# Patient Record
Sex: Female | Born: 1945 | Race: White | Hispanic: No | Marital: Married | Smoking: Former smoker
Health system: Southern US, Community
[De-identification: ages and names within clinical notes are randomized; demographics above are authoritative.]

## PROBLEM LIST (undated history)

## (undated) DIAGNOSIS — K219 Gastro-esophageal reflux disease without esophagitis: Secondary | ICD-10-CM

## (undated) DIAGNOSIS — G709 Myoneural disorder, unspecified: Secondary | ICD-10-CM

## (undated) DIAGNOSIS — I2510 Atherosclerotic heart disease of native coronary artery without angina pectoris: Secondary | ICD-10-CM

## (undated) DIAGNOSIS — J189 Pneumonia, unspecified organism: Secondary | ICD-10-CM

## (undated) DIAGNOSIS — M1990 Unspecified osteoarthritis, unspecified site: Secondary | ICD-10-CM

## (undated) DIAGNOSIS — J449 Chronic obstructive pulmonary disease, unspecified: Secondary | ICD-10-CM

## (undated) HISTORY — DX: Chronic obstructive pulmonary disease, unspecified: J44.9

## (undated) HISTORY — PX: CATARACT EXTRACTION: SUR2

## (undated) HISTORY — PX: APPENDECTOMY: SHX54

## (undated) HISTORY — PX: ABDOMINAL HYSTERECTOMY: SHX81

## (undated) HISTORY — DX: Unspecified osteoarthritis, unspecified site: M19.90

## (undated) HISTORY — PX: CHOLECYSTECTOMY: SHX55

---

## 1970-06-15 DIAGNOSIS — C801 Malignant (primary) neoplasm, unspecified: Secondary | ICD-10-CM

## 1970-06-15 HISTORY — DX: Malignant (primary) neoplasm, unspecified: C80.1

## 1997-06-15 HISTORY — PX: KNEE ARTHROSCOPY: SUR90

## 1998-02-12 ENCOUNTER — Emergency Department (HOSPITAL_COMMUNITY): Admission: EM | Admit: 1998-02-12 | Discharge: 1998-02-13 | Payer: Self-pay | Admitting: Emergency Medicine

## 1998-02-18 ENCOUNTER — Encounter: Admission: RE | Admit: 1998-02-18 | Discharge: 1998-02-18 | Payer: Self-pay | Admitting: *Deleted

## 1998-02-19 ENCOUNTER — Encounter: Admission: RE | Admit: 1998-02-19 | Discharge: 1998-05-20 | Payer: Self-pay | Admitting: Internal Medicine

## 1998-02-27 ENCOUNTER — Encounter: Payer: Self-pay | Admitting: Internal Medicine

## 1998-04-23 ENCOUNTER — Other Ambulatory Visit: Admission: RE | Admit: 1998-04-23 | Discharge: 1998-04-23 | Payer: Self-pay | Admitting: Obstetrics and Gynecology

## 1999-06-21 ENCOUNTER — Emergency Department (HOSPITAL_COMMUNITY): Admission: EM | Admit: 1999-06-21 | Discharge: 1999-06-21 | Payer: Self-pay | Admitting: Emergency Medicine

## 1999-06-21 ENCOUNTER — Encounter: Payer: Self-pay | Admitting: Emergency Medicine

## 1999-06-23 ENCOUNTER — Inpatient Hospital Stay (HOSPITAL_COMMUNITY): Admission: EM | Admit: 1999-06-23 | Discharge: 1999-06-26 | Payer: Self-pay | Admitting: Emergency Medicine

## 1999-06-23 ENCOUNTER — Encounter: Payer: Self-pay | Admitting: Emergency Medicine

## 1999-07-04 ENCOUNTER — Encounter: Admission: RE | Admit: 1999-07-04 | Discharge: 1999-07-04 | Payer: Self-pay | Admitting: Family Medicine

## 1999-10-13 ENCOUNTER — Encounter: Admission: RE | Admit: 1999-10-13 | Discharge: 1999-10-13 | Payer: Self-pay | Admitting: Family Medicine

## 1999-10-13 ENCOUNTER — Encounter: Payer: Self-pay | Admitting: Family Medicine

## 1999-10-20 ENCOUNTER — Encounter: Admission: RE | Admit: 1999-10-20 | Discharge: 1999-10-20 | Payer: Self-pay | Admitting: Family Medicine

## 1999-10-20 ENCOUNTER — Encounter: Payer: Self-pay | Admitting: Family Medicine

## 2000-07-19 ENCOUNTER — Inpatient Hospital Stay (HOSPITAL_COMMUNITY): Admission: EM | Admit: 2000-07-19 | Discharge: 2000-07-21 | Payer: Self-pay | Admitting: Emergency Medicine

## 2000-07-19 ENCOUNTER — Encounter: Payer: Self-pay | Admitting: Emergency Medicine

## 2000-07-21 ENCOUNTER — Encounter: Payer: Self-pay | Admitting: Emergency Medicine

## 2000-10-14 ENCOUNTER — Encounter: Payer: Self-pay | Admitting: Family Medicine

## 2000-10-14 ENCOUNTER — Encounter: Admission: RE | Admit: 2000-10-14 | Discharge: 2000-10-14 | Payer: Self-pay | Admitting: Family Medicine

## 2003-02-05 ENCOUNTER — Other Ambulatory Visit: Admission: RE | Admit: 2003-02-05 | Discharge: 2003-02-05 | Payer: Self-pay | Admitting: Obstetrics and Gynecology

## 2003-08-10 ENCOUNTER — Encounter (INDEPENDENT_AMBULATORY_CARE_PROVIDER_SITE_OTHER): Payer: Self-pay | Admitting: Specialist

## 2003-08-10 ENCOUNTER — Ambulatory Visit (HOSPITAL_COMMUNITY): Admission: RE | Admit: 2003-08-10 | Discharge: 2003-08-10 | Payer: Self-pay | Admitting: Internal Medicine

## 2004-05-09 ENCOUNTER — Ambulatory Visit: Payer: Self-pay | Admitting: Endocrinology

## 2005-03-03 ENCOUNTER — Ambulatory Visit: Payer: Self-pay | Admitting: Internal Medicine

## 2005-03-05 ENCOUNTER — Ambulatory Visit: Payer: Self-pay | Admitting: Cardiology

## 2005-03-09 ENCOUNTER — Ambulatory Visit: Payer: Self-pay

## 2005-03-10 ENCOUNTER — Ambulatory Visit: Payer: Self-pay | Admitting: Internal Medicine

## 2005-03-18 ENCOUNTER — Ambulatory Visit: Payer: Self-pay | Admitting: Internal Medicine

## 2005-04-22 ENCOUNTER — Ambulatory Visit: Payer: Self-pay | Admitting: Internal Medicine

## 2005-04-30 ENCOUNTER — Ambulatory Visit: Payer: Self-pay | Admitting: Internal Medicine

## 2005-04-30 ENCOUNTER — Inpatient Hospital Stay (HOSPITAL_COMMUNITY): Admission: EM | Admit: 2005-04-30 | Discharge: 2005-05-03 | Payer: Self-pay | Admitting: Emergency Medicine

## 2005-05-02 ENCOUNTER — Ambulatory Visit: Payer: Self-pay | Admitting: Internal Medicine

## 2005-05-18 ENCOUNTER — Ambulatory Visit: Payer: Self-pay | Admitting: Internal Medicine

## 2005-08-07 ENCOUNTER — Ambulatory Visit: Payer: Self-pay | Admitting: Internal Medicine

## 2006-02-08 ENCOUNTER — Ambulatory Visit (HOSPITAL_COMMUNITY): Admission: RE | Admit: 2006-02-08 | Discharge: 2006-02-08 | Payer: Self-pay | Admitting: Internal Medicine

## 2006-02-08 ENCOUNTER — Ambulatory Visit: Payer: Self-pay | Admitting: Internal Medicine

## 2006-02-10 ENCOUNTER — Ambulatory Visit: Payer: Self-pay | Admitting: Cardiology

## 2006-03-17 ENCOUNTER — Ambulatory Visit: Payer: Self-pay | Admitting: Internal Medicine

## 2006-03-23 ENCOUNTER — Ambulatory Visit: Payer: Self-pay | Admitting: Internal Medicine

## 2006-10-15 ENCOUNTER — Ambulatory Visit: Payer: Self-pay | Admitting: Internal Medicine

## 2006-12-23 ENCOUNTER — Emergency Department (HOSPITAL_COMMUNITY): Admission: EM | Admit: 2006-12-23 | Discharge: 2006-12-24 | Payer: Self-pay | Admitting: Emergency Medicine

## 2006-12-27 ENCOUNTER — Ambulatory Visit: Payer: Self-pay | Admitting: Internal Medicine

## 2007-01-11 DIAGNOSIS — M1990 Unspecified osteoarthritis, unspecified site: Secondary | ICD-10-CM | POA: Insufficient documentation

## 2007-04-06 ENCOUNTER — Ambulatory Visit: Payer: Self-pay | Admitting: Internal Medicine

## 2007-04-06 ENCOUNTER — Ambulatory Visit: Payer: Self-pay

## 2007-04-20 ENCOUNTER — Telehealth: Payer: Self-pay | Admitting: Internal Medicine

## 2007-05-09 ENCOUNTER — Encounter: Payer: Self-pay | Admitting: Internal Medicine

## 2007-08-11 ENCOUNTER — Ambulatory Visit: Payer: Self-pay | Admitting: Internal Medicine

## 2007-08-11 ENCOUNTER — Other Ambulatory Visit: Payer: Self-pay | Admitting: Internal Medicine

## 2007-08-11 ENCOUNTER — Inpatient Hospital Stay (HOSPITAL_COMMUNITY): Admission: AD | Admit: 2007-08-11 | Discharge: 2007-08-15 | Payer: Self-pay | Admitting: Internal Medicine

## 2007-08-11 ENCOUNTER — Ambulatory Visit: Payer: Self-pay | Admitting: Cardiology

## 2007-08-11 DIAGNOSIS — R51 Headache: Secondary | ICD-10-CM | POA: Insufficient documentation

## 2007-08-11 DIAGNOSIS — G039 Meningitis, unspecified: Secondary | ICD-10-CM | POA: Insufficient documentation

## 2007-08-11 DIAGNOSIS — K219 Gastro-esophageal reflux disease without esophagitis: Secondary | ICD-10-CM

## 2007-08-11 DIAGNOSIS — R519 Headache, unspecified: Secondary | ICD-10-CM | POA: Insufficient documentation

## 2007-08-25 ENCOUNTER — Ambulatory Visit: Payer: Self-pay | Admitting: Internal Medicine

## 2007-10-14 ENCOUNTER — Encounter: Payer: Self-pay | Admitting: Internal Medicine

## 2008-01-23 ENCOUNTER — Telehealth: Payer: Self-pay | Admitting: Internal Medicine

## 2008-01-23 ENCOUNTER — Ambulatory Visit: Payer: Self-pay | Admitting: Internal Medicine

## 2008-02-27 ENCOUNTER — Ambulatory Visit: Payer: Self-pay | Admitting: Internal Medicine

## 2008-02-27 ENCOUNTER — Encounter (INDEPENDENT_AMBULATORY_CARE_PROVIDER_SITE_OTHER): Payer: Self-pay | Admitting: *Deleted

## 2008-02-27 DIAGNOSIS — R062 Wheezing: Secondary | ICD-10-CM

## 2008-02-27 DIAGNOSIS — R5381 Other malaise: Secondary | ICD-10-CM

## 2008-02-27 DIAGNOSIS — R5383 Other fatigue: Secondary | ICD-10-CM

## 2008-02-27 DIAGNOSIS — E785 Hyperlipidemia, unspecified: Secondary | ICD-10-CM | POA: Insufficient documentation

## 2008-02-27 DIAGNOSIS — R252 Cramp and spasm: Secondary | ICD-10-CM

## 2008-02-27 LAB — CONVERTED CEMR LAB
Alkaline Phosphatase: 91 units/L (ref 39–117)
BUN: 12 mg/dL (ref 6–23)
Basophils Relative: 0.7 % (ref 0.0–3.0)
CO2: 29 meq/L (ref 19–32)
Chloride: 109 meq/L (ref 96–112)
Creatinine, Ser: 0.7 mg/dL (ref 0.4–1.2)
Eosinophils Absolute: 0.3 10*3/uL (ref 0.0–0.7)
Eosinophils Relative: 3.8 % (ref 0.0–5.0)
HDL: 38.2 mg/dL — ABNORMAL LOW (ref >39.0)
Hemoglobin: 15.2 g/dL — ABNORMAL HIGH (ref 12.0–15.0)
Lymphocytes Relative: 27.9 % (ref 12.0–46.0)
MCV: 85.6 fL (ref 78.0–100.0)
Neutro Abs: 5.8 10*3/uL (ref 1.4–7.7)
Neutrophils Relative %: 63.3 % (ref 43.0–77.0)
RBC: 5.05 M/uL (ref 3.87–5.11)
TSH: 3.06 microintl units/mL (ref 0.35–5.50)
Total Protein: 7.5 g/dL (ref 6.0–8.3)
WBC: 9.2 10*3/uL (ref 4.5–10.5)

## 2008-02-28 LAB — CONVERTED CEMR LAB: Vit D, 1,25-Dihydroxy: 30 (ref 30–89)

## 2008-03-08 ENCOUNTER — Ambulatory Visit: Payer: Self-pay | Admitting: Internal Medicine

## 2008-03-08 DIAGNOSIS — F17200 Nicotine dependence, unspecified, uncomplicated: Secondary | ICD-10-CM

## 2008-03-08 DIAGNOSIS — J209 Acute bronchitis, unspecified: Secondary | ICD-10-CM | POA: Insufficient documentation

## 2008-04-27 ENCOUNTER — Telehealth: Payer: Self-pay | Admitting: Internal Medicine

## 2008-05-02 ENCOUNTER — Encounter: Payer: Self-pay | Admitting: Internal Medicine

## 2008-08-01 ENCOUNTER — Ambulatory Visit: Payer: Self-pay | Admitting: Internal Medicine

## 2008-08-01 DIAGNOSIS — J019 Acute sinusitis, unspecified: Secondary | ICD-10-CM

## 2008-08-01 DIAGNOSIS — R21 Rash and other nonspecific skin eruption: Secondary | ICD-10-CM

## 2008-09-10 ENCOUNTER — Telehealth: Payer: Self-pay | Admitting: Internal Medicine

## 2009-07-26 ENCOUNTER — Ambulatory Visit: Payer: Self-pay | Admitting: Internal Medicine

## 2009-07-26 DIAGNOSIS — J449 Chronic obstructive pulmonary disease, unspecified: Secondary | ICD-10-CM

## 2009-07-31 ENCOUNTER — Telehealth: Payer: Self-pay | Admitting: Internal Medicine

## 2009-08-30 ENCOUNTER — Encounter (INDEPENDENT_AMBULATORY_CARE_PROVIDER_SITE_OTHER): Payer: Self-pay | Admitting: *Deleted

## 2010-01-31 ENCOUNTER — Telehealth: Payer: Self-pay | Admitting: Internal Medicine

## 2010-04-25 ENCOUNTER — Ambulatory Visit: Payer: Self-pay | Admitting: Internal Medicine

## 2010-04-25 DIAGNOSIS — R209 Unspecified disturbances of skin sensation: Secondary | ICD-10-CM | POA: Insufficient documentation

## 2010-05-11 ENCOUNTER — Emergency Department (HOSPITAL_COMMUNITY)
Admission: EM | Admit: 2010-05-11 | Discharge: 2010-05-11 | Payer: Self-pay | Source: Home / Self Care | Admitting: Family Medicine

## 2010-05-16 ENCOUNTER — Encounter: Admission: RE | Admit: 2010-05-16 | Discharge: 2010-05-16 | Payer: Self-pay | Admitting: Sports Medicine

## 2010-05-17 ENCOUNTER — Telehealth: Payer: Self-pay | Admitting: Family Medicine

## 2010-05-17 ENCOUNTER — Emergency Department (HOSPITAL_COMMUNITY)
Admission: EM | Admit: 2010-05-17 | Discharge: 2010-05-17 | Payer: Self-pay | Source: Home / Self Care | Admitting: Emergency Medicine

## 2010-05-19 ENCOUNTER — Telehealth: Payer: Self-pay | Admitting: Internal Medicine

## 2010-05-20 ENCOUNTER — Ambulatory Visit: Payer: Self-pay | Admitting: Internal Medicine

## 2010-05-20 LAB — CONVERTED CEMR LAB
ANA Titer 1: NEGATIVE
Anti Nuclear Antibody(ANA): POSITIVE — AB
Rhuematoid fact SerPl-aCnc: 21 intl units/mL — ABNORMAL HIGH (ref 0–20)

## 2010-05-21 LAB — CONVERTED CEMR LAB
AST: 23 units/L (ref 0–37)
Albumin: 4 g/dL (ref 3.5–5.2)
Alkaline Phosphatase: 119 units/L — ABNORMAL HIGH (ref 39–117)
Basophils Absolute: 0 10*3/uL (ref 0.0–0.1)
Basophils Relative: 0.5 % (ref 0.0–3.0)
Bilirubin, Direct: 0 mg/dL (ref 0.0–0.3)
Calcium: 9.6 mg/dL (ref 8.4–10.5)
GFR calc non Af Amer: 84.23 mL/min (ref >60.00)
Hemoglobin: 15 g/dL (ref 12.0–15.0)
Ketones, ur: NEGATIVE mg/dL
Leukocytes, UA: NEGATIVE
Lymphocytes Relative: 37.2 % (ref 12.0–46.0)
Monocytes Relative: 6.5 % (ref 3.0–12.0)
Neutro Abs: 4.4 10*3/uL (ref 1.4–7.7)
Neutrophils Relative %: 51.8 % (ref 43.0–77.0)
Nitrite: NEGATIVE
RBC: 4.98 M/uL (ref 3.87–5.11)
RDW: 13.4 % (ref 11.5–14.6)
Sodium: 137 meq/L (ref 135–145)
Specific Gravity, Urine: 1.025 (ref 1.000–1.030)
Uric Acid, Serum: 5 mg/dL (ref 2.4–7.0)
Urobilinogen, UA: 0.2 (ref 0.0–1.0)
pH: 5.5 (ref 5.0–8.0)

## 2010-05-23 ENCOUNTER — Ambulatory Visit: Payer: Self-pay | Admitting: Internal Medicine

## 2010-05-23 DIAGNOSIS — R61 Generalized hyperhidrosis: Secondary | ICD-10-CM

## 2010-05-28 ENCOUNTER — Ambulatory Visit: Payer: Self-pay | Admitting: Internal Medicine

## 2010-05-29 ENCOUNTER — Telehealth: Payer: Self-pay | Admitting: Internal Medicine

## 2010-05-29 LAB — CONVERTED CEMR LAB
BUN: 18 mg/dL (ref 6–23)
Basophils Absolute: 0 10*3/uL (ref 0.0–0.1)
CO2: 29 meq/L (ref 19–32)
Calcium: 9.4 mg/dL (ref 8.4–10.5)
Creatinine, Ser: 0.7 mg/dL (ref 0.4–1.2)
Eosinophils Absolute: 0.2 10*3/uL (ref 0.0–0.7)
GFR calc non Af Amer: 97.82 mL/min (ref >60.00)
Glucose, Bld: 90 mg/dL (ref 70–99)
Lymphocytes Relative: 32.9 % (ref 12.0–46.0)
MCHC: 34.6 g/dL (ref 30.0–36.0)
Monocytes Relative: 5.6 % (ref 3.0–12.0)
Neutro Abs: 5.9 10*3/uL (ref 1.4–7.7)
Platelets: 255 10*3/uL (ref 150.0–400.0)
RDW: 13.8 % (ref 11.5–14.6)
Sed Rate: 27 mm/hr — ABNORMAL HIGH (ref 0–22)

## 2010-06-02 ENCOUNTER — Ambulatory Visit: Payer: Self-pay | Admitting: Internal Medicine

## 2010-07-06 ENCOUNTER — Encounter: Payer: Self-pay | Admitting: Internal Medicine

## 2010-07-13 LAB — CONVERTED CEMR LAB
ALT: 20 units/L (ref 0–35)
Alkaline Phosphatase: 97 units/L (ref 39–117)
Basophils Relative: 0.6 % (ref 0.0–3.0)
Bilirubin, Direct: 0.1 mg/dL (ref 0.0–0.3)
Chloride: 108 meq/L (ref 96–112)
Creatinine, Ser: 0.7 mg/dL (ref 0.4–1.2)
Direct LDL: 127.8 mg/dL
Eosinophils Relative: 3.6 % (ref 0.0–5.0)
Leukocytes, UA: NEGATIVE
Lymphocytes Relative: 30.9 % (ref 12.0–46.0)
MCV: 89.4 fL (ref 78.0–100.0)
Monocytes Absolute: 0.6 10*3/uL (ref 0.1–1.0)
Neutrophils Relative %: 58.1 % (ref 43.0–77.0)
Nitrite: NEGATIVE
RBC: 4.5 M/uL (ref 3.87–5.11)
Sodium: 143 meq/L (ref 135–145)
Specific Gravity, Urine: <=1.005 (ref 1.000–1.030)
TSH: 3.77 microintl units/mL (ref 0.35–5.50)
Total Protein: 7.1 g/dL (ref 6.0–8.3)
WBC: 9.1 10*3/uL (ref 4.5–10.5)
pH: 5.5 (ref 5.0–8.0)

## 2010-07-14 ENCOUNTER — Telehealth: Payer: Self-pay | Admitting: Internal Medicine

## 2010-07-15 NOTE — Progress Notes (Signed)
Summary: RF  Phone Note Refill Request Call back at [phone]   Refills Requested: Medication #1:  PREMARIN 0.625 MG  TABS Take 1 tablet by mouth once a day  Medication #2:  AMBIEN 10 MG  TABS 1 po qhs as needed [facility]  Initial call taken by: Lamar Sprinkles, CMA,  January 31, 2010 1:36 PM  Follow-up for Phone Call        ok 3 ref on both Keep return office visit  Follow-up by: Tresa Garter MD,  January 31, 2010 2:17 PM    Prescriptions: PREMARIN 0.625 MG  TABS (ESTROGENS CONJUGATED) Take 1 tablet by mouth once a day  #30 Tablet x 3   Entered by:   Rock Nephew CMA   Authorized by:   Tresa Garter MD   Signed by:   Rock Nephew CMA on 01/31/2010   Method used:   Telephoned to ...       [facility] [address] [phone]* (retail)       [address]       [place], [place]  [address]       Ph: [phone] or [phone]       Fax: [phone]   RxID:   [id number] AMBIEN 10 MG  TABS (ZOLPIDEM TARTRATE) 1 po qhs as needed  #30 x 3   Entered by:   Rock Nephew CMA   Authorized by:   Tresa Garter MD   Signed by:   Rock Nephew CMA on 01/31/2010   Method used:   Telephoned to ...       [facility] [address] [phone]* (retail)       [address]       [place], [place]  [address]       Ph: [phone] or [phone]       Fax: [phone]   RxID:   [id number]

## 2010-07-15 NOTE — Progress Notes (Signed)
Summary: Test results  Phone Note Call from Patient Call back at [phone]   Caller: Patient Call For: Tresa Garter MD Summary of Call: Pt states she has chest xray &lab work 2/11 and has not rec'd results yet. Please advise. Initial call taken by: Verdell Face,  July 31, 2009 3:05 PM  Follow-up for Phone Call        Patient notified. Follow-up by: Lucious Groves,  July 31, 2009 3:15 PM
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
## 2010-07-15 NOTE — Progress Notes (Signed)
Summary: abnl MRI result  Phone Note Outgoing Call   Summary of Call: Call from [facility] at 1:45pm regarding abnl C spine MRI obtained for paresthesias/neck and arm pain x 2 wks as well as R leg pain - concern on imaging for prevertebral infection/edema, but no vertebral body/disc space association.  Spoke with patient, denies fevers/chills.  Does endorse feeling more tired recently.  Given possibility of deep space infection of back, recommended patient go to ER for further evaluation, likely blood cultures, admission for IV abx.  Called [facility] and spoke with Dr. Marisa Cyphers? advising him pt on her way. Initial call taken by: Eustaquio Boyden  MD,  May 17, 2010 3:05 PM       Allergies: 1)  ! Codeine Sulfate (Codeine Sulfate) 2)  ! Adult Aspirin Low Strength (Aspirin) 3)  ! Nabumetone (Nabumetone)    Appended Document: abnl MRI result reviewed Echart and ED physician record - pt seen and evaluated in ER, normal CBC. recommended admission for IV abx and ID consult, pt decided to leave AMA, desires to f/u with PCP. Will route to PCP.

## 2010-07-15 NOTE — Progress Notes (Signed)
Summary: IV ABX  Phone Note Call from Patient   Summary of Call: See phone note in EMR. Pt had blood cultures at the ER. She does not want to be admitted for IV antibiotics and pt is req that Dr Posey Rea set her up for in home IV antibiotics (don't think this is possible). Her reason for this request is that she says the hosptial is too stressful. Please advise - pt also req call back from MD.  Initial call taken by: Lamar Sprinkles, CMA,  May 19, 2010 11:00 AM  Follow-up for Phone Call        OV with any MD if sick  Follow-up by: Tresa Garter MD,  May 19, 2010 1:25 PM  Additional Follow-up for Phone Call Additional follow up Details #1::        Pt scheduled for office visit tomorrow w/Plot. Echart shows blood cultures neg as of now.  Additional Follow-up by: Lamar Sprinkles, CMA,  May 19, 2010 4:37 PM    Additional Follow-up for Phone Call Additional follow up Details #2::    ov today Follow-up by: Tresa Garter MD,  May 20, 2010 6:07 PM
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
## 2010-07-15 NOTE — Letter (Signed)
Summary: Previsit letter  [facility]  [address] [place], [place] [address]   Phone: [phone]  Fax: [phone]       08/30/2009 MRN: [id number]  [facility] [address] [place], [place]  [address]  Dear Ms. Brosnahan,  Welcome to the Gastroenterology Division at [facility].    You are scheduled to see a nurse for your pre-procedure visit on 09/27/2009 at 9:00AM on the 3rd floor at [facility], [address]  We ask that you try to arrive at our office 15 minutes prior to your appointment time to allow for check-in.  Your nurse visit will consist of discussing your medical and surgical history, your immediate family medical history, and your medications.    Please bring a complete list of all your medications or, if you prefer, bring the medication bottles and we will list them.  We will need to be aware of both prescribed and over the counter drugs.  We will need to know exact dosage information as well.  If you are on blood thinners (Coumadin, Plavix, Aggrenox, Ticlid, etc.) please call our office today/prior to your appointment, as we need to consult with your physician about holding your medication.   Please be prepared to read and sign documents such as consent forms, a financial agreement, and acknowledgement forms.  If necessary, and with your consent, a friend or relative is welcome to sit-in on the nurse visit with you.  Please bring your insurance card so that we may make a copy of it.  If your insurance requires a referral to see a specialist, please bring your referral form from your primary care physician.  No co-pay is required for this nurse visit.     If you cannot keep your appointment, please call [phone] to cancel or reschedule prior to your appointment date.  This allows [place] the opportunity to schedule an appointment for another patient in need of care.    Thank you for choosing [facility] for your  medical needs.  We appreciate the opportunity to care for you.  Please visit [place] at our website  to learn more about our practice.                     Sincerely.                                                                                                                   The Gastroenterology Division

## 2010-07-15 NOTE — Letter (Signed)
Summary: Referral - not able to see patient  [facility]  [address] [place], [place] [address]   Phone: [phone]  Fax: [phone]    August 30, 2009    Georgina Quint. Plotnikov, M.D. [address] [place], [place] [address]   ReCHANLEY MCENERY DOB:  Nov 04, 1945 MRN:   [id number]    Dear Dr. Posey Rea:  Thank you for your kind referral of the above patient.  We have attempted to schedule the recommended procedure Screening Colonoscopy but have not been able to schedule because:   X  The patient was not available by phone and/or has not returned our calls.  ___ The patient declined to schedule the procedure at this time.  We appreciate the referral and hope that we will have the opportunity to treat this patient in the future.    Sincerely,    [facility] [phone]
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
## 2010-07-17 NOTE — Progress Notes (Signed)
Summary: Rf Ambien  Phone Note Refill Request Message from:  Fax from Pharmacy  Refills Requested: Medication #1:  AMBIEN 10 MG  TABS 1 po qhs as needed   Dosage confirmed as above?Dosage Confirmed   Supply Requested: 30   Last Refilled: 04/29/2010 Was pt given written on 05-23-10?   Method Requested: Telephone to Pharmacy Next Appointment Scheduled: 06-02-10 Initial call taken by: Lanier Prude, [facility]),  May 29, 2010 8:07 AM  Follow-up for Phone Call        I don't remember  OK to ref Does she have Rheum and ID appts? Follow-up by: Tresa Garter MD,  May 29, 2010 12:47 PM  Additional Follow-up for Phone Call Additional follow up Details #1::        Rx called to pharmacy. PCCs are still waiting on appts with ID and rheumatology. pt informed  Additional Follow-up by: Lanier Prude, [facility]),  May 29, 2010 2:34 PM

## 2010-07-23 ENCOUNTER — Ambulatory Visit: Payer: Self-pay | Admitting: Infectious Disease

## 2010-07-23 NOTE — Progress Notes (Signed)
Summary: REFERRAL  Phone Note Call from Patient Call back at Home Phone [phone]   Summary of Call: Pt continues to c/o pain & numbness in hands & arms. She is req referral to ortho.  Initial call taken by: Lamar Sprinkles, CMA,  July 14, 2010 3:22 PM  Follow-up for Phone Call        She had a Rheum and ID ref - Did she go? Follow-up by: Tresa Garter MD,  July 14, 2010 6:16 PM  Additional Follow-up for Phone Call Additional follow up Details #1::        Checked on referral status - Pt told both ID & Rheumatology that she did not need the apt.    Who  said she does not need appts? Georgina Quint Nylee Barbuto MD  July 15, 2010 5:38 PM  Additional Follow-up by: Lamar Sprinkles, CMA,  July 15, 2010 2:16 PM    Additional Follow-up for Phone Call Additional follow up Details #2::    The patient told both that she did not need the apt. Left vm for pt that these were important and to please call our office as to why she did not make/keep these apts.................Marland KitchenLamar Sprinkles, CMA  July 15, 2010 6:41 PM   left vm same as above.....................Marland KitchenLamar Sprinkles, CMA  July 16, 2010 5:53 PM   Spoke w/pt. She declined apt's b/c she wanted to see if the meds given at office visit would work. She said she is willing to schedule w/both.  ID referral will attempt to call patient again. Rheumatology will need a new referral entered into EMR please...............Marland KitchenLamar Sprinkles, CMA  July 17, 2010 11:02 AM   Additional Follow-up for Phone Call Additional follow up Details #3:: Details for Additional Follow-up Action Taken: OK. Thank you!  Additional Follow-up by: Tresa Garter MD,  July 17, 2010 1:16 PM

## 2010-07-25 ENCOUNTER — Other Ambulatory Visit: Payer: Self-pay | Admitting: Infectious Disease

## 2010-07-25 ENCOUNTER — Encounter: Payer: Self-pay | Admitting: Infectious Disease

## 2010-07-25 ENCOUNTER — Ambulatory Visit (INDEPENDENT_AMBULATORY_CARE_PROVIDER_SITE_OTHER): Payer: PRIVATE HEALTH INSURANCE | Admitting: Infectious Disease

## 2010-07-25 DIAGNOSIS — R209 Unspecified disturbances of skin sensation: Secondary | ICD-10-CM

## 2010-07-25 DIAGNOSIS — M519 Unspecified thoracic, thoracolumbar and lumbosacral intervertebral disc disorder: Secondary | ICD-10-CM | POA: Insufficient documentation

## 2010-07-25 DIAGNOSIS — M4644 Discitis, unspecified, thoracic region: Secondary | ICD-10-CM

## 2010-07-25 DIAGNOSIS — M4640 Discitis, unspecified, site unspecified: Secondary | ICD-10-CM

## 2010-07-25 DIAGNOSIS — R61 Generalized hyperhidrosis: Secondary | ICD-10-CM

## 2010-07-25 DIAGNOSIS — M542 Cervicalgia: Secondary | ICD-10-CM

## 2010-07-25 LAB — CONVERTED CEMR LAB
BUN: 16 mg/dL (ref 6–23)
CO2: 27 meq/L (ref 19–32)
Creatinine, Ser: 0.67 mg/dL (ref 0.40–1.20)
Crypto Ag: NEGATIVE
Eosinophils Absolute: 0.3 10*3/uL (ref 0.0–0.7)
Eosinophils Relative: 4 % (ref 0–5)
Glucose, Bld: 97 mg/dL (ref 70–99)
HCT: 45 % (ref 36.0–46.0)
Hemoglobin: 15.1 g/dL — ABNORMAL HIGH (ref 12.0–15.0)
Lymphs Abs: 3.1 10*3/uL (ref 0.7–4.0)
MCV: 86.9 fL (ref 78.0–100.0)
Monocytes Relative: 8 % (ref 3–12)
RBC: 5.18 M/uL — ABNORMAL HIGH (ref 3.87–5.11)
Total Bilirubin: 0.3 mg/dL (ref 0.3–1.2)
WBC: 8.6 10*3/uL (ref 4.0–10.5)
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
## 2010-08-01 ENCOUNTER — Ambulatory Visit
Admission: RE | Admit: 2010-08-01 | Discharge: 2010-08-01 | Disposition: A | Discharge status: Home / Self Care | Payer: PRIVATE HEALTH INSURANCE | Source: Ambulatory Visit | Attending: Infectious Disease | Admitting: Infectious Disease

## 2010-08-01 DIAGNOSIS — M4644 Discitis, unspecified, thoracic region: Secondary | ICD-10-CM

## 2010-08-01 MED ORDER — GADOBENATE DIMEGLUMINE 529 MG/ML IV SOLN
11.0000 mL | Freq: Once | INTRAVENOUS | Status: AC | PRN
  Administered 2010-08-01: 11 mL via INTRAVENOUS

## 2010-08-08 ENCOUNTER — Other Ambulatory Visit (HOSPITAL_COMMUNITY): Payer: PRIVATE HEALTH INSURANCE

## 2010-08-23 ENCOUNTER — Encounter: Payer: Self-pay | Admitting: *Deleted

## 2010-08-26 LAB — DIFFERENTIAL
Basophils Relative: 0 % (ref 0–1)
Eosinophils Absolute: 0.2 10*3/uL (ref 0.0–0.7)
Eosinophils Relative: 3 % (ref 0–5)
Monocytes Relative: 7 % (ref 3–12)
Neutrophils Relative %: 55 % (ref 43–77)

## 2010-08-26 LAB — BASIC METABOLIC PANEL
Chloride: 102 mEq/L (ref 96–112)
GFR calc Af Amer: >60 mL/min (ref >60)
GFR calc non Af Amer: >60 mL/min (ref >60)
Potassium: 3.8 mEq/L (ref 3.5–5.1)
Sodium: 136 mEq/L (ref 135–145)

## 2010-08-26 LAB — CULTURE, BLOOD (ROUTINE X 2)
Culture  Setup Time: 201112040144
Culture: NO GROWTH

## 2010-08-26 LAB — CBC
HCT: 41.3 % (ref 36.0–46.0)
Hemoglobin: 14.2 g/dL (ref 12.0–15.0)
MCV: 86.8 fL (ref 78.0–100.0)
RBC: 4.76 MIL/uL (ref 3.87–5.11)
WBC: 7.2 10*3/uL (ref 4.0–10.5)

## 2010-09-17 ENCOUNTER — Other Ambulatory Visit: Payer: Self-pay | Admitting: Internal Medicine

## 2010-09-17 NOTE — Telephone Encounter (Signed)
Ok to rf? 

## 2010-09-22 NOTE — Telephone Encounter (Signed)
Pt left vm req Rf of ambien AND tramadol. Please advise.

## 2010-09-22 NOTE — Telephone Encounter (Signed)
OK to fill this prescription with additional refills x3 Thank you!  

## 2010-09-23 MED ORDER — ZOLPIDEM TARTRATE 10 MG PO TABS: 10.0000 mg | ORAL_TABLET | Freq: Every evening | ORAL | Status: DC | PRN

## 2010-09-23 NOTE — Telephone Encounter (Signed)
Ok to Rf? 

## 2010-09-23 NOTE — Telephone Encounter (Signed)
rf phoned in 

## 2010-10-31 NOTE — H&P (Signed)
[employer] [facility]  Patient:    Debra Farmer                 MRN: [id number] Adm. Date:  [id number] Attending:  McDiarmid, Leighton Roach. Dictator:   Wilfrid Lund, M.D. CC:         Maricela Bo, M.D.                         History and Physical  ADMISSION DIAGNOSIS:  Community-acquired pneumonia, oxygen requiring; hypoxia; failed outpatient treatment.  SERVICE:  Conservation officer, historic buildings.  PRIMARY CARE PHYSICIAN:  [facility]  PROBLEM LIST: 1. Community-acquired pneumonia/hypoxia. 2. Tobacco abuse. 3. Post menopausal. 4. Status post cholecystectomy. 5. Status post knee surgery. 6. Status post hysterectomy. 7. Status post appendectomy.  HISTORY OF PRESENT ILLNESS:  Patient was seen in [facility] ER two days ago and was diagnosed with a community-acquired pneumonia.  Patient was started at that time on a Z-pack, Tessalon pearls, and prednisone.  Since that time, patient has had no  improvement.  In fact, feels that she has worsened somewhat.  She has been feeling subjectively short of breath.  She reports an overall one-week history of cough, subjective fever, posttussive vomiting, and some diarrhea.  The cough is productive of a "mucousy" phlegm.  She has a sore throat and a chest "burning" ith coughing.  Also complains of generalized body aches.  REVIEW OF SYSTEMS:  Overall negative, except for occasional blurred vision and epigastric pain.  PAST MEDICAL HISTORY:  See problem list.  MEDICATIONS:  Premarin.  ALLERGIES:  CODEINE causes hallucinations.  SOCIAL HISTORY:  Smokes one-half a pack a day, quit four days ago.  No alcohol, no illicit drug use.  Lives in [place] with her husband.  PHYSICAL EXAMINATION:  VITAL SIGNS:  Temperature 98.5, blood pressure 100/56, respiratory rate 32, pulse oximetry 91-94% on 2 liters nasal cannula, pulse is 68.  GENERAL:  Patient is uncomfortable, in mild respiratory distress,  and coughing throughout exam.  HEENT:  Pupils equally round and reactive to light, extraocular muscles intact.  Tympanic membranes slightly injected bilaterally.  Throat is clear.  NECK:  No lymphadenopathy, no thyromegaly.  HEART:  Regular rate and rhythm without murmurs, rubs, or gallops.  LUNGS:  Rhonchi throughout lung fields bilaterally, crackles bilaterally throughout, and scattered expiratory wheezes.  ABDOMEN:  Nontender, good bowel sounds, slight tenderness to palpation in the epigastric region, likely muscular.  EXTREMITIES:  No clubbing, cyanosis, or edema.  NEUROLOGIC:  Nonfocal.  LABORATORY:  WBC of 12.3, hemoglobin 11.5, platelets 319, absolute neutrophil count of 10.5.  Sodium 138, potassium 3.6, chloride 106, CO2 27, BUN 11, creatinine 0.7, and glucose 159.  UA was negative.  Chest x-ray shows bilateral hilar pneumonia versus pneumonitis versus sarcoid.  ASSESSMENT AND PLAN:  A 65 year old Caucasian female with community-acquired pneumonia, hypoxia, status post failure of outpatient treatment.  1. Pneumonia.  As patient has failed outpatient treatment with Z-pack alone, will    start IV Rocephin and azithromycin to cover most community-acquired    pathogens.  Will continue patients prednisone burst to decrease the amount    of inflammation and reactive airway from this infection.  Patient will    receive albuterol nebulizers q.4h. as needed.  Will continue nasal cannula O2    to keep her O2 saturations above 93%.  Will hydrate patient with D5 one-half    normal saline, given subjective fevers at home  and in order to keep patient    well hydrated to keep secretions loose.  Will not use any codeine at current    time for cough suppressant, given previous bad reaction to that medication. 2. Tobacco abuse.  As patient has quit four days ago as a result of this illness,    stress to patient that these illnesses will become more frequent should she    continue  to smoke.  Patient voices understanding, and vows to cease tobacco    use.  States that she will not go back to smoking after this.  Encouraged her    in this effort. DD:  06/23/99 TD:  06/24/99 Job: [id number]

## 2010-10-31 NOTE — Discharge Summary (Signed)
NAMEJARELY, JUNCAJ             ACCOUNT NO.:  [id number]   MEDICAL RECORD NO.:  [id number]          PATIENT TYPE:  INP   LOCATION:  1522                         FACILITY:  [facility]   PHYSICIAN:  Rosalyn Gess. Norins, MD  DATE OF BIRTH:  March 26, 1946   DATE OF ADMISSION:  08/11/2007  DATE OF DISCHARGE:  08/15/2007                               DISCHARGE SUMMARY   ADMITTING DIAGNOSIS:  Meningitis.   DISCHARGE DIAGNOSIS:  Meningitis.   HISTORY OF PRESENT ILLNESS:  The patient presented to the office on the  day of admission with the acute onset of a headache, global in  distribution, rated as the worst headache of her life.  She had nausea,  chills, low grade fever.  She had no blurred vision, but she was dizzy.  She had no prior history of headache.  As an outpatient, CT scan of the  brain was performed which was read out as normal.  She then had an  outpatient lumbar puncture which revealed 131 WBCs per high-powered  field with 39% segs, 36% lymphs, 29% monos, 7 RBCs, CFS glucose was 58,  and protein was 95.  The patient was admitted for IV antibiotics for  treatment of possible bacterial meningitis.   Please see the admission note for past medical history, family history,  social history, and of exam.   HOSPITAL COURSE:  The patient was started on Rocephin initially with 2 g  IV then 1 g q. 24.  This was converted to oral Ceftin on February 28.  Over February 28, February 29 to February 28, March 1, to March 2, the  patient remained stable with resolution of her headache.  It was felt by  Dr. Thomos Lemons that this may actually be aseptic meningitis.  However,  it was felt to be prudent to continue her on oral antibiotics.  With the  patient's headache resolved, with her white count being normal at 7800  with no fever, she was stable for discharge home to complete an  outpatient course of Ceftin 250 mg b.i.d.   DISCHARGE EXAMINATION:  Temperature was 97, blood pressure 110/50, heart  rate 76, respirations 20, O2 saturations 97% on room air.  GENERAL APPEARANCE:  Awake, alert, in no distress.  CARDIOVASCULAR:  Unremarkable.  LUNGS:  Clear.  ABDOMEN:  Soft.  EXTREMITIES:  Without swelling, edema, and no deformities were noted.  NEUROLOGIC:  The patient awake, alert, oriented to person, place, time,  and context with no focal deficits.   FINAL LABORATORY:  Hemoglobin 11.9 g, white count 7800, platelet count  214,000.  Chemistries were unremarkable with potassium of 3.3,  creatinine 0.7, sodium of 140.  PCR, HSV was pending at time of  discharge dictation.   DISPOSITION:  The patient is discharged home.  She was to continue on:  1. Aspirin 81 mg daily  2. Toradol 10 mg t.i.d. p.r.n.  3. Ceftin 250 mg b.i.d. x3 additional days.   She was to see her primary care physician, Dr. Posey Rea, for followup  as needed.  The patient's condition at time of discharge dictation was  stable and improved  Rosalyn Gess Norins, MD  Electronically Signed     MEN/MEDQ  D:  09/09/2007  T:  09/10/2007  Job:  [id number]

## 2010-10-31 NOTE — Discharge Summary (Signed)
NAMEALEXIA, DINGER             ACCOUNT NO.:  [id number]   MEDICAL RECORD NO.:  [id number]          PATIENT TYPE:  INP   LOCATION:  5708                         FACILITY:  [facility]   PHYSICIAN:  Rene Paci, M.D. LHCDATE OF BIRTH:  Jun 25, 1945   DATE OF ADMISSION:  04/30/2005  DATE OF DISCHARGE:  05/03/2005                                 DISCHARGE SUMMARY   DISCHARGE DIAGNOSIS:  Community acquired pneumonia with chronic obstructive  pulmonary disease exacerbation.   HISTORY OF PRESENT ILLNESS:  Patient is a 65 year old female admitted with  mild hypoxia and possible bronchial pneumonia which was refractory to  outpatient treatment.  She was admitted for further evaluation and  treatment.   PAST MEDICAL HISTORY:  1.  Insomnia.  2.  COPD.  3.  Hyperlipidemia.   HOSPITAL COURSE:  COMMUNITY ACQUIRED PNEUMONIA WITH CHRONIC OBSTRUCTIVE  PULMONARY DISEASE EXACERBATION:  The patient was admitted and underwent a  chest x-ray which showed diffuse interstitial prominence and questionable  atypical infection.  She was started on IV Solu-Medrol which was later  changed to p.o. prednisone.  She was also treated with albuterol and  Atrovent nebulizers and IV Avelox.  The patient continued to improve and was  discharged to home on April 23, 2005.   DISCHARGE LABORATORY DATA:  Hemoglobin 12.5, hematocrit 36.8, white blood  cell count 16.4, BUN 11, creatinine 0.8.   DISCHARGE MEDICATIONS:  1.  Avelox 400 mg p.o. daily for six additional days.  2.  Tussionex syrup 1 teaspoon q.12h. p.r.n. severe cough.  3.  Prednisone 10 mg tablets six tablets on May 04, 2005, five tablets      on May 05, 2005, four tablets on May 06, 2005, three tablets      on May 07, 2005, two tablets on May 08, 2005, then one tablet      on May 09, 2005.  4.  DuoNeb nebulizer three times daily.  5.  Nexium, Premarin, aspirin and Ambien as prior to admission.   FOLLOW UP:  The patient  is instructed to follow up with Dr. Posey Rea in one  to two weeks and call for an appointment.      Melissa S. Peggyann Juba, NP      Rene Paci, M.D. [facility]  Electronically Signed    MSO/MEDQ  D:  06/25/2005  T:  06/26/2005  Job:  [id number]

## 2010-10-31 NOTE — Discharge Summary (Signed)
[place]  Patient:    Debra Farmer, Debra Farmer                MRN: [id number] Adm. Date:  [id number] Disc. Date: [id number] Attending:  Tobin Chad CC:         Burnell Blanks, M.D.   Discharge Summary  ATTENDING PHYSICIAN: Wayne A. Sheffield Slider, M.D.  PRIMARY CARE PHYSICIAN:  Burnell Blanks, M.D. of ___________ [facility].  CONSULTATIONS: 1. Cardiology.  Lori C. Earl Gala, R.N., A.N.P.  PROCEDURES: 1. Adenosine Cardiolite which was negative. 2. Chest CT in the emergency department which revealed resolving bronchitis. No evidence of pulmonary embolus.  There was a right middle lobe 4 mm nodule that was nonspecific, and they recommended follow up chest x-ray in six months.  DISCHARGE DIAGNOSES: 1. Atypical chest pain with no evidence of inducible ischemia. 2. Tobacco abuse. 3. Urinary tract infection. 4. Status post cholecystectomy. 5. Status post appendectomy. 6. Status post total abdominal hysterectomy.  DISCHARGE MEDICATIONS: 1. Premarin 0.625 mg p.o. q.d. 2. Bactrim DS  one tab p.o. b.i.d. x 3 days. 3. Aspirin 325 mg p.o. q.d.  HISTORY OF PRESENT ILLNESS:  Please refer to the admission history and physical for complete details.  In brief, this is a 65 year old Caucasian female who was at work when she had a sudden onset of left arm and substernal chest pain rating a 10/10 in severity, sharp, and stabbing.  The pain did radiate to her neck and brought her to her knees.  She did have nausea and diaphoresis along with shortness of breath but no vomiting or loss of consciousness.  She had no relenting or exacerbating factors, and it did resolve by arrival to the emergency department one hour later.  She has had similar symptoms on July 17, 2000 that lasted about 10 minutes and spontaneously resolved.  Her risk factors include tobacco abuse, post menopausal, history of hypercholesterolemia, and a family history of coronary artery  disease.  LABORATORY DATA: On admission included a BMT which was within normal limits. Sodium was 137, potassium 2.7, chloride 106, bicarbonate 26, BUN 13, creatinine 0.7, glucose 89.  CBC:  White count 10.9, Hemoglobin 15.1, hematocrit 43.5, platelet count 283,000, 54% segs, 39% lymphs, 4% monos. Initial cardiac enzymes, CK was 60 and MB 1.7, troponin less than 0.01.  Chest x-ray revealed no active disease.  Chest CT did reveal resolving edema versus vasculitis, versus bronchitis.  No evidence of PE  or DVT.  There was a right middle lobe, 4 mm nodule that was nonspecific and radiology recommended follow up with a chest x-ray in six months of this.  HOSPITAL COURSE:  A brief hospital course. #1. CHEST PAIN:  The patient was admitted to rule out myocardial infarction with serial enzymes and a repeat EKG.  She was started on aspirin, and she did rule out with three sets of negative enzymes.  Cardiology was consulted given her story, and they recommended risk stratifying the patient with an adenosine Cardiolite.  This was performed on the day of discharge, July 21, 2000, and was found to have no evidence of an inducible ischemia.  Cardiology therefore recommended no further follow up, and it was felt that the chest pain was atypical and could represent either musculoskeletal type pain versus an anxiety component.  #2. TOBACCO ABUSE:  This was discussed with the patient smoking cessation and how this does increase your risk of both coronary artery disease and several other types of cancer.  She did  decline any smoking cessation offers over the course of her hospital stay and continued to smoke.  #3. URINARY TRACT INFECTION:  On admission, thepatient on review of systems did have some complaints of dysuria, frequency, incomplete voids, and malodorous urine.  A urinalysis was obtained on the day of discharge and was sent for culture and sensitivity.  The results of which, at the time  of dictation are pending.  She was started empirically on Bactrim DS for three days.  She was given a prescription for the Bactrim to complete this course.  #4. HYPERCHOLESTEROLEMIA:  The patient did get a fasting lipid profile while in the hospital revealing total cholesterol 240, HDL 35, LDL 138, and triglycerides were 334.  Given her risk factors of smoking, post menopausal, and family history, although question family history is not early cardiac events, the patient would probably benefit from a antihyperlipidemic agent.  I did discuss this with her prior to discharge, and the patient opted to try lifestyle modifications until she is seen by her primary care physician, Dr. Gertha Calkin.  This is something that will need to be followed up.  I did discuss at length low cholesterol diet, including a diet high in fiber and fruits and vegetables along with increasing her daily exercise.  DISCHARGE CONDITION:  Good.  DISPOSITION:  The patient was discharged to home with her family.  ACTIVITY:  The patient was not given any restrictions on activity.  DIET:  She was encouraged to eat a low fat, low cholesterol diet high in fruits, vegetables, and fiber.  FOLLOWUP:  She was scheduled a follow up appointment with Dr. Val Riles at ___________  and [facility] on Wednesday August 04, 2000 at 10 oclock AM. DD:  07/21/00 TD:  07/22/00 Job: [id number]

## 2010-10-31 NOTE — Discharge Summary (Signed)
[place]  Patient:    Debra Farmer, Debra Farmer                  MRN: [id number] Adm. Date:  06/23/99 Disc. Date: 06/26/99 Attending:  Mena Pauls, M.D. Dictator:   Andrey Spearman, M.D. CC:         Burnell Blanks, M.D.                           Discharge Summary  DISCHARGE DIAGNOSES: 1.  Bilateral hilar pneumonia. 2.  Tobacco abuse.  DISCHARGE MEDICATIONS: 1.  Albuterol inhaler 2 puffs q.4h. p.r.n. 2.  Tessalon Perles 1 p.o. t.i.d. for cough. 3.  Premarin as prescribed previously. 4.  Zyban 150 mg q.a.m. x two days, then 150 mg p.o. b.i.d.  HISTORY OF PRESENT ILLNESS:  Please see the fully dictated History and Physical for details.  The patient is a 65 year old female with a past medical history only f prior surgeries including cholecystectomy, appendectomy, hysterectomy, knee surgery, and breast cyst removal, and a social history significant for half pack per day tobacco abuse since approximately age 19.  The patient presented with a one to two week history of cough, shortness of breath, subjective fever, and some vomiting and diarrhea which had resolved upon admission.  The patient had previously been seen in the emergency room and diagnosed as pneumonia, given a Z-pack, prednisone, and Albuterol inhaler; however, the patient had not improved over the two days of treatment and was increasingly short of breath.  HOSPITAL COURSE: #1 - BILATERAL HILAR PNEUMONIA:  The patients chest x-ray on admission showed a  bilateral hilar pneumonia versus pneumonitis versus sarcoid.  The patient was afebrile on admission; however, her lung exam revealed rhonchi throughout, crackles throughout, and scattered expiratory wheezes, and the patient was saturating 91 to 94% on 2 liters by nasal cannula.  The patient was admitted and was started on Rocephin and zithromycin IV.  The patient was continued on prednisone as with the patients history of  tobacco abuse there is a question of underlying reactive airway disease component.  The patient was also given Albuterol nebulizers q.4h. and continued on oxygen by nasal cannula.  She was also given Tessalon Perles for cough.  The patient tolerated a regular diet, was on IV antibiotics x 36 hours,  after which point IV Rocephin was discontinued and the patient was switched to .o. zithromycin.  The patient continued on Albuterol nebulizers q.4h.  Her oxygen was weaned.  On discharge she was on room air, saturating 93 to 94%.  The patient was ambulating without difficulty.  The patient was continued on prednisone for a full five day course and then the prednisone was discontinued.  The patient continued on Tessalon Perles for cough.  On discharge her lung exam revealed her lungs to be  clear to auscultation; however, the patient was still having excessive coughing. The patient also had peak flows done on this admission; pre-bronchodilator were  210, post-bronchodilator were 295.  I still question whether there is an underlying reactive airway disease component to this patients lung exam given her long smoking history.  I think doing pulmonary function tests after the acute pneumonia has resolved would be helpful.  #2 - TOBACCO ABUSE:  The patient had not been smoking since the onset of this illness and upon hospital admission had no desire to smoke.  She was counseled extensively regarding tobacco abuse.  She was agreeable  to starting Zyban and so Zyban was prescribed for her upon discharge.  She will need to be followed up for this as an outpatient.  DISCHARGE CONDITION:  Her condition at discharge was stable.  DISPOSITION:  She will follow up with Dr. Nathanial Rancher at [facility] on June 30, 1999 at 1:15 p.m.DD:  06/26/99 TD:  06/26/99 Job: [id number]

## 2010-10-31 NOTE — H&P (Signed)
Debra Farmer, Debra Farmer             ACCOUNT NO.:  [id number]   MEDICAL RECORD NO.:  [id number]          PATIENT TYPE:  EMS   LOCATION:  [facility]                         FACILITY:  [facility]   PHYSICIAN:  Georgina Quint. Plotnikov, M.D. [facility] OF BIRTH:  April 10, 1946   DATE OF ADMISSION:  04/30/2005  DATE OF DISCHARGE:                                HISTORY & PHYSICAL   CHIEF COMPLAINT:  Cough, shortness of breath, weakness.   HISTORY OF PRESENT ILLNESS:  The patient is a 65 year old female who  presents with complaint of severe cough, fever, shortness of breath of  several days' duration.  She saw me on April 29, 2005 and started on  Robitussin [facility] and doxycycline.  Not feeling any better.  If anything, she is  feeling worse.  Was worked in today.  Was found to have pulse oximetry  between 80-91% on room air.   ALLERGIES:  CODEINE.   CURRENT MEDICATIONS:  1.  Robitussin AC.  2.  Doxycycline.  3.  Premarin 0.625 mg daily.  4.  Nexium 40 mg daily.  5.  Baby aspirin.  6.  Ambien at h.s.   PAST MEDICAL HISTORY:  1.  Insomnia.  2.  COPD of unknown degree.  3.  Dyslipidemia.  4.  Recent chest pain with negative stress test.   SOCIAL HISTORY:  She works at [employer].  She smokes one pack a day.  No  alcohol.  She has three children.   FAMILY HISTORY:  No coronary artery disease.   REVIEW OF SYSTEMS:  No syncope.  Some chest pain still present.  Nauseated  with extreme cough.  Skin clear.  The rest is negative.   PHYSICAL EXAMINATION:  VITAL SIGNS:  Blood pressure 118/71, pulse 91,  temperature 99, temperature 243 pounds.  GENERAL:  She is in mild to moderate distress, appears sick, coughing all  the time.  HEENT:  Erythematous throat, slightly dry.  LUNGS:  Diffuse, extensive rhonchi.  HEART:  S1, S2, tachycardic.  ABDOMEN:  Soft, nontender.  EXTREMITIES:  Lower extremities without edema.  NEUROLOGIC:  She is alert, oriented, cooperative.  No meningeal signs.  SKIN:  Clear.   ASSESSMENT/PLAN:  1.  Possible bronchial pneumonia refractory to outpatient treatment.  Obtain      chest x-ray.  Discontinue doxycycline.  Start intravenous Levaquin 500      mg intravenous daily.  2.  Severe cough.  Will use Tussionex p.r.n.  3.  Hypoxemia due to problem #1.  Will use oxygen.  4.  Chronic obstructive pulmonary disease exacerbation.  Will use      intravenous Solu-Medrol.  5.  Deep venous thrombosis prophylaxis.  Stop Premarin.           ______________________________  Georgina Quint. Plotnikov, M.D. [facility]     AVP/MEDQ  D:  04/30/2005  T:  04/30/2005  Job:  [id number]

## 2010-12-26 ENCOUNTER — Ambulatory Visit (INDEPENDENT_AMBULATORY_CARE_PROVIDER_SITE_OTHER): Payer: PRIVATE HEALTH INSURANCE | Admitting: Internal Medicine

## 2010-12-26 ENCOUNTER — Encounter: Payer: Self-pay | Admitting: Internal Medicine

## 2010-12-26 ENCOUNTER — Telehealth: Payer: Self-pay | Admitting: *Deleted

## 2010-12-26 VITALS — BP 100/68 | HR 88 | Temp 97.5°F | Resp 16 | Ht 60.0 in | Wt 126.0 lb

## 2010-12-26 DIAGNOSIS — M25551 Pain in right hip: Secondary | ICD-10-CM | POA: Insufficient documentation

## 2010-12-26 DIAGNOSIS — M25552 Pain in left hip: Secondary | ICD-10-CM | POA: Insufficient documentation

## 2010-12-26 DIAGNOSIS — M25559 Pain in unspecified hip: Secondary | ICD-10-CM

## 2010-12-26 MED ORDER — METHYLPREDNISOLONE ACETATE 80 MG/ML IJ SUSP: 80.0000 mg | Freq: Once | INTRAMUSCULAR | Status: DC

## 2010-12-26 NOTE — Progress Notes (Signed)
  Subjective:    Patient ID: Debra Farmer, female    DOB: 1946-03-07, 65 y.o.   MRN: [id number]  HPI  C/o R hip pain x 2 wks - worse with sitting, mostly standing at work -- 11/10 in intensity. Irrad down to R outer thigh Review of Systems  Constitutional: Negative for fever.  Musculoskeletal: Positive for arthralgias (R hip) and gait problem. Negative for back pain.  Psychiatric/Behavioral: Positive for sleep disturbance (due to pain). The patient is not nervous/anxious.        Objective:   Physical Exam  Constitutional: She appears well-developed and well-nourished. No distress.  Musculoskeletal: She exhibits tenderness (R hip is very tender; R IT band is tender; LS is ok). She exhibits no edema.          Assessment & Plan:

## 2010-12-26 NOTE — Assessment & Plan Note (Addendum)
Procedure Note :     Procedure : Joint Injection,   R  hip   Indication: R Trochanteric bursitis with refractory  chronic pain.   Risks including unsuccessful procedure , bleeding, infection, bruising, skin atrophy and others were explained to the patient in detail as well as the benefits. Informed consent was obtained and signed.   Tthe patient was placed in a comfortable lateral decubitus position. The point of maximal tenderness was identified. Skin was prepped with Betadine and alcohol. Then, a 5 cc syringe with a 1.5 inch long 24-gauge needle was used for a bursa injection.. The needle was advanced  Into the bursa. I injected the bursa with 4 mL of 2% lidocaine and 80 mg of Depo-Medrol .  Band-Aid was applied.   Tolerated well. Complications: None. Good pain relief following the procedure.   Postprocedure instructions :    A Band-Aid should be left on for 12 hours. Injection therapy is not a cure itself. It is used in conjunction with other modalities. You can use nonsteroidal anti-inflammatories like ibuprofen , hot and cold compresses. Rest is recommended in the next 24 hours. You need to report immediately  if fever, chills or any signs of infection develop.

## 2010-12-26 NOTE — Telephone Encounter (Signed)
OK to fill both prescriptions with additional refills x5 Thank you!  

## 2010-12-26 NOTE — Patient Instructions (Signed)
[email or web address] -- IT band stretch, hip stretching Ice

## 2010-12-26 NOTE — Telephone Encounter (Signed)
Pt is req Rf on Ambien and Tramadol.  Ok To Rf?

## 2010-12-29 MED ORDER — TRAMADOL HCL 50 MG PO TABS: 50.0000 mg | ORAL_TABLET | Freq: Four times a day (QID) | ORAL | Status: DC | PRN

## 2010-12-29 MED ORDER — ZOLPIDEM TARTRATE 10 MG PO TABS: 10.0000 mg | ORAL_TABLET | Freq: Every evening | ORAL | Status: DC | PRN

## 2010-12-29 NOTE — Telephone Encounter (Signed)
Rf phoned in.  

## 2011-03-09 LAB — BASIC METABOLIC PANEL
Calcium: 8.2 — ABNORMAL LOW
Creatinine, Ser: 0.69
GFR calc Af Amer: >60
GFR calc non Af Amer: >60
Sodium: 140

## 2011-03-09 LAB — CSF CULTURE W GRAM STAIN: Culture: NO GROWTH

## 2011-03-09 LAB — CSF CELL COUNT WITH DIFFERENTIAL
Lymphs, CSF: 36 — ABNORMAL LOW
Monocyte-Macrophage-Spinal Fluid: 29
RBC Count, CSF: 7 — ABNORMAL HIGH
Segmented Neutrophils-CSF: 35 — ABNORMAL HIGH
Tube #: 3
WBC, CSF: 131 — ABNORMAL HIGH

## 2011-03-09 LAB — PROTEIN AND GLUCOSE, CSF: Glucose, CSF: 58

## 2011-03-09 LAB — CBC
Hemoglobin: 11.9 — ABNORMAL LOW
RBC: 3.98

## 2011-03-31 LAB — CBC
HCT: 39.2
Hemoglobin: 13.6
MCHC: 34.7
RBC: 4.64
RDW: 13.4

## 2011-03-31 LAB — DIFFERENTIAL
Basophils Absolute: 0
Eosinophils Relative: 3
Lymphocytes Relative: 35
Monocytes Absolute: 0.5
Monocytes Relative: 6
Neutro Abs: 4.6

## 2011-03-31 LAB — BASIC METABOLIC PANEL
CO2: 24
GFR calc Af Amer: >60
GFR calc non Af Amer: >60
Glucose, Bld: 110 — ABNORMAL HIGH
Potassium: 3.6
Sodium: 137

## 2011-06-11 ENCOUNTER — Other Ambulatory Visit: Payer: Self-pay | Admitting: Internal Medicine

## 2011-07-15 ENCOUNTER — Telehealth: Payer: Self-pay | Admitting: *Deleted

## 2011-07-15 MED ORDER — ZOLPIDEM TARTRATE 10 MG PO TABS: 10.0000 mg | ORAL_TABLET | Freq: Every evening | ORAL | Status: DC | PRN

## 2011-07-15 NOTE — Telephone Encounter (Signed)
Rf req for Zolpidem 10 mg 1 po qhs. Ok to Rf? 

## 2011-07-15 NOTE — Telephone Encounter (Signed)
OK to fill this prescription with additional refills x5 Thank you!  

## 2011-11-21 ENCOUNTER — Other Ambulatory Visit: Payer: Self-pay | Admitting: Internal Medicine

## 2011-11-23 ENCOUNTER — Telehealth: Payer: Self-pay | Admitting: *Deleted

## 2011-11-23 NOTE — Telephone Encounter (Signed)
Requested Medications     traMADol (ULTRAM) 50 MG tablet [Pharmacy Med Name: TRAMADOL HCL 50 MG TABLET]   TAKE 1 TO 2 TABLETS BY MOUTH EVERY 6 HOURS AS NEEDED FOR NECK AND BACK PAIN   Disp: 100 tablet R: 5 Start: 11/21/2011  Class: Normal   Requested on: 06/11/2011   Originally ordered on: 08/23/2010  Last refill: 11/05/2011

## 2011-11-23 NOTE — Telephone Encounter (Signed)
OK to fill this prescription with additional refills x1 Thank you!  

## 2011-11-24 MED ORDER — TRAMADOL HCL 50 MG PO TABS: 50.0000 mg | ORAL_TABLET | Freq: Four times a day (QID) | ORAL | Status: DC | PRN

## 2011-11-24 NOTE — Telephone Encounter (Signed)
Done

## 2011-12-25 ENCOUNTER — Other Ambulatory Visit: Payer: Self-pay | Admitting: Internal Medicine

## 2011-12-25 NOTE — Telephone Encounter (Signed)
Ok to Rf? 

## 2012-01-08 ENCOUNTER — Ambulatory Visit (INDEPENDENT_AMBULATORY_CARE_PROVIDER_SITE_OTHER): Payer: PRIVATE HEALTH INSURANCE | Admitting: Internal Medicine

## 2012-01-08 ENCOUNTER — Encounter: Payer: Self-pay | Admitting: Internal Medicine

## 2012-01-08 VITALS — BP 128/66 | HR 72 | Temp 97.4°F | Resp 16 | Wt 128.0 lb

## 2012-01-08 DIAGNOSIS — M25559 Pain in unspecified hip: Secondary | ICD-10-CM

## 2012-01-08 DIAGNOSIS — M25562 Pain in left knee: Secondary | ICD-10-CM | POA: Insufficient documentation

## 2012-01-08 DIAGNOSIS — G4700 Insomnia, unspecified: Secondary | ICD-10-CM

## 2012-01-08 DIAGNOSIS — M25569 Pain in unspecified knee: Secondary | ICD-10-CM

## 2012-01-08 DIAGNOSIS — M25561 Pain in right knee: Secondary | ICD-10-CM

## 2012-01-08 DIAGNOSIS — M25551 Pain in right hip: Secondary | ICD-10-CM

## 2012-01-08 MED ORDER — ZOLPIDEM TARTRATE 10 MG PO TABS: 10.0000 mg | ORAL_TABLET | Freq: Every evening | ORAL | Status: DC | PRN

## 2012-01-08 MED ORDER — MELOXICAM 7.5 MG PO TABS: 7.5000 mg | ORAL_TABLET | Freq: Every day | ORAL | Status: DC

## 2012-01-08 MED ORDER — METHYLPREDNISOLONE ACETATE 80 MG/ML IJ SUSP: 80.0000 mg | Freq: Once | INTRAMUSCULAR | Status: DC

## 2012-01-08 MED ORDER — TRAMADOL HCL 50 MG PO TABS: 50.0000 mg | ORAL_TABLET | Freq: Two times a day (BID) | ORAL | Status: DC | PRN

## 2012-01-08 NOTE — Assessment & Plan Note (Signed)
Continue with current prescription therapy as reflected on the Med list.  

## 2012-01-08 NOTE — Patient Instructions (Signed)
Postprocedure instructions :    A Band-Aid should be left on for 12 hours. Injection therapy is not a cure itself. It is used in conjunction with other modalities. You can use nonsteroidal anti-inflammatories like ibuprofen , hot and cold compresses. Rest is recommended in the next 24 hours. You need to report immediately  if fever, chills or any signs of infection develop. 

## 2012-01-08 NOTE — Assessment & Plan Note (Signed)
7/15 B - likely OA Meloxicam

## 2012-01-08 NOTE — Progress Notes (Signed)
Patient ID: Debra Farmer, female   DOB: 12-Feb-1946, 66 y.o.   MRN: [id number]  Subjective:    Patient ID: Debra Farmer, female    DOB: 10/23/1945, 66 y.o.   MRN: [id number]  Hip Pain  The incident occurred more than 1 week ago. There was no injury mechanism. The pain is present in the right hip. The quality of the pain is described as burning. The pain is at a severity of 9/10. The pain is severe. The pain has been constant since onset. She has tried ice and rest (tramadol) for the symptoms. The treatment provided mild relief.    C/o R hip pain x 1-2 wks - worse with sitting, mostly standing at work -- 9/10 in intensity. Irrad down to R outer thigh. C/o B knees are swollen and - hard to squat    C/o insomnia  She is retiring in Jan 2014  Review of Systems  Constitutional: Positive for fatigue. Negative for fever.  Musculoskeletal: Positive for arthralgias (R hip) and gait problem. Negative for back pain.  Psychiatric/Behavioral: Positive for disturbed wake/sleep cycle (due to pain). Negative for suicidal ideas and decreased concentration. The patient is not nervous/anxious.    Wt Readings from Last 3 Encounters:  01/08/12 128 lb (58.06 kg)  12/26/10 126 lb (57.153 kg)  07/25/10 131 lb (59.421 kg)   BP Readings from Last 3 Encounters:  01/08/12 128/66  12/26/10 100/68  07/25/10 111/70         Objective:   Physical Exam  Constitutional: She appears well-developed and well-nourished. No distress.  Cardiovascular: Normal rate.   No murmur heard. Pulmonary/Chest: She has no rales.  Musculoskeletal: She exhibits tenderness (R hip is very tender; R IT band is tender; LS is ok). She exhibits no edema.       B knees have grinding sound w/ROM  Skin: No erythema.  Psychiatric: Judgment normal.   Lab Results  Component Value Date   WBC 8.6 07/25/2010   HGB 15.1* 07/25/2010   HCT 45.0 07/25/2010   PLT 256 07/25/2010   GLUCOSE 97 07/25/2010   CHOL 210* 07/26/2009   TRIG  228.0* 07/26/2009   HDL 45.00 07/26/2009   LDLDIRECT 127.8 07/26/2009   ALT 10 07/25/2010   AST 15 07/25/2010   NA 137 07/25/2010   K 3.9 07/25/2010   CL 100 07/25/2010   CREATININE 0.67 07/25/2010   BUN 16 07/25/2010   CO2 27 07/25/2010   TSH 3.69 05/20/2010    Procedure Note :     Procedure : Joint Injection,  R  hip   Indication:  Trochanteric bursitis with refractory  chronic pain.   Risks including unsuccessful procedure , bleeding, infection, bruising, skin atrophy and others were explained to the patient in detail as well as the benefits. Informed consent was obtained and signed.   Tthe patient was placed in a comfortable lateral decubitus position. The point of maximal tenderness was identified. Skin was prepped with Betadine and alcohol. Then, a 5 cc syringe with a 1.5 inch long 25-gauge needle was used for a bursa injection.. The needle was advanced  Into the bursa. I injected the bursa with 4 mL of 2% lidocaine and 80 mg of Depo-Medrol .  Band-Aid was applied.   Tolerated well. Complications: None. Good pain relief following the procedure.           Assessment & Plan:

## 2012-01-08 NOTE — Assessment & Plan Note (Signed)
Options discussed Meloxicam Tramadol Will inject

## 2012-01-27 ENCOUNTER — Ambulatory Visit (INDEPENDENT_AMBULATORY_CARE_PROVIDER_SITE_OTHER): Payer: PRIVATE HEALTH INSURANCE | Admitting: Internal Medicine

## 2012-01-27 ENCOUNTER — Other Ambulatory Visit: Payer: Self-pay | Admitting: Internal Medicine

## 2012-01-27 ENCOUNTER — Other Ambulatory Visit (INDEPENDENT_AMBULATORY_CARE_PROVIDER_SITE_OTHER): Payer: PRIVATE HEALTH INSURANCE

## 2012-01-27 ENCOUNTER — Encounter: Payer: Self-pay | Admitting: Internal Medicine

## 2012-01-27 VITALS — BP 126/68 | HR 65 | Temp 97.6°F | Resp 15 | Wt 127.5 lb

## 2012-01-27 DIAGNOSIS — Z0000 Encounter for general adult medical examination without abnormal findings: Secondary | ICD-10-CM

## 2012-01-27 DIAGNOSIS — Z2911 Encounter for prophylactic immunotherapy for respiratory syncytial virus (RSV): Secondary | ICD-10-CM

## 2012-01-27 DIAGNOSIS — M25551 Pain in right hip: Secondary | ICD-10-CM

## 2012-01-27 DIAGNOSIS — Z1239 Encounter for other screening for malignant neoplasm of breast: Secondary | ICD-10-CM

## 2012-01-27 DIAGNOSIS — Z23 Encounter for immunization: Secondary | ICD-10-CM

## 2012-01-27 DIAGNOSIS — M25562 Pain in left knee: Secondary | ICD-10-CM

## 2012-01-27 DIAGNOSIS — F17200 Nicotine dependence, unspecified, uncomplicated: Secondary | ICD-10-CM

## 2012-01-27 DIAGNOSIS — M25569 Pain in unspecified knee: Secondary | ICD-10-CM

## 2012-01-27 DIAGNOSIS — M25559 Pain in unspecified hip: Secondary | ICD-10-CM

## 2012-01-27 DIAGNOSIS — Z1231 Encounter for screening mammogram for malignant neoplasm of breast: Secondary | ICD-10-CM

## 2012-01-27 LAB — CBC WITH DIFFERENTIAL/PLATELET
Basophils Absolute: 0 10*3/uL (ref 0.0–0.1)
Eosinophils Absolute: 0.2 10*3/uL (ref 0.0–0.7)
HCT: 49.7 % — ABNORMAL HIGH (ref 36.0–46.0)
Hemoglobin: 16.3 g/dL — ABNORMAL HIGH (ref 12.0–15.0)
Lymphs Abs: 2.9 10*3/uL (ref 0.7–4.0)
MCHC: 32.8 g/dL (ref 30.0–36.0)
Monocytes Absolute: 0.6 10*3/uL (ref 0.1–1.0)
Monocytes Relative: 6.9 % (ref 3.0–12.0)
Neutro Abs: 5.2 10*3/uL (ref 1.4–7.7)
Platelets: 255 10*3/uL (ref 150.0–400.0)
RDW: 13.7 % (ref 11.5–14.6)

## 2012-01-27 LAB — COMPREHENSIVE METABOLIC PANEL
ALT: 28 U/L (ref 0–35)
AST: 27 U/L (ref 0–37)
Alkaline Phosphatase: 122 U/L — ABNORMAL HIGH (ref 39–117)
Creatinine, Ser: 0.7 mg/dL (ref 0.4–1.2)
GFR: 93.96 mL/min (ref >60.00)
Sodium: 138 mEq/L (ref 135–145)
Total Bilirubin: 0.4 mg/dL (ref 0.3–1.2)
Total Protein: 7.8 g/dL (ref 6.0–8.3)

## 2012-01-27 LAB — URINALYSIS, ROUTINE W REFLEX MICROSCOPIC
Bilirubin Urine: NEGATIVE
Hgb urine dipstick: NEGATIVE
Nitrite: NEGATIVE
Total Protein, Urine: NEGATIVE
Urobilinogen, UA: 0.2 (ref 0.0–1.0)

## 2012-01-27 LAB — LIPID PANEL
HDL: 51.3 mg/dL (ref >39.00)
Total CHOL/HDL Ratio: 5
VLDL: 25.8 mg/dL (ref 0.0–40.0)

## 2012-01-27 MED ORDER — MELOXICAM 7.5 MG PO TABS: 7.5000 mg | ORAL_TABLET | Freq: Every day | ORAL | Status: DC

## 2012-01-27 NOTE — Assessment & Plan Note (Signed)
Discussed.

## 2012-01-27 NOTE — Assessment & Plan Note (Signed)
Better  

## 2012-01-27 NOTE — Assessment & Plan Note (Signed)
Ortho cons or a steroid inj was advised Restart Mobic

## 2012-01-27 NOTE — Progress Notes (Signed)
   Subjective:    Patient ID: Debra Farmer, female    DOB: [birth year], 66 y.o.   MRN: [id number]  HPI The patient is here for a wellness exam. The patient has been doing well overall without major physical or psychological issues going on lately. C/o R hip pain - much better after the shot. C/o R knee was swollen and - hard to squat    C/o insomnia - much better  She is retiring in Jan 2014  Review of Systems  Constitutional: Negative for fever and fatigue.  Musculoskeletal: Positive for arthralgias (R hip is NT, R knee w/decr ROM) and gait problem. Negative for back pain.  Psychiatric/Behavioral: Negative for suicidal ideas, disturbed wake/sleep cycle and decreased concentration. The patient is not nervous/anxious.    Wt Readings from Last 3 Encounters:  01/27/12 127 lb 8 oz (57.834 kg)  01/08/12 128 lb (58.06 kg)  12/26/10 126 lb (57.153 kg)   BP Readings from Last 3 Encounters:  01/27/12 126/68  01/08/12 128/66  12/26/10 100/68         Objective:   Physical Exam  Constitutional: She appears well-developed and well-nourished. No distress.  Cardiovascular: Normal rate.   No murmur heard. Pulmonary/Chest: She has no rales.  Musculoskeletal: She exhibits tenderness (R hip is not tender; R IT band is less tender; LS is ok. R knee is w/decr ROM). She exhibits no edema.       B knees have grinding sound w/ROM  Skin: No erythema.  Psychiatric: Judgment normal.   Lab Results  Component Value Date   WBC 8.6 07/25/2010   HGB 15.1* 07/25/2010   HCT 45.0 07/25/2010   PLT 256 07/25/2010   GLUCOSE 97 07/25/2010   CHOL 210* 07/26/2009   TRIG 228.0* 07/26/2009   HDL 45.00 07/26/2009   LDLDIRECT 127.8 07/26/2009   ALT 10 07/25/2010   AST 15 07/25/2010   NA 137 07/25/2010   K 3.9 07/25/2010   CL 100 07/25/2010   CREATININE 0.67 07/25/2010   BUN 16 07/25/2010   CO2 27 07/25/2010   TSH 3.69 05/20/2010             Assessment & Plan:

## 2012-01-27 NOTE — Assessment & Plan Note (Addendum)
We discussed age appropriate health related issues, including available/recomended screening tests and vaccinations. We discussed a need for adhering to healthy diet and exercise. Labs/EKG were reviewed/ordered. All questions were answered. She had a pneumovax Will give Zostavax Colon w/Dr Lawerance Bach ordered

## 2012-01-28 ENCOUNTER — Telehealth: Payer: Self-pay | Admitting: Internal Medicine

## 2012-01-28 NOTE — Addendum Note (Signed)
Addended by: Rock Nephew T on: 01/28/2012 11:34 AM   Modules accepted: Orders

## 2012-01-28 NOTE — Telephone Encounter (Signed)
Debra Farmer, please, inform patient that all labs are normal except for elev chol and elev hemoglobin. She needs to stop smoking Would it be ok to call in a chol med for her? Thx

## 2012-01-29 NOTE — Telephone Encounter (Signed)
Pt informed of lab results, pt states that she doesn't wish to start a cholesterol medication at this time, she will try to work on her cholesterol with her diet. (Lab results also mailed to pt per request)

## 2012-01-30 NOTE — Telephone Encounter (Signed)
Noted. Thx.

## 2012-02-24 ENCOUNTER — Telehealth: Payer: Self-pay | Admitting: Internal Medicine

## 2012-02-24 DIAGNOSIS — M25561 Pain in right knee: Secondary | ICD-10-CM

## 2012-02-24 DIAGNOSIS — M1990 Unspecified osteoarthritis, unspecified site: Secondary | ICD-10-CM

## 2012-02-24 NOTE — Telephone Encounter (Signed)
Referral request to see Dr Thomasena Edis with Debra Farmer, Rt knee pain. Seen Dr Posey Rea in the past for this

## 2012-02-26 NOTE — Telephone Encounter (Signed)
Pt informed

## 2012-02-26 NOTE — Telephone Encounter (Signed)
Order placed with [facility] 

## 2012-02-29 ENCOUNTER — Encounter: Payer: Self-pay | Admitting: Internal Medicine

## 2012-02-29 ENCOUNTER — Ambulatory Visit (INDEPENDENT_AMBULATORY_CARE_PROVIDER_SITE_OTHER): Payer: PRIVATE HEALTH INSURANCE | Admitting: Internal Medicine

## 2012-02-29 ENCOUNTER — Encounter: Payer: Self-pay | Admitting: General Practice

## 2012-02-29 VITALS — BP 128/70 | HR 65 | Temp 97.9°F | Resp 15 | Wt 128.5 lb

## 2012-02-29 DIAGNOSIS — M542 Cervicalgia: Secondary | ICD-10-CM

## 2012-02-29 DIAGNOSIS — M25559 Pain in unspecified hip: Secondary | ICD-10-CM

## 2012-02-29 DIAGNOSIS — M25551 Pain in right hip: Secondary | ICD-10-CM

## 2012-02-29 DIAGNOSIS — R209 Unspecified disturbances of skin sensation: Secondary | ICD-10-CM

## 2012-02-29 DIAGNOSIS — M79601 Pain in right arm: Secondary | ICD-10-CM

## 2012-02-29 DIAGNOSIS — M79609 Pain in unspecified limb: Secondary | ICD-10-CM

## 2012-02-29 MED ORDER — CYCLOBENZAPRINE HCL 5 MG PO TABS: 5.0000 mg | ORAL_TABLET | Freq: Three times a day (TID) | ORAL | Status: DC | PRN

## 2012-02-29 MED ORDER — GABAPENTIN 300 MG PO CAPS: 300.0000 mg | ORAL_CAPSULE | Freq: Three times a day (TID) | ORAL | Status: DC

## 2012-02-29 NOTE — Progress Notes (Signed)
  Subjective:    Patient ID: ILLA ENLOW, female    DOB: [birth year], 66 y.o.   MRN: [id number]  HPI  Complains of pain in right arm Associated with numbness and tingling sensation, question swollen Also associated with chronic neck tightness Symptoms not clearly positional, but worse at night Minimally improved with tramadol and ibuprofen therapy Denies falls or injury Request work note - "too much pain to work"  Past Medical History  Diagnosis Date  . Arthritis     Review of Systems  Constitutional: Positive for fatigue. Negative for fever.  Musculoskeletal: Positive for arthralgias. Negative for back pain.  Neurological: Negative for dizziness, tremors, seizures and speech difficulty.  Psychiatric/Behavioral: Negative for self-injury.       Objective:   Physical Exam BP 128/70  Pulse 65  Temp 97.9 F (36.6 C) (Oral)  Resp 15  Wt 128 lb 8 oz (58.287 kg)  SpO2 97% Constitutional: She appears well-developed and well-nourished. No distress. nontoxic HENT: Head: Normocephalic and atraumatic. Ears: B TMs ok, no erythema or effusion; Nose: Nose normal. Mouth/Throat: Oropharynx is clear and moist. No oropharyngeal exudate.  Eyes: Conjunctivae and EOM are normal. Pupils are equal, round, and reactive to light. No scleral icterus.  Neck: Normal range of motion. Neck supple. No JVD present. No thyromegaly present.  Cardiovascular: Normal rate, regular rhythm and normal heart sounds.  No murmur heard. trace BLE edema at ankles. Pulmonary/Chest: Effort normal and breath sounds normal. No respiratory distress. She has no wheezes.  Musculoskeletal: R shoulder, R elbow, wrist and fingers all with normal range of motion, no gross deformities.  Neurological: She is alert and oriented to person, place, and time. No cranial nerve deficit. Coordination, speech, balance and gait all normal.  Skin: Skin is warm and dry. No rash noted. No erythema.  Psychiatric: She has a normal mood and  affect. Her behavior is normal. Judgment and thought content normal.    Lab Results  Component Value Date   WBC 9.1 01/27/2012   HGB 16.3* 01/27/2012   HCT 49.7* 01/27/2012   PLT 255.0 01/27/2012   GLUCOSE 102* 01/27/2012   CHOL 269* 01/27/2012   TRIG 129.0 01/27/2012   HDL 51.30 01/27/2012   LDLDIRECT 194.4 01/27/2012   ALT 28 01/27/2012   AST 27 01/27/2012   NA 138 01/27/2012   K 5.2* 01/27/2012   CL 100 01/27/2012   CREATININE 0.7 01/27/2012   BUN 15 01/27/2012   CO2 30 01/27/2012   TSH 3.31 01/27/2012      Assessment & Plan:   RUE pain, ?radicular vs peripheral neuropathy reviewed 07/2011 MRI c-spine -  check NCS and start gabapentin 3 times a day plus Flexeril each bedtime follow up PCP Work note x 48h given  R knee pain, no effusion - chronic ortho refer pending

## 2012-02-29 NOTE — Patient Instructions (Signed)
It was good to see you today. we'll make referral for nerve conduction study to evaluate the source of your pain. Our office will contact you regarding appointment(s) once made. In the meanwhile, start gabapentin 1 capsule 3 times daily for pain and Flexeril as needed for neck pain Your prescription(s) have been submitted to your pharmacy. Please take as directed and contact our office if you believe you are having problem(s) with the medication(s). Okay to use these new medications with for tramadol and ibuprofen as needed Keep followup with orthopedics Dr. Thomasena Edis as referred - and call your doctor if symptoms worse or unimproved

## 2012-03-02 ENCOUNTER — Other Ambulatory Visit: Payer: Self-pay | Admitting: Internal Medicine

## 2012-03-02 NOTE — Telephone Encounter (Signed)
Ok to Rf? 

## 2012-03-03 ENCOUNTER — Telehealth: Payer: Self-pay | Admitting: Internal Medicine

## 2012-03-03 NOTE — Telephone Encounter (Signed)
Dr Felicity Coyer not available at this time, PCP is Dr Posey Rea  Note and chart reviewed  I dont think pt needs emergent evaluation, though should likely have repeat eval for hand pain and worsening numbness, ? Carpal tunnel or joint related pain, or even cervical nerve related pain - consider OV tomorrow, any MD ok

## 2012-03-03 NOTE — Telephone Encounter (Signed)
Called left message to call back 

## 2012-03-03 NOTE — Telephone Encounter (Signed)
Caller: Ramsie/Patient; Patient Name: Debra Farmer; PCP: Sonda Primes (Adults only); Best Callback Phone Number: [phone]; Reason for call: She has been having pain  in her  neck radiates down both arms, into hands. She has had ongoing discomfort but the  pain but suddenly  worsened on Saturday 02/27/12.  She states both hands were  pain "throbbing"  and swollen.  SHe is unable to get her wedding rings off.  She came to the office on Monday 02/29/12 and was evaluated . She states that her thumb, index finger and middle finger is numb.  She was prescribed a medication  Neurontin and Flexaril.  She takes the medication at night but cannot take the medication during the day due to sleepiness/sideeffects and the Neurontin makes her dizzy. She is able to move all extremities but is having problems working at her job. She states she is worse than when seen in office because now her thumb is numb.  Color, movement are all good, but ongoing swelling in hands R>L. Pain is 10/10 in bilateral hands.   Afebrile. No other symptoms. Voice clear . Emergent s/sx ruled out per Hand non-injury protocol with exceptionto "Severe pain with movement that limits normal activities". See Provider in 24 hours. Caller states she has to work and would like guidance from Dr. Trish Mage on what to do now that her thumb is number.  Advised I would forward her concerns about medication and worsening to Dr. Felicity Coyer . Caller expressed understanding. Advised she is at work and may not be able to answer the phone until 5p. Please leave message.

## 2012-03-04 ENCOUNTER — Ambulatory Visit (INDEPENDENT_AMBULATORY_CARE_PROVIDER_SITE_OTHER): Payer: PRIVATE HEALTH INSURANCE | Admitting: Internal Medicine

## 2012-03-04 ENCOUNTER — Ambulatory Visit (INDEPENDENT_AMBULATORY_CARE_PROVIDER_SITE_OTHER)
Admission: RE | Admit: 2012-03-04 | Discharge: 2012-03-04 | Disposition: A | Discharge status: Home / Self Care | Payer: PRIVATE HEALTH INSURANCE | Source: Ambulatory Visit | Attending: Internal Medicine | Admitting: Internal Medicine

## 2012-03-04 ENCOUNTER — Encounter: Payer: Self-pay | Admitting: Internal Medicine

## 2012-03-04 ENCOUNTER — Other Ambulatory Visit (INDEPENDENT_AMBULATORY_CARE_PROVIDER_SITE_OTHER): Payer: PRIVATE HEALTH INSURANCE

## 2012-03-04 VITALS — BP 100/60 | HR 76 | Temp 98.1°F | Resp 16 | Wt 129.0 lb

## 2012-03-04 DIAGNOSIS — F17200 Nicotine dependence, unspecified, uncomplicated: Secondary | ICD-10-CM

## 2012-03-04 DIAGNOSIS — M129 Arthropathy, unspecified: Secondary | ICD-10-CM

## 2012-03-04 DIAGNOSIS — M130 Polyarthritis, unspecified: Secondary | ICD-10-CM

## 2012-03-04 DIAGNOSIS — J449 Chronic obstructive pulmonary disease, unspecified: Secondary | ICD-10-CM

## 2012-03-04 MED ORDER — OXYCODONE-ACETAMINOPHEN 10-325 MG PO TABS: 1.0000 | ORAL_TABLET | Freq: Three times a day (TID) | ORAL | Status: DC | PRN

## 2012-03-04 MED ORDER — METHYLPREDNISOLONE ACETATE 80 MG/ML IJ SUSP
120.0000 mg | Freq: Once | INTRAMUSCULAR | Status: AC
  Administered 2012-03-04: 120 mg via INTRAMUSCULAR

## 2012-03-04 MED ORDER — INDOMETHACIN 50 MG PO CAPS: 50.0000 mg | ORAL_CAPSULE | Freq: Two times a day (BID) | ORAL | Status: DC

## 2012-03-04 NOTE — Progress Notes (Signed)
   Subjective:    Patient ID: Debra Farmer, female    DOB: 1946/01/30, 66 y.o.   MRN: [id number]  Hand Pain  The incident occurred 3 to 5 days ago (B hands started to hurt on Sat). There was no injury mechanism. The pain is at a severity of 9/10. The pain is severe. The symptoms are aggravated by movement. She has tried immobilization and ice for the symptoms. The treatment provided mild relief.    C/o R hip pain x 1-2 wks - worse with sitting, mostly standing at work -- 9/10 in intensity. Irrad down to R outer thigh. C/o B knees are swollen and - hard to squat    C/o insomnia  She is retiring in Jan 2014  Review of Systems  Constitutional: Positive for fatigue. Negative for fever.  Musculoskeletal: Positive for arthralgias (R hip) and gait problem. Negative for back pain.  Psychiatric/Behavioral: Positive for disturbed wake/sleep cycle (due to pain). Negative for suicidal ideas and decreased concentration. The patient is not nervous/anxious.    Wt Readings from Last 3 Encounters:  03/04/12 129 lb (58.514 kg)  02/29/12 128 lb 8 oz (58.287 kg)  01/27/12 127 lb 8 oz (57.834 kg)   BP Readings from Last 3 Encounters:  03/04/12 100/60  02/29/12 128/70  01/27/12 126/68         Objective:   Physical Exam  Constitutional: She appears well-developed and well-nourished. No distress.  Cardiovascular: Normal rate.   No murmur heard. Pulmonary/Chest: She has no rales.  Musculoskeletal: She exhibits tenderness (R hip is very tender; R IT band is tender; LS is ok). She exhibits no edema.       B hands are tender and swollen R>L  Skin: No erythema.  Psychiatric: Judgment normal.   Lab Results  Component Value Date   WBC 9.1 01/27/2012   HGB 16.3* 01/27/2012   HCT 49.7* 01/27/2012   PLT 255.0 01/27/2012   GLUCOSE 102* 01/27/2012   CHOL 269* 01/27/2012   TRIG 129.0 01/27/2012   HDL 51.30 01/27/2012   LDLDIRECT 194.4 01/27/2012   ALT 28 01/27/2012   AST 27 01/27/2012   NA 138 01/27/2012    K 5.2* 01/27/2012   CL 100 01/27/2012   CREATININE 0.7 01/27/2012   BUN 15 01/27/2012   CO2 30 01/27/2012   TSH 3.31 01/27/2012              Assessment & Plan:

## 2012-03-04 NOTE — Assessment & Plan Note (Signed)
Depomedrol Labs Indocin Oxy/apap prn

## 2012-03-04 NOTE — Telephone Encounter (Signed)
Can work her in today around 4:00 pm. Phete to inform pt.

## 2012-03-04 NOTE — Assessment & Plan Note (Signed)
CXR

## 2012-03-05 LAB — VITAMIN D 25 HYDROXY (VIT D DEFICIENCY, FRACTURES): Vit D, 25-Hydroxy: 49 ng/mL (ref 30–89)

## 2012-03-05 LAB — RHEUMATOID FACTOR: Rhuematoid fact SerPl-aCnc: <10 IU/mL (ref <=14)

## 2012-03-06 ENCOUNTER — Encounter: Payer: Self-pay | Admitting: Internal Medicine

## 2012-03-06 NOTE — Assessment & Plan Note (Signed)
Discussed.

## 2012-03-07 ENCOUNTER — Telehealth: Payer: Self-pay | Admitting: Internal Medicine

## 2012-03-07 LAB — ANTI-NUCLEAR AB-TITER (ANA TITER): ANA Titer 1: NEGATIVE

## 2012-03-07 LAB — TSH: TSH: 4.37 u[IU]/mL (ref 0.35–5.50)

## 2012-03-07 LAB — SEDIMENTATION RATE: Sed Rate: 27 mm/hr — ABNORMAL HIGH (ref 0–22)

## 2012-03-07 MED ORDER — TRAMADOL HCL 50 MG PO TABS: 50.0000 mg | ORAL_TABLET | Freq: Two times a day (BID) | ORAL | Status: DC | PRN

## 2012-03-07 NOTE — Telephone Encounter (Signed)
Caller: [place]/Patient; Patient Name: Debra Farmer; PCP: Sonda Primes (Adults only); Best Callback Phone Number: [phone]. Caller reports she was seen in the office last Friday, 9/20 and was given Percocet for pain. Caller asking if she can instead get a refill on her Tramadol. Caller reports the Percocet is too strong for her and she would like Tramadol instead. [facility] [address]. Caller advised a note will be sent for PCP re same. She can be reached at above number as needed.

## 2012-03-07 NOTE — Telephone Encounter (Signed)
OK - bring Percocet Rx here to discard and get a rx for Tramadol Thx

## 2012-03-07 NOTE — Telephone Encounter (Signed)
Debra Farmer, please, inform patient that OA Thx

## 2012-03-08 NOTE — Telephone Encounter (Signed)
Pt informed of below. Tramadol Rx upfront.

## 2012-03-21 ENCOUNTER — Ambulatory Visit: Payer: PRIVATE HEALTH INSURANCE | Admitting: Internal Medicine

## 2012-03-25 ENCOUNTER — Encounter: Payer: Self-pay | Admitting: Internal Medicine

## 2012-04-11 ENCOUNTER — Telehealth: Payer: Self-pay | Admitting: Internal Medicine

## 2012-04-11 DIAGNOSIS — M1990 Unspecified osteoarthritis, unspecified site: Secondary | ICD-10-CM

## 2012-04-11 NOTE — Telephone Encounter (Signed)
We need to have her see a rheumatologist - we will schedule  I can work her in if needed Thx

## 2012-04-11 NOTE — Telephone Encounter (Signed)
° °  Caller: [name]/Patient; Patient Name: Debra Farmer; PCP: Sonda Primes (Adults only); Best Callback Phone Number: [phone].  She has bilateral swelling in her hands that started 1 month ago and continues to get worse.  She went to [facility] and diagnosed with [employer], but she thinks there is more going on.  The past Indocin and Neurontin did not help.  She has numbness in the fingertips of both hands.   She has taken some Ibuprofen and  this  does help. Traiged Hand Non-injury and all emergent symptoms ruled out.  This all started after getting a Cortisone shot in her hip about 1 month ago.  Needs to be seen in 24 hours as Sx began ater started or changing dose of prescription...drug.  Offered appt today, 04/11/12, but pt has to work 1500-2300.  Also offered appt. for tomorrow 04/12/12, but pt says she really can't afford an appt., can't the Dr. just call something in as she was "just seen?"  Per EPIC encounters looks like last office visist was 03/07/12. Instructed i would send a note.  Home care and call back instrucions given.  Please call pt to advise.

## 2012-04-12 NOTE — Telephone Encounter (Signed)
Pt informed of referral and of MD's advisement.

## 2012-04-15 ENCOUNTER — Encounter: Payer: Self-pay | Admitting: Internal Medicine

## 2012-04-15 ENCOUNTER — Ambulatory Visit (INDEPENDENT_AMBULATORY_CARE_PROVIDER_SITE_OTHER): Payer: PRIVATE HEALTH INSURANCE | Admitting: Internal Medicine

## 2012-04-15 VITALS — BP 108/58 | HR 70 | Temp 97.1°F | Resp 16 | Wt 127.0 lb

## 2012-04-15 DIAGNOSIS — M130 Polyarthritis, unspecified: Secondary | ICD-10-CM

## 2012-04-15 DIAGNOSIS — M129 Arthropathy, unspecified: Secondary | ICD-10-CM

## 2012-04-15 DIAGNOSIS — G5600 Carpal tunnel syndrome, unspecified upper limb: Secondary | ICD-10-CM | POA: Insufficient documentation

## 2012-04-15 MED ORDER — PREDNISONE 10 MG PO TABS: ORAL_TABLET | ORAL | Status: DC

## 2012-04-15 MED ORDER — METHYLPREDNISOLONE ACETATE 80 MG/ML IJ SUSP
120.0000 mg | Freq: Once | INTRAMUSCULAR | Status: AC
  Administered 2012-04-15: 120 mg via INTRAMUSCULAR

## 2012-04-15 NOTE — Progress Notes (Signed)
Patient ID: Debra Farmer, female   DOB: April 06, 1946, 66 y.o.   MRN: [id number]   Subjective:    Patient ID: Debra Farmer, female    DOB: 01-31-1946, 66 y.o.   MRN: [id number]  Hand Pain  The incident occurred more than 1 week ago (B hands started to hurt again in Sept). There was no injury mechanism. The pain is at a severity of 9/10. The pain is severe. The symptoms are aggravated by movement. She has tried immobilization and ice for the symptoms. The treatment provided mild relief.    C/o R hip pain x 1-2 wks - worse with sitting, mostly standing at work -- 9/10 in intensity. Irrad down to R outer thigh. C/o B knees are swollen and - hard to squat    C/o insomnia  She is retiring in Jan 2014  Review of Systems  Constitutional: Positive for fatigue. Negative for fever.  Musculoskeletal: Positive for arthralgias (R hip) and gait problem. Negative for back pain.  Psychiatric/Behavioral: Positive for disturbed wake/sleep cycle (due to pain). Negative for suicidal ideas and decreased concentration. The patient is not nervous/anxious.    Wt Readings from Last 3 Encounters:  04/15/12 127 lb (57.607 kg)  03/04/12 129 lb (58.514 kg)  02/29/12 128 lb 8 oz (58.287 kg)   BP Readings from Last 3 Encounters:  04/15/12 108/58  03/04/12 100/60  02/29/12 128/70         Objective:   Physical Exam  Constitutional: She appears well-developed and well-nourished. No distress.  Cardiovascular: Normal rate.   No murmur heard. Pulmonary/Chest: She has no rales.  Musculoskeletal: She exhibits no edema and no tenderness (B hands are very tender w/decr ROM).       B hands are tender and swollen R>L  Skin: No erythema.  Psychiatric: Judgment normal.       tearful  Tearful Lab Results  Component Value Date   WBC 9.1 01/27/2012   HGB 16.3* 01/27/2012   HCT 49.7* 01/27/2012   PLT 255.0 01/27/2012   GLUCOSE 102* 01/27/2012   CHOL 269* 01/27/2012   TRIG 129.0 01/27/2012   HDL 51.30 01/27/2012     LDLDIRECT 194.4 01/27/2012   ALT 28 01/27/2012   AST 27 01/27/2012   NA 138 01/27/2012   K 5.2* 01/27/2012   CL 100 01/27/2012   CREATININE 0.7 01/27/2012   BUN 15 01/27/2012   CO2 30 01/27/2012   TSH 4.37 03/04/2012              Assessment & Plan:

## 2012-04-15 NOTE — Assessment & Plan Note (Addendum)
S/p EMG Needs to see Dr Amanda Pea - will ref B splints Depomedrol Tramadol Prednisone 10 mg: take 4 tabs a day x 3 days; then 3 tabs a day x 4 days; then 2 tabs a day x 4 days, then 1 tab a day x 6 days, then stop. Take pc. Stay off work

## 2012-04-15 NOTE — Addendum Note (Signed)
Addended by: Elnora Morrison on: 04/15/2012 04:32 PM   Modules accepted: Orders

## 2012-04-15 NOTE — Assessment & Plan Note (Signed)
Rheum cons S/p Ortho cons

## 2012-04-18 ENCOUNTER — Telehealth: Payer: Self-pay | Admitting: Internal Medicine

## 2012-04-18 NOTE — Telephone Encounter (Signed)
Pt is calling to see when the FLMA papers will be ready and to request a note for work for Friday.

## 2012-04-18 NOTE — Telephone Encounter (Signed)
Do I have FMLA? Thx

## 2012-04-19 NOTE — Telephone Encounter (Signed)
Debra Farmer, have you seen these forms?

## 2012-05-02 ENCOUNTER — Other Ambulatory Visit: Payer: Self-pay | Admitting: Internal Medicine

## 2012-05-03 NOTE — Telephone Encounter (Signed)
Last written 03/07/2012 #120 with 1 refill-please advise.

## 2012-06-20 ENCOUNTER — Other Ambulatory Visit: Payer: Self-pay | Admitting: Internal Medicine

## 2012-06-20 NOTE — Telephone Encounter (Signed)
Ok to Rf? 

## 2012-06-21 NOTE — Telephone Encounter (Signed)
Needs ov if sick thx

## 2012-06-28 ENCOUNTER — Other Ambulatory Visit: Payer: Self-pay | Admitting: Internal Medicine

## 2012-06-30 NOTE — Telephone Encounter (Signed)
Pt calling regarding below request. Please advise.

## 2012-07-04 ENCOUNTER — Other Ambulatory Visit: Payer: Self-pay | Admitting: Internal Medicine

## 2012-07-04 NOTE — Telephone Encounter (Signed)
Ok to Rf? 

## 2012-08-23 ENCOUNTER — Other Ambulatory Visit: Payer: Self-pay | Admitting: Internal Medicine

## 2012-09-01 ENCOUNTER — Encounter: Payer: Self-pay | Admitting: Internal Medicine

## 2012-09-01 ENCOUNTER — Ambulatory Visit (INDEPENDENT_AMBULATORY_CARE_PROVIDER_SITE_OTHER): Payer: PRIVATE HEALTH INSURANCE | Admitting: Internal Medicine

## 2012-09-01 VITALS — BP 120/62 | HR 81 | Temp 98.9°F | Wt 130.2 lb

## 2012-09-01 DIAGNOSIS — M130 Polyarthritis, unspecified: Secondary | ICD-10-CM

## 2012-09-01 DIAGNOSIS — M542 Cervicalgia: Secondary | ICD-10-CM

## 2012-09-01 DIAGNOSIS — F17200 Nicotine dependence, unspecified, uncomplicated: Secondary | ICD-10-CM

## 2012-09-01 DIAGNOSIS — G5600 Carpal tunnel syndrome, unspecified upper limb: Secondary | ICD-10-CM

## 2012-09-01 DIAGNOSIS — G4700 Insomnia, unspecified: Secondary | ICD-10-CM

## 2012-09-01 DIAGNOSIS — M129 Arthropathy, unspecified: Secondary | ICD-10-CM

## 2012-09-01 MED ORDER — TRAMADOL HCL 50 MG PO TABS: 50.0000 mg | ORAL_TABLET | Freq: Two times a day (BID) | ORAL | Status: DC | PRN

## 2012-09-01 MED ORDER — ZOLPIDEM TARTRATE 10 MG PO TABS: 10.0000 mg | ORAL_TABLET | Freq: Every evening | ORAL | Status: DC | PRN

## 2012-09-01 NOTE — Assessment & Plan Note (Signed)
Better  

## 2012-09-01 NOTE — Assessment & Plan Note (Addendum)
3/14 relapsing; s/p recent steroid taper Dr Orpah Clinton has ref her to another rheumatologist

## 2012-09-01 NOTE — Assessment & Plan Note (Signed)
Continue with current prescription therapy as reflected on the Med list.  

## 2012-09-01 NOTE — Assessment & Plan Note (Signed)
Discussed.

## 2012-09-01 NOTE — Progress Notes (Signed)
   Subjective:     HPI  C/o R knee pain and swelling x 1-2 wks - worse with standing.L knee hurts too. C/o B wrists are swollen and stiff. Her knee was tapped by Dr Thomasena Edis. Her ANA was up. Predn taber did help, however all sx relapsed when she finished the last pill 3-4 d ago...  C/o insomnia  She is retiring in Jan 2014  Review of Systems  Constitutional: Positive for fatigue. Negative for fever, activity change and unexpected weight change.  Respiratory: Negative for cough.   Cardiovascular: Negative for chest pain and palpitations.  Musculoskeletal: Positive for joint swelling, arthralgias (R hip) and gait problem. Negative for back pain.  Skin: Negative for pallor and rash.  Psychiatric/Behavioral: Positive for sleep disturbance (due to pain). Negative for suicidal ideas and decreased concentration. The patient is not nervous/anxious.    Wt Readings from Last 3 Encounters:  09/01/12 130 lb 3.2 oz (59.058 kg)  04/15/12 127 lb (57.607 kg)  03/04/12 129 lb (58.514 kg)   BP Readings from Last 3 Encounters:  09/01/12 120/62  04/15/12 108/58  03/04/12 100/60         Objective:   Physical Exam  Constitutional: She appears well-developed and well-nourished. No distress.  HENT:  Head: Normocephalic.  Right Ear: External ear normal.  Left Ear: External ear normal.  Nose: Nose normal.  Mouth/Throat: Oropharynx is clear and moist.  Eyes: Conjunctivae are normal. Pupils are equal, round, and reactive to light. Right eye exhibits no discharge. Left eye exhibits no discharge.  Neck: Normal range of motion. Neck supple. No JVD present. No tracheal deviation present. No thyromegaly present.  Cardiovascular: Normal rate, regular rhythm and normal heart sounds.   No murmur heard. Pulmonary/Chest: No stridor. No respiratory distress. She has no wheezes. She has no rales.  Abdominal: Soft. Bowel sounds are normal. She exhibits no distension and no mass. There is no tenderness. There is  no rebound and no guarding.  Musculoskeletal: She exhibits edema and tenderness (B hands are very tender w/decr ROM).  B wrists are tender and swollen R>L R knee is swollen  Lymphadenopathy:    She has no cervical adenopathy.  Neurological: She displays normal reflexes. No cranial nerve deficit. She exhibits normal muscle tone. Coordination normal.  Skin: No rash noted. No erythema. No pallor.  Psychiatric: She has a normal mood and affect. Her behavior is normal. Judgment and thought content normal.  Not tearful  Tearful Lab Results  Component Value Date   WBC 9.1 01/27/2012   HGB 16.3* 01/27/2012   HCT 49.7* 01/27/2012   PLT 255.0 01/27/2012   GLUCOSE 102* 01/27/2012   CHOL 269* 01/27/2012   TRIG 129.0 01/27/2012   HDL 51.30 01/27/2012   LDLDIRECT 194.4 01/27/2012   ALT 28 01/27/2012   AST 27 01/27/2012   NA 138 01/27/2012   K 5.2* 01/27/2012   CL 100 01/27/2012   CREATININE 0.7 01/27/2012   BUN 15 01/27/2012   CO2 30 01/27/2012   TSH 4.37 03/04/2012              Assessment & Plan:

## 2012-09-01 NOTE — Assessment & Plan Note (Signed)
Better s/p surgery B

## 2012-09-19 ENCOUNTER — Ambulatory Visit (INDEPENDENT_AMBULATORY_CARE_PROVIDER_SITE_OTHER)
Admission: RE | Admit: 2012-09-19 | Discharge: 2012-09-19 | Disposition: A | Discharge status: Home / Self Care | Payer: PRIVATE HEALTH INSURANCE | Source: Ambulatory Visit | Attending: Internal Medicine | Admitting: Internal Medicine

## 2012-09-19 ENCOUNTER — Ambulatory Visit (INDEPENDENT_AMBULATORY_CARE_PROVIDER_SITE_OTHER): Payer: PRIVATE HEALTH INSURANCE | Admitting: Internal Medicine

## 2012-09-19 ENCOUNTER — Encounter: Payer: Self-pay | Admitting: Internal Medicine

## 2012-09-19 VITALS — BP 110/60 | HR 80 | Temp 99.5°F | Resp 16 | Wt 127.0 lb

## 2012-09-19 DIAGNOSIS — R062 Wheezing: Secondary | ICD-10-CM

## 2012-09-19 DIAGNOSIS — R05 Cough: Secondary | ICD-10-CM

## 2012-09-19 DIAGNOSIS — J449 Chronic obstructive pulmonary disease, unspecified: Secondary | ICD-10-CM

## 2012-09-19 MED ORDER — HYDROCOD POLST-CHLORPHEN POLST 10-8 MG/5ML PO LQCR: 5.0000 mL | Freq: Two times a day (BID) | ORAL | Status: DC | PRN

## 2012-09-19 MED ORDER — MOXIFLOXACIN HCL 400 MG PO TABS: 400.0000 mg | ORAL_TABLET | Freq: Every day | ORAL | Status: DC

## 2012-09-19 MED ORDER — METHOTREXATE 2.5 MG PO TABS: 2.5000 mg | ORAL_TABLET | ORAL | Status: DC

## 2012-09-19 MED ORDER — FOLIC ACID 1 MG PO TABS: 1.0000 mg | ORAL_TABLET | Freq: Every day | ORAL | Status: DC

## 2012-09-19 MED ORDER — FLUTICASONE FUROATE-VILANTEROL 100-25 MCG/INH IN AEPB: 1.0000 | INHALATION_SPRAY | Freq: Every day | RESPIRATORY_TRACT | Status: DC

## 2012-09-19 NOTE — Assessment & Plan Note (Addendum)
Tussionex CXR Breo

## 2012-09-19 NOTE — Progress Notes (Signed)
Subjective:     Cough This is a new problem. The current episode started in the past 7 days (last Thur). The problem has been gradually worsening. The problem occurs every few minutes. The cough is productive of purulent sputum. Associated symptoms include a fever, nasal congestion, shortness of breath and wheezing. Pertinent negatives include no chest pain, myalgias or rash. The symptoms are aggravated by cold air. She has tried OTC cough suppressant for the symptoms. The treatment provided no relief. Her past medical history is significant for COPD.    F/u poss RA - started on MTX last Thursday night  C/o insomnia  She is retiring in Jan 2014  Review of Systems  Constitutional: Positive for fever and fatigue. Negative for activity change and unexpected weight change.  Respiratory: Positive for cough, shortness of breath and wheezing.   Cardiovascular: Negative for chest pain and palpitations.  Musculoskeletal: Positive for joint swelling, arthralgias (R hip) and gait problem. Negative for myalgias and back pain.  Skin: Negative for pallor and rash.  Psychiatric/Behavioral: Positive for sleep disturbance (due to pain). Negative for suicidal ideas and decreased concentration. The patient is not nervous/anxious.    Wt Readings from Last 3 Encounters:  09/19/12 127 lb (57.607 kg)  09/01/12 130 lb 3.2 oz (59.058 kg)  04/15/12 127 lb (57.607 kg)   BP Readings from Last 3 Encounters:  09/19/12 110/60  09/01/12 120/62  04/15/12 108/58         Objective:   Physical Exam  Constitutional: She appears well-developed and well-nourished. No distress.  HENT:  Head: Normocephalic.  Right Ear: External ear normal.  Left Ear: External ear normal.  Nose: Nose normal.  Mouth/Throat: Oropharynx is clear and moist.  Eyes: Conjunctivae are normal. Pupils are equal, round, and reactive to light. Right eye exhibits no discharge. Left eye exhibits no discharge.  Neck: Normal range of motion.  Neck supple. No JVD present. No tracheal deviation present. No thyromegaly present.  Cardiovascular: Normal rate, regular rhythm and normal heart sounds.   No murmur heard. Pulmonary/Chest: No stridor. No respiratory distress. She has no wheezes. She has no rales.  Abdominal: Soft. Bowel sounds are normal. She exhibits no distension and no mass. There is no tenderness. There is no rebound and no guarding.  Musculoskeletal: She exhibits edema and tenderness (B hands are very tender w/decr ROM).  B wrists are tender and swollen R>L R knee is swollen  Lymphadenopathy:    She has no cervical adenopathy.  Neurological: She displays normal reflexes. No cranial nerve deficit. She exhibits normal muscle tone. Coordination normal.  Skin: No rash noted. No erythema. No pallor.  Psychiatric: She has a normal mood and affect. Her behavior is normal. Judgment and thought content normal.  Not tearful  Tearful Lab Results  Component Value Date   WBC 9.1 01/27/2012   HGB 16.3* 01/27/2012   HCT 49.7* 01/27/2012   PLT 255.0 01/27/2012   GLUCOSE 102* 01/27/2012   CHOL 269* 01/27/2012   TRIG 129.0 01/27/2012   HDL 51.30 01/27/2012   LDLDIRECT 194.4 01/27/2012   ALT 28 01/27/2012   AST 27 01/27/2012   NA 138 01/27/2012   K 5.2* 01/27/2012   CL 100 01/27/2012   CREATININE 0.7 01/27/2012   BUN 15 01/27/2012   CO2 30 01/27/2012   TSH 4.37 03/04/2012      I personally provided the Breo inhaler use teaching. After the teaching patient was able to demonstrate it's use effectively. All questions were answered  Assessment & Plan:

## 2012-09-19 NOTE — Patient Instructions (Addendum)
Use over-the-counter  "cold" medicines  such as "Tylenol cold" , "Advil cold",  "Mucinex" or" Mucinex D"  for cough and congestion.   Avoid decongestants if you have high blood pressure and use "Afrin" nasal spray for nasal congestion as directed instead. Use" Delsym" or" Robitussin" cough syrup varietis for cough.  You can use plain "Tylenol" or "Advil" for fever, chills and achyness.  Please, make an appointment if you are not better or if you're worse.  

## 2012-09-20 ENCOUNTER — Telehealth: Payer: Self-pay | Admitting: Internal Medicine

## 2012-09-20 NOTE — Telephone Encounter (Signed)
Debra Farmer, please, inform patient that she has a pneumonia on the R. Take meds Thx

## 2012-09-20 NOTE — Telephone Encounter (Signed)
Pt informed

## 2012-09-21 NOTE — Assessment & Plan Note (Signed)
CXR See meds

## 2012-09-21 NOTE — Assessment & Plan Note (Signed)
Continue with current prescription therapy as reflected on the Med list. Discussed  

## 2013-01-04 ENCOUNTER — Other Ambulatory Visit: Payer: Self-pay | Admitting: Internal Medicine

## 2013-01-13 ENCOUNTER — Ambulatory Visit (INDEPENDENT_AMBULATORY_CARE_PROVIDER_SITE_OTHER): Payer: PRIVATE HEALTH INSURANCE | Admitting: Internal Medicine

## 2013-01-13 ENCOUNTER — Encounter: Payer: Self-pay | Admitting: Internal Medicine

## 2013-01-13 VITALS — BP 130/60 | HR 76 | Temp 97.0°F | Resp 16 | Wt 122.0 lb

## 2013-01-13 DIAGNOSIS — M129 Arthropathy, unspecified: Secondary | ICD-10-CM

## 2013-01-13 DIAGNOSIS — M7989 Other specified soft tissue disorders: Secondary | ICD-10-CM

## 2013-01-13 DIAGNOSIS — M130 Polyarthritis, unspecified: Secondary | ICD-10-CM

## 2013-01-13 MED ORDER — ZOLPIDEM TARTRATE 10 MG PO TABS: 10.0000 mg | ORAL_TABLET | Freq: Every evening | ORAL | Status: DC | PRN

## 2013-01-13 MED ORDER — TRAMADOL HCL 50 MG PO TABS: ORAL_TABLET | ORAL | Status: DC

## 2013-01-13 NOTE — Patient Instructions (Addendum)
Use gentle heat over left arm  Gluten free trial (no wheat products) x4-6 weeks. OK to use Gluten-free bread and pasta. Milk free trial (no milk, ice cream and yogurt) x4 weeks. OK to use almond or soy milk.

## 2013-01-13 NOTE — Progress Notes (Signed)
Subjective:     HPI   C/o B wrists are swollen and stiff. She saw Dr Margorie John - Dx RA - Humira suggested. She is on 10 mg Predn a day  Her knee was tapped by Dr Thomasena Edis. Her ANA was up. Predn taber did help, however all sx relapsed when she finished the last pill 3-4 d ago...  C/o insomnia  She is retiring in Jan 2014  Review of Systems  Constitutional: Positive for fatigue. Negative for fever, activity change and unexpected weight change.  Respiratory: Negative for cough.   Cardiovascular: Negative for chest pain and palpitations.  Musculoskeletal: Positive for joint swelling, arthralgias (R hip) and gait problem. Negative for back pain.  Skin: Negative for pallor and rash.  Psychiatric/Behavioral: Positive for sleep disturbance (due to pain). Negative for suicidal ideas and decreased concentration. The patient is not nervous/anxious.    Wt Readings from Last 3 Encounters:  01/13/13 122 lb (55.339 kg)  09/19/12 127 lb (57.607 kg)  09/01/12 130 lb 3.2 oz (59.058 kg)   BP Readings from Last 3 Encounters:  01/13/13 130/60  09/19/12 110/60  09/01/12 120/62         Objective:   Physical Exam  Constitutional: She appears well-developed and well-nourished. No distress.  HENT:  Head: Normocephalic.  Right Ear: External ear normal.  Left Ear: External ear normal.  Nose: Nose normal.  Mouth/Throat: Oropharynx is clear and moist.  Eyes: Conjunctivae are normal. Pupils are equal, round, and reactive to light. Right eye exhibits no discharge. Left eye exhibits no discharge.  Neck: Normal range of motion. Neck supple. No JVD present. No tracheal deviation present. No thyromegaly present.  Cardiovascular: Normal rate, regular rhythm and normal heart sounds.   No murmur heard. Pulmonary/Chest: No stridor. No respiratory distress. She has no wheezes. She has no rales.  Abdominal: Soft. Bowel sounds are normal. She exhibits no distension and no mass. There is no tenderness.  There is no rebound and no guarding.  Musculoskeletal: She exhibits edema and tenderness (B hands are very tender w/decr ROM).  B wrists are tender and swollen R>L R knee is swollen A mass 4x4 cm over L biceps  Lymphadenopathy:    She has no cervical adenopathy.  Neurological: She displays normal reflexes. No cranial nerve deficit. She exhibits normal muscle tone. Coordination normal.  Skin: No rash noted. No erythema. No pallor.  Psychiatric: She has a normal mood and affect. Her behavior is normal. Judgment and thought content normal.  Not tearful  Tearful Lab Results  Component Value Date   WBC 9.1 01/27/2012   HGB 16.3* 01/27/2012   HCT 49.7* 01/27/2012   PLT 255.0 01/27/2012   GLUCOSE 102* 01/27/2012   CHOL 269* 01/27/2012   TRIG 129.0 01/27/2012   HDL 51.30 01/27/2012   LDLDIRECT 194.4 01/27/2012   ALT 28 01/27/2012   AST 27 01/27/2012   NA 138 01/27/2012   K 5.2* 01/27/2012   CL 100 01/27/2012   CREATININE 0.7 01/27/2012   BUN 15 01/27/2012   CO2 30 01/27/2012   TSH 4.37 03/04/2012      Procedure Note :    Procedure :   Point of care (POC) sonography examination   Indication: L biceps swelling   Equipment used: Sonosite M-Turbo with HFL38x/13-6 MHz transducer linear probe. The images were stored in the unit and later transferred in storage.  The patient was placed in a decubitus position.  This study revealed a hypoechoic  cm lesion in C/w partial biceps tendon/prox biceps muscle junction tear w/hematoma.    Impression:  L partial biceps tendon/prox biceps muscle junction tear w/hematoma.            Assessment & Plan:

## 2013-01-15 DIAGNOSIS — M7989 Other specified soft tissue disorders: Secondary | ICD-10-CM | POA: Insufficient documentation

## 2013-01-15 NOTE — Assessment & Plan Note (Addendum)
8/14 [place]:  L partial biceps tendon/prox biceps muscle junction tear w/hematoma.  ACE wrap

## 2013-01-15 NOTE — Assessment & Plan Note (Signed)
9/13 progressing 3/14 relapsing Dr Nickola Major

## 2013-02-14 ENCOUNTER — Other Ambulatory Visit: Payer: Self-pay | Admitting: Internal Medicine

## 2013-04-11 ENCOUNTER — Other Ambulatory Visit: Payer: Self-pay | Admitting: Internal Medicine

## 2013-04-17 ENCOUNTER — Telehealth: Payer: Self-pay

## 2013-04-17 NOTE — Telephone Encounter (Signed)
Medication was phoned into pharmacy.

## 2013-06-11 ENCOUNTER — Other Ambulatory Visit: Payer: Self-pay | Admitting: Internal Medicine

## 2013-06-15 ENCOUNTER — Other Ambulatory Visit: Payer: Self-pay | Admitting: Internal Medicine

## 2013-06-16 NOTE — Telephone Encounter (Signed)
Faxed hardcopy to [facility]

## 2013-06-19 ENCOUNTER — Encounter: Payer: Self-pay | Admitting: Internal Medicine

## 2013-06-19 ENCOUNTER — Ambulatory Visit (INDEPENDENT_AMBULATORY_CARE_PROVIDER_SITE_OTHER): Payer: PRIVATE HEALTH INSURANCE | Admitting: Internal Medicine

## 2013-06-19 VITALS — BP 132/78 | HR 80 | Temp 97.6°F | Resp 16 | Wt 129.0 lb

## 2013-06-19 DIAGNOSIS — M129 Arthropathy, unspecified: Secondary | ICD-10-CM

## 2013-06-19 DIAGNOSIS — Z23 Encounter for immunization: Secondary | ICD-10-CM

## 2013-06-19 DIAGNOSIS — F17200 Nicotine dependence, unspecified, uncomplicated: Secondary | ICD-10-CM

## 2013-06-19 DIAGNOSIS — J449 Chronic obstructive pulmonary disease, unspecified: Secondary | ICD-10-CM

## 2013-06-19 DIAGNOSIS — H538 Other visual disturbances: Secondary | ICD-10-CM

## 2013-06-19 DIAGNOSIS — R5381 Other malaise: Secondary | ICD-10-CM

## 2013-06-19 DIAGNOSIS — R5383 Other fatigue: Secondary | ICD-10-CM

## 2013-06-19 DIAGNOSIS — M130 Polyarthritis, unspecified: Secondary | ICD-10-CM

## 2013-06-19 MED ORDER — TRAMADOL HCL 50 MG PO TABS: ORAL_TABLET | ORAL | Status: DC

## 2013-06-19 MED ORDER — ADALIMUMAB 40 MG/0.8ML ~~LOC~~ KIT: 40.0000 mg | PACK | SUBCUTANEOUS | Status: DC

## 2013-06-19 MED ORDER — ZOLPIDEM TARTRATE 10 MG PO TABS: 10.0000 mg | ORAL_TABLET | Freq: Every evening | ORAL | Status: DC | PRN

## 2013-06-19 NOTE — Progress Notes (Signed)
   Subjective:     HPI  F/u RA - now on Humira and prednisone F/u insomnia, COPD  She is retired in Jan 2014  Review of Systems  Constitutional: Positive for fatigue. Negative for fever, activity change and unexpected weight change.  Respiratory: Negative for cough.   Cardiovascular: Negative for chest pain and palpitations.  Musculoskeletal: Positive for arthralgias (R hip), gait problem and joint swelling. Negative for back pain.  Skin: Negative for pallor and rash.  Psychiatric/Behavioral: Positive for sleep disturbance (due to pain). Negative for suicidal ideas and decreased concentration. The patient is not nervous/anxious.    Wt Readings from Last 3 Encounters:  06/19/13 129 lb (58.514 kg)  01/13/13 122 lb (55.339 kg)  09/19/12 127 lb (57.607 kg)   BP Readings from Last 3 Encounters:  06/19/13 132/78  01/13/13 130/60  09/19/12 110/60         Objective:   Physical Exam  Constitutional: She appears well-developed and well-nourished. No distress.  HENT:  Head: Normocephalic.  Right Ear: External ear normal.  Left Ear: External ear normal.  Nose: Nose normal.  Mouth/Throat: Oropharynx is clear and moist.  Eyes: Conjunctivae are normal. Pupils are equal, round, and reactive to light. Right eye exhibits no discharge. Left eye exhibits no discharge.  Neck: Normal range of motion. Neck supple. No JVD present. No tracheal deviation present. No thyromegaly present.  Cardiovascular: Normal rate, regular rhythm and normal heart sounds.   No murmur heard. Pulmonary/Chest: No stridor. No respiratory distress. She has no wheezes. She has no rales.  Abdominal: Soft. Bowel sounds are normal. She exhibits no distension and no mass. There is no tenderness. There is no rebound and no guarding.  Musculoskeletal: She exhibits edema and tenderness (B hands are very tender w/decr ROM).  B wrists are tender and swollen R>L R knee is swollen  Lymphadenopathy:    She has no cervical  adenopathy.  Neurological: She displays normal reflexes. No cranial nerve deficit. She exhibits normal muscle tone. Coordination normal.  Skin: No rash noted. No erythema. No pallor.  Psychiatric: She has a normal mood and affect. Her behavior is normal. Judgment and thought content normal.  Not tearful  Tearful Lab Results  Component Value Date   WBC 9.1 01/27/2012   HGB 16.3* 01/27/2012   HCT 49.7* 01/27/2012   PLT 255.0 01/27/2012   GLUCOSE 102* 01/27/2012   CHOL 269* 01/27/2012   TRIG 129.0 01/27/2012   HDL 51.30 01/27/2012   LDLDIRECT 194.4 01/27/2012   ALT 28 01/27/2012   AST 27 01/27/2012   NA 138 01/27/2012   K 5.2* 01/27/2012   CL 100 01/27/2012   CREATININE 0.7 01/27/2012   BUN 15 01/27/2012   CO2 30 01/27/2012   TSH 4.37 03/04/2012              Assessment & Plan:

## 2013-06-19 NOTE — Assessment & Plan Note (Signed)
Better w/pain control

## 2013-06-19 NOTE — Progress Notes (Signed)
Pre visit review using our clinic review tool, if applicable. No additional management support is needed unless otherwise documented below in the visit note. 

## 2013-06-19 NOTE — Assessment & Plan Note (Signed)
On Humira+ Prednisone

## 2013-06-19 NOTE — Assessment & Plan Note (Signed)
Discussed needs to quit 

## 2013-06-19 NOTE — Assessment & Plan Note (Signed)
Continue with current prescription therapy as reflected on the Med list.  

## 2013-06-19 NOTE — Patient Instructions (Signed)
Gluten free trial (no wheat products) for 4-6 weeks. OK to use gluten-free bread and gluten-free pasta.  Milk free trial (no milk, ice cream, cheese and yogurt) for 4-6 weeks. OK to use almond, coconut, rice milk. "Almond breeze" brand tastes good.  

## 2013-07-17 ENCOUNTER — Telehealth: Payer: Self-pay | Admitting: Internal Medicine

## 2013-07-17 NOTE — Telephone Encounter (Signed)
Relevant patient education mailed to patient.  

## 2013-07-20 ENCOUNTER — Other Ambulatory Visit: Payer: Self-pay | Admitting: Internal Medicine

## 2013-07-21 ENCOUNTER — Other Ambulatory Visit: Payer: Self-pay | Admitting: Internal Medicine

## 2013-09-13 ENCOUNTER — Other Ambulatory Visit (INDEPENDENT_AMBULATORY_CARE_PROVIDER_SITE_OTHER): Payer: PRIVATE HEALTH INSURANCE

## 2013-09-13 ENCOUNTER — Ambulatory Visit (INDEPENDENT_AMBULATORY_CARE_PROVIDER_SITE_OTHER): Payer: PRIVATE HEALTH INSURANCE | Admitting: Internal Medicine

## 2013-09-13 ENCOUNTER — Encounter: Payer: Self-pay | Admitting: Internal Medicine

## 2013-09-13 VITALS — BP 118/60 | HR 80 | Temp 97.4°F | Resp 16 | Ht 59.0 in | Wt 129.0 lb

## 2013-09-13 DIAGNOSIS — Z23 Encounter for immunization: Secondary | ICD-10-CM

## 2013-09-13 DIAGNOSIS — M069 Rheumatoid arthritis, unspecified: Secondary | ICD-10-CM

## 2013-09-13 DIAGNOSIS — Z0000 Encounter for general adult medical examination without abnormal findings: Secondary | ICD-10-CM

## 2013-09-13 DIAGNOSIS — Z129 Encounter for screening for malignant neoplasm, site unspecified: Secondary | ICD-10-CM

## 2013-09-13 DIAGNOSIS — F17200 Nicotine dependence, unspecified, uncomplicated: Secondary | ICD-10-CM

## 2013-09-13 DIAGNOSIS — M129 Arthropathy, unspecified: Secondary | ICD-10-CM

## 2013-09-13 DIAGNOSIS — K219 Gastro-esophageal reflux disease without esophagitis: Secondary | ICD-10-CM

## 2013-09-13 DIAGNOSIS — M130 Polyarthritis, unspecified: Secondary | ICD-10-CM

## 2013-09-13 LAB — BASIC METABOLIC PANEL
BUN: 11 mg/dL (ref 6–23)
CALCIUM: 9.5 mg/dL (ref 8.4–10.5)
CHLORIDE: 99 meq/L (ref 96–112)
CO2: 31 meq/L (ref 19–32)
Creatinine, Ser: 0.7 mg/dL (ref 0.4–1.2)
GFR: 93.49 mL/min (ref >60.00)
GLUCOSE: 93 mg/dL (ref 70–99)
POTASSIUM: 4 meq/L (ref 3.5–5.1)
SODIUM: 136 meq/L (ref 135–145)

## 2013-09-13 LAB — HEPATIC FUNCTION PANEL
ALBUMIN: 3.7 g/dL (ref 3.5–5.2)
ALT: 22 U/L (ref 0–35)
AST: 23 U/L (ref 0–37)
Alkaline Phosphatase: 97 U/L (ref 39–117)
Bilirubin, Direct: 0 mg/dL (ref 0.0–0.3)
Total Bilirubin: 0.4 mg/dL (ref 0.3–1.2)
Total Protein: 7.2 g/dL (ref 6.0–8.3)

## 2013-09-13 LAB — URINALYSIS
BILIRUBIN URINE: NEGATIVE
HGB URINE DIPSTICK: NEGATIVE
Ketones, ur: NEGATIVE
Leukocytes, UA: NEGATIVE
Nitrite: NEGATIVE
PH: 6 (ref 5.0–8.0)
Specific Gravity, Urine: 1.01 (ref 1.000–1.030)
TOTAL PROTEIN, URINE-UPE24: NEGATIVE
Urine Glucose: NEGATIVE
Urobilinogen, UA: 0.2 (ref 0.0–1.0)

## 2013-09-13 LAB — CBC WITH DIFFERENTIAL/PLATELET
BASOS PCT: 0.4 % (ref 0.0–3.0)
Basophils Absolute: 0 10*3/uL (ref 0.0–0.1)
Eosinophils Absolute: 0.1 10*3/uL (ref 0.0–0.7)
Eosinophils Relative: 1 % (ref 0.0–5.0)
HCT: 47.6 % — ABNORMAL HIGH (ref 36.0–46.0)
Hemoglobin: 15.8 g/dL — ABNORMAL HIGH (ref 12.0–15.0)
Lymphocytes Relative: 34.8 % (ref 12.0–46.0)
Lymphs Abs: 3.1 10*3/uL (ref 0.7–4.0)
MCHC: 33.2 g/dL (ref 30.0–36.0)
MCV: 88.8 fl (ref 78.0–100.0)
MONO ABS: 0.7 10*3/uL (ref 0.1–1.0)
Monocytes Relative: 7.7 % (ref 3.0–12.0)
NEUTROS PCT: 56.1 % (ref 43.0–77.0)
Neutro Abs: 4.9 10*3/uL (ref 1.4–7.7)
PLATELETS: 247 10*3/uL (ref 150.0–400.0)
RBC: 5.36 Mil/uL — ABNORMAL HIGH (ref 3.87–5.11)
RDW: 14.1 % (ref 11.5–14.6)
WBC: 8.8 10*3/uL (ref 4.5–10.5)

## 2013-09-13 LAB — LIPID PANEL
CHOL/HDL RATIO: 6
Cholesterol: 261 mg/dL — ABNORMAL HIGH (ref 0–200)
HDL: 40.7 mg/dL (ref >39.00)
LDL CALC: 166 mg/dL — AB (ref 0–99)
TRIGLYCERIDES: 272 mg/dL — AB (ref 0.0–149.0)
VLDL: 54.4 mg/dL — AB (ref 0.0–40.0)

## 2013-09-13 LAB — VITAMIN B12: Vitamin B-12: 1278 pg/mL — ABNORMAL HIGH (ref 211–911)

## 2013-09-13 LAB — TSH: TSH: 2.43 u[IU]/mL (ref 0.35–5.50)

## 2013-09-13 MED ORDER — TRAMADOL HCL 50 MG PO TABS: ORAL_TABLET | ORAL | Status: DC

## 2013-09-13 MED ORDER — RANITIDINE HCL 150 MG PO TABS: 150.0000 mg | ORAL_TABLET | Freq: Two times a day (BID) | ORAL | Status: DC

## 2013-09-13 MED ORDER — ZOLPIDEM TARTRATE 10 MG PO TABS: ORAL_TABLET | ORAL | Status: DC

## 2013-09-13 NOTE — Assessment & Plan Note (Signed)
Needs to quit.  

## 2013-09-13 NOTE — Assessment & Plan Note (Addendum)
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

## 2013-09-13 NOTE — Progress Notes (Signed)
   Subjective:     HPI The patient is here for a wellness exam.  F/u RA - now on Humira and tapering off prednisone F/u insomnia, COPD - doing ok  She was retired in Jan 2014, working again  Review of Systems  Constitutional: Positive for fatigue (better). Negative for fever, activity change and unexpected weight change.  Respiratory: Negative for cough.   Cardiovascular: Negative for chest pain and palpitations.  Gastrointestinal: Negative for nausea, vomiting, blood in stool and abdominal distention.  Genitourinary: Negative for urgency, hematuria and flank pain.  Musculoskeletal: Positive for arthralgias (better). Negative for back pain, gait problem, joint swelling and neck stiffness.  Skin: Negative for pallor and rash.  Neurological: Negative for seizures, syncope and weakness.  Psychiatric/Behavioral: Positive for sleep disturbance (better). Negative for suicidal ideas and decreased concentration. The patient is not nervous/anxious.    Wt Readings from Last 3 Encounters:  09/13/13 129 lb (58.514 kg)  06/19/13 129 lb (58.514 kg)  01/13/13 122 lb (55.339 kg)   BP Readings from Last 3 Encounters:  09/13/13 118/60  06/19/13 132/78  01/13/13 130/60         Objective:   Physical Exam  Constitutional: She appears well-developed and well-nourished. No distress.  HENT:  Head: Normocephalic.  Right Ear: External ear normal.  Left Ear: External ear normal.  Nose: Nose normal.  Mouth/Throat: Oropharynx is clear and moist.  Eyes: Conjunctivae are normal. Pupils are equal, round, and reactive to light. Right eye exhibits no discharge. Left eye exhibits no discharge.  Neck: Normal range of motion. Neck supple. No JVD present. No tracheal deviation present. No thyromegaly present.  Cardiovascular: Normal rate, regular rhythm and normal heart sounds.   No murmur heard. Pulmonary/Chest: No stridor. No respiratory distress. She has no wheezes. She has no rales.  Abdominal: Soft.  Bowel sounds are normal. She exhibits no distension and no mass. There is no tenderness. There is no rebound and no guarding.  Musculoskeletal: She exhibits tenderness (B hands are very tender w/decr ROM). She exhibits no edema.  B wrists, knees are not tender and not swollen   Lymphadenopathy:    She has no cervical adenopathy.  Neurological: She displays normal reflexes. No cranial nerve deficit. She exhibits normal muscle tone. Coordination normal.  Skin: No rash noted. No erythema. No pallor.  Psychiatric: She has a normal mood and affect. Her behavior is normal. Judgment and thought content normal.  Not tearful   Lab Results  Component Value Date   WBC 8.8 09/13/2013   HGB 15.8* 09/13/2013   HCT 47.6* 09/13/2013   PLT 247.0 09/13/2013   GLUCOSE 93 09/13/2013   CHOL 261* 09/13/2013   TRIG 272.0* 09/13/2013   HDL 40.70 09/13/2013   LDLDIRECT 194.4 01/27/2012   LDLCALC 166* 09/13/2013   ALT 22 09/13/2013   AST 23 09/13/2013   NA 136 09/13/2013   K 4.0 09/13/2013   CL 99 09/13/2013   CREATININE 0.7 09/13/2013   BUN 11 09/13/2013   CO2 31 09/13/2013   TSH 2.43 09/13/2013              Assessment & Plan:

## 2013-09-13 NOTE — Assessment & Plan Note (Addendum)
We discussed age appropriate health related issues, including available/recomended screening tests and vaccinations. We discussed a need for adhering to healthy diet and exercise. Labs/EKG were reviewed/ordered. All questions were answered. Prevnar Dr Olevia Perches did a colon Mammo

## 2013-09-13 NOTE — Progress Notes (Signed)
Pre visit review using our clinic review tool, if applicable. No additional management support is needed unless otherwise documented below in the visit note. 

## 2013-09-13 NOTE — Assessment & Plan Note (Signed)
Ranitidine bid 

## 2013-09-19 ENCOUNTER — Encounter: Payer: Self-pay | Admitting: *Deleted

## 2013-09-22 ENCOUNTER — Other Ambulatory Visit: Payer: Self-pay | Admitting: Internal Medicine

## 2013-09-22 DIAGNOSIS — Z1231 Encounter for screening mammogram for malignant neoplasm of breast: Secondary | ICD-10-CM

## 2013-10-19 ENCOUNTER — Ambulatory Visit: Payer: PRIVATE HEALTH INSURANCE

## 2013-11-09 ENCOUNTER — Ambulatory Visit: Payer: PRIVATE HEALTH INSURANCE

## 2013-12-13 ENCOUNTER — Other Ambulatory Visit: Payer: Self-pay | Admitting: Internal Medicine

## 2014-01-08 DIAGNOSIS — Z79899 Other long term (current) drug therapy: Secondary | ICD-10-CM | POA: Diagnosis not present

## 2014-01-08 DIAGNOSIS — M069 Rheumatoid arthritis, unspecified: Secondary | ICD-10-CM | POA: Diagnosis not present

## 2014-01-08 DIAGNOSIS — R894 Abnormal immunological findings in specimens from other organs, systems and tissues: Secondary | ICD-10-CM | POA: Diagnosis not present

## 2014-01-08 DIAGNOSIS — M2550 Pain in unspecified joint: Secondary | ICD-10-CM | POA: Diagnosis not present

## 2014-03-06 ENCOUNTER — Encounter: Payer: Self-pay | Admitting: Internal Medicine

## 2014-03-06 ENCOUNTER — Ambulatory Visit (INDEPENDENT_AMBULATORY_CARE_PROVIDER_SITE_OTHER): Payer: Managed Care, Other (non HMO) | Admitting: Internal Medicine

## 2014-03-06 VITALS — BP 140/76 | HR 69 | Temp 98.4°F | Wt 132.0 lb

## 2014-03-06 DIAGNOSIS — M130 Polyarthritis, unspecified: Secondary | ICD-10-CM

## 2014-03-06 DIAGNOSIS — Z23 Encounter for immunization: Secondary | ICD-10-CM | POA: Diagnosis not present

## 2014-03-06 DIAGNOSIS — M069 Rheumatoid arthritis, unspecified: Secondary | ICD-10-CM

## 2014-03-06 DIAGNOSIS — R062 Wheezing: Secondary | ICD-10-CM | POA: Diagnosis not present

## 2014-03-06 DIAGNOSIS — B370 Candidal stomatitis: Secondary | ICD-10-CM

## 2014-03-06 DIAGNOSIS — Z0000 Encounter for general adult medical examination without abnormal findings: Secondary | ICD-10-CM

## 2014-03-06 DIAGNOSIS — J449 Chronic obstructive pulmonary disease, unspecified: Secondary | ICD-10-CM

## 2014-03-06 DIAGNOSIS — F17200 Nicotine dependence, unspecified, uncomplicated: Secondary | ICD-10-CM | POA: Diagnosis not present

## 2014-03-06 DIAGNOSIS — M129 Arthropathy, unspecified: Secondary | ICD-10-CM

## 2014-03-06 MED ORDER — CEFUROXIME AXETIL 250 MG PO TABS: 250.0000 mg | ORAL_TABLET | Freq: Two times a day (BID) | ORAL | Status: DC

## 2014-03-06 MED ORDER — TRAMADOL HCL 50 MG PO TABS: ORAL_TABLET | ORAL | Status: DC

## 2014-03-06 MED ORDER — NYSTATIN 100000 UNIT/ML MT SUSP: 500000.0000 [IU] | Freq: Four times a day (QID) | OROMUCOSAL | Status: DC

## 2014-03-06 MED ORDER — PREDNISONE 5 MG PO TABS: 7.5000 mg | ORAL_TABLET | Freq: Every day | ORAL | Status: DC

## 2014-03-06 MED ORDER — FLUTICASONE FUROATE-VILANTEROL 100-25 MCG/INH IN AEPB: 1.0000 | INHALATION_SPRAY | Freq: Every day | RESPIRATORY_TRACT | Status: DC

## 2014-03-06 MED ORDER — ZOLPIDEM TARTRATE 10 MG PO TABS: ORAL_TABLET | ORAL | Status: DC

## 2014-03-06 MED ORDER — METHYLPREDNISOLONE ACETATE 80 MG/ML IJ SUSP
80.0000 mg | Freq: Once | INTRAMUSCULAR | Status: AC
  Administered 2014-03-06: 80 mg via INTRAMUSCULAR

## 2014-03-06 NOTE — Progress Notes (Signed)
Pre visit review using our clinic review tool, if applicable. No additional management support is needed unless otherwise documented below in the visit note. 

## 2014-03-06 NOTE — Assessment & Plan Note (Signed)
Depomedrol 80 mg im 

## 2014-03-06 NOTE — Assessment & Plan Note (Signed)
Nystatin po 

## 2014-03-06 NOTE — Assessment & Plan Note (Signed)
Refractory - discussed again

## 2014-03-06 NOTE — Assessment & Plan Note (Addendum)
Exacerbation Depo-medrol 80 mg im Po abx

## 2014-03-11 NOTE — Assessment & Plan Note (Signed)
Continue with current prescription therapy as reflected on the Med list.  

## 2014-03-11 NOTE — Progress Notes (Signed)
   Subjective:     HPI  F/u RA - now on Humira and tapering off prednisone F/u insomnia, COPD - doing ok  She was retired in Jan 2014, working again  Review of Systems  Constitutional: Positive for fatigue (better). Negative for fever, activity change and unexpected weight change.  Respiratory: Negative for cough.   Cardiovascular: Negative for chest pain and palpitations.  Gastrointestinal: Negative for nausea, vomiting, blood in stool and abdominal distention.  Genitourinary: Negative for urgency, hematuria and flank pain.  Musculoskeletal: Positive for arthralgias (better). Negative for back pain, gait problem, joint swelling and neck stiffness.  Skin: Negative for pallor and rash.  Neurological: Negative for seizures, syncope and weakness.  Psychiatric/Behavioral: Positive for sleep disturbance (better). Negative for suicidal ideas and decreased concentration. The patient is not nervous/anxious.    Wt Readings from Last 3 Encounters:  03/06/14 132 lb (59.875 kg)  09/13/13 129 lb (58.514 kg)  06/19/13 129 lb (58.514 kg)   BP Readings from Last 3 Encounters:  03/06/14 140/76  09/13/13 118/60  06/19/13 132/78         Objective:   Physical Exam  Constitutional: She appears well-developed and well-nourished. No distress.  HENT:  Head: Normocephalic.  Right Ear: External ear normal.  Left Ear: External ear normal.  Nose: Nose normal.  Mouth/Throat: Oropharynx is clear and moist.  Eyes: Conjunctivae are normal. Pupils are equal, round, and reactive to light. Right eye exhibits no discharge. Left eye exhibits no discharge.  Neck: Normal range of motion. Neck supple. No JVD present. No tracheal deviation present. No thyromegaly present.  Cardiovascular: Normal rate, regular rhythm and normal heart sounds.   No murmur heard. Pulmonary/Chest: No stridor. No respiratory distress. She has no wheezes. She has no rales.  Abdominal: Soft. Bowel sounds are normal. She exhibits no  distension and no mass. There is no tenderness. There is no rebound and no guarding.  Musculoskeletal: She exhibits tenderness (B hands are very tender w/decr ROM). She exhibits no edema.  B wrists, knees are not tender and not swollen   Lymphadenopathy:    She has no cervical adenopathy.  Neurological: She displays normal reflexes. No cranial nerve deficit. She exhibits normal muscle tone. Coordination normal.  Skin: No rash noted. No erythema. No pallor.  Psychiatric: She has a normal mood and affect. Her behavior is normal. Judgment and thought content normal.  Not tearful   Lab Results  Component Value Date   WBC 8.8 09/13/2013   HGB 15.8* 09/13/2013   HCT 47.6* 09/13/2013   PLT 247.0 09/13/2013   GLUCOSE 93 09/13/2013   CHOL 261* 09/13/2013   TRIG 272.0* 09/13/2013   HDL 40.70 09/13/2013   LDLDIRECT 194.4 01/27/2012   LDLCALC 166* 09/13/2013   ALT 22 09/13/2013   AST 23 09/13/2013   NA 136 09/13/2013   K 4.0 09/13/2013   CL 99 09/13/2013   CREATININE 0.7 09/13/2013   BUN 11 09/13/2013   CO2 31 09/13/2013   TSH 2.43 09/13/2013              Assessment & Plan:

## 2014-03-12 DIAGNOSIS — M25549 Pain in joints of unspecified hand: Secondary | ICD-10-CM | POA: Diagnosis not present

## 2014-03-12 DIAGNOSIS — M069 Rheumatoid arthritis, unspecified: Secondary | ICD-10-CM | POA: Diagnosis not present

## 2014-04-05 ENCOUNTER — Ambulatory Visit (INDEPENDENT_AMBULATORY_CARE_PROVIDER_SITE_OTHER): Payer: Managed Care, Other (non HMO) | Admitting: Family

## 2014-04-05 ENCOUNTER — Encounter: Payer: Self-pay | Admitting: Family

## 2014-04-05 VITALS — BP 120/74 | HR 66 | Temp 97.7°F | Resp 18 | Ht 60.0 in | Wt 128.1 lb

## 2014-04-05 DIAGNOSIS — B029 Zoster without complications: Secondary | ICD-10-CM | POA: Diagnosis not present

## 2014-04-05 MED ORDER — VALACYCLOVIR HCL 1 G PO TABS: 1000.0000 mg | ORAL_TABLET | Freq: Three times a day (TID) | ORAL | Status: DC

## 2014-04-05 NOTE — Progress Notes (Signed)
   Subjective:    Patient ID: Debra Farmer, female    DOB: [birth year], 68 y.o.   MRN: [id number]  Chief Complaint  Patient presents with  . Possible shingles    HPI:  Debra Farmer is a 68 y.o. female who presents today for possible shingles.   Started 2 days ago and posterior to axilla and progressed anteriorly towards her left breast. Slight pain noted, already taking tramadol for arthritis. Has been vaccinated for shingles. Noticed redness and is bubbled up.  Has not tried any treatments and nothing makes it better or worse.   Allergies  Allergen Reactions  . Aspirin     REACTION: upset stomach  . Codeine Sulfate   . Nabumetone    Current Outpatient Prescriptions on File Prior to Visit  Medication Sig Dispense Refill  . adalimumab (HUMIRA) 40 MG/0.8ML injection Inject 0.8 mLs (40 mg total) into the skin every 14 (fourteen) days.  2 each  0  . predniSONE (DELTASONE) 5 MG tablet Take 1.5 tablets (7.5 mg total) by mouth daily with breakfast.  100 tablet  3  . traMADol (ULTRAM) 50 MG tablet TAKE 1 OR 2 TABLETS BY MOUTH TWICE A DAY AS NEEDED FOR PAIN  120 tablet  2  . zolpidem (AMBIEN) 10 MG tablet TAKE 1 TABLET BY MOUTH AT BEDTIME AS NEEDED FOR SLEEP  30 tablet  5  . aspirin EC 81 MG EC tablet Take 81 mg by mouth daily.        . cefUROXime (CEFTIN) 250 MG tablet Take 1 tablet (250 mg total) by mouth 2 (two) times daily.  14 tablet  0  . Fluticasone Furoate-Vilanterol (BREO ELLIPTA) 100-25 MCG/INH AEPB Inhale 1 Act into the lungs daily.  1 each  11  . folic acid (FOLVITE) 1 MG tablet Take 1 tablet (1 mg total) by mouth daily.  100 tablet  3  . nystatin (MYCOSTATIN) 100000 UNIT/ML suspension Take 5 mLs (500,000 Units total) by mouth 4 (four) times daily. Swish, hold, swallow  240 mL  0  . ranitidine (ZANTAC) 150 MG tablet Take 1 tablet (150 mg total) by mouth 2 (two) times daily.  180 tablet  3   No current facility-administered medications on file prior to visit.     Review of Systems    See HPI   Objective:    BP 120/74  Pulse 66  Temp(Src) 97.7 F (36.5 C) (Oral)  Resp 18  Ht 5' (1.524 m)  Wt 128 lb 1.9 oz (58.115 kg)  BMI 25.02 kg/m2  SpO2 91% Nursing note and vital signs reviewed. Female chaperone present for exam. Physical Exam  Constitutional: She is oriented to person, place, and time. She appears well-developed and well-nourished. No distress.  Cardiovascular: Normal rate, regular rhythm and normal heart sounds.   Pulmonary/Chest: Effort normal and breath sounds normal.  Neurological: She is alert and oriented to person, place, and time.  Skin: Skin is warm and dry.     Follows around T3 dermatome  Psychiatric: She has a normal mood and affect. Her behavior is normal. Judgment and thought content normal.       Assessment & Plan:

## 2014-04-05 NOTE — Assessment & Plan Note (Signed)
Vesicles and pattern consistent with herpes zoster. Start valtrex x 10 days. Instructed to use cool moist compresses for relief and may use over the counter medications for itching. Currently on Humira, will hold shot for one week while clearing. Follow up if symptoms worsen or fail to improve.

## 2014-04-05 NOTE — Progress Notes (Signed)
Pre visit review using our clinic review tool, if applicable. No additional management support is needed unless otherwise documented below in the visit note. 

## 2014-04-05 NOTE — Patient Instructions (Signed)
Thank you for choosing [facility].  Summary/Instructions:   Your prescription has been sent to your pharmacy.  Please let us know if your symptoms worsen or fail to improve  Please hold your humera shot for 1 week.       Shingles Shingles (herpes zoster) is an infection that is caused by the same virus that causes chickenpox (varicella). The infection causes a painful skin rash and fluid-filled blisters, which eventually break open, crust over, and heal. It may occur in any area of the body, but it usually affects only one side of the body or face. The pain of shingles usually lasts about 1 month. However, some people with shingles may develop long-term (chronic) pain in the affected area of the body. Shingles often occurs many years after the person had chickenpox. It is more common:  In people older than 50 years.  In people with weakened immune systems, such as those with HIV, AIDS, or cancer.  In people taking medicines that weaken the immune system, such as transplant medicines.  In people under great stress. CAUSES  Shingles is caused by the varicella zoster virus (VZV), which also causes chickenpox. After a person is infected with the virus, it can remain in the person's body for years in an inactive state (dormant). To cause shingles, the virus reactivates and breaks out as an infection in a nerve root. The virus can be spread from person to person (contagious) through contact with open blisters of the shingles rash. It will only spread to people who have not had chickenpox. When these people are exposed to the virus, they may develop chickenpox. They will not develop shingles. Once the blisters scab over, the person is no longer contagious and cannot spread the virus to others. SIGNS AND SYMPTOMS  Shingles shows up in stages. The initial symptoms may be pain, itching, and tingling in an area of the skin. This pain is usually described as burning, stabbing, or  throbbing.In a few days or weeks, a painful red rash will appear in the area where the pain, itching, and tingling were felt. The rash is usually on one side of the body in a band or belt-like pattern. Then, the rash usually turns into fluid-filled blisters. They will scab over and dry up in approximately 2-3 weeks. Flu-like symptoms may also occur with the initial symptoms, the rash, or the blisters. These may include:  Fever.  Chills.  Headache.  Upset stomach. DIAGNOSIS  Your health care provider will perform a skin exam to diagnose shingles. Skin scrapings or fluid samples may also be taken from the blisters. This sample will be examined under a microscope or sent to a lab for further testing. TREATMENT  There is no specific cure for shingles. Your health care provider will likely prescribe medicines to help you manage the pain, recover faster, and avoid long-term problems. This may include antiviral drugs, anti-inflammatory drugs, and pain medicines. HOME CARE INSTRUCTIONS   Take a cool bath or apply cool compresses to the area of the rash or blisters as directed. This may help with the pain and itching.   Take medicines only as directed by your health care provider.   Rest as directed by your health care provider.  Keep your rash and blisters clean with mild soap and cool water or as directed by your health care provider.  Do not pick your blisters or scratch your rash. Apply an anti-itch cream or numbing creams to the affected area as directed by  your health care provider.  Keep your shingles rash covered with a loose bandage (dressing).  Avoid skin contact with:  Babies.   Pregnant women.   Children with eczema.   Elderly people with transplants.   People with chronic illnesses, such as leukemia or AIDS.   Wear loose-fitting clothing to help ease the pain of material rubbing against the rash.  Keep all follow-up visits as directed by your health care  provider.If the area involved is on your face, you may receive a referral for a specialist, such as an eye doctor (ophthalmologist) or an ear, nose, and throat (ENT) doctor. Keeping all follow-up visits will help you avoid eye problems, chronic pain, or disability.  SEEK IMMEDIATE MEDICAL CARE IF:   You have facial pain, pain around the eye area, or loss of feeling on one side of your face.  You have ear pain or ringing in your ear.  You have loss of taste.  Your pain is not relieved with prescribed medicines.   Your redness or swelling spreads.   You have more pain and swelling.  Your condition is worsening or has changed.   You have a fever. MAKE SURE YOU:  Understand these instructions.  Will watch your condition.  Will get help right away if you are not doing well or get worse. Document Released: 06/01/2005 Document Revised: 10/16/2013 Document Reviewed: 01/14/2012 [facility] Patient Information 2015 [place], [place]. This information is not intended to replace advice given to you by your health care provider. Make sure you discuss any questions you have with your health care provider.

## 2014-04-06 ENCOUNTER — Telehealth: Payer: Self-pay | Admitting: Internal Medicine

## 2014-04-06 NOTE — Telephone Encounter (Signed)
emmi mailed  °

## 2014-04-26 ENCOUNTER — Ambulatory Visit (INDEPENDENT_AMBULATORY_CARE_PROVIDER_SITE_OTHER): Payer: Managed Care, Other (non HMO) | Admitting: Family

## 2014-04-26 ENCOUNTER — Encounter: Payer: Self-pay | Admitting: Family

## 2014-04-26 VITALS — BP 118/68 | HR 67 | Temp 98.3°F | Resp 18 | Ht 60.0 in | Wt 129.4 lb

## 2014-04-26 DIAGNOSIS — M542 Cervicalgia: Secondary | ICD-10-CM | POA: Diagnosis not present

## 2014-04-26 MED ORDER — METHOCARBAMOL 500 MG PO TABS: 500.0000 mg | ORAL_TABLET | Freq: Four times a day (QID) | ORAL | Status: DC | PRN

## 2014-04-26 NOTE — Progress Notes (Signed)
Pre visit review using our clinic review tool, if applicable. No additional management support is needed unless otherwise documented below in the visit note. 

## 2014-04-26 NOTE — Assessment & Plan Note (Signed)
Cannot rule out potential for post-varicella related pain. Believe to be mostly musculoskeletal at this time. Start robaxin for relaxation and Vivimo samples given to assist with pain. Continue to stretch neck as tolerated. Follow up if symptoms worsen or fail to improve.

## 2014-04-26 NOTE — Patient Instructions (Signed)
Thank you for choosing [facility].  Summary/Instructions:   Start [employer] robaxin as needed for pain. It may make you sleepy so be careful until you know how the medication will effect you.   Follow up if symptoms worsen or fail to improve in about 5-7.

## 2014-04-26 NOTE — Progress Notes (Signed)
Subjective:    Patient ID: Debra Farmer, female    DOB: [birth year], 68 y.o.   MRN: [id number]  Chief Complaint  Patient presents with  . Follow-up    Shingles has scabbed up and still there, says pain is in neck and ear and doesn't know if its from shingles    HPI:  Debra Farmer is a 68 y.o. female who presents today for follow up of recently diagnosed shingles.  Has completed the Valtrex and has noted that majority of vesicles have ruptured, scabbed over and dried up. There is one scabbed that still remains, otherwise she believes it is gone. Has notice neck pain that is described as sharp that started around the same time the shingles came on. Does not recall any trauma to the area. There is nothing that makes it better or worse. Has tried a heat pack but indicates it felt worse following application. Currently experiencing pain on the left upper trapezius area of her neck. Denies any changes to hearing or any new vesicles in these areas.  Allergies  Allergen Reactions  . Aspirin     REACTION: upset stomach  . Codeine Sulfate   . Nabumetone    Current Outpatient Prescriptions on File Prior to Visit  Medication Sig Dispense Refill  . adalimumab (HUMIRA) 40 MG/0.8ML injection Inject 0.8 mLs (40 mg total) into the skin every 14 (fourteen) days. 2 each 0  . aspirin EC 81 MG EC tablet Take 81 mg by mouth daily.      . cefUROXime (CEFTIN) 250 MG tablet Take 1 tablet (250 mg total) by mouth 2 (two) times daily. 14 tablet 0  . Fluticasone Furoate-Vilanterol (BREO ELLIPTA) 100-25 MCG/INH AEPB Inhale 1 Act into the lungs daily. 1 each 11  . folic acid (FOLVITE) 1 MG tablet Take 1 tablet (1 mg total) by mouth daily. 100 tablet 3  . nystatin (MYCOSTATIN) 100000 UNIT/ML suspension Take 5 mLs (500,000 Units total) by mouth 4 (four) times daily. Swish, hold, swallow 240 mL 0  . predniSONE (DELTASONE) 5 MG tablet Take 1.5 tablets (7.5 mg total) by mouth daily with breakfast. 100 tablet  3  . ranitidine (ZANTAC) 150 MG tablet Take 1 tablet (150 mg total) by mouth 2 (two) times daily. 180 tablet 3  . traMADol (ULTRAM) 50 MG tablet TAKE 1 OR 2 TABLETS BY MOUTH TWICE A DAY AS NEEDED FOR PAIN 120 tablet 2  . valACYclovir (VALTREX) 1000 MG tablet Take 1 tablet (1,000 mg total) by mouth 3 (three) times daily. 30 tablet 0  . zolpidem (AMBIEN) 10 MG tablet TAKE 1 TABLET BY MOUTH AT BEDTIME AS NEEDED FOR SLEEP 30 tablet 5   No current facility-administered medications on file prior to visit.    Review of Systems    See HPI Objective:    BP 118/68 mmHg  Pulse 67  Temp(Src) 98.3 F (36.8 C) (Oral)  Resp 18  Ht 5' (1.524 m)  Wt 129 lb 6.4 oz (58.695 kg)  BMI 25.27 kg/m2  SpO2 94% Nursing note and vital signs reviewed.  Physical Exam  Constitutional: She is oriented to person, place, and time. She appears well-developed and well-nourished. No distress.  Neck:  Decreased ROM in extension and lateral bending. No obvious signs of inflammation. Palpable tenderness of left upper trapezius.   Cardiovascular: Normal rate, regular rhythm, normal heart sounds and intact distal pulses.   Pulmonary/Chest: Effort normal and breath sounds normal.  Neurological: She is alert and oriented to  person, place, and time.  Skin: Skin is warm and dry.  Psychiatric: She has a normal mood and affect. Her behavior is normal. Judgment and thought content normal.       Assessment & Plan:

## 2014-06-11 ENCOUNTER — Other Ambulatory Visit: Payer: Self-pay | Admitting: Internal Medicine

## 2014-06-11 DIAGNOSIS — M0579 Rheumatoid arthritis with rheumatoid factor of multiple sites without organ or systems involvement: Secondary | ICD-10-CM | POA: Diagnosis not present

## 2014-06-11 DIAGNOSIS — M2550 Pain in unspecified joint: Secondary | ICD-10-CM | POA: Diagnosis not present

## 2014-06-11 DIAGNOSIS — Z79899 Other long term (current) drug therapy: Secondary | ICD-10-CM | POA: Diagnosis not present

## 2014-06-11 DIAGNOSIS — R768 Other specified abnormal immunological findings in serum: Secondary | ICD-10-CM | POA: Diagnosis not present

## 2014-06-13 ENCOUNTER — Telehealth: Payer: Self-pay | Admitting: Internal Medicine

## 2014-06-13 MED ORDER — TRAMADOL HCL 50 MG PO TABS: ORAL_TABLET | ORAL | Status: DC

## 2014-06-13 NOTE — Telephone Encounter (Signed)
Needs tramadol sent to [facility] [address].  Patient is out of med.

## 2014-06-13 NOTE — Telephone Encounter (Signed)
Called [facility] spoke with Lattie Haw gave md approval for tramadol...Johny Chess

## 2014-06-13 NOTE — Telephone Encounter (Signed)
[facility] into [facility].../lmb

## 2014-06-13 NOTE — Telephone Encounter (Signed)
Ok Thx 

## 2014-09-05 ENCOUNTER — Other Ambulatory Visit: Payer: Self-pay | Admitting: Internal Medicine

## 2014-09-06 NOTE — Telephone Encounter (Signed)
sch OV 

## 2014-09-06 NOTE — Telephone Encounter (Signed)
Pt called in requesting refill for [id number] [place] [address]

## 2014-09-13 ENCOUNTER — Ambulatory Visit: Payer: Managed Care, Other (non HMO) | Admitting: Internal Medicine

## 2014-09-19 ENCOUNTER — Ambulatory Visit (INDEPENDENT_AMBULATORY_CARE_PROVIDER_SITE_OTHER): Payer: Managed Care, Other (non HMO) | Admitting: Internal Medicine

## 2014-09-19 ENCOUNTER — Encounter: Payer: Self-pay | Admitting: Internal Medicine

## 2014-09-19 ENCOUNTER — Telehealth: Payer: Self-pay | Admitting: Internal Medicine

## 2014-09-19 VITALS — BP 130/70 | HR 78 | Wt 128.0 lb

## 2014-09-19 DIAGNOSIS — M1990 Unspecified osteoarthritis, unspecified site: Secondary | ICD-10-CM

## 2014-09-19 DIAGNOSIS — Z0000 Encounter for general adult medical examination without abnormal findings: Secondary | ICD-10-CM

## 2014-09-19 DIAGNOSIS — M069 Rheumatoid arthritis, unspecified: Secondary | ICD-10-CM | POA: Diagnosis not present

## 2014-09-19 DIAGNOSIS — M130 Polyarthritis, unspecified: Secondary | ICD-10-CM

## 2014-09-19 DIAGNOSIS — G4700 Insomnia, unspecified: Secondary | ICD-10-CM

## 2014-09-19 DIAGNOSIS — M5440 Lumbago with sciatica, unspecified side: Secondary | ICD-10-CM | POA: Diagnosis not present

## 2014-09-19 DIAGNOSIS — M542 Cervicalgia: Secondary | ICD-10-CM

## 2014-09-19 MED ORDER — TRAMADOL HCL 50 MG PO TABS: 50.0000 mg | ORAL_TABLET | Freq: Two times a day (BID) | ORAL | Status: DC | PRN

## 2014-09-19 MED ORDER — ZOLPIDEM TARTRATE 10 MG PO TABS: ORAL_TABLET | ORAL | Status: DC

## 2014-09-19 NOTE — Progress Notes (Signed)
   Subjective:     HPI  F/u RA - now on Humira and tapering off prednisone F/u insomnia, COPD - doing ok. 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 Negative for fever, activity change and unexpected weight change.  Respiratory: Negative for cough.   Cardiovascular: Negative for chest pain and palpitations.  Gastrointestinal: Negative for nausea, vomiting, blood in stool and abdominal distention.  Genitourinary: Negative for urgency, hematuria and flank pain.  Musculoskeletal: Positive for arthralgias (better). Negative for back pain, joint swelling, gait problem and neck stiffness.  Skin: Negative for pallor and rash.  Neurological: Negative for seizures, syncope and weakness.  Psychiatric/Behavioral: Positive for sleep disturbance (better). Negative for suicidal ideas and decreased concentration. The patient is not nervous/anxious.     Wt Readings from Last 3 Encounters:  09/19/14 128 lb (58.06 kg)  04/26/14 129 lb 6.4 oz (58.695 kg)  04/05/14 128 lb 1.9 oz (58.115 kg)   BP Readings from Last 3 Encounters:  09/19/14 130/70  04/26/14 118/68  04/05/14 120/74      Objective:   Physical Exam  Constitutional: She appears well-developed and well-nourished. No distress.  HENT:  Head: Normocephalic.  Right Ear: External ear normal.  Left Ear: External ear normal.  Nose: Nose normal.  Mouth/Throat: Oropharynx is clear and moist.  Eyes: Conjunctivae are normal. Pupils are equal, round, and reactive to light. Right eye exhibits no discharge. Left eye exhibits no discharge.  Neck: Normal range of motion. Neck supple. No JVD present. No tracheal deviation present. No thyromegaly present.  Cardiovascular: Normal rate, regular rhythm and normal heart sounds.   No murmur heard. Pulmonary/Chest: No stridor. No respiratory distress. She has no wheezes. She has no rales.  Abdominal:  Soft. Bowel sounds are normal. She exhibits no distension and no mass. There is no tenderness. There is no rebound and no guarding.  Musculoskeletal: She exhibits tenderness (B hands are very tender w/decr ROM). She exhibits no edema.  B wrists, knees are not tender and not swollen   Lymphadenopathy:    She has no cervical adenopathy.  Neurological: She displays normal reflexes. No cranial nerve deficit. She exhibits normal muscle tone. Coordination normal.  Skin: No rash noted. No erythema. No pallor.  Psychiatric: She has a normal mood and affect. Her behavior is normal. Judgment and thought content normal.  Not tearful   Lab Results  Component Value Date   WBC 8.8 09/13/2013   HGB 15.8* 09/13/2013   HCT 47.6* 09/13/2013   PLT 247.0 09/13/2013   GLUCOSE 93 09/13/2013   CHOL 261* 09/13/2013   TRIG 272.0* 09/13/2013   HDL 40.70 09/13/2013   LDLDIRECT 194.4 01/27/2012   LDLCALC 166* 09/13/2013   ALT 22 09/13/2013   AST 23 09/13/2013   NA 136 09/13/2013   K 4.0 09/13/2013   CL 99 09/13/2013   CREATININE 0.7 09/13/2013   BUN 11 09/13/2013   CO2 31 09/13/2013   TSH 2.43 09/13/2013            Assessment & Plan:

## 2014-09-19 NOTE — Assessment & Plan Note (Signed)
OA/RA/MSK Tramadol prn

## 2014-09-19 NOTE — Assessment & Plan Note (Signed)
Zolpidem prn  Potential benefits of a long term benzodiazepines  use as well as potential risks  and complications were explained to the patient and were aknowledged. 

## 2014-09-19 NOTE — Progress Notes (Signed)
Pre visit review using our clinic review tool, if applicable. No additional management support is needed unless otherwise documented below in the visit note. 

## 2014-09-27 NOTE — Telephone Encounter (Signed)
Error/gd °

## 2014-10-02 DIAGNOSIS — M170 Bilateral primary osteoarthritis of knee: Secondary | ICD-10-CM | POA: Diagnosis not present

## 2014-11-14 DIAGNOSIS — M0579 Rheumatoid arthritis with rheumatoid factor of multiple sites without organ or systems involvement: Secondary | ICD-10-CM | POA: Diagnosis not present

## 2014-12-04 ENCOUNTER — Encounter: Payer: Self-pay | Admitting: Internal Medicine

## 2014-12-04 ENCOUNTER — Ambulatory Visit (INDEPENDENT_AMBULATORY_CARE_PROVIDER_SITE_OTHER): Payer: Managed Care, Other (non HMO) | Admitting: Internal Medicine

## 2014-12-04 VITALS — BP 110/64 | HR 68 | Ht 60.0 in | Wt 126.0 lb

## 2014-12-04 DIAGNOSIS — Z0000 Encounter for general adult medical examination without abnormal findings: Secondary | ICD-10-CM

## 2014-12-04 DIAGNOSIS — M069 Rheumatoid arthritis, unspecified: Secondary | ICD-10-CM

## 2014-12-04 DIAGNOSIS — Z1211 Encounter for screening for malignant neoplasm of colon: Secondary | ICD-10-CM

## 2014-12-04 DIAGNOSIS — Z1239 Encounter for other screening for malignant neoplasm of breast: Secondary | ICD-10-CM

## 2014-12-04 MED ORDER — TRAMADOL HCL 50 MG PO TABS: 50.0000 mg | ORAL_TABLET | Freq: Two times a day (BID) | ORAL | Status: DC | PRN

## 2014-12-04 MED ORDER — ZOLPIDEM TARTRATE 10 MG PO TABS: ORAL_TABLET | ORAL | Status: DC

## 2014-12-04 NOTE — Assessment & Plan Note (Signed)
We discussed age appropriate health related issues, including available/recomended screening tests and vaccinations. We discussed a need for adhering to healthy diet and exercise. Labs/EKG were reviewed/ordered. All questions were answered.   

## 2014-12-04 NOTE — Progress Notes (Signed)
   Subjective:     HPI  The patient is here for a wellness exam. She was sick w/URI F/u RA - now on Humira and tapering off prednisone F/u insomnia, COPD - doing ok. Pt is working 1st shift 6d/week.   Review of Systems  Constitutional: Positive for fatigue (better). Negative for fever, activity change and unexpected weight change.  Respiratory: Negative for cough.   Cardiovascular: Negative for chest pain and palpitations.  Gastrointestinal: Negative for nausea, vomiting, blood in stool and abdominal distention.  Genitourinary: Negative for urgency, hematuria and flank pain.  Musculoskeletal: Positive for arthralgias (better). Negative for back pain, joint swelling, gait problem and neck stiffness.  Skin: Negative for pallor and rash.  Neurological: Negative for seizures, syncope and weakness.  Psychiatric/Behavioral: Positive for sleep disturbance (better). Negative for suicidal ideas and decreased concentration. The patient is not nervous/anxious.     Wt Readings from Last 3 Encounters:  12/04/14 126 lb (57.153 kg)  09/19/14 128 lb (58.06 kg)  04/26/14 129 lb 6.4 oz (58.695 kg)   BP Readings from Last 3 Encounters:  12/04/14 110/64  09/19/14 130/70  04/26/14 118/68      Objective:   Physical Exam  Constitutional: She appears well-developed and well-nourished. No distress.  HENT:  Head: Normocephalic.  Right Ear: External ear normal.  Left Ear: External ear normal.  Nose: Nose normal.  Mouth/Throat: Oropharynx is clear and moist.  Eyes: Conjunctivae are normal. Pupils are equal, round, and reactive to light. Right eye exhibits no discharge. Left eye exhibits no discharge.  Neck: Normal range of motion. Neck supple. No JVD present. No tracheal deviation present. No thyromegaly present.  Cardiovascular: Normal rate, regular rhythm and normal heart sounds.   No murmur heard. Pulmonary/Chest: No stridor. No respiratory distress. She has no wheezes. She has no rales.   Abdominal: Soft. Bowel sounds are normal. She exhibits no distension and no mass. There is no tenderness. There is no rebound and no guarding.  Musculoskeletal: She exhibits tenderness (B hands are very tender w/decr ROM). She exhibits no edema.  B wrists, knees are not tender and not swollen   Lymphadenopathy:    She has no cervical adenopathy.  Neurological: She displays normal reflexes. No cranial nerve deficit. She exhibits normal muscle tone. Coordination normal.  Skin: No rash noted. No erythema. No pallor.  Psychiatric: She has a normal mood and affect. Her behavior is normal. Judgment and thought content normal.  Not tearful   Lab Results  Component Value Date   WBC 8.8 09/13/2013   HGB 15.8* 09/13/2013   HCT 47.6* 09/13/2013   PLT 247.0 09/13/2013   GLUCOSE 93 09/13/2013   CHOL 261* 09/13/2013   TRIG 272.0* 09/13/2013   HDL 40.70 09/13/2013   LDLDIRECT 194.4 01/27/2012   LDLCALC 166* 09/13/2013   ALT 22 09/13/2013   AST 23 09/13/2013   NA 136 09/13/2013   K 4.0 09/13/2013   CL 99 09/13/2013   CREATININE 0.7 09/13/2013   BUN 11 09/13/2013   CO2 31 09/13/2013   TSH 2.43 09/13/2013            Assessment & Plan:

## 2014-12-04 NOTE — Progress Notes (Signed)
Pre visit review using our clinic review tool, if applicable. No additional management support is needed unless otherwise documented below in the visit note. 

## 2014-12-05 ENCOUNTER — Other Ambulatory Visit (INDEPENDENT_AMBULATORY_CARE_PROVIDER_SITE_OTHER): Payer: Managed Care, Other (non HMO)

## 2014-12-05 DIAGNOSIS — R7989 Other specified abnormal findings of blood chemistry: Secondary | ICD-10-CM | POA: Diagnosis not present

## 2014-12-05 DIAGNOSIS — E785 Hyperlipidemia, unspecified: Secondary | ICD-10-CM

## 2014-12-05 DIAGNOSIS — M069 Rheumatoid arthritis, unspecified: Secondary | ICD-10-CM | POA: Diagnosis not present

## 2014-12-05 DIAGNOSIS — Z0000 Encounter for general adult medical examination without abnormal findings: Secondary | ICD-10-CM | POA: Diagnosis not present

## 2014-12-05 DIAGNOSIS — Z1211 Encounter for screening for malignant neoplasm of colon: Secondary | ICD-10-CM

## 2014-12-05 DIAGNOSIS — Z1239 Encounter for other screening for malignant neoplasm of breast: Secondary | ICD-10-CM

## 2014-12-05 LAB — CBC WITH DIFFERENTIAL/PLATELET
BASOS ABS: 0 10*3/uL (ref 0.0–0.1)
Basophils Relative: 0.2 % (ref 0.0–3.0)
EOS ABS: 0 10*3/uL (ref 0.0–0.7)
Eosinophils Relative: 0.2 % (ref 0.0–5.0)
HEMATOCRIT: 48.2 % — AB (ref 36.0–46.0)
Hemoglobin: 16.1 g/dL — ABNORMAL HIGH (ref 12.0–15.0)
LYMPHS ABS: 3 10*3/uL (ref 0.7–4.0)
Lymphocytes Relative: 35.2 % (ref 12.0–46.0)
MCHC: 33.3 g/dL (ref 30.0–36.0)
MCV: 89.8 fl (ref 78.0–100.0)
MONO ABS: 0.8 10*3/uL (ref 0.1–1.0)
Monocytes Relative: 9 % (ref 3.0–12.0)
NEUTROS PCT: 55.4 % (ref 43.0–77.0)
Neutro Abs: 4.8 10*3/uL (ref 1.4–7.7)
PLATELETS: 272 10*3/uL (ref 150.0–400.0)
RBC: 5.37 Mil/uL — ABNORMAL HIGH (ref 3.87–5.11)
RDW: 14.1 % (ref 11.5–15.5)
WBC: 8.6 10*3/uL (ref 4.0–10.5)

## 2014-12-05 LAB — TSH: TSH: 1.63 u[IU]/mL (ref 0.35–4.50)

## 2014-12-05 LAB — LIPID PANEL
CHOL/HDL RATIO: 6
Cholesterol: 257 mg/dL — ABNORMAL HIGH (ref 0–200)
HDL: 40.3 mg/dL (ref >39.00)
NonHDL: 216.7
Triglycerides: 219 mg/dL — ABNORMAL HIGH (ref 0.0–149.0)
VLDL: 43.8 mg/dL — ABNORMAL HIGH (ref 0.0–40.0)

## 2014-12-05 LAB — HEPATIC FUNCTION PANEL
ALBUMIN: 3.8 g/dL (ref 3.5–5.2)
ALT: 18 U/L (ref 0–35)
AST: 21 U/L (ref 0–37)
Alkaline Phosphatase: 88 U/L (ref 39–117)
BILIRUBIN DIRECT: 0.1 mg/dL (ref 0.0–0.3)
TOTAL PROTEIN: 7.4 g/dL (ref 6.0–8.3)
Total Bilirubin: 0.3 mg/dL (ref 0.2–1.2)

## 2014-12-05 LAB — BASIC METABOLIC PANEL
BUN: 16 mg/dL (ref 6–23)
CALCIUM: 9.5 mg/dL (ref 8.4–10.5)
CO2: 25 meq/L (ref 19–32)
CREATININE: 0.64 mg/dL (ref 0.40–1.20)
Chloride: 104 mEq/L (ref 96–112)
GFR: 98.2 mL/min (ref >60.00)
GLUCOSE: 97 mg/dL (ref 70–99)
Potassium: 3.7 mEq/L (ref 3.5–5.1)
Sodium: 139 mEq/L (ref 135–145)

## 2014-12-05 LAB — URINALYSIS
Bilirubin Urine: NEGATIVE
Hgb urine dipstick: NEGATIVE
Ketones, ur: NEGATIVE
Leukocytes, UA: NEGATIVE
NITRITE: NEGATIVE
Specific Gravity, Urine: 1.02 (ref 1.000–1.030)
Total Protein, Urine: NEGATIVE
Urine Glucose: NEGATIVE
Urobilinogen, UA: 0.2 (ref 0.0–1.0)
pH: 6.5 (ref 5.0–8.0)

## 2014-12-05 LAB — VITAMIN B12

## 2014-12-05 LAB — VITAMIN D 25 HYDROXY (VIT D DEFICIENCY, FRACTURES): VITD: 44.62 ng/mL (ref 30.00–100.00)

## 2014-12-05 LAB — LDL CHOLESTEROL, DIRECT: LDL DIRECT: 170 mg/dL

## 2014-12-06 ENCOUNTER — Encounter: Payer: Self-pay | Admitting: Internal Medicine

## 2014-12-11 ENCOUNTER — Telehealth: Payer: Self-pay | Admitting: Internal Medicine

## 2014-12-11 NOTE — Telephone Encounter (Signed)
Patient is calling back for the results of her labs

## 2014-12-12 MED ORDER — PRAVASTATIN SODIUM 20 MG PO TABS: 20.0000 mg | ORAL_TABLET | Freq: Every day | ORAL | Status: DC

## 2014-12-12 NOTE — Telephone Encounter (Signed)
Pt informed- she can not take Lipitor as it causes leg cramps. Please advise. Call pt any day after 3 pm.

## 2014-12-12 NOTE — Telephone Encounter (Signed)
Left detailed mess informing pt of below. Need to know if she is ok with starting Lipitor and what pharmacy to send Rx to.   Notes Recorded by Cresenciano Lick, CMA on 12/11/2014 at 10:12 AM Left mess for patient to call back. Notes Recorded by Cresenciano Lick, CMA on 12/07/2014 at 9:05 AM Left mess for patient to call back. Notes Recorded by Cassandria Anger, MD on 12/05/2014 at 8:36 PM Erline Levine, please, inform patient that all labs are normal except for elev Hgb - (needs to stop smoking) and elev lipids (we can start Lipitor). Thx

## 2014-12-12 NOTE — Telephone Encounter (Signed)
Noted Try Pravastatin - emailed Rx

## 2014-12-14 NOTE — Telephone Encounter (Signed)
Left detailed mess informing pt of below.  

## 2015-01-09 ENCOUNTER — Encounter: Payer: Self-pay | Admitting: Gastroenterology

## 2015-01-11 ENCOUNTER — Ambulatory Visit: Payer: Managed Care, Other (non HMO) | Admitting: Internal Medicine

## 2015-01-21 ENCOUNTER — Ambulatory Visit: Payer: Managed Care, Other (non HMO) | Admitting: Internal Medicine

## 2015-01-25 DIAGNOSIS — M1711 Unilateral primary osteoarthritis, right knee: Secondary | ICD-10-CM | POA: Diagnosis not present

## 2015-01-25 DIAGNOSIS — M94261 Chondromalacia, right knee: Secondary | ICD-10-CM | POA: Diagnosis not present

## 2015-01-30 ENCOUNTER — Encounter: Payer: Managed Care, Other (non HMO) | Admitting: Internal Medicine

## 2015-01-31 ENCOUNTER — Other Ambulatory Visit: Payer: Self-pay | Admitting: Internal Medicine

## 2015-01-31 ENCOUNTER — Other Ambulatory Visit (HOSPITAL_COMMUNITY): Payer: Self-pay | Admitting: Orthopedic Surgery

## 2015-01-31 ENCOUNTER — Ambulatory Visit (HOSPITAL_COMMUNITY)
Admission: RE | Admit: 2015-01-31 | Discharge: 2015-01-31 | Disposition: A | Discharge status: Home / Self Care | Payer: Managed Care, Other (non HMO) | Source: Ambulatory Visit | Attending: Cardiology | Admitting: Cardiology

## 2015-01-31 DIAGNOSIS — M79604 Pain in right leg: Secondary | ICD-10-CM

## 2015-01-31 DIAGNOSIS — R2241 Localized swelling, mass and lump, right lower limb: Secondary | ICD-10-CM | POA: Diagnosis not present

## 2015-01-31 DIAGNOSIS — M79661 Pain in right lower leg: Secondary | ICD-10-CM | POA: Diagnosis not present

## 2015-02-01 ENCOUNTER — Ambulatory Visit: Payer: Managed Care, Other (non HMO) | Admitting: Internal Medicine

## 2015-02-04 DIAGNOSIS — M1711 Unilateral primary osteoarthritis, right knee: Secondary | ICD-10-CM | POA: Diagnosis not present

## 2015-02-04 DIAGNOSIS — S86911A Strain of unspecified muscle(s) and tendon(s) at lower leg level, right leg, initial encounter: Secondary | ICD-10-CM | POA: Diagnosis not present

## 2015-02-05 ENCOUNTER — Emergency Department (HOSPITAL_COMMUNITY)
Admission: EM | Admit: 2015-02-05 | Discharge: 2015-02-05 | Disposition: A | Discharge status: Home / Self Care | Payer: Managed Care, Other (non HMO) | Attending: Emergency Medicine | Admitting: Emergency Medicine

## 2015-02-05 ENCOUNTER — Telehealth: Payer: Self-pay | Admitting: Internal Medicine

## 2015-02-05 ENCOUNTER — Encounter (HOSPITAL_COMMUNITY): Payer: Self-pay | Admitting: Emergency Medicine

## 2015-02-05 DIAGNOSIS — Z720 Tobacco use: Secondary | ICD-10-CM | POA: Diagnosis not present

## 2015-02-05 DIAGNOSIS — G510 Bell's palsy: Secondary | ICD-10-CM

## 2015-02-05 DIAGNOSIS — M1990 Unspecified osteoarthritis, unspecified site: Secondary | ICD-10-CM | POA: Insufficient documentation

## 2015-02-05 DIAGNOSIS — Z7951 Long term (current) use of inhaled steroids: Secondary | ICD-10-CM | POA: Insufficient documentation

## 2015-02-05 DIAGNOSIS — Z79899 Other long term (current) drug therapy: Secondary | ICD-10-CM | POA: Insufficient documentation

## 2015-02-05 DIAGNOSIS — G709 Myoneural disorder, unspecified: Secondary | ICD-10-CM

## 2015-02-05 DIAGNOSIS — J449 Chronic obstructive pulmonary disease, unspecified: Secondary | ICD-10-CM | POA: Insufficient documentation

## 2015-02-05 DIAGNOSIS — Z7952 Long term (current) use of systemic steroids: Secondary | ICD-10-CM | POA: Diagnosis not present

## 2015-02-05 DIAGNOSIS — R531 Weakness: Secondary | ICD-10-CM | POA: Diagnosis present

## 2015-02-05 HISTORY — DX: Myoneural disorder, unspecified: G70.9

## 2015-02-05 MED ORDER — PREDNISONE 20 MG PO TABS
60.0000 mg | ORAL_TABLET | Freq: Once | ORAL | Status: AC
Start: 2015-02-05 — End: 2015-02-05
  Administered 2015-02-05: 60 mg via ORAL
  Filled 2015-02-05: qty 3

## 2015-02-05 MED ORDER — PREDNISONE 20 MG PO TABS: 40.0000 mg | ORAL_TABLET | Freq: Every day | ORAL | Status: DC

## 2015-02-05 NOTE — Telephone Encounter (Signed)
Patient Name: Debra Farmer  DOB: [birth year]    Initial Comment Caller states c/o right side facial drooping, eye won't close right   Nurse Assessment  Nurse: Leilani Merl, RN, Heather Date/Time (Eastern Time): 02/05/2015 12:53:46 PM  Confirm and document reason for call. If symptomatic, describe symptoms. ---Caller states c/o right side facial drooping yesterday, eye started dropping 2 days ago  Has the patient traveled out of the country within the last 30 days? ---Not Applicable  Does the patient require triage? ---Yes  Related visit to physician within the last 2 weeks? ---No  Does the PT have any chronic conditions? (i.e. diabetes, asthma, etc.) ---Yes  List chronic conditions. ---COPD, arthritis     Guidelines    Guideline Title Affirmed Question Affirmed Notes  Neurologic Deficit Bell's palsy suspected (i.e., weakness on only one side of the face, developing over hours to days, no other symptoms)    Final Disposition User   Go to ED Now (or PCP triage) Leilani Merl, RN, Heather    Referrals  [facility] - ED  [facility] - [facility]   Disagree/Comply: Comply

## 2015-02-05 NOTE — ED Provider Notes (Signed)
CSN: [id number]     Arrival date & time 02/05/15  1411 History   First MD Initiated Contact with Patient 02/05/15 1457     No chief complaint on file.    (Consider location/radiation/quality/duration/timing/severity/associated sxs/prior Treatment) HPI   69 year old female with right-sided facial weakness. Onset yesterday evening. Initially noticed by her family to point out to her. First thing she noticed was that she was having difficulty closing her right eye. She was wearing contacts and her eye was drying out. This morning noticed a right facial droop. Very mild dysarthria. Denies any appreciable change in taste or hearing. Denies any pain. No acute numbness, tingling or loss strength elsewhere. No history similar symptoms.  Past Medical History  Diagnosis Date  . Arthritis   . COPD (chronic obstructive pulmonary disease)    Past Surgical History  Procedure Laterality Date  . Abdominal hysterectomy      partial  . Cholecystectomy    . Appendectomy    . Knee arthroscopy  1999    Dr Theda Sers -- R knee   Family History  Problem Relation Age of Onset  . Heart disease Mother 67    CHF  . COPD Father 53    emphesema   Social History  Substance Use Topics  . Smoking status: Current Every Day Smoker -- 0.50 packs/day  . Smokeless tobacco: None  . Alcohol Use: No   OB History    No data available     Review of Systems  All systems reviewed and negative, other than as noted in HPI.   Allergies  Aspirin; Codeine sulfate; Lipitor; and Nabumetone  Home Medications   Prior to Admission medications   Medication Sig Start Date End Date Taking? Authorizing Provider  aspirin EC 81 MG EC tablet Take 81 mg by mouth daily.      Historical Provider, MD  Fluticasone Furoate-Vilanterol (BREO ELLIPTA) 100-25 MCG/INH AEPB Inhale 1 Act into the lungs daily. 03/06/14   Aleksei Plotnikov V, MD  folic acid (FOLVITE) 1 MG tablet Take 1 tablet (1 mg total) by mouth daily. Patient not  taking: Reported on 12/04/2014 09/19/12   Aleksei Plotnikov V, MD  HUMIRA PEN 40 MG/0.8ML PNKT every 14 (fourteen) days. 09/18/14   Historical Provider, MD  leflunomide (ARAVA) 10 MG tablet Take 10 mg by mouth daily. 09/10/14   Historical Provider, MD  methocarbamol (ROBAXIN) 500 MG tablet Take 1 tablet (500 mg total) by mouth every 6 (six) hours as needed for muscle spasms. Patient not taking: Reported on 12/04/2014 04/26/14   Golden Circle, FNP  nystatin (MYCOSTATIN) 100000 UNIT/ML suspension Take 5 mLs (500,000 Units total) by mouth 4 (four) times daily. Swish, hold, swallow Patient not taking: Reported on 12/04/2014 03/06/14   Lew Dawes V, MD  pravastatin (PRAVACHOL) 20 MG tablet Take 1 tablet (20 mg total) by mouth daily. 12/12/14   Aleksei Plotnikov V, MD  predniSONE (DELTASONE) 1 MG tablet Take 4 tablets by mouth daily. 11/14/14   Historical Provider, MD  predniSONE (DELTASONE) 20 MG tablet Take 2 tablets (40 mg total) by mouth daily. 02/05/15   Virgel Manifold, MD  ranitidine (ZANTAC) 150 MG tablet Take 1 tablet (150 mg total) by mouth 2 (two) times daily. Patient not taking: Reported on 12/04/2014 09/13/13   Tyrone Apple Plotnikov V, MD  traMADol (ULTRAM) 50 MG tablet Take 1-2 tablets (50-100 mg total) by mouth 2 (two) times daily as needed. 12/04/14   Aleksei Plotnikov V, MD  valACYclovir (VALTREX) 1000 MG tablet  Take 1 tablet (1,000 mg total) by mouth 3 (three) times daily. Patient not taking: Reported on 12/04/2014 04/05/14   Golden Circle, FNP  zolpidem (AMBIEN) 10 MG tablet TAKE 1 TABLET BY MOUTH AT BEDTIME AS NEEDED FOR SLEEP 12/04/14   Aleksei Plotnikov V, MD   BP 139/72 mmHg  Pulse 96  Temp(Src) 97.8 F (36.6 C) (Oral)  Resp 18  SpO2 98% Physical Exam  Constitutional: She appears well-developed and well-nourished. No distress.  HENT:  Head: Normocephalic and atraumatic.  Eyes: Conjunctivae are normal. Right eye exhibits no discharge. Left eye exhibits no discharge.  Neck: Neck  supple.  Cardiovascular: Normal rate, regular rhythm and normal heart sounds.  Exam reveals no gallop and no friction rub.   No murmur heard. Pulmonary/Chest: Effort normal and breath sounds normal. No respiratory distress.  Abdominal: Soft. She exhibits no distension. There is no tenderness.  Musculoskeletal: She exhibits no edema or tenderness.  Some swelling to the right knee and decreased range of motion secondary to pain. Patient reports that this is more chronic in nature & preceded her other symptoms.  Neurological: She is alert.  Right facial nerve palsy. Right-sided facial droop. Inability to completely close the right eye. Forehead is involved. Cranial nerves are otherwise intact. Strength is 5 out of 5 bilateral upper and lower extremities. Sensation is intact to light touch. Good finger to nose testing bilaterally.  Skin: Skin is warm and dry.  Psychiatric: She has a normal mood and affect. Her behavior is normal. Thought content normal.  Nursing note and vitals reviewed.   ED Course  Procedures (including critical care time) Labs Review Labs Reviewed - No data to display  Imaging Review No results found. I have personally reviewed and evaluated these images and lab results as part of my medical decision-making.   EKG Interpretation None      MDM   Final diagnoses:  Bell's palsy    69 year old female with right-sided facial weakness. Exam is consistent with right seventh cranial nerve palsy. Forehead is NOT spared. Exam is otherwise nonfocal. Patient takes low-dose steroids for rheumatoid arthritis. Will place on higher dose for about a week. Advised to try to avoid wearing her contacts until symptoms improve or if she feels like she really has to wear and that she needs to be very careful of eye hydration and needs to keep eye drops handy.     Virgel Manifold, MD 02/05/15 [phone]

## 2015-02-05 NOTE — Discharge Instructions (Signed)
Bell's Palsy °Bell's palsy is a condition in which the muscles on one side of the face cannot move (paralysis). This is because the nerves in the face are paralyzed. It is most often thought to be caused by a virus. The virus causes swelling of the nerve that controls movement on one side of the face. The nerve travels through a tight space surrounded by bone. When the nerve swells, it can be compressed by the bone. This results in damage to the protective covering around the nerve. This damage interferes with how the nerve communicates with the muscles of the face. As a result, it can cause weakness or paralysis of the facial muscles.  °Injury (trauma), tumor, and surgery may cause Bell's palsy, but most of the time the cause is unknown. It is a relatively common condition. It starts suddenly (abrupt onset) with the paralysis usually ending within 2 days. Bell's palsy is not dangerous. But because the eye does not close properly, you may need care to keep the eye from getting dry. This can include splinting (to keep the eye shut) or moistening with artificial tears. Bell's palsy very seldom occurs on both sides of the face at the same time. °SYMPTOMS  °· Eyebrow sagging. °· Drooping of the eyelid and corner of the mouth. °· Inability to close one eye. °· Loss of taste on the front of the tongue. °· Sensitivity to loud noises. °TREATMENT  °The treatment is usually non-surgical. If the patient is seen within the first 24 to 48 hours, a short course of steroids may be prescribed, in an attempt to shorten the length of the condition. Antiviral medicines may also be used with the steroids, but it is unclear if they are helpful.  °You will need to protect your eye, if you cannot close it. The cornea (clear covering over your eye) will become dry and can be damaged. Artificial tears can be used to keep your eye moist. Glasses or an eye patch should be worn to protect your eye. °PROGNOSIS  °Recovery is variable, ranging  from days to months. Although the problem usually goes away completely (about 80% of cases resolve), predicting the outcome is impossible. Most people improve within 3 weeks of when the symptoms began. Improvement may continue for 3 to 6 months. A small number of people have moderate to severe weakness that is permanent.  °HOME CARE INSTRUCTIONS  °· If your caregiver prescribed medication to reduce swelling in the nerve, use as directed. Do not stop taking the medication unless directed by your caregiver. °· Use moisturizing eye drops as needed to prevent drying of your eye, as directed by your caregiver. °· Protect your eye, as directed by your caregiver. °· Use facial massage and exercises, as directed by your caregiver. °· Perform your normal activities, and get your normal rest. °SEEK IMMEDIATE MEDICAL CARE IF:  °· There is pain, redness or irritation in the eye. °· You or your child has an oral temperature above 102° F (38.9° C), not controlled by medicine. °MAKE SURE YOU:  °· Understand these instructions. °· Will watch your condition. °· Will get help right away if you are not doing well or get worse. °Document Released: 06/01/2005 Document Revised: 08/24/2011 Document Reviewed: 09/08/2013 °[employer]® Patient Information ©2015 [employer]. This information is not intended to replace advice given to you by your health care provider. Make sure you discuss any questions you have with your health care provider. ° °

## 2015-02-05 NOTE — ED Notes (Signed)
Pt reports unable to close right  Eye started last night, woke up today with slurred speech and facial drooping. Pt denies weakness to extremities nor Hx HTN, no headache. Pt is alert and oriented x 4. Family Hx of Bell's Palsy.

## 2015-02-11 ENCOUNTER — Ambulatory Visit (INDEPENDENT_AMBULATORY_CARE_PROVIDER_SITE_OTHER): Payer: Managed Care, Other (non HMO) | Admitting: Internal Medicine

## 2015-02-11 ENCOUNTER — Encounter: Payer: Self-pay | Admitting: Emergency Medicine

## 2015-02-11 ENCOUNTER — Encounter: Payer: Self-pay | Admitting: Internal Medicine

## 2015-02-11 VITALS — BP 128/66 | HR 78 | Temp 98.2°F | Resp 16 | Wt 135.0 lb

## 2015-02-11 DIAGNOSIS — R609 Edema, unspecified: Secondary | ICD-10-CM

## 2015-02-11 DIAGNOSIS — J449 Chronic obstructive pulmonary disease, unspecified: Secondary | ICD-10-CM

## 2015-02-11 DIAGNOSIS — G510 Bell's palsy: Secondary | ICD-10-CM | POA: Diagnosis not present

## 2015-02-11 MED ORDER — PREDNISONE 10 MG PO TABS: ORAL_TABLET | ORAL | Status: DC

## 2015-02-11 MED ORDER — FAMCICLOVIR 500 MG PO TABS
500.0000 mg | ORAL_TABLET | Freq: Three times a day (TID) | ORAL | Status: DC
Start: 2015-02-11 — End: 2015-08-01

## 2015-02-11 MED ORDER — FUROSEMIDE 20 MG PO TABS: ORAL_TABLET | ORAL | Status: DC

## 2015-02-11 NOTE — Patient Instructions (Addendum)
Prednisone 10 mg daily as follows: 10 mg 3 times a day for 3 days 10 mg twice a day for 3 days 10 mg daily for 3 days 10 mg one half pill for 3 days  Please protect the eye at night with an eye patch and paper tape. Use Natural Tears to keep eye moist during day. Brush your teeth 3 times a day after meals. Perform the manual exercises for the right facial muscles as many times a day as possible.  Please consider using the E cigarette as we discussed. This could possibly decrease inflammation of the airways

## 2015-02-11 NOTE — Progress Notes (Signed)
   Subjective:    Patient ID: Debra Farmer, female    DOB: 27-Feb-1946, 69 y.o.   MRN: [id number]  HPI She was seen in the emergency room 02/05/15 for Bell's palsy. Symptoms began 02/04/15 as decreased blinking of the right eye. She was placed on prednisone 20 mg 2 twice a day for 6 days. She's had associated bloating with the high-dose prednisone.  She denies any associated upper respiratory tract infection symptoms or other neurologic symptoms.  She has been on 4 mg of prednisone from her Rheumatologist for RA.  She is an active smoker.   Review of Systems Frontal headache, facial pain , nasal purulence, dental pain, sore throat , otic pain or otic discharge denied. No fever , chills or sweats. No blurred vision, double vision, or loss of vision No headache, vertigo, limb weakness, tremor, gait disturbance, seizures, memory loss, numbness or tingling      Objective:   Physical Exam Pertinent or positive findings include: She has classic stigmata right-sided Bell's palsy. She is unable to keep right eye closed against resistance. There is decreased right nasolabial fold with an asymmetric smile. She has complete dentures. She has scattered musical rhonchi diffusely.1/2+ pitting edema @ ankles.  General appearance :adequately nourished; in no distress.  Eyes: No conjunctival inflammation or scleral icterus is present.  Oral exam:  Lips and gums are healthy appearing.There is no oropharyngeal erythema or exudate noted. Dental hygiene is good.  Heart:  Normal rate and regular rhythm. S1 and S2 normal without gallop, murmur,click, rub or other extra sounds    Lungs:No increased work of breathing.   Abdomen: bowel sounds normal, soft and non-tender without masses, organomegaly or hernias noted.  No guarding or rebound.   Vascular : all pulses equal ; no bruits present.  Skin:Warm & dry.  Intact without suspicious lesions or rashes ; no tenting or jaundice   Lymphatic: No  lymphadenopathy is noted about the head, neck, axilla.   Neuro: Strength, tone & DTRs normal.         Assessment & Plan:  #1 Bell's palsy  #2 edema secondary to steroids  #3 asthmatic bronchitis  #4 active smoker; risks & options discusssed  Plan: See orders recommendations

## 2015-02-11 NOTE — Progress Notes (Signed)
Pre visit review using our clinic review tool, if applicable. No additional management support is needed unless otherwise documented below in the visit note. 

## 2015-02-26 ENCOUNTER — Encounter: Payer: Self-pay | Admitting: Internal Medicine

## 2015-02-26 ENCOUNTER — Ambulatory Visit (INDEPENDENT_AMBULATORY_CARE_PROVIDER_SITE_OTHER): Payer: Managed Care, Other (non HMO) | Admitting: Internal Medicine

## 2015-02-26 VITALS — BP 136/80 | HR 78 | Wt 134.0 lb

## 2015-02-26 DIAGNOSIS — G510 Bell's palsy: Secondary | ICD-10-CM | POA: Diagnosis not present

## 2015-02-26 DIAGNOSIS — M1990 Unspecified osteoarthritis, unspecified site: Secondary | ICD-10-CM | POA: Diagnosis not present

## 2015-02-26 DIAGNOSIS — M069 Rheumatoid arthritis, unspecified: Secondary | ICD-10-CM | POA: Diagnosis not present

## 2015-02-26 DIAGNOSIS — M130 Polyarthritis, unspecified: Secondary | ICD-10-CM

## 2015-02-26 DIAGNOSIS — I00 Rheumatic fever without heart involvement: Secondary | ICD-10-CM | POA: Diagnosis not present

## 2015-02-26 DIAGNOSIS — M0520 Rheumatoid vasculitis with rheumatoid arthritis of unspecified site: Secondary | ICD-10-CM

## 2015-02-26 DIAGNOSIS — J449 Chronic obstructive pulmonary disease, unspecified: Secondary | ICD-10-CM

## 2015-02-26 MED ORDER — TRAMADOL HCL 50 MG PO TABS: 50.0000 mg | ORAL_TABLET | Freq: Two times a day (BID) | ORAL | Status: DC | PRN

## 2015-02-26 MED ORDER — FLUTICASONE FUROATE-VILANTEROL 100-25 MCG/INH IN AEPB: 1.0000 | INHALATION_SPRAY | Freq: Every day | RESPIRATORY_TRACT | Status: DC

## 2015-02-26 MED ORDER — VITAMIN D 1000 UNITS PO TABS: 1000.0000 [IU] | ORAL_TABLET | Freq: Every day | ORAL | Status: AC

## 2015-02-26 NOTE — Progress Notes (Signed)
Pre visit review using our clinic review tool, if applicable. No additional management support is needed unless otherwise documented below in the visit note. 

## 2015-03-02 DIAGNOSIS — G510 Bell's palsy: Secondary | ICD-10-CM | POA: Insufficient documentation

## 2015-03-02 DIAGNOSIS — M0600 Rheumatoid arthritis without rheumatoid factor, unspecified site: Secondary | ICD-10-CM | POA: Insufficient documentation

## 2015-03-02 NOTE — Assessment & Plan Note (Signed)
Breo ellipta

## 2015-03-02 NOTE — Assessment & Plan Note (Signed)
Treated. Chart reviewed

## 2015-03-02 NOTE — Assessment & Plan Note (Signed)
Dr Trudie Reed On Humira+ Prednisone

## 2015-03-06 ENCOUNTER — Telehealth: Payer: Self-pay

## 2015-03-06 NOTE — Telephone Encounter (Signed)
fup regarding AWV; Pt having acute issues and will attempt outreach in 6 weeks ;

## 2015-03-09 ENCOUNTER — Other Ambulatory Visit: Payer: Self-pay | Admitting: Internal Medicine

## 2015-03-12 ENCOUNTER — Other Ambulatory Visit: Payer: Self-pay | Admitting: Specialist

## 2015-03-25 ENCOUNTER — Encounter: Payer: Managed Care, Other (non HMO) | Admitting: Gastroenterology

## 2015-04-12 ENCOUNTER — Encounter: Payer: Self-pay | Admitting: Internal Medicine

## 2015-04-12 ENCOUNTER — Ambulatory Visit (INDEPENDENT_AMBULATORY_CARE_PROVIDER_SITE_OTHER): Payer: Managed Care, Other (non HMO) | Admitting: Internal Medicine

## 2015-04-12 VITALS — BP 130/78 | HR 84 | Wt 134.0 lb

## 2015-04-12 DIAGNOSIS — G4709 Other insomnia: Secondary | ICD-10-CM

## 2015-04-12 DIAGNOSIS — Z23 Encounter for immunization: Secondary | ICD-10-CM

## 2015-04-12 DIAGNOSIS — J449 Chronic obstructive pulmonary disease, unspecified: Secondary | ICD-10-CM | POA: Diagnosis not present

## 2015-04-12 DIAGNOSIS — M0600 Rheumatoid arthritis without rheumatoid factor, unspecified site: Secondary | ICD-10-CM | POA: Diagnosis not present

## 2015-04-12 MED ORDER — TRAMADOL HCL 50 MG PO TABS: 50.0000 mg | ORAL_TABLET | Freq: Two times a day (BID) | ORAL | Status: DC | PRN

## 2015-04-12 MED ORDER — ZOLPIDEM TARTRATE 10 MG PO TABS: ORAL_TABLET | ORAL | Status: DC

## 2015-04-12 NOTE — Assessment & Plan Note (Signed)
In a refractory smoker Breo Ellipta

## 2015-04-12 NOTE — Assessment & Plan Note (Signed)
On Humira+ Arava +Prednisone Tramadol prn  Potential benefits of a long term opioids use as well as potential risks (i.e. addiction risk, apnea etc) and complications (i.e. Somnolence, constipation and others) were explained to the patient and were aknowledged. 

## 2015-04-12 NOTE — Assessment & Plan Note (Signed)
Zolpidem prn 

## 2015-04-12 NOTE — Progress Notes (Signed)
Subjective:  Patient ID: Debra Farmer, female    DOB: Jul 15, 1945  Age: 69 y.o. MRN: [id number]  CC: No chief complaint on file.   HPI Debra Farmer presents for Bell's palsy, COPD, RA, anxiety f/u. R knee was operated on  Outpatient Prescriptions Prior to Visit  Medication Sig Dispense Refill  . cholecalciferol (VITAMIN D) 1000 UNITS tablet Take 1 tablet (1,000 Units total) by mouth daily. 100 tablet 3  . famciclovir (FAMVIR) 500 MG tablet Take 1 tablet (500 mg total) by mouth 3 (three) times daily. 21 tablet 0  . Fluticasone Furoate-Vilanterol (BREO ELLIPTA) 100-25 MCG/INH AEPB Inhale 1 Act into the lungs daily. 1 each 5  . furosemide (LASIX) 20 MG tablet TAKE 1 TABLET BY MOUTH AS NEEDED FOR FLUID 30 tablet 1  . gabapentin (NEURONTIN) 300 MG capsule Take 1 capsule by mouth at bedtime.    [name] HUMIRA PEN 40 MG/0.8ML PNKT every 14 (fourteen) days.    [name] leflunomide (ARAVA) 10 MG tablet Take 10 mg by mouth daily.  0  . predniSONE (DELTASONE) 1 MG tablet TAKE 1-4 TABS AS DIRECTED DAILY  3  . traMADol (ULTRAM) 50 MG tablet Take 1-2 tablets (50-100 mg total) by mouth 2 (two) times daily as needed. 120 tablet 3  . zolpidem (AMBIEN) 10 MG tablet TAKE 1 TABLET BY MOUTH AT BEDTIME AS NEEDED FOR SLEEP 30 tablet 5   No facility-administered medications prior to visit.    ROS Review of Systems  Constitutional: Negative for chills, activity change, appetite change, fatigue and unexpected weight change.  HENT: Negative for congestion, mouth sores and sinus pressure.   Eyes: Positive for pain. Negative for visual disturbance.  Respiratory: Positive for cough. Negative for chest tightness.   Cardiovascular: Positive for leg swelling.  Gastrointestinal: Negative for nausea and abdominal pain.  Genitourinary: Negative for urgency, frequency, difficulty urinating and vaginal pain.  Musculoskeletal: Positive for back pain and arthralgias. Negative for gait problem.  Skin: Negative for pallor  and rash.  Neurological: Negative for dizziness, tremors, weakness, numbness and headaches.  Psychiatric/Behavioral: Negative for confusion and sleep disturbance. The patient is nervous/anxious.     Objective:  BP 130/78 mmHg  Pulse 84  Wt 134 lb (60.782 kg)  SpO2 97%  BP Readings from Last 3 Encounters:  04/12/15 130/78  02/26/15 136/80  02/11/15 128/66    Wt Readings from Last 3 Encounters:  04/12/15 134 lb (60.782 kg)  02/26/15 134 lb (60.782 kg)  02/11/15 135 lb (61.236 kg)    Physical Exam  Constitutional: She appears well-developed. No distress.  HENT:  Head: Normocephalic.  Right Ear: External ear normal.  Left Ear: External ear normal.  Nose: Nose normal.  Mouth/Throat: Oropharynx is clear and moist.  Eyes: Conjunctivae are normal. Pupils are equal, round, and reactive to light. Right eye exhibits no discharge. Left eye exhibits no discharge.  Neck: Normal range of motion. Neck supple. No JVD present. No tracheal deviation present. No thyromegaly present.  Cardiovascular: Normal rate, regular rhythm and normal heart sounds.   Pulmonary/Chest: No stridor. No respiratory distress. She has no wheezes.  Abdominal: Soft. Bowel sounds are normal. She exhibits no distension and no mass. There is tenderness. There is no rebound and no guarding.  Musculoskeletal: She exhibits no edema or tenderness.  Lymphadenopathy:    She has no cervical adenopathy.  Neurological: She displays normal reflexes. No cranial nerve deficit. She exhibits normal muscle tone. Coordination normal.  Skin: No rash noted. No erythema.  Psychiatric: She has a normal mood and affect. Her behavior is normal. Judgment and thought content normal.  ankles, knees tender  Lab Results  Component Value Date   WBC 8.6 12/05/2014   HGB 16.1* 12/05/2014   HCT 48.2* 12/05/2014   PLT 272.0 12/05/2014   GLUCOSE 97 12/05/2014   CHOL 257* 12/05/2014   TRIG 219.0* 12/05/2014   HDL 40.30 12/05/2014   LDLDIRECT  170.0 12/05/2014   LDLCALC 166* 09/13/2013   ALT 18 12/05/2014   AST 21 12/05/2014   NA 139 12/05/2014   K 3.7 12/05/2014   CL 104 12/05/2014   CREATININE 0.64 12/05/2014   BUN 16 12/05/2014   CO2 25 12/05/2014   TSH 1.63 12/05/2014    No results found.  Assessment & Plan:   There are no diagnoses linked to this encounter. I am having Debra Farmer maintain her HUMIRA PEN, leflunomide, zolpidem, famciclovir, gabapentin, predniSONE, traMADol, cholecalciferol, Fluticasone Furoate-Vilanterol, and furosemide.  No orders of the defined types were placed in this encounter.     Follow-up: No Follow-up on file.  Walker Kehr, MD

## 2015-04-12 NOTE — Progress Notes (Signed)
Pre visit review using our clinic review tool, if applicable. No additional management support is needed unless otherwise documented below in the visit note. 

## 2015-04-16 ENCOUNTER — Telehealth: Payer: Self-pay

## 2015-04-16 NOTE — Telephone Encounter (Signed)
Call no 2 to schedule AWV; missed fup last week with MD. Would like to schedule by the end of the year. Awaiting call back.

## 2015-04-16 NOTE — Telephone Encounter (Signed)
To call to fup on AWV; Stated that her bell's palsy was improving; Had a bakers Cyst burst and this caused her Bell's palsy.  Feeling better; Dr. Camila Li has asked she come back in Dec; will schedule apt and will schedule AWV when she comes back in Dec.  Regarding screens; Postponed colonoscopy due to Dr. Olevia Perches retiring; will have mammogram but has not had a chance yet; Dexa scan to be done and hep c

## 2015-07-01 DIAGNOSIS — Z4789 Encounter for other orthopedic aftercare: Secondary | ICD-10-CM | POA: Diagnosis not present

## 2015-07-01 DIAGNOSIS — M1711 Unilateral primary osteoarthritis, right knee: Secondary | ICD-10-CM | POA: Diagnosis not present

## 2015-07-08 DIAGNOSIS — M0609 Rheumatoid arthritis without rheumatoid factor, multiple sites: Secondary | ICD-10-CM | POA: Diagnosis not present

## 2015-07-08 DIAGNOSIS — R768 Other specified abnormal immunological findings in serum: Secondary | ICD-10-CM | POA: Diagnosis not present

## 2015-07-08 DIAGNOSIS — G510 Bell's palsy: Secondary | ICD-10-CM | POA: Diagnosis not present

## 2015-07-08 DIAGNOSIS — Z79899 Other long term (current) drug therapy: Secondary | ICD-10-CM | POA: Diagnosis not present

## 2015-07-08 DIAGNOSIS — M150 Primary generalized (osteo)arthritis: Secondary | ICD-10-CM | POA: Diagnosis not present

## 2015-07-08 DIAGNOSIS — M2550 Pain in unspecified joint: Secondary | ICD-10-CM | POA: Diagnosis not present

## 2015-07-08 DIAGNOSIS — R5383 Other fatigue: Secondary | ICD-10-CM | POA: Diagnosis not present

## 2015-08-01 ENCOUNTER — Encounter: Payer: Self-pay | Admitting: Internal Medicine

## 2015-08-01 ENCOUNTER — Ambulatory Visit (INDEPENDENT_AMBULATORY_CARE_PROVIDER_SITE_OTHER): Payer: Managed Care, Other (non HMO) | Admitting: Internal Medicine

## 2015-08-01 VITALS — BP 128/64 | HR 76 | Temp 98.3°F | Ht 60.0 in | Wt 124.5 lb

## 2015-08-01 DIAGNOSIS — M5412 Radiculopathy, cervical region: Secondary | ICD-10-CM

## 2015-08-01 MED ORDER — GABAPENTIN 100 MG PO CAPS: ORAL_CAPSULE | ORAL | Status: DC

## 2015-08-01 NOTE — Progress Notes (Signed)
Pre visit review using our clinic review tool, if applicable. No additional management support is needed unless otherwise documented below in the visit note. 

## 2015-08-01 NOTE — Progress Notes (Signed)
   Subjective:    Patient ID: Debra Farmer, female    DOB: [birth year], 70 y.o.   MRN: [id number]  HPI   Her symptoms began yesterday after she stood up from the kitchen table after eating & acutely experienced pain in the left inguinal area. She stated that pain immediately ran up the back to the left scapular area and left neck. The pain in the inguial area resolved but she's continued to have sharp constant pain in the trapezius area up to a level "15"! Ice and a muscle relaxant did not help.  Significantly she is on prednisone 1 mg as well as Humira for rheumatoid arthritis.   Review of Systems  She denies numbness, tingling, weakness in the upper extremities. There's been no associated fever, chills, sweats or weight loss. There's been no loss of control of  bladder bowels. No associated rash present.    Objective:   Physical Exam Pertinent or positive findings include: Neurologic exam : Cn 2-7 intact Strength equal & normal in upper & lower extremities Able to walk on heels and toes.   Balance normal  Deep tendon reflexes are normal.  She has complete dentures. There is marked tenderness in the  musculature of the L scapular area. She has marked decreased range of motion of the cervical spine with pain. 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  Eyes: No conjunctival inflammation or scleral icterus is present.  Oral exam:  Lips and gums are healthy appearing.There is no oropharyngeal erythema or exudate noted.   Heart:  Normal rate and regular rhythm. S1 and S2 normal without gallop, murmur, click, rub or other extra sounds    Lungs:Chest clear to auscultation; no wheezes, rhonchi,rales ,or rubs present.No increased work of breathing.   Abdomen: bowel sounds normal, soft and non-tender without masses, organomegaly or hernias noted.  No guarding or rebound. No flank tenderness to percussion.  Vascular : all  pulses equal ; no bruits present.  Skin:Warm & dry.  Intact without suspicious lesions or rashes ; no tenting or jaundice   Lymphatic: No lymphadenopathy is noted about the head, neck, axilla     Assessment & Plan:  #1 cervical neuralgia with secondary muscle spasm  Plan: See orders and recommendations

## 2015-08-01 NOTE — Patient Instructions (Signed)
Use an anti-inflammatory cream such as Aspercreme or Zostrix cream twice a day to the affected area as needed. In lieu of this warm moist compresses or  hot water bottle can be used. Do not apply ice .  Assess response to the gabapentin one every 8 hours as needed. If it is partially beneficial, it can be increased up to a total of 3 pills every 8 hours as needed. This increase of 1 pill each dose  should take place over 72 hours at least.If 300 mg is effective dose ; there is a 300 mg pill.

## 2015-09-16 ENCOUNTER — Other Ambulatory Visit: Payer: Self-pay | Admitting: Internal Medicine

## 2015-09-17 ENCOUNTER — Encounter: Payer: Self-pay | Admitting: Family

## 2015-09-17 ENCOUNTER — Ambulatory Visit (INDEPENDENT_AMBULATORY_CARE_PROVIDER_SITE_OTHER): Payer: Managed Care, Other (non HMO) | Admitting: Family

## 2015-09-17 ENCOUNTER — Ambulatory Visit (INDEPENDENT_AMBULATORY_CARE_PROVIDER_SITE_OTHER)
Admission: RE | Admit: 2015-09-17 | Discharge: 2015-09-17 | Disposition: A | Discharge status: Home / Self Care | Payer: Managed Care, Other (non HMO) | Source: Ambulatory Visit | Attending: Family | Admitting: Family

## 2015-09-17 VITALS — BP 132/68 | HR 80 | Temp 99.1°F | Resp 20 | Ht 60.0 in | Wt 120.0 lb

## 2015-09-17 DIAGNOSIS — R509 Fever, unspecified: Secondary | ICD-10-CM | POA: Diagnosis not present

## 2015-09-17 DIAGNOSIS — R05 Cough: Secondary | ICD-10-CM

## 2015-09-17 DIAGNOSIS — R059 Cough, unspecified: Secondary | ICD-10-CM

## 2015-09-17 LAB — POCT INFLUENZA A/B
INFLUENZA A, POC: NEGATIVE
INFLUENZA B, POC: NEGATIVE

## 2015-09-17 MED ORDER — LEVOFLOXACIN 500 MG PO TABS: 500.0000 mg | ORAL_TABLET | Freq: Every day | ORAL | Status: DC

## 2015-09-17 MED ORDER — TRAMADOL HCL 50 MG PO TABS: 50.0000 mg | ORAL_TABLET | Freq: Two times a day (BID) | ORAL | Status: DC | PRN

## 2015-09-17 MED ORDER — ALBUTEROL SULFATE (2.5 MG/3ML) 0.083% IN NEBU
2.5000 mg | INHALATION_SOLUTION | Freq: Once | RESPIRATORY_TRACT | Status: AC
  Administered 2015-09-17: 2.5 mg via RESPIRATORY_TRACT

## 2015-09-17 MED ORDER — PREDNISONE 20 MG PO TABS: 20.0000 mg | ORAL_TABLET | Freq: Two times a day (BID) | ORAL | Status: DC

## 2015-09-17 NOTE — Progress Notes (Signed)
Pre visit review using our clinic review tool, if applicable. No additional management support is needed unless otherwise documented below in the visit note. 

## 2015-09-17 NOTE — Patient Instructions (Signed)
Thank you for choosing [facility].  Summary/Instructions:  Your prescription(s) have been submitted to your pharmacy or been printed and provided for you. Please take as directed and contact our office if you believe you are having problem(s) with the medication(s) or have any questions.  Please stop by radiology on the basement level of the building for your x-rays. Your results will be released to MyChart (or called to you) after review, usually within 72 hours after test completion. If any treatments or changes are necessary, you will be notified at that same time.  If your symptoms worsen or fail to improve, please contact our office for further instruction, or in case of emergency go directly to the emergency room at the closest medical facility.     

## 2015-09-17 NOTE — Progress Notes (Deleted)
Subjective:     Patient ID: Debra Farmer, female   DOB: 11-15-1945, 70 y.o.   MRN: [id number]  HPI: Onset 5 days ago with cough, nausea and diarrhea, stomach cramping with reflux, sinus drainage and pressure, ear pressure and chills especially in evenings. Tmax at home 101.9 last night. Some chest tightness but denies SOB. Currently COPD maintained with oral steroids. No recent antibiotic use. Course of illness is slightly better today, however losing weight without trying. Experiencing loss of appetite.    Review of Systems  Constitutional: Positive for fever, chills, appetite change, fatigue and unexpected weight change.  HENT: Positive for congestion, postnasal drip, rhinorrhea and sinus pressure. Negative for sore throat.   Respiratory: Positive for cough and wheezing. Negative for chest tightness and shortness of breath.   Cardiovascular: Negative for chest pain.  Gastrointestinal: Positive for nausea, abdominal pain and diarrhea. Negative for blood in stool and abdominal distention.       Objective:   Physical Exam  HENT:  Right Ear: External ear normal.  Left Ear: External ear normal.       Assessment:     ***    Plan:     ***

## 2015-09-17 NOTE — Assessment & Plan Note (Signed)
Symptoms and exam consistent with COPD exacerbation with concern for underlying pneumonia. In office influenza test negative. In office breathing treatment provided with oxygen saturation improvement from 88-91%. Obtain chest x-ray. Start levofloxacin and prednisone. Continue over-the-counter medications and previously prescribed medications as indicated. Follow-up if symptoms worsen or do not improve.

## 2015-09-17 NOTE — Progress Notes (Signed)
Subjective:    Patient ID: Debra Farmer, female    DOB: December 12, 1945, 70 y.o.   MRN: [id number]  Chief Complaint  Patient presents with  . Stomach issues    since friday has been sick, had diarrhea for 3 days, fatigue, nausea, no appetite, cough, congestion, needs refill of tramadol    HPI:  Debra Farmer is a 70 y.o. female who  has a past medical history of Arthritis and COPD (chronic obstructive pulmonary disease) ([place]). and presents today For an acute office visit.  This is a new problem. Associated symptom of fatigue, nausea, decreased appetite, cough, congestion and malaise has been going on for approximately 5 days. Productive cough with purulent sputum. Modifying factors include her normally prescribed medications. Last fever was last night with a temperature maximum of 101.9. Describes chest tightness but no shortness of breath. No recent antibiotic use. Course of symptoms is slightly improved today.  . Allergies  Allergen Reactions  . Aspirin     REACTION: upset stomach  . Codeine Sulfate   . Lipitor [Atorvastatin]     cramps  . Nabumetone      Current Outpatient Prescriptions on File Prior to Visit  Medication Sig Dispense Refill  . cholecalciferol (VITAMIN D) 1000 UNITS tablet Take 1 tablet (1,000 Units total) by mouth daily. 100 tablet 3  . Fluticasone Furoate-Vilanterol (BREO ELLIPTA) 100-25 MCG/INH AEPB Inhale 1 Act into the lungs daily. 1 each 5  . furosemide (LASIX) 20 MG tablet TAKE 1 TABLET BY MOUTH AS NEEDED FOR FLUID 30 tablet 1  . gabapentin (NEURONTIN) 100 MG capsule One pill every eight hours as needed; dose may be increased by one pill each dose after 72 hours if only partially effective 30 capsule 2  . HUMIRA PEN 40 MG/0.8ML PNKT every 14 (fourteen) days.    [name] leflunomide (ARAVA) 10 MG tablet Take 10 mg by mouth daily.  0  . predniSONE (DELTASONE) 1 MG tablet TAKE 1-4 TABS AS DIRECTED DAILY  3  . zolpidem (AMBIEN) 10 MG tablet TAKE 1 TABLET BY  MOUTH AT BEDTIME AS NEEDED FOR SLEEP 30 tablet 5   No current facility-administered medications on file prior to visit.    Past Medical History  Diagnosis Date  . Arthritis   . COPD (chronic obstructive pulmonary disease) [facility])      Past Surgical History  Procedure Laterality Date  . Abdominal hysterectomy      partial  . Cholecystectomy    . Appendectomy    . Knee arthroscopy  1999    Dr Theda Sers -- R knee     Review of Systems  Constitutional: Positive for fever. Negative for chills.  HENT: Positive for congestion. Negative for sinus pressure.   Respiratory: Positive for cough and chest tightness. Negative for shortness of breath.   Neurological: Negative for headaches.      Objective:    BP 132/68 mmHg  Pulse 80  Temp(Src) 99.1 F (37.3 C) (Oral)  Resp 20  Ht 5' (1.524 m)  Wt 120 lb (54.432 kg)  BMI 23.44 kg/m2  SpO2 88% Nursing note and vital signs reviewed.  Physical Exam  Constitutional: She is oriented to person, place, and time. She appears well-developed and well-nourished. No distress.  HENT:  Right Ear: Hearing, tympanic membrane, external ear and ear canal normal.  Left Ear: Hearing, tympanic membrane, external ear and ear canal normal.  Nose: Nose normal.  Mouth/Throat: Uvula is midline, oropharynx is clear and moist and mucous  membranes are normal.  Cardiovascular: Normal rate, regular rhythm, normal heart sounds and intact distal pulses.   Pulmonary/Chest: Effort normal. She has wheezes. She has rales.  Neurological: She is alert and oriented to person, place, and time.  Skin: Skin is warm and dry.  Psychiatric: She has a normal mood and affect. Her behavior is normal. Judgment and thought content normal.       Assessment & Plan:   Problem List Items Addressed This Visit      Other   Cough - Primary    Symptoms and exam consistent with COPD exacerbation with concern for underlying pneumonia. In office influenza test negative. In office  breathing treatment provided with oxygen saturation improvement from 88-91%. Obtain chest x-ray. Start levofloxacin and prednisone. Continue over-the-counter medications and previously prescribed medications as indicated. Follow-up if symptoms worsen or do not improve.      Relevant Medications   albuterol (PROVENTIL) (2.5 MG/3ML) 0.083% nebulizer solution 2.5 mg (Completed)   levofloxacin (LEVAQUIN) 500 MG tablet   predniSONE (DELTASONE) 20 MG tablet   Other Relevant Orders   POCT Influenza A/B (Completed)   DG Chest 2 View      I am having [name] start on levofloxacin and predniSONE. I am also having her maintain her HUMIRA PEN, leflunomide, predniSONE, cholecalciferol, fluticasone furoate-vilanterol, furosemide, zolpidem, gabapentin, and traMADol. We administered albuterol.   Meds ordered this encounter  Medications  . albuterol (PROVENTIL) (2.5 MG/3ML) 0.083% nebulizer solution 2.5 mg    Sig:   . levofloxacin (LEVAQUIN) 500 MG tablet    Sig: Take 1 tablet (500 mg total) by mouth daily.    Dispense:  7 tablet    Refill:  0    Order Specific Question:  Supervising Provider    Answer:  Pricilla Holm A [id number]  . predniSONE (DELTASONE) 20 MG tablet    Sig: Take 1 tablet (20 mg total) by mouth 2 (two) times daily with a meal.    Dispense:  10 tablet    Refill:  0    Order Specific Question:  Supervising Provider    Answer:  Pricilla Holm A [id number]  . traMADol (ULTRAM) 50 MG tablet    Sig: Take 1-2 tablets (50-100 mg total) by mouth 2 (two) times daily as needed.    Dispense:  120 tablet    Refill:  0    120 per 1 month    Order Specific Question:  Supervising Provider    Answer:  Pricilla Holm A [id number]     Follow-up: Return if symptoms worsen or fail to improve.  Mauricio Po, FNP

## 2015-09-18 ENCOUNTER — Telehealth: Payer: Self-pay | Admitting: Family

## 2015-09-18 NOTE — Telephone Encounter (Signed)
Please inform patient that her chest x-ray was negative with no evidence of pneumonia. Therefore this is most likely a COPD exacerbation/bronchitis. Please continue medications as prescribed and follow-up if symptoms worsen or fail to improve.

## 2015-09-19 MED ORDER — ONDANSETRON 4 MG PO TBDP: 4.0000 mg | ORAL_TABLET | Freq: Three times a day (TID) | ORAL | Status: DC | PRN

## 2015-09-19 NOTE — Telephone Encounter (Signed)
She states that she can not eat anything. Feels like the food is wanting to come back up. Says her stomach "rolls" is there anything you recommend her take or do until she sees plot on Monday?

## 2015-09-19 NOTE — Telephone Encounter (Signed)
Zofran sent to pharmacy

## 2015-09-20 NOTE — Telephone Encounter (Signed)
Pt aware.

## 2015-09-23 ENCOUNTER — Ambulatory Visit (INDEPENDENT_AMBULATORY_CARE_PROVIDER_SITE_OTHER): Payer: Managed Care, Other (non HMO) | Admitting: Internal Medicine

## 2015-09-23 ENCOUNTER — Encounter: Payer: Self-pay | Admitting: Internal Medicine

## 2015-09-23 VITALS — BP 110/68 | HR 77 | Temp 98.6°F | Wt 117.0 lb

## 2015-09-23 DIAGNOSIS — J449 Chronic obstructive pulmonary disease, unspecified: Secondary | ICD-10-CM | POA: Diagnosis not present

## 2015-09-23 DIAGNOSIS — R197 Diarrhea, unspecified: Secondary | ICD-10-CM | POA: Insufficient documentation

## 2015-09-23 DIAGNOSIS — R062 Wheezing: Secondary | ICD-10-CM | POA: Diagnosis not present

## 2015-09-23 DIAGNOSIS — G4700 Insomnia, unspecified: Secondary | ICD-10-CM

## 2015-09-23 MED ORDER — ZOLPIDEM TARTRATE 10 MG PO TABS: ORAL_TABLET | ORAL | Status: DC

## 2015-09-23 MED ORDER — IPRATROPIUM-ALBUTEROL 0.5-2.5 (3) MG/3ML IN SOLN: 3.0000 mL | RESPIRATORY_TRACT | Status: DC | PRN

## 2015-09-23 NOTE — Progress Notes (Signed)
Pre visit review using our clinic review tool, if applicable. No additional management support is needed unless otherwise documented below in the visit note. 

## 2015-09-23 NOTE — Progress Notes (Signed)
Subjective:  Patient ID: Debra Farmer, female    DOB: 08/12/1945  Age: 70 y.o. MRN: [id number]  CC: No chief complaint on file.   HPI Debra Farmer presents for COPD - worse, RA, insomnia f/u. Pt had diarrhea - it stopped. Pt was very sick last week. F/u on insomnia  Outpatient Prescriptions Prior to Visit  Medication Sig Dispense Refill  . cholecalciferol (VITAMIN D) 1000 UNITS tablet Take 1 tablet (1,000 Units total) by mouth daily. 100 tablet 3  . Fluticasone Furoate-Vilanterol (BREO ELLIPTA) 100-25 MCG/INH AEPB Inhale 1 Act into the lungs daily. 1 each 5  . furosemide (LASIX) 20 MG tablet TAKE 1 TABLET BY MOUTH AS NEEDED FOR FLUID 30 tablet 1  . gabapentin (NEURONTIN) 100 MG capsule One pill every eight hours as needed; dose may be increased by one pill each dose after 72 hours if only partially effective 30 capsule 2  . HUMIRA PEN 40 MG/0.8ML PNKT every 14 (fourteen) days.    [name] leflunomide (ARAVA) 10 MG tablet Take 10 mg by mouth daily.  0  . levofloxacin (LEVAQUIN) 500 MG tablet Take 1 tablet (500 mg total) by mouth daily. 7 tablet 0  . ondansetron (ZOFRAN-ODT) 4 MG disintegrating tablet Take 1 tablet (4 mg total) by mouth every 8 (eight) hours as needed for nausea or vomiting. 20 tablet 0  . predniSONE (DELTASONE) 1 MG tablet TAKE 1-4 TABS AS DIRECTED DAILY  3  . predniSONE (DELTASONE) 20 MG tablet Take 1 tablet (20 mg total) by mouth 2 (two) times daily with a meal. 10 tablet 0  . traMADol (ULTRAM) 50 MG tablet Take 1-2 tablets (50-100 mg total) by mouth 2 (two) times daily as needed. 120 tablet 0  . zolpidem (AMBIEN) 10 MG tablet TAKE 1 TABLET BY MOUTH AT BEDTIME AS NEEDED FOR SLEEP 30 tablet 5   No facility-administered medications prior to visit.    ROS Review of Systems  Constitutional: Positive for fatigue. Negative for chills, activity change, appetite change and unexpected weight change.  HENT: Negative for congestion, mouth sores and sinus pressure.   Eyes:  Negative for visual disturbance.  Respiratory: Negative for cough and chest tightness.   Gastrointestinal: Positive for diarrhea. Negative for nausea and abdominal pain.  Genitourinary: Negative for frequency, difficulty urinating and vaginal pain.  Musculoskeletal: Positive for back pain, arthralgias and neck stiffness. Negative for gait problem.  Skin: Negative for pallor and rash.  Neurological: Negative for dizziness, tremors, weakness, numbness and headaches.  Psychiatric/Behavioral: Negative for confusion and sleep disturbance. The patient is nervous/anxious.     Objective:  BP 110/68 mmHg  Pulse 77  Temp(Src) 98.6 F (37 C) (Oral)  Wt 117 lb (53.071 kg)  SpO2 95%  BP Readings from Last 3 Encounters:  09/23/15 110/68  09/17/15 132/68  08/01/15 128/64    Wt Readings from Last 3 Encounters:  09/23/15 117 lb (53.071 kg)  09/17/15 120 lb (54.432 kg)  08/01/15 124 lb 8 oz (56.473 kg)    Physical Exam  Constitutional: She appears well-developed. No distress.  HENT:  Head: Normocephalic.  Right Ear: External ear normal.  Left Ear: External ear normal.  Nose: Nose normal.  Mouth/Throat: Oropharynx is clear and moist.  Eyes: Conjunctivae are normal. Pupils are equal, round, and reactive to light. Right eye exhibits no discharge. Left eye exhibits no discharge.  Neck: Normal range of motion. Neck supple. No JVD present. No tracheal deviation present. No thyromegaly present.  Cardiovascular: Normal rate, regular  rhythm and normal heart sounds.   Pulmonary/Chest: No stridor. No respiratory distress. She has wheezes.  Abdominal: Soft. Bowel sounds are normal. She exhibits no distension and no mass. There is no tenderness. There is no rebound and no guarding.  Musculoskeletal: She exhibits no edema or tenderness.  Lymphadenopathy:    She has no cervical adenopathy.  Neurological: She displays normal reflexes. No cranial nerve deficit. She exhibits normal muscle tone.  Coordination normal.  Skin: No rash noted. No erythema.  Psychiatric: She has a normal mood and affect. Her behavior is normal. Judgment and thought content normal.    Lab Results  Component Value Date   WBC 8.6 12/05/2014   HGB 16.1* 12/05/2014   HCT 48.2* 12/05/2014   PLT 272.0 12/05/2014   GLUCOSE 97 12/05/2014   CHOL 257* 12/05/2014   TRIG 219.0* 12/05/2014   HDL 40.30 12/05/2014   LDLDIRECT 170.0 12/05/2014   LDLCALC 166* 09/13/2013   ALT 18 12/05/2014   AST 21 12/05/2014   NA 139 12/05/2014   K 3.7 12/05/2014   CL 104 12/05/2014   CREATININE 0.64 12/05/2014   BUN 16 12/05/2014   CO2 25 12/05/2014   TSH 1.63 12/05/2014    Dg Chest 2 View  09/18/2015  CLINICAL DATA:  Cough, congestion, low-grade fever for 4 days, COPD, smoker EXAM: CHEST  2 VIEW COMPARISON:  09/19/2012 FINDINGS: Cardiomediastinal silhouette is stable. Mild hyperinflation. Minimal infrahilar bronchitic changes. No acute infiltrate or pulmonary edema. Mild degenerative changes mid thoracic spine. IMPRESSION: No infiltrate or pulmonary edema. Mild hyperinflation. Minimal infrahilar bronchitic changes. Electronically Signed   By: Lahoma Crocker M.D.   On: 09/18/2015 08:09    Assessment & Plan:   There are no diagnoses linked to this encounter. I am having Debra Farmer maintain her HUMIRA PEN, leflunomide, predniSONE, cholecalciferol, fluticasone furoate-vilanterol, furosemide, zolpidem, gabapentin, levofloxacin, predniSONE, traMADol, and ondansetron.  No orders of the defined types were placed in this encounter.     Follow-up: No Follow-up on file.  Walker Kehr, MD

## 2015-09-23 NOTE — Assessment & Plan Note (Signed)
Duoneb [facility] ([facility])

## 2015-09-23 NOTE — Assessment & Plan Note (Signed)
Resolving

## 2015-09-23 NOTE — Assessment & Plan Note (Signed)
[facility] Rx here at the office [facility] for home - will arrange for it (Duoneb)

## 2015-09-23 NOTE — Assessment & Plan Note (Signed)
Zolpidem prn  Potential benefits of a long term benzodiazepines  use as well as potential risks  and complications were explained to the patient and were aknowledged. 

## 2015-10-07 DIAGNOSIS — M7989 Other specified soft tissue disorders: Secondary | ICD-10-CM | POA: Diagnosis not present

## 2015-10-07 DIAGNOSIS — R768 Other specified abnormal immunological findings in serum: Secondary | ICD-10-CM | POA: Diagnosis not present

## 2015-10-07 DIAGNOSIS — Z79899 Other long term (current) drug therapy: Secondary | ICD-10-CM | POA: Diagnosis not present

## 2015-10-07 DIAGNOSIS — M2550 Pain in unspecified joint: Secondary | ICD-10-CM | POA: Diagnosis not present

## 2015-10-07 DIAGNOSIS — M0609 Rheumatoid arthritis without rheumatoid factor, multiple sites: Secondary | ICD-10-CM | POA: Diagnosis not present

## 2015-10-07 DIAGNOSIS — M150 Primary generalized (osteo)arthritis: Secondary | ICD-10-CM | POA: Diagnosis not present

## 2015-10-11 ENCOUNTER — Other Ambulatory Visit: Payer: Self-pay | Admitting: Family

## 2015-10-14 ENCOUNTER — Other Ambulatory Visit: Payer: Self-pay | Admitting: Family

## 2015-10-16 NOTE — Telephone Encounter (Signed)
Called refill into [facility]...Debra Farmer

## 2016-01-06 DIAGNOSIS — M0609 Rheumatoid arthritis without rheumatoid factor, multiple sites: Secondary | ICD-10-CM | POA: Diagnosis not present

## 2016-01-10 ENCOUNTER — Telehealth: Payer: Self-pay | Admitting: Emergency Medicine

## 2016-01-10 MED ORDER — TRAMADOL HCL 50 MG PO TABS: ORAL_TABLET | ORAL | 0 refills | Status: DC

## 2016-01-10 NOTE — Telephone Encounter (Signed)
Medication printed to be faxed.  

## 2016-01-10 NOTE — Telephone Encounter (Signed)
Faxed to [facility]

## 2016-01-10 NOTE — Telephone Encounter (Signed)
Please advise in dr plotnikovs absence, thanks

## 2016-01-10 NOTE — Telephone Encounter (Signed)
Pt called and needs a refill on traMADol (ULTRAM) 50 MG tablet [id number]. Pt states its usually faxed to [facility] store but she can come pick it up if she needs to. Please follow up thanks.

## 2016-02-06 ENCOUNTER — Telehealth: Payer: Self-pay

## 2016-02-06 ENCOUNTER — Other Ambulatory Visit: Payer: Self-pay

## 2016-02-06 NOTE — Telephone Encounter (Signed)
Pt requesting refill of tramadol. Please advise

## 2016-02-06 NOTE — Telephone Encounter (Signed)
Patient is requesting a re-fill of Tramadol.  Please advise.

## 2016-02-06 NOTE — Telephone Encounter (Signed)
OK to fill this prescription with additional refills x0 Sch OV Thank you!

## 2016-02-07 ENCOUNTER — Other Ambulatory Visit: Payer: Self-pay

## 2016-02-07 MED ORDER — TRAMADOL HCL 50 MG PO TABS: ORAL_TABLET | ORAL | 0 refills | Status: DC

## 2016-02-07 NOTE — Telephone Encounter (Signed)
Refill

## 2016-02-07 NOTE — Telephone Encounter (Signed)
Rf phoned in. See meds. OV is 02/10/16.

## 2016-02-10 ENCOUNTER — Encounter: Payer: Self-pay | Admitting: Internal Medicine

## 2016-02-10 ENCOUNTER — Ambulatory Visit (INDEPENDENT_AMBULATORY_CARE_PROVIDER_SITE_OTHER): Payer: Managed Care, Other (non HMO) | Admitting: Internal Medicine

## 2016-02-10 ENCOUNTER — Ambulatory Visit (INDEPENDENT_AMBULATORY_CARE_PROVIDER_SITE_OTHER)
Admission: RE | Admit: 2016-02-10 | Discharge: 2016-02-10 | Disposition: A | Discharge status: Home / Self Care | Payer: Managed Care, Other (non HMO) | Source: Ambulatory Visit | Attending: Internal Medicine | Admitting: Internal Medicine

## 2016-02-10 VITALS — BP 130/70 | HR 81 | Temp 98.9°F | Wt 117.0 lb

## 2016-02-10 DIAGNOSIS — M0600 Rheumatoid arthritis without rheumatoid factor, unspecified site: Secondary | ICD-10-CM | POA: Diagnosis not present

## 2016-02-10 DIAGNOSIS — M5412 Radiculopathy, cervical region: Secondary | ICD-10-CM | POA: Diagnosis not present

## 2016-02-10 DIAGNOSIS — Z23 Encounter for immunization: Secondary | ICD-10-CM

## 2016-02-10 DIAGNOSIS — G5731 Lesion of lateral popliteal nerve, right lower limb: Secondary | ICD-10-CM

## 2016-02-10 DIAGNOSIS — M19071 Primary osteoarthritis, right ankle and foot: Secondary | ICD-10-CM | POA: Diagnosis not present

## 2016-02-10 MED ORDER — ZOLPIDEM TARTRATE 10 MG PO TABS: ORAL_TABLET | ORAL | 3 refills | Status: DC

## 2016-02-10 MED ORDER — GABAPENTIN 100 MG PO CAPS: 100.0000 mg | ORAL_CAPSULE | Freq: Every day | ORAL | 2 refills | Status: DC

## 2016-02-10 MED ORDER — TRAMADOL HCL 50 MG PO TABS: ORAL_TABLET | ORAL | 1 refills | Status: DC

## 2016-02-10 NOTE — Addendum Note (Signed)
Addended by: Cresenciano Lick on: 02/10/2016 04:46 PM   Modules accepted: Orders

## 2016-02-10 NOTE — Progress Notes (Signed)
Pre visit review using our clinic review tool, if applicable. No additional management support is needed unless otherwise documented below in the visit note. 

## 2016-02-10 NOTE — Assessment & Plan Note (Addendum)
On Humira+ Arava +Prednisone 3 mg/d Tramadol prn  Potential benefits of a long term opioids use as well as potential risks (i.e. addiction risk, apnea etc) and complications (i.e. Somnolence, constipation and others) were explained to the patient and were aknowledged.

## 2016-02-10 NOTE — Progress Notes (Signed)
Subjective:  Patient ID: Debra Farmer, female    DOB: 1945-10-16  Age: 70 y.o. MRN: [id number]  CC: Numbness (Right foot with throbbing pain)   HPI Debra Farmer presents for R foot numbness and throbbing pain at night x 2 mo - worse  Outpatient Medications Prior to Visit  Medication Sig Dispense Refill  . cholecalciferol (VITAMIN D) 1000 UNITS tablet Take 1 tablet (1,000 Units total) by mouth daily. 100 tablet 3  . Fluticasone Furoate-Vilanterol (BREO ELLIPTA) 100-25 MCG/INH AEPB Inhale 1 Act into the lungs daily. 1 each 5  . furosemide (LASIX) 20 MG tablet TAKE 1 TABLET BY MOUTH AS NEEDED FOR FLUID 30 tablet 1  . HUMIRA PEN 40 MG/0.8ML PNKT every 14 (fourteen) days.    [name] ipratropium-albuterol (DUONEB) 0.5-2.5 (3) MG/3ML SOLN Take 3 mLs by nebulization every 4 (four) hours as needed. 360 mL 3  . ondansetron (ZOFRAN-ODT) 4 MG disintegrating tablet Take 1 tablet (4 mg total) by mouth every 8 (eight) hours as needed for nausea or vomiting. 20 tablet 0  . predniSONE (DELTASONE) 1 MG tablet TAKE 1-4 TABS AS DIRECTED DAILY  3  . traMADol (ULTRAM) 50 MG tablet TAKE 1 TO 2 TABLETS BY MOUTH TWICE DAILYAS NEEDED 120 tablet 0  . zolpidem (AMBIEN) 10 MG tablet TAKE 1 TABLET BY MOUTH AT BEDTIME AS NEEDED FOR SLEEP 30 tablet 5  . leflunomide (ARAVA) 10 MG tablet Take 10 mg by mouth daily.  0  . gabapentin (NEURONTIN) 100 MG capsule One pill every eight hours as needed; dose may be increased by one pill each dose after 72 hours if only partially effective (Patient not taking: Reported on 02/10/2016) 30 capsule 2   No facility-administered medications prior to visit.     ROS Review of Systems  Constitutional: Negative for activity change, appetite change, chills, fatigue and unexpected weight change.  HENT: Negative for congestion, mouth sores and sinus pressure.   Eyes: Negative for visual disturbance.  Respiratory: Negative for cough and chest tightness.   Gastrointestinal: Negative for  abdominal pain, nausea and vomiting.  Genitourinary: Negative for difficulty urinating, frequency and vaginal pain.  Musculoskeletal: Positive for arthralgias. Negative for back pain and gait problem.  Skin: Negative for pallor and rash.  Neurological: Negative for dizziness, tremors, weakness, numbness and headaches.  Hematological: Bruises/bleeds easily.  Psychiatric/Behavioral: Positive for sleep disturbance. Negative for confusion.    Objective:  BP 130/70   Pulse 81   Temp 98.9 F (37.2 C) (Oral)   Wt 117 lb (53.1 kg)   SpO2 92%   BMI 22.85 kg/m   BP Readings from Last 3 Encounters:  02/10/16 130/70  09/23/15 110/68  09/17/15 132/68    Wt Readings from Last 3 Encounters:  02/10/16 117 lb (53.1 kg)  09/23/15 117 lb (53.1 kg)  09/17/15 120 lb (54.4 kg)    Physical Exam  Constitutional: She appears well-developed. No distress.  HENT:  Head: Normocephalic.  Right Ear: External ear normal.  Left Ear: External ear normal.  Nose: Nose normal.  Mouth/Throat: Oropharynx is clear and moist.  Eyes: Conjunctivae are normal. Pupils are equal, round, and reactive to light. Right eye exhibits no discharge. Left eye exhibits no discharge.  Neck: Normal range of motion. Neck supple. No JVD present. No tracheal deviation present. No thyromegaly present.  Cardiovascular: Normal rate, regular rhythm and normal heart sounds.   Pulmonary/Chest: No stridor. No respiratory distress. She has no wheezes.  Abdominal: Soft. Bowel sounds are normal. She  exhibits no distension and no mass. There is no tenderness. There is no rebound and no guarding.  Musculoskeletal: She exhibits tenderness. She exhibits no edema.  Lymphadenopathy:    She has no cervical adenopathy.  Neurological: She displays normal reflexes. No cranial nerve deficit. She exhibits normal muscle tone. Coordination normal.  Skin: No rash noted. No erythema.  Psychiatric: She has a normal mood and affect. Her behavior is  normal. Judgment and thought content normal.    Lab Results  Component Value Date   WBC 8.6 12/05/2014   HGB 16.1 (H) 12/05/2014   HCT 48.2 (H) 12/05/2014   PLT 272.0 12/05/2014   GLUCOSE 97 12/05/2014   CHOL 257 (H) 12/05/2014   TRIG 219.0 (H) 12/05/2014   HDL 40.30 12/05/2014   LDLDIRECT 170.0 12/05/2014   LDLCALC 166 (H) 09/13/2013   ALT 18 12/05/2014   AST 21 12/05/2014   NA 139 12/05/2014   K 3.7 12/05/2014   CL 104 12/05/2014   CREATININE 0.64 12/05/2014   BUN 16 12/05/2014   CO2 25 12/05/2014   TSH 1.63 12/05/2014    Dg Chest 2 View  Result Date: 09/18/2015 CLINICAL DATA:  Cough, congestion, low-grade fever for 4 days, COPD, smoker EXAM: CHEST  2 VIEW COMPARISON:  09/19/2012 FINDINGS: Cardiomediastinal silhouette is stable. Mild hyperinflation. Minimal infrahilar bronchitic changes. No acute infiltrate or pulmonary edema. Mild degenerative changes mid thoracic spine. IMPRESSION: No infiltrate or pulmonary edema. Mild hyperinflation. Minimal infrahilar bronchitic changes. Electronically Signed   By: Lahoma Crocker M.D.   On: 09/18/2015 08:09    Assessment & Plan:   There are no diagnoses linked to this encounter. I am having [name] maintain her HUMIRA PEN, predniSONE, cholecalciferol, fluticasone furoate-vilanterol, furosemide, gabapentin, ondansetron, ipratropium-albuterol, zolpidem, traMADol, and leflunomide.  Meds ordered this encounter  Medications  . leflunomide (ARAVA) 20 MG tablet    Sig: Take 1 tablet by mouth daily.     Follow-up: No Follow-up on file.  Walker Kehr, MD

## 2016-02-10 NOTE — Assessment & Plan Note (Addendum)
Lateral aspect Re-start Gabapentin F/u w/Dr Doran Durand Tramadol prn  Potential benefits of a long term opioids use as well as potential risks (i.e. addiction risk, apnea etc) and complications (i.e. Somnolence, constipation and others) were explained to the patient and were aknowledged.

## 2016-03-31 DIAGNOSIS — M47816 Spondylosis without myelopathy or radiculopathy, lumbar region: Secondary | ICD-10-CM | POA: Diagnosis not present

## 2016-03-31 DIAGNOSIS — M5136 Other intervertebral disc degeneration, lumbar region: Secondary | ICD-10-CM | POA: Diagnosis not present

## 2016-03-31 DIAGNOSIS — M5441 Lumbago with sciatica, right side: Secondary | ICD-10-CM | POA: Diagnosis not present

## 2016-04-08 DIAGNOSIS — M79671 Pain in right foot: Secondary | ICD-10-CM | POA: Diagnosis not present

## 2016-04-08 DIAGNOSIS — M150 Primary generalized (osteo)arthritis: Secondary | ICD-10-CM | POA: Diagnosis not present

## 2016-04-08 DIAGNOSIS — M0609 Rheumatoid arthritis without rheumatoid factor, multiple sites: Secondary | ICD-10-CM | POA: Diagnosis not present

## 2016-04-08 DIAGNOSIS — M2550 Pain in unspecified joint: Secondary | ICD-10-CM | POA: Diagnosis not present

## 2016-04-08 DIAGNOSIS — Z79899 Other long term (current) drug therapy: Secondary | ICD-10-CM | POA: Diagnosis not present

## 2016-04-27 NOTE — Progress Notes (Deleted)
  [name] [place],  [address] Phone: [phone] Subjective:    I'm seeing this patient by the request  of:    CC:   [id number]  Debra Farmer is a 70 y.o. female coming in with complaint of ***     Past Medical History:  Diagnosis Date  . Arthritis   . COPD (chronic obstructive pulmonary disease) ([place])    Past Surgical History:  Procedure Laterality Date  . ABDOMINAL HYSTERECTOMY     partial  . APPENDECTOMY    . CHOLECYSTECTOMY    . KNEE ARTHROSCOPY  1999   Dr Theda Sers -- R knee   Social History   Social History  . Marital status: Married    Spouse name: N/A  . Number of children: N/A  . Years of education: N/A   Social History Main Topics  . Smoking status: Current Every Day Smoker    Packs/day: 0.50  . Smokeless tobacco: Not on file  . Alcohol use No  . Drug use: No  . Sexual activity: Yes   Other Topics Concern  . Not on file   Social History Narrative  . No narrative on file   Allergies  Allergen Reactions  . Aspirin     REACTION: upset stomach  . Codeine Sulfate   . Lipitor [Atorvastatin]     cramps  . Nabumetone    Family History  Problem Relation Age of Onset  . Heart disease Mother 41    CHF  . COPD Father 41    emphesema    Past medical history, social, surgical and family history all reviewed in electronic medical record.  No pertanent information unless stated regarding to the chief complaint.   Review of Systems:Review of systems updated and as accurate as of 04/27/16  No headache, visual changes, nausea, vomiting, diarrhea, constipation, dizziness, abdominal pain, skin rash, fevers, chills, night sweats, weight loss, swollen lymph nodes, body aches, joint swelling, muscle aches, chest pain, shortness of breath, mood changes.   Objective  There were no vitals taken for this visit. Systems examined below as of 04/27/16   General: No apparent distress alert and oriented  x3 mood and affect normal, dressed appropriately.  HEENT: Pupils equal, extraocular movements intact  Respiratory: Patient's speak in full sentences and does not appear short of breath  Cardiovascular: No lower extremity edema, non tender, no erythema  Skin: Warm dry intact with no signs of infection or rash on extremities or on axial skeleton.  Abdomen: Soft nontender  Neuro: Cranial nerves II through XII are intact, neurovascularly intact in all extremities with 2+ DTRs and 2+ pulses.  Lymph: No lymphadenopathy of posterior or anterior cervical chain or axillae bilaterally.  Gait normal with good balance and coordination.  MSK:  Non tender with full range of motion and good stability and symmetric strength and tone of shoulders, elbows, wrist, hip, knee and ankles bilaterally.     Impression and Recommendations:     This case required medical decision making of moderate complexity.      Note: This dictation was prepared with Dragon dictation along with smaller phrase technology. Any transcriptional errors that result from this process are unintentional.

## 2016-04-28 ENCOUNTER — Encounter: Payer: Self-pay | Admitting: Family Medicine

## 2016-04-28 ENCOUNTER — Ambulatory Visit: Payer: Managed Care, Other (non HMO) | Admitting: Family Medicine

## 2016-04-28 ENCOUNTER — Ambulatory Visit: Payer: Self-pay

## 2016-04-28 ENCOUNTER — Encounter: Payer: Self-pay | Admitting: Internal Medicine

## 2016-04-28 ENCOUNTER — Encounter: Payer: Self-pay | Admitting: *Deleted

## 2016-04-28 ENCOUNTER — Ambulatory Visit (INDEPENDENT_AMBULATORY_CARE_PROVIDER_SITE_OTHER): Payer: Managed Care, Other (non HMO) | Admitting: Family Medicine

## 2016-04-28 ENCOUNTER — Ambulatory Visit (INDEPENDENT_AMBULATORY_CARE_PROVIDER_SITE_OTHER): Payer: Managed Care, Other (non HMO) | Admitting: Internal Medicine

## 2016-04-28 ENCOUNTER — Ambulatory Visit (INDEPENDENT_AMBULATORY_CARE_PROVIDER_SITE_OTHER)
Admission: RE | Admit: 2016-04-28 | Discharge: 2016-04-28 | Disposition: A | Discharge status: Home / Self Care | Payer: Managed Care, Other (non HMO) | Source: Ambulatory Visit | Attending: Family Medicine | Admitting: Family Medicine

## 2016-04-28 VITALS — BP 130/78 | HR 84 | Ht 60.0 in | Wt 115.0 lb

## 2016-04-28 DIAGNOSIS — M0600 Rheumatoid arthritis without rheumatoid factor, unspecified site: Secondary | ICD-10-CM

## 2016-04-28 DIAGNOSIS — M1611 Unilateral primary osteoarthritis, right hip: Secondary | ICD-10-CM | POA: Diagnosis not present

## 2016-04-28 DIAGNOSIS — M25551 Pain in right hip: Secondary | ICD-10-CM

## 2016-04-28 DIAGNOSIS — G4700 Insomnia, unspecified: Secondary | ICD-10-CM

## 2016-04-28 DIAGNOSIS — M7061 Trochanteric bursitis, right hip: Secondary | ICD-10-CM

## 2016-04-28 DIAGNOSIS — M169 Osteoarthritis of hip, unspecified: Secondary | ICD-10-CM | POA: Insufficient documentation

## 2016-04-28 MED ORDER — ZOLPIDEM TARTRATE 10 MG PO TABS: ORAL_TABLET | ORAL | 3 refills | Status: DC

## 2016-04-28 MED ORDER — TRAMADOL HCL 50 MG PO TABS: ORAL_TABLET | ORAL | 1 refills | Status: DC

## 2016-04-28 NOTE — Assessment & Plan Note (Signed)
Zolpidem prn 

## 2016-04-28 NOTE — Assessment & Plan Note (Signed)
bursitis vs R hip RA R/o AVN Sports med consult today

## 2016-04-28 NOTE — Patient Instructions (Signed)
Happy holidays! Have a great trip Xray downstairs.  Ice is your friend Ice 20 minutes 2 times daily. Usually after activity and before bed.  pennsaid pinkie amount topically 2 times daily as needed.   Add tylenol to the tramadol can make it help a lot See me again when you get back if still having the pain

## 2016-04-28 NOTE — Progress Notes (Signed)
Subjective:  Patient ID: Debra Farmer, female    DOB: 1945-06-28  Age: 70 y.o. MRN: [id number]  CC: Hip Pain (Right)   HPI Debra Farmer presents for R foot pain due to radiculopathy. C/o severe R hip/groin pain 10/10 w/ROM - worse now. She is going on a cruise next week F/u insomnia  Outpatient Medications Prior to Visit  Medication Sig Dispense Refill  . Fluticasone Furoate-Vilanterol (BREO ELLIPTA) 100-25 MCG/INH AEPB Inhale 1 Act into the lungs daily. 1 each 5  . furosemide (LASIX) 20 MG tablet TAKE 1 TABLET BY MOUTH AS NEEDED FOR FLUID 30 tablet 1  . gabapentin (NEURONTIN) 100 MG capsule Take 1-3 capsules (100-300 mg total) by mouth at bedtime. One pill every eight hours as needed; dose may be increased by one pill each dose after 72 hours if only partially effective 30 capsule 2  . HUMIRA PEN 40 MG/0.8ML PNKT every 14 (fourteen) days.    [name] ipratropium-albuterol (DUONEB) 0.5-2.5 (3) MG/3ML SOLN Take 3 mLs by nebulization every 4 (four) hours as needed. 360 mL 3  . leflunomide (ARAVA) 20 MG tablet Take 1 tablet by mouth daily.    . ondansetron (ZOFRAN-ODT) 4 MG disintegrating tablet Take 1 tablet (4 mg total) by mouth every 8 (eight) hours as needed for nausea or vomiting. 20 tablet 0  . predniSONE (DELTASONE) 1 MG tablet TAKE 1-4 TABS AS DIRECTED DAILY  3  . traMADol (ULTRAM) 50 MG tablet TAKE 1 TO 2 TABLETS BY MOUTH TWICE DAILYAS NEEDED 120 tablet 1  . zolpidem (AMBIEN) 10 MG tablet TAKE 1 TABLET BY MOUTH AT BEDTIME AS NEEDED FOR SLEEP 30 tablet 3   No facility-administered medications prior to visit.     ROS Review of Systems  Constitutional: Negative for activity change, appetite change, chills, fatigue and unexpected weight change.  HENT: Negative for congestion, mouth sores and sinus pressure.   Eyes: Negative for visual disturbance.  Respiratory: Negative for cough and chest tightness.   Gastrointestinal: Negative for abdominal pain and nausea.  Genitourinary:  Negative for difficulty urinating, frequency and vaginal pain.  Musculoskeletal: Positive for arthralgias, back pain and gait problem.  Skin: Negative for pallor and rash.  Neurological: Negative for dizziness, tremors, weakness, numbness and headaches.  Psychiatric/Behavioral: Negative for confusion and sleep disturbance.    Objective:  BP 130/78   Pulse 84   Wt 115 lb (52.2 kg)   SpO2 96%   BMI 22.46 kg/m   BP Readings from Last 3 Encounters:  04/28/16 130/78  02/10/16 130/70  09/23/15 110/68    Wt Readings from Last 3 Encounters:  04/28/16 115 lb (52.2 kg)  02/10/16 117 lb (53.1 kg)  09/23/15 117 lb (53.1 kg)    Physical Exam  Constitutional: She appears well-developed. No distress.  HENT:  Head: Normocephalic.  Right Ear: External ear normal.  Left Ear: External ear normal.  Nose: Nose normal.  Mouth/Throat: Oropharynx is clear and moist.  Eyes: Conjunctivae are normal. Pupils are equal, round, and reactive to light. Right eye exhibits no discharge. Left eye exhibits no discharge.  Neck: Normal range of motion. Neck supple. No JVD present. No tracheal deviation present. No thyromegaly present.  Cardiovascular: Normal rate, regular rhythm and normal heart sounds.   Pulmonary/Chest: No stridor. No respiratory distress. She has no wheezes.  Abdominal: Soft. Bowel sounds are normal. She exhibits no distension and no mass. There is no tenderness. There is no rebound and no guarding.  Musculoskeletal: She exhibits tenderness.  She exhibits no edema.  Lymphadenopathy:    She has no cervical adenopathy.  Neurological: She displays normal reflexes. No cranial nerve deficit. She exhibits normal muscle tone. Coordination normal.  Skin: No rash noted. No erythema.  Psychiatric: She has a normal mood and affect. Her behavior is normal. Judgment and thought content normal.  R hip, R groin is very tender w/ROM  Lab Results  Component Value Date   WBC 8.6 12/05/2014   HGB 16.1  (H) 12/05/2014   HCT 48.2 (H) 12/05/2014   PLT 272.0 12/05/2014   GLUCOSE 97 12/05/2014   CHOL 257 (H) 12/05/2014   TRIG 219.0 (H) 12/05/2014   HDL 40.30 12/05/2014   LDLDIRECT 170.0 12/05/2014   LDLCALC 166 (H) 09/13/2013   ALT 18 12/05/2014   AST 21 12/05/2014   NA 139 12/05/2014   K 3.7 12/05/2014   CL 104 12/05/2014   CREATININE 0.64 12/05/2014   BUN 16 12/05/2014   CO2 25 12/05/2014   TSH 1.63 12/05/2014    Dg Foot Complete Right  Result Date: 02/11/2016 CLINICAL DATA:  Foot pain.  No known injury. EXAM: RIGHT FOOT COMPLETE - 3+ VIEW COMPARISON:  No prior . FINDINGS: Diffuse degenerative change. No acute bony or joint abnormality identified. No evidence of fracture or dislocation. IMPRESSION: Diffuse degenerative change.  No acute abnormality. Electronically Signed   By: Marcello Moores  Register   On: 02/11/2016 08:03    Assessment & Plan:   There are no diagnoses linked to this encounter. I am having Ms. Bader maintain her HUMIRA PEN, predniSONE, fluticasone furoate-vilanterol, furosemide, ondansetron, ipratropium-albuterol, leflunomide, gabapentin, traMADol, and zolpidem.  No orders of the defined types were placed in this encounter.    Follow-up: No Follow-up on file.  Walker Kehr, MD

## 2016-04-28 NOTE — Assessment & Plan Note (Signed)
Right hip pain likely moderate arthritis.  Xray pending.  RTC in 4 weeks may need to consider injection

## 2016-04-28 NOTE — Progress Notes (Signed)
Pre visit review using our clinic review tool, if applicable. No additional management support is needed unless otherwise documented below in the visit note. 

## 2016-04-28 NOTE — Assessment & Plan Note (Signed)
Do have some changes from meds recently stated

## 2016-04-28 NOTE — Assessment & Plan Note (Addendum)
Injected today  Tolerated the procedure well.  HEP given, discussed icing and given topical NSAIDs,  RTC in 4 weeks, likely some underlying arthritis of the hip with pain with internal ROM.

## 2016-04-28 NOTE — Progress Notes (Signed)
[name] [place], [place] [address] Phone: [phone] Subjective:    I'm seeing this patient by the request  of:  Walker Kehr, MD  CC: Right hip pain  [id number]  Debra Farmer is a 70 y.o. female coming in with complaint of right hip pain. Patient statesshe's been having more of a right l and now is radiating to her right groin. Past medical history significant for rheumatoid arthritis. Denies any trauma, and states that there is pain going minorly down her leg but not severe. Waking her up at night. Affecting daily activities. Patient is leaving the country in the near future and wants to be active. Patient has had a shot, side of her hip.  1 year ago that seemed to be very helpful. States it feels similar and would like same intervention.       Past Medical History:  Diagnosis Date  . Arthritis   . COPD (chronic obstructive pulmonary disease) ([place])    Past Surgical History:  Procedure Laterality Date  . ABDOMINAL HYSTERECTOMY     partial  . APPENDECTOMY    . CHOLECYSTECTOMY    . KNEE ARTHROSCOPY  1999   Dr Theda Sers -- R knee   Social History   Social History  . Marital status: Married    Spouse name: N/A  . Number of children: N/A  . Years of education: N/A   Social History Main Topics  . Smoking status: Current Every Day Smoker    Packs/day: 0.50    Types: Cigarettes  . Smokeless tobacco: Never Used  . Alcohol use No  . Drug use: No  . Sexual activity: Yes   Other Topics Concern  . None   Social History Narrative  . None   Allergies  Allergen Reactions  . Aspirin     REACTION: upset stomach  . Codeine Sulfate   . Lipitor [Atorvastatin]     cramps  . Nabumetone    Family History  Problem Relation Age of Onset  . Heart disease Mother 67    CHF  . COPD Father 58    emphesema    Past medical history, social, surgical and family history all reviewed in electronic medical record.  No pertanent  information unless stated regarding to the chief complaint.   Review of Systems:Review of systems updated and as accurate as of 04/28/16  No headache, visual changes, nausea, vomiting, diarrhea, constipation, dizziness, abdominal pain, skin rash, fevers, chills, night sweats, weight loss, swollen lymph nodes, body aches, joint swelling, muscle aches, chest pain, shortness of breath, mood changes.   Objective  Blood pressure 130/78, pulse 84, height 5' (1.524 m), weight 115 lb (52.2 kg), SpO2 96 %. Systems examined below as of 04/28/16   General: No apparent distress alert and oriented x3 mood and affect normal, dressed appropriately.  HEENT: Pupils equal, extraocular movements intact  Respiratory: Patient's speak in full sentences and does not appear short of breath  Cardiovascular: No lower extremity edema, non tender, no erythema  Skin: Warm dry intact with no signs of infection or rash on extremities or on axial skeleton.  Abdomen: Soft nontender  Neuro: Cranial nerves II through XII are intact, neurovascularly intact in all extremities with 2+ DTRs and 2+ pulses.  Lymph: No lymphadenopathy of posterior or anterior cervical chain or axillae bilaterally.  Gait normal with good balance and coordination.  MSK:  Non tender with full range of motion and good stability and symmetric  strength and tone of shoulders, elbows, wrist, knee and ankles bilaterally. Arthritis changes of multiple joints.  Hip: right ROM IR: 5 Deg with pain , ER: 35 Deg, Flexion: 100 Deg, Extension: 80 Deg, Abduction: 35 Deg, Adduction: 35 Deg Strength IR: 5/5, ER: 5/5, Flexion: 5/5, Extension: 5/5, Abduction: 5/5, Adduction: 5/5 Pelvic alignment unremarkable to inspection and palpation. Standing hip rotation and gait without trendelenburg sign  mildunsteadiness. Greater trochanter With severe tenderness.  No tenderness over piriformis and greater trochanter. + Faber Mild pain over right SI joint.    Procedure:  Real-time Ultrasound Guided Injection of right greater trochanteric bursitis secondary to patient's body habitus Device: GE Logiq E  Ultrasound guided injection is preferred based studies that show increased duration, increased effect, greater accuracy, decreased procedural pain, increased response rate, and decreased cost with ultrasound guided versus blind injection.  Verbal informed consent obtained.  Time-out conducted.  Noted no overlying erythema, induration, or other signs of local infection.  Skin prepped in a sterile fashion.  Local anesthesia: Topical Ethyl chloride.  With sterile technique and under real time ultrasound guidance:  Greater trochanteric area was visualized and patient's bursa was noted. A 22-gauge 3 inch needle was inserted and 4 cc of 0.5% Marcaine and 1 cc of Kenalog 40 mg/dL was injected. Pictures taken Completed without difficulty  Pain immediately resolved suggesting accurate placement of the medication.  Advised to call if fevers/chills, erythema, induration, drainage, or persistent bleeding.  Images permanently stored and available for review in the ultrasound unit.  Impression: Technically successful ultrasound guided injection.   Impression and Recommendations:     This case required medical decision making of moderate complexity.      Note: This dictation was prepared with Dragon dictation along with smaller phrase technology. Any transcriptional errors that result from this process are unintentional.

## 2016-04-29 DIAGNOSIS — M5441 Lumbago with sciatica, right side: Secondary | ICD-10-CM | POA: Diagnosis not present

## 2016-05-29 ENCOUNTER — Telehealth: Payer: Self-pay | Admitting: *Deleted

## 2016-05-29 NOTE — Telephone Encounter (Signed)
Pt called requesting x-rays of hips be faxed to Dr. Nelva Bush @ [facility].

## 2016-06-01 NOTE — Telephone Encounter (Signed)
Patient called to follow up on whether or not you faxed xrays. Advised per the note I could not tell for sure. You were unavailable. Please verify that you did fax xrays.

## 2016-06-01 NOTE — Telephone Encounter (Signed)
Made pt aware x-rays were faxed.

## 2016-06-03 DIAGNOSIS — M47816 Spondylosis without myelopathy or radiculopathy, lumbar region: Secondary | ICD-10-CM | POA: Diagnosis not present

## 2016-06-03 DIAGNOSIS — M5136 Other intervertebral disc degeneration, lumbar region: Secondary | ICD-10-CM | POA: Diagnosis not present

## 2016-06-13 DIAGNOSIS — M47816 Spondylosis without myelopathy or radiculopathy, lumbar region: Secondary | ICD-10-CM | POA: Diagnosis not present

## 2016-06-18 DIAGNOSIS — M47816 Spondylosis without myelopathy or radiculopathy, lumbar region: Secondary | ICD-10-CM | POA: Diagnosis not present

## 2016-06-18 DIAGNOSIS — M5136 Other intervertebral disc degeneration, lumbar region: Secondary | ICD-10-CM | POA: Diagnosis not present

## 2016-06-24 ENCOUNTER — Other Ambulatory Visit: Payer: Self-pay | Admitting: Internal Medicine

## 2016-06-24 DIAGNOSIS — M5441 Lumbago with sciatica, right side: Secondary | ICD-10-CM | POA: Diagnosis not present

## 2016-06-24 DIAGNOSIS — M47816 Spondylosis without myelopathy or radiculopathy, lumbar region: Secondary | ICD-10-CM | POA: Diagnosis not present

## 2016-06-24 DIAGNOSIS — M5136 Other intervertebral disc degeneration, lumbar region: Secondary | ICD-10-CM | POA: Diagnosis not present

## 2016-06-25 NOTE — Telephone Encounter (Signed)
Rx did not printed due to MD printing at home. Called refill into pharmacy spoke w/Wayne gave MD approval.../lmb

## 2016-07-08 DIAGNOSIS — M47816 Spondylosis without myelopathy or radiculopathy, lumbar region: Secondary | ICD-10-CM | POA: Diagnosis not present

## 2016-07-08 DIAGNOSIS — M5136 Other intervertebral disc degeneration, lumbar region: Secondary | ICD-10-CM | POA: Diagnosis not present

## 2016-07-14 DIAGNOSIS — M150 Primary generalized (osteo)arthritis: Secondary | ICD-10-CM | POA: Diagnosis not present

## 2016-07-14 DIAGNOSIS — Z79899 Other long term (current) drug therapy: Secondary | ICD-10-CM | POA: Diagnosis not present

## 2016-07-14 DIAGNOSIS — M2550 Pain in unspecified joint: Secondary | ICD-10-CM | POA: Diagnosis not present

## 2016-07-14 DIAGNOSIS — M79671 Pain in right foot: Secondary | ICD-10-CM | POA: Diagnosis not present

## 2016-07-14 DIAGNOSIS — M0609 Rheumatoid arthritis without rheumatoid factor, multiple sites: Secondary | ICD-10-CM | POA: Diagnosis not present

## 2016-07-20 DIAGNOSIS — M792 Neuralgia and neuritis, unspecified: Secondary | ICD-10-CM | POA: Diagnosis not present

## 2016-07-20 DIAGNOSIS — M5441 Lumbago with sciatica, right side: Secondary | ICD-10-CM | POA: Diagnosis not present

## 2016-07-22 ENCOUNTER — Ambulatory Visit: Payer: Self-pay | Admitting: Orthopedic Surgery

## 2016-07-22 ENCOUNTER — Encounter (HOSPITAL_COMMUNITY): Payer: Self-pay | Admitting: *Deleted

## 2016-07-22 NOTE — H&P (Signed)
Debra Farmer is an 71 y.o. female.   Chief Complaint: back and R leg pain HPI: The patient is a 71 year old female who presents today for follow up of their back. The patient is being followed for their low back symptoms. They are now 4 1/2 months out from when symptoms began. Symptoms reported today include: leg pain (right). The patient states that they are doing poorly. Current treatment includes: relative rest, activity modification and pain medications. The following medication has been used for pain control: tramadol (during the day) and Norco (at night). The patient presents today following right L5 SNRB x 12 days. The patient reports the injection helped for 2 days.  Debra Farmer follows up. She had about a week or so relief from the injection Dr. Nelva Bush gave her L5 selective nerve root block. Now, it has returned and radiates down into buttock, thigh, and calf, top and medial aspect of the foot.  Review of systems is negative for fevers, chest pain, shortness of breath, unexplained recent weight loss, loss of bowel or bladder function, burning with urination, joint swelling, rashes, weakness or numbness, difficulty with balance, easy bruising, excessive thirst or frequent urination.  Past Medical History:  Diagnosis Date  . Arthritis   . COPD (chronic obstructive pulmonary disease) ([place])     Past Surgical History:  Procedure Laterality Date  . ABDOMINAL HYSTERECTOMY     partial  . APPENDECTOMY    . CHOLECYSTECTOMY    . KNEE ARTHROSCOPY  1999   Dr Theda Sers -- R knee    Family History  Problem Relation Age of Onset  . Heart disease Mother 17    CHF  . COPD Father 27    emphesema   Social History:  reports that she has been smoking Cigarettes.  She has been smoking about 0.50 packs per day. She has never used smokeless tobacco. She reports that she does not drink alcohol or use drugs.  Allergies:  Allergies  Allergen Reactions  . Aspirin     REACTION: upset stomach   . Codeine Sulfate Other (See Comments)    "drugged out feeling" & hallucinations.  . Lipitor [Atorvastatin]     cramps  . Nabumetone Other (See Comments)    Unsure of reaction (reaction occurred sometime ago)     (Not in a hospital admission)  No results found for this or any previous visit (from the past 48 hour(s)). No results found.  Review of Systems  Constitutional: Negative.   HENT: Negative.   Eyes: Negative.   Respiratory: Negative.   Cardiovascular: Negative.   Gastrointestinal: Negative.   Genitourinary: Negative.   Musculoskeletal: Positive for back pain.  Skin: Negative.   Neurological: Positive for sensory change and focal weakness.  Psychiatric/Behavioral: Negative.     There were no vitals taken for this visit. Physical Exam  Constitutional: She is oriented to person, place, and time. She appears well-developed.  HENT:  Head: Normocephalic.  Eyes: Pupils are equal, round, and reactive to light.  Neck: Normal range of motion.  Cardiovascular: Normal rate.   Respiratory: Effort normal.  GI: Soft.  Musculoskeletal:  On exam, moderate to severe distress, walks with antalgic gait. Straight leg raise with buttock, thigh, and calf pain on the right, negative on left. EHL is 4/5 on right compared to the left as is her quad strength. When you lift her leg and then let it down, she does have groin pain associated with it. It is different than her back  pain.  Neurological: She is alert and oriented to person, place, and time.  Skin: Skin is warm and dry.    Three-view radiographs, AP, lateral, flexion-extension demonstrate thoracolumbar scoliosis and severe facet arthrosis and lateral recess stenosis at 4-5 and at 5-1. Hips unremarkable.  MRI on December 30 demonstrates severe neural foraminal stenosis L4-5 on the right. Minimal listhesis of 5-1 on the right with foraminal stenosis L5-S1 on the right.  Disc herniation 2-3 to the left.  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  1. Given the persistent of her symptoms despite conservative treatment, presence of neurologic deficit, we discussed decompression at 4-5 with foraminotomy L4, probable hemilaminectomy at L5, partial medial hemifacectomy and decompressing the L5 root to L5-S1. We will take the removed bone and place it the lateral recess for possible lateral mass fusion. I had an extensive discussion of the risks and benefits of the lumbar decompression with the patient including bleeding, infection, damage to neurovascular structures, epidural fibrosis, CSF leak requiring repair. We also discussed increase in pain, adjacent segment disease, recurrent disc herniation, need for future surgery including repeat decompression and/or fusion. We also discussed risks of postoperative hematoma, paralysis, anesthetic complications including DVT, PE, death, cardiopulmonary dysfunction. In addition, the perioperative and postoperative courses were discussed in detail including the rehabilitative time and return to functional activity and work. I provided the patient with an illustrated handout and utilized the appropriate surgical models. 2. Would like to not stay overnight in the hospital. She does get nightmares. 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 She does have symptoms that are associated with that, but are clearly different than her intraspinal pain. 3. Tobacco cessation.  We talked about decompression on the right, Dr. Nelva Bush did an L5 selective nerve root block, which he felt was th distribution. I think she is having two nerve root distributions of L4 and L5 on the right where her scoliosis is. I  talked to her about a microlumbar decompression below level of spinal cord and the risks and benefits associated with that putting bone graft out into the lateral recess time overnight. She is still disabled by her symptoms, it keeps her from working. She is at home, medications have not worked, the therapy and the injections and we mutually agreed to proceed with that surgery after this further discussion. I have encouraged her that if there is any additional information to please call.  Plan microlumbar decompression L4-5, possible L5-S1 right, possible lateral mass fusion  BISSELL, Conley Rolls., PA-C for Dr. Tonita Cong 07/22/2016, 4:17 PM

## 2016-07-24 NOTE — Patient Instructions (Signed)
Jakeline Stiegler Perlmutter  07/24/2016   Your procedure is scheduled on: 08/05/2016    Report to [facility] Main  Entrance take [place]  elevators to 3rd floor to  [place] at   [place] AM.  Call this number if you have problems the morning of surgery [phone]   Remember: ONLY 1 PERSON MAY GO WITH YOU TO SHORT STAY TO GET  READY MORNING OF [place].  Do not eat food or drink liquids :After Midnight.     Take these medicines the morning of surgery with A SIP OF WATER: Duoneb if needed, Tramadol ( Ultram)if needed                                 You may not have any metal on your body including hair pins and              piercings  Do not wear jewelry, make-up, lotions, powders or perfumes, deodorant             Do not wear nail polish.  Do not shave  48 hours prior to surgery.     Do not bring valuables to the hospital. [place].  Contacts, dentures or bridgework may not be worn into surgery.  Leave suitcase in the car. After surgery it may be brought to your room.                     Please read over the following fact sheets you were given: _____________________________________________________________________             [facility] - Preparing for Surgery Before surgery, you can play an important role.  Because skin is not sterile, your skin needs to be as free of germs as possible.  You can reduce the number of germs on your skin by washing with CHG (chlorahexidine gluconate) soap before surgery.  CHG is an antiseptic cleaner which kills germs and bonds with the skin to continue killing germs even after washing. Please DO NOT use if you have an allergy to CHG or antibacterial soaps.  If your skin becomes reddened/irritated stop using the CHG and inform your nurse when you arrive at Short Stay. Do not shave (including legs and underarms) for at least 48 hours prior to the first CHG shower.  You may  shave your face/neck. Please follow these instructions carefully:  1.  Shower with CHG Soap the night before surgery and the  morning of Surgery.  2.  If you choose to wash your hair, wash your hair first as usual with your  normal  shampoo.  3.  After you shampoo, rinse your hair and body thoroughly to remove the  shampoo.                           4.  Use CHG as you would any other liquid soap.  You can apply chg directly  to the skin and wash                       Gently with a scrungie or clean washcloth.  5.  Apply the CHG Soap to your body  ONLY FROM THE NECK DOWN.   Do not use on face/ open                           Wound or open sores. Avoid contact with eyes, ears mouth and genitals (private parts).                       Wash face,  Genitals (private parts) with your normal soap.             6.  Wash thoroughly, paying special attention to the area where your surgery  will be performed.  7.  Thoroughly rinse your body with warm water from the neck down.  8.  DO NOT shower/wash with your normal soap after using and rinsing off  the CHG Soap.                9.  Pat yourself dry with a clean towel.            10.  Wear clean pajamas.            11.  Place clean sheets on your bed the night of your first shower and do not  sleep with pets. Day of Surgery : Do not apply any lotions/deodorants the morning of surgery.  Please wear clean clothes to the hospital/surgery center.  FAILURE TO FOLLOW THESE INSTRUCTIONS MAY RESULT IN THE CANCELLATION OF YOUR SURGERY PATIENT SIGNATURE_________________________________  NURSE SIGNATURE__________________________________  ________________________________________________________________________   Adam Phenix  An incentive spirometer is a tool that can help keep your lungs clear and active. This tool measures how well you are filling your lungs with each breath. Taking long deep breaths may help reverse or decrease the chance of developing  breathing (pulmonary) problems (especially infection) following:  A long period of time when you are unable to move or be active. BEFORE THE PROCEDURE   If the spirometer includes an indicator to show your best effort, your nurse or respiratory therapist will set it to a desired goal.  If possible, sit up straight or lean slightly forward. Try not to slouch.  Hold the incentive spirometer in an upright position. INSTRUCTIONS FOR USE  1. Sit on the edge of your bed if possible, or sit up as far as you can in bed or on a chair. 2. Hold the incentive spirometer in an upright position. 3. Breathe out normally. 4. Place the mouthpiece in your mouth and seal your lips tightly around it. 5. Breathe in slowly and as deeply as possible, raising the piston or the ball toward the top of the column. 6. Hold your breath for 3-5 seconds or for as long as possible. Allow the piston or ball to fall to the bottom of the column. 7. Remove the mouthpiece from your mouth and breathe out normally. 8. Rest for a few seconds and repeat Steps 1 through 7 at least 10 times every 1-2 hours when you are awake. Take your time and take a few normal breaths between deep breaths. 9. The spirometer may include an indicator to show your best effort. Use the indicator as a goal to work toward during each repetition. 10. After each set of 10 deep breaths, practice coughing to be sure your lungs are clear. If you have an incision (the cut made at the time of surgery), support your incision when coughing by placing a pillow or rolled up towels firmly against it. Once  you are able to get out of bed, walk around indoors and cough well. You may stop using the incentive spirometer when instructed by your caregiver.  RISKS AND COMPLICATIONS  Take your time so you do not get dizzy or light-headed.  If you are in pain, you may need to take or ask for pain medication before doing incentive spirometry. It is harder to take a deep  breath if you are having pain. AFTER USE  Rest and breathe slowly and easily.  It can be helpful to keep track of a log of your progress. Your caregiver can provide you with a simple table to help with this. If you are using the spirometer at home, follow these instructions: [place] IF:   You are having difficultly using the spirometer.  You have trouble using the spirometer as often as instructed.  Your pain medication is not giving enough relief while using the spirometer.  You develop fever of 100.5 F (38.1 C) or higher. SEEK IMMEDIATE MEDICAL CARE IF:   You cough up bloody sputum that had not been present before.  You develop fever of 102 F (38.9 C) or greater.  You develop worsening pain at or near the incision site. MAKE SURE YOU:   Understand these instructions.  Will watch your condition.  Will get help right away if you are not doing well or get worse. Document Released: 10/12/2006 Document Revised: 08/24/2011 Document Reviewed: 12/13/2006 [facility] Patient Information 2014 [place], [place].   ________________________________________________________________________

## 2016-07-27 ENCOUNTER — Encounter (HOSPITAL_COMMUNITY)
Admission: RE | Admit: 2016-07-27 | Discharge: 2016-07-27 | Disposition: A | Discharge status: Home / Self Care | Payer: Managed Care, Other (non HMO) | Source: Ambulatory Visit | Attending: Specialist | Admitting: Specialist

## 2016-07-27 ENCOUNTER — Encounter (HOSPITAL_COMMUNITY): Payer: Self-pay

## 2016-07-27 ENCOUNTER — Ambulatory Visit (HOSPITAL_COMMUNITY)
Admission: RE | Admit: 2016-07-27 | Discharge: 2016-07-27 | Disposition: A | Discharge status: Home / Self Care | Payer: Managed Care, Other (non HMO) | Source: Ambulatory Visit | Attending: Orthopedic Surgery | Admitting: Orthopedic Surgery

## 2016-07-27 ENCOUNTER — Encounter (INDEPENDENT_AMBULATORY_CARE_PROVIDER_SITE_OTHER): Payer: Self-pay

## 2016-07-27 DIAGNOSIS — M5137 Other intervertebral disc degeneration, lumbosacral region: Secondary | ICD-10-CM | POA: Insufficient documentation

## 2016-07-27 DIAGNOSIS — M5136 Other intervertebral disc degeneration, lumbar region: Secondary | ICD-10-CM | POA: Insufficient documentation

## 2016-07-27 DIAGNOSIS — M5126 Other intervertebral disc displacement, lumbar region: Secondary | ICD-10-CM | POA: Insufficient documentation

## 2016-07-27 HISTORY — DX: Gastro-esophageal reflux disease without esophagitis: K21.9

## 2016-07-27 LAB — BASIC METABOLIC PANEL
ANION GAP: 8 (ref 5–15)
BUN: 17 mg/dL (ref 6–20)
CHLORIDE: 102 mmol/L (ref 101–111)
CO2: 30 mmol/L (ref 22–32)
CREATININE: 0.82 mg/dL (ref 0.44–1.00)
Calcium: 9.9 mg/dL (ref 8.9–10.3)
GFR calc non Af Amer: >60 mL/min (ref >60)
GLUCOSE: 110 mg/dL — AB (ref 65–99)
Potassium: 4.3 mmol/L (ref 3.5–5.1)
Sodium: 140 mmol/L (ref 135–145)

## 2016-07-27 LAB — CBC
HCT: 43.3 % (ref 36.0–46.0)
HEMOGLOBIN: 14.9 g/dL (ref 12.0–15.0)
MCH: 30.6 pg (ref 26.0–34.0)
MCHC: 34.4 g/dL (ref 30.0–36.0)
MCV: 88.9 fL (ref 78.0–100.0)
Platelets: 251 10*3/uL (ref 150–400)
RBC: 4.87 MIL/uL (ref 3.87–5.11)
RDW: 13.3 % (ref 11.5–15.5)
WBC: 8.1 10*3/uL (ref 4.0–10.5)

## 2016-07-27 LAB — SURGICAL PCR SCREEN
MRSA, PCR: NEGATIVE
Staphylococcus aureus: NEGATIVE

## 2016-08-04 ENCOUNTER — Ambulatory Visit: Payer: Self-pay | Admitting: Orthopedic Surgery

## 2016-08-14 NOTE — Progress Notes (Signed)
Need orders in epic for 3-16 surgery pre op is 3-6

## 2016-08-18 ENCOUNTER — Encounter (HOSPITAL_COMMUNITY)
Admission: RE | Admit: 2016-08-18 | Discharge: 2016-08-18 | Disposition: A | Discharge status: Home / Self Care | Payer: Managed Care, Other (non HMO) | Source: Ambulatory Visit | Attending: Specialist | Admitting: Specialist

## 2016-08-18 ENCOUNTER — Ambulatory Visit: Payer: Self-pay | Admitting: Orthopedic Surgery

## 2016-08-18 ENCOUNTER — Encounter (HOSPITAL_COMMUNITY): Payer: Self-pay

## 2016-08-18 DIAGNOSIS — M48061 Spinal stenosis, lumbar region without neurogenic claudication: Secondary | ICD-10-CM | POA: Insufficient documentation

## 2016-08-18 DIAGNOSIS — Z01818 Encounter for other preprocedural examination: Secondary | ICD-10-CM | POA: Diagnosis not present

## 2016-08-18 HISTORY — DX: Myoneural disorder, unspecified: G70.9

## 2016-08-18 LAB — BASIC METABOLIC PANEL
Anion gap: 7 (ref 5–15)
BUN: 11 mg/dL (ref 6–20)
CHLORIDE: 102 mmol/L (ref 101–111)
CO2: 28 mmol/L (ref 22–32)
CREATININE: 0.8 mg/dL (ref 0.44–1.00)
Calcium: 9.5 mg/dL (ref 8.9–10.3)
GFR calc Af Amer: >60 mL/min (ref >60)
GFR calc non Af Amer: >60 mL/min (ref >60)
GLUCOSE: 110 mg/dL — AB (ref 65–99)
Potassium: 4.1 mmol/L (ref 3.5–5.1)
Sodium: 137 mmol/L (ref 135–145)

## 2016-08-18 LAB — CBC
HCT: 46.5 % — ABNORMAL HIGH (ref 36.0–46.0)
Hemoglobin: 15.2 g/dL — ABNORMAL HIGH (ref 12.0–15.0)
MCH: 29.7 pg (ref 26.0–34.0)
MCHC: 32.7 g/dL (ref 30.0–36.0)
MCV: 90.8 fL (ref 78.0–100.0)
PLATELETS: 239 10*3/uL (ref 150–400)
RBC: 5.12 MIL/uL — ABNORMAL HIGH (ref 3.87–5.11)
RDW: 13.3 % (ref 11.5–15.5)
WBC: 10.4 10*3/uL (ref 4.0–10.5)

## 2016-08-18 LAB — SURGICAL PCR SCREEN
MRSA, PCR: NEGATIVE
Staphylococcus aureus: NEGATIVE

## 2016-08-18 NOTE — Patient Instructions (Signed)
Abrie Thurmon Shimkus  08/18/2016   Your procedure is scheduled on: 08/26/16  Report to [facility] Main  Entrance take [place]  elevators to 3rd floor to  [place] at     [phone].  Call this number if you have problems the morning of surgery [phone]   Remember: ONLY 1 PERSON MAY GO WITH YOU TO SHORT STAY TO GET  READY MORNING OF [place].  Do not eat food or drink liquids :After Midnight.     Take these medicines the morning of surgery with A SIP OF WATER:                                 You may not have any metal on your body including hair pins and              piercings  Do not wear jewelry, make-up, lotions, powders or perfumes, deodorant             Do not wear nail polish.  Do not shave  48 hours prior to surgery.              Men may shave face and neck.   Do not bring valuables to the hospital. [place].  Contacts, dentures or bridgework may not be worn into surgery.  Leave suitcase in the car. After surgery it may be brought to your room.                Please read over the following fact sheets you were given: _____________________________________________________________________             [facility] - Preparing for Surgery Before surgery, you can play an important role.  Because skin is not sterile, your skin needs to be as free of germs as possible.  You can reduce the number of germs on your skin by washing with CHG (chlorahexidine gluconate) soap before surgery.  CHG is an antiseptic cleaner which kills germs and bonds with the skin to continue killing germs even after washing. Please DO NOT use if you have an allergy to CHG or antibacterial soaps.  If your skin becomes reddened/irritated stop using the CHG and inform your nurse when you arrive at Short Stay. Do not shave (including legs and underarms) for at least 48 hours prior to the first CHG shower.  You may shave your  face/neck. Please follow these instructions carefully:  1.  Shower with CHG Soap the night before surgery and the  morning of Surgery.  2.  If you choose to wash your hair, wash your hair first as usual with your  normal  shampoo.  3.  After you shampoo, rinse your hair and body thoroughly to remove the  shampoo.                           4.  Use CHG as you would any other liquid soap.  You can apply chg directly  to the skin and wash                       Gently with a scrungie or clean washcloth.  5.  Apply the CHG  Soap to your body ONLY FROM THE NECK DOWN.   Do not use on face/ open                           Wound or open sores. Avoid contact with eyes, ears mouth and genitals (private parts).                       Wash face,  Genitals (private parts) with your normal soap.             6.  Wash thoroughly, paying special attention to the area where your surgery  will be performed.  7.  Thoroughly rinse your body with warm water from the neck down.  8.  DO NOT shower/wash with your normal soap after using and rinsing off  the CHG Soap.                9.  Pat yourself dry with a clean towel.            10.  Wear clean pajamas.            11.  Place clean sheets on your bed the night of your first shower and do not  sleep with pets. Day of Surgery : Do not apply any lotions/deodorants the morning of surgery.  Please wear clean clothes to the hospital/surgery center.  FAILURE TO FOLLOW THESE INSTRUCTIONS MAY RESULT IN THE CANCELLATION OF YOUR SURGERY PATIENT SIGNATURE_________________________________  NURSE SIGNATURE__________________________________  ________________________________________________________________________   Adam Phenix  An incentive spirometer is a tool that can help keep your lungs clear and active. This tool measures how well you are filling your lungs with each breath. Taking long deep breaths may help reverse or decrease the chance of developing breathing  (pulmonary) problems (especially infection) following:  A long period of time when you are unable to move or be active. BEFORE THE PROCEDURE   If the spirometer includes an indicator to show your best effort, your nurse or respiratory therapist will set it to a desired goal.  If possible, sit up straight or lean slightly forward. Try not to slouch.  Hold the incentive spirometer in an upright position. INSTRUCTIONS FOR USE  1. Sit on the edge of your bed if possible, or sit up as far as you can in bed or on a chair. 2. Hold the incentive spirometer in an upright position. 3. Breathe out normally. 4. Place the mouthpiece in your mouth and seal your lips tightly around it. 5. Breathe in slowly and as deeply as possible, raising the piston or the ball toward the top of the column. 6. Hold your breath for 3-5 seconds or for as long as possible. Allow the piston or ball to fall to the bottom of the column. 7. Remove the mouthpiece from your mouth and breathe out normally. 8. Rest for a few seconds and repeat Steps 1 through 7 at least 10 times every 1-2 hours when you are awake. Take your time and take a few normal breaths between deep breaths. 9. The spirometer may include an indicator to show your best effort. Use the indicator as a goal to work toward during each repetition. 10. After each set of 10 deep breaths, practice coughing to be sure your lungs are clear. If you have an incision (the cut made at the time of surgery), support your incision when coughing by placing a pillow or rolled up towels  firmly against it. Once you are able to get out of bed, walk around indoors and cough well. You may stop using the incentive spirometer when instructed by your caregiver.  RISKS AND COMPLICATIONS  Take your time so you do not get dizzy or light-headed.  If you are in pain, you may need to take or ask for pain medication before doing incentive spirometry. It is harder to take a deep breath if you  are having pain. AFTER USE  Rest and breathe slowly and easily.  It can be helpful to keep track of a log of your progress. Your caregiver can provide you with a simple table to help with this. If you are using the spirometer at home, follow these instructions: [place] IF:   You are having difficultly using the spirometer.  You have trouble using the spirometer as often as instructed.  Your pain medication is not giving enough relief while using the spirometer.  You develop fever of 100.5 F (38.1 C) or higher. SEEK IMMEDIATE MEDICAL CARE IF:   You cough up bloody sputum that had not been present before.  You develop fever of 102 F (38.9 C) or greater.  You develop worsening pain at or near the incision site. MAKE SURE YOU:   Understand these instructions.  Will watch your condition.  Will get help right away if you are not doing well or get worse. Document Released: 10/12/2006 Document Revised: 08/24/2011 Document Reviewed: 12/13/2006 [facility] Patient Information 2014 [place], [place].   ________________________________________________________________________

## 2016-08-18 NOTE — H&P (Signed)
Debra Farmer is an 70 y.o. female.   Chief Complaint: back and R leg pain HPI: The patient is a 70 year old female who presents today for follow up of their back. The patient is being followed for their low back symptoms. They are now 5 1/2 months out from when symptoms began. Symptoms reported today include: leg pain (right). The patient states that they are doing poorly. Current treatment includes: relative rest, activity modification and pain medications. The following medication has been used for pain control: tramadol (during the day) and Norco (at night). The patient presents today following right L5 SNRB. The patient reports the injection helped for 2 days.  Debra Farmer follows up. She had about a week or so relief from the injection Dr. Ramos gave her L5 selective nerve root block. Now, it has returned and radiates down into buttock, thigh, and calf, top and medial aspect of the foot.  Review of systems is negative for fevers, chest pain, shortness of breath, unexplained recent weight loss, loss of bowel or bladder function, burning with urination, joint swelling, rashes, weakness or numbness, difficulty with balance, easy bruising, excessive thirst or frequent urination.      Past Medical History:  Diagnosis Date  . Arthritis   . COPD (chronic obstructive pulmonary disease) ([facility])          Past Surgical History:  Procedure Laterality Date  . ABDOMINAL HYSTERECTOMY     partial  . APPENDECTOMY    . CHOLECYSTECTOMY    . KNEE ARTHROSCOPY  1999   Dr Collins -- R knee          Family History  Problem Relation Age of Onset  . Heart disease Mother 80    CHF  . COPD Father 80    emphesema   Social History:  reports that she has been smoking Cigarettes.  She has been smoking about 0.50 packs per day. She has never used smokeless tobacco. She reports that she does not drink alcohol or use drugs.  Allergies:       Allergies  Allergen Reactions  .  Aspirin     REACTION: upset stomach  . Codeine Sulfate Other (See Comments)    "drugged out feeling" & hallucinations.  . Lipitor [Atorvastatin]     cramps  . Nabumetone Other (See Comments)    Unsure of reaction (reaction occurred sometime ago)     (Not in a hospital admission)  LabResultsLast48Hours  No results found for this or any previous visit (from the past 48 hour(s)).   ImagingResults(Last48hours)  No results found.    Review of Systems  Constitutional: Negative.   HENT: Negative.   Eyes: Negative.   Respiratory: Negative.   Cardiovascular: Negative.   Gastrointestinal: Negative.   Genitourinary: Negative.   Musculoskeletal: Positive for back pain.  Skin: Negative.   Neurological: Positive for sensory change and focal weakness.  Psychiatric/Behavioral: Negative.     There were no vitals taken for this visit. Physical Exam  Constitutional: She is oriented to person, place, and time. She appears well-developed.  HENT:  Head: Normocephalic.  Eyes: Pupils are equal, round, and reactive to light.  Neck: Normal range of motion.  Cardiovascular: Normal rate.   Respiratory: Effort normal.  GI: Soft.  Musculoskeletal:  On exam, moderate to severe distress, walks with antalgic gait. Straight leg raise with buttock, thigh, and calf pain on the right, negative on left. EHL is 4/5 on right compared to the left as is her quad   strength. When you lift her leg and then let it down, she does have groin pain associated with it. It is different than her back pain.  Neurological: She is alert and oriented to person, place, and time.  Skin: Skin is warm and dry.    Three-view radiographs, AP, lateral, flexion-extension demonstrate thoracolumbar scoliosis and severe facet arthrosis and lateral recess stenosis at 4-5 and at 5-1. Hips unremarkable.  MRI on December 30 demonstrates severe neural foraminal stenosis L4-5 on the right. Minimal listhesis of 5-1  on the right with foraminal stenosis L5-S1 on the right.  Disc herniation 2-3 to the left.  Assessment/Plan L4-L5 radiculopathy secondary to severe facet arthropathy and lateral recess stenosis, neuroforaminal stenosis at 4-5 and extended to L5 root down to L5-S1 refractory to rest, activity modification, injections, and therapy.  1. Given the persistent of her symptoms despite conservative treatment, presence of neurologic deficit, we discussed decompression at 4-5 with foraminotomy L4, probable hemilaminectomy at L5, partial medial hemifacectomy and decompressing the L5 root to L5-S1. We will take the removed bone and place it the lateral recess for possible lateral mass fusion. I had an extensive discussion of the risks and benefits of the lumbar decompression with the patient including bleeding, infection, damage to neurovascular structures, epidural fibrosis, CSF leak requiring repair. We also discussed increase in pain, adjacent segment disease, recurrent disc herniation, need for future surgery including repeat decompression and/or fusion. We also discussed risks of postoperative hematoma, paralysis, anesthetic complications including DVT, PE, death, cardiopulmonary dysfunction. In addition, the perioperative and postoperative courses were discussed in detail including the rehabilitative time and return to functional activity and work. I provided the patient with an illustrated handout and utilized the appropriate surgical models. 2. Would like to not stay overnight in the hospital. She does get nightmares. 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 She does have symptoms that are associated with that, but are clearly different than her intraspinal pain. 3. Tobacco cessation.  We talked about decompression on the right, Dr. Ramos did an L5 selective nerve root block, which he felt was  th distribution. I think she is having two nerve root distributions of L4 and L5 on the right where her scoliosis is. I talked to her about a microlumbar decompression below level of spinal cord and the risks and benefits associated with that putting bone graft out into the lateral recess time overnight. She is still disabled by her symptoms, it keeps her from working. She is at home, medications have not worked, the therapy and the injections and we mutually agreed to proceed with that surgery after this further discussion. I have encouraged her that if there is any additional information to please call.  Plan microlumbar decompression L4-5, possible L5-S1 right, possible lateral mass fusion  Donavyn Fecher M., PA-C for Dr. Beane  

## 2016-08-18 NOTE — Progress Notes (Signed)
Spoke with Judeen Hammans ( scheduler ) at office on 08/17/2016 and she stated per [place], [place] did not have to be repeated on today's preop appointment.

## 2016-08-19 ENCOUNTER — Other Ambulatory Visit: Payer: Self-pay | Admitting: Internal Medicine

## 2016-08-21 NOTE — Telephone Encounter (Signed)
Called refill into pharmacy spoke w/Shelly gave MD approval.../lmb

## 2016-08-26 ENCOUNTER — Ambulatory Visit (HOSPITAL_COMMUNITY): Payer: Managed Care, Other (non HMO) | Admitting: Anesthesiology

## 2016-08-26 ENCOUNTER — Ambulatory Visit (HOSPITAL_COMMUNITY): Payer: Managed Care, Other (non HMO)

## 2016-08-26 ENCOUNTER — Encounter (HOSPITAL_COMMUNITY): Payer: Self-pay | Admitting: Anesthesiology

## 2016-08-26 ENCOUNTER — Ambulatory Visit (HOSPITAL_COMMUNITY)
Admission: RE | Admit: 2016-08-26 | Discharge: 2016-08-26 | Disposition: A | Discharge status: Home / Self Care | Payer: Managed Care, Other (non HMO) | Source: Ambulatory Visit | Attending: Specialist | Admitting: Specialist

## 2016-08-26 ENCOUNTER — Encounter (HOSPITAL_COMMUNITY)
Admission: RE | Disposition: A | Discharge status: Home / Self Care | Payer: Self-pay | Source: Ambulatory Visit | Attending: Specialist

## 2016-08-26 DIAGNOSIS — M48062 Spinal stenosis, lumbar region with neurogenic claudication: Secondary | ICD-10-CM | POA: Diagnosis not present

## 2016-08-26 DIAGNOSIS — F17210 Nicotine dependence, cigarettes, uncomplicated: Secondary | ICD-10-CM | POA: Diagnosis not present

## 2016-08-26 DIAGNOSIS — J449 Chronic obstructive pulmonary disease, unspecified: Secondary | ICD-10-CM | POA: Diagnosis not present

## 2016-08-26 DIAGNOSIS — Z836 Family history of other diseases of the respiratory system: Secondary | ICD-10-CM | POA: Diagnosis not present

## 2016-08-26 DIAGNOSIS — Z79899 Other long term (current) drug therapy: Secondary | ICD-10-CM | POA: Insufficient documentation

## 2016-08-26 DIAGNOSIS — M419 Scoliosis, unspecified: Secondary | ICD-10-CM | POA: Insufficient documentation

## 2016-08-26 DIAGNOSIS — Z7952 Long term (current) use of systemic steroids: Secondary | ICD-10-CM | POA: Insufficient documentation

## 2016-08-26 DIAGNOSIS — M4807 Spinal stenosis, lumbosacral region: Secondary | ICD-10-CM | POA: Insufficient documentation

## 2016-08-26 DIAGNOSIS — R2681 Unsteadiness on feet: Secondary | ICD-10-CM | POA: Diagnosis not present

## 2016-08-26 DIAGNOSIS — K219 Gastro-esophageal reflux disease without esophagitis: Secondary | ICD-10-CM | POA: Insufficient documentation

## 2016-08-26 DIAGNOSIS — M5416 Radiculopathy, lumbar region: Secondary | ICD-10-CM | POA: Insufficient documentation

## 2016-08-26 DIAGNOSIS — Z419 Encounter for procedure for purposes other than remedying health state, unspecified: Secondary | ICD-10-CM

## 2016-08-26 DIAGNOSIS — M48061 Spinal stenosis, lumbar region without neurogenic claudication: Secondary | ICD-10-CM | POA: Diagnosis present

## 2016-08-26 DIAGNOSIS — M1990 Unspecified osteoarthritis, unspecified site: Secondary | ICD-10-CM | POA: Diagnosis not present

## 2016-08-26 DIAGNOSIS — M4306 Spondylolysis, lumbar region: Secondary | ICD-10-CM | POA: Diagnosis not present

## 2016-08-26 HISTORY — PX: LUMBAR LAMINECTOMY/DECOMPRESSION MICRODISCECTOMY: SHX5026

## 2016-08-26 SURGERY — LUMBAR LAMINECTOMY/DECOMPRESSION MICRODISCECTOMY 2 LEVELS
Anesthesia: General | Site: Back | Laterality: Right

## 2016-08-26 MED ORDER — DOCUSATE SODIUM 100 MG PO CAPS: 100.0000 mg | ORAL_CAPSULE | Freq: Two times a day (BID) | ORAL | 1 refills | Status: DC | PRN

## 2016-08-26 MED ORDER — LIDOCAINE 2% (20 MG/ML) 5 ML SYRINGE
INTRAMUSCULAR | Status: DC | PRN
  Administered 2016-08-26: 80 mg via INTRAVENOUS

## 2016-08-26 MED ORDER — PROPOFOL 10 MG/ML IV BOLUS
INTRAVENOUS | Status: DC | PRN
  Administered 2016-08-26: 200 mg via INTRAVENOUS

## 2016-08-26 MED ORDER — ALUM & MAG HYDROXIDE-SIMETH 200-200-20 MG/5ML PO SUSP: 30.0000 mL | Freq: Four times a day (QID) | ORAL | Status: DC | PRN

## 2016-08-26 MED ORDER — POLYETHYLENE GLYCOL 3350 17 G PO PACK: 17.0000 g | PACK | Freq: Every day | ORAL | Status: DC | PRN

## 2016-08-26 MED ORDER — METHOCARBAMOL 500 MG PO TABS: 500.0000 mg | ORAL_TABLET | Freq: Four times a day (QID) | ORAL | Status: DC | PRN

## 2016-08-26 MED ORDER — SUGAMMADEX SODIUM 200 MG/2ML IV SOLN
INTRAVENOUS | Status: DC | PRN
  Administered 2016-08-26: 200 mg via INTRAVENOUS

## 2016-08-26 MED ORDER — VITAMIN C 500 MG PO TABS: 1000.0000 mg | ORAL_TABLET | Freq: Every day | ORAL | Status: DC

## 2016-08-26 MED ORDER — PREDNISONE 1 MG PO TABS: 4.0000 mg | ORAL_TABLET | Freq: Every day | ORAL | Status: DC

## 2016-08-26 MED ORDER — ACETAMINOPHEN 650 MG RE SUPP: 650.0000 mg | RECTAL | Status: DC | PRN

## 2016-08-26 MED ORDER — CEFAZOLIN SODIUM-DEXTROSE 2-4 GM/100ML-% IV SOLN
2.0000 g | Freq: Three times a day (TID) | INTRAVENOUS | Status: DC
  Administered 2016-08-26: 17:00:00 2 g via INTRAVENOUS
  Filled 2016-08-26: qty 100

## 2016-08-26 MED ORDER — POLYETHYLENE GLYCOL 3350 17 G PO PACK: 17.0000 g | PACK | Freq: Every day | ORAL | 0 refills | Status: DC

## 2016-08-26 MED ORDER — GABAPENTIN 100 MG PO CAPS
100.0000 mg | ORAL_CAPSULE | Freq: Three times a day (TID) | ORAL | Status: DC | PRN
Start: 2016-08-26 — End: 2016-08-26

## 2016-08-26 MED ORDER — MENTHOL 3 MG MT LOZG: 1.0000 | LOZENGE | OROMUCOSAL | Status: DC | PRN

## 2016-08-26 MED ORDER — METHOCARBAMOL 1000 MG/10ML IJ SOLN
500.0000 mg | Freq: Four times a day (QID) | INTRAVENOUS | Status: DC | PRN
  Administered 2016-08-26: 500 mg via INTRAVENOUS
  Filled 2016-08-26: qty 550
  Filled 2016-08-26: qty 5

## 2016-08-26 MED ORDER — BUPIVACAINE-EPINEPHRINE (PF) 0.5% -1:200000 IJ SOLN
INTRAMUSCULAR | Status: DC | PRN
  Administered 2016-08-26: 20 mL

## 2016-08-26 MED ORDER — LACTATED RINGERS IV SOLN
INTRAVENOUS | Status: DC | PRN
  Administered 2016-08-26 (×2): via INTRAVENOUS

## 2016-08-26 MED ORDER — ROCURONIUM BROMIDE 50 MG/5ML IV SOSY
PREFILLED_SYRINGE | INTRAVENOUS | Status: AC
Start: 2016-08-26 — End: 2016-08-26
  Filled 2016-08-26: qty 5

## 2016-08-26 MED ORDER — BISACODYL 5 MG PO TBEC: 5.0000 mg | DELAYED_RELEASE_TABLET | Freq: Every day | ORAL | Status: DC | PRN

## 2016-08-26 MED ORDER — HYDROCODONE-ACETAMINOPHEN 5-325 MG PO TABS: 1.0000 | ORAL_TABLET | ORAL | 0 refills | Status: DC | PRN

## 2016-08-26 MED ORDER — CEFAZOLIN SODIUM-DEXTROSE 2-4 GM/100ML-% IV SOLN
INTRAVENOUS | Status: AC
  Filled 2016-08-26: qty 100

## 2016-08-26 MED ORDER — ROCURONIUM BROMIDE 10 MG/ML (PF) SYRINGE
PREFILLED_SYRINGE | INTRAVENOUS | Status: DC | PRN
  Administered 2016-08-26 (×2): 5 mg via INTRAVENOUS
  Administered 2016-08-26: 35 mg via INTRAVENOUS

## 2016-08-26 MED ORDER — FUROSEMIDE 20 MG PO TABS: 20.0000 mg | ORAL_TABLET | Freq: Every day | ORAL | Status: DC | PRN

## 2016-08-26 MED ORDER — LACTATED RINGERS IV SOLN: INTRAVENOUS | Status: DC

## 2016-08-26 MED ORDER — ACETAMINOPHEN 325 MG PO TABS: 650.0000 mg | ORAL_TABLET | ORAL | Status: DC | PRN

## 2016-08-26 MED ORDER — THROMBIN 5000 UNITS EX SOLR
CUTANEOUS | Status: DC | PRN
  Administered 2016-08-26: 10000 [IU] via TOPICAL

## 2016-08-26 MED ORDER — SODIUM CHLORIDE 0.9 % IR SOLN
Status: AC
  Filled 2016-08-26: qty 500000

## 2016-08-26 MED ORDER — THROMBIN 5000 UNITS EX SOLR
CUTANEOUS | Status: AC
  Filled 2016-08-26: qty 10000

## 2016-08-26 MED ORDER — DEXAMETHASONE SODIUM PHOSPHATE 10 MG/ML IJ SOLN
INTRAMUSCULAR | Status: AC
  Filled 2016-08-26: qty 1

## 2016-08-26 MED ORDER — HYDROMORPHONE HCL 1 MG/ML IJ SOLN
0.2500 mg | INTRAMUSCULAR | Status: DC | PRN
  Administered 2016-08-26 (×4): 0.5 mg via INTRAVENOUS

## 2016-08-26 MED ORDER — ONDANSETRON HCL 4 MG PO TABS: 4.0000 mg | ORAL_TABLET | Freq: Four times a day (QID) | ORAL | Status: DC | PRN

## 2016-08-26 MED ORDER — KCL IN DEXTROSE-NACL 20-5-0.45 MEQ/L-%-% IV SOLN
INTRAVENOUS | Status: DC
  Administered 2016-08-26: 14:00:00 via INTRAVENOUS
  Filled 2016-08-26 (×2): qty 1000

## 2016-08-26 MED ORDER — HYDROMORPHONE HCL 1 MG/ML IJ SOLN: 0.5000 mg | INTRAMUSCULAR | Status: DC | PRN

## 2016-08-26 MED ORDER — SODIUM CHLORIDE 0.9 % IJ SOLN
INTRAMUSCULAR | Status: AC
  Filled 2016-08-26: qty 10

## 2016-08-26 MED ORDER — MAGNESIUM CITRATE PO SOLN: 1.0000 | Freq: Once | ORAL | Status: DC | PRN

## 2016-08-26 MED ORDER — ONDANSETRON HCL 4 MG/2ML IJ SOLN
INTRAMUSCULAR | Status: AC
  Filled 2016-08-26: qty 2

## 2016-08-26 MED ORDER — PROMETHAZINE HCL 25 MG/ML IJ SOLN: 6.2500 mg | INTRAMUSCULAR | Status: DC | PRN

## 2016-08-26 MED ORDER — ALBUTEROL SULFATE HFA 108 (90 BASE) MCG/ACT IN AERS
INHALATION_SPRAY | RESPIRATORY_TRACT | Status: AC
  Filled 2016-08-26: qty 6.7

## 2016-08-26 MED ORDER — LIDOCAINE 2% (20 MG/ML) 5 ML SYRINGE
INTRAMUSCULAR | Status: AC
  Filled 2016-08-26: qty 5

## 2016-08-26 MED ORDER — EPHEDRINE SULFATE-NACL 50-0.9 MG/10ML-% IV SOSY
PREFILLED_SYRINGE | INTRAVENOUS | Status: DC | PRN
  Administered 2016-08-26: 5 mg via INTRAVENOUS

## 2016-08-26 MED ORDER — RISAQUAD PO CAPS
1.0000 | ORAL_CAPSULE | Freq: Every day | ORAL | Status: DC
  Administered 2016-08-26: 1 via ORAL
  Filled 2016-08-26: qty 1

## 2016-08-26 MED ORDER — HYDROMORPHONE HCL 1 MG/ML IJ SOLN
INTRAMUSCULAR | Status: AC
  Filled 2016-08-26: qty 1

## 2016-08-26 MED ORDER — PROPOFOL 10 MG/ML IV BOLUS
INTRAVENOUS | Status: AC
  Filled 2016-08-26: qty 40

## 2016-08-26 MED ORDER — CEFAZOLIN SODIUM-DEXTROSE 2-4 GM/100ML-% IV SOLN
2.0000 g | INTRAVENOUS | Status: AC
  Administered 2016-08-26: 2 g via INTRAVENOUS

## 2016-08-26 MED ORDER — PHENYLEPHRINE 40 MCG/ML (10ML) SYRINGE FOR IV PUSH (FOR BLOOD PRESSURE SUPPORT)
PREFILLED_SYRINGE | INTRAVENOUS | Status: AC
  Filled 2016-08-26: qty 10

## 2016-08-26 MED ORDER — SUFENTANIL CITRATE 50 MCG/ML IV SOLN
INTRAVENOUS | Status: DC | PRN
  Administered 2016-08-26 (×3): 5 ug via INTRAVENOUS

## 2016-08-26 MED ORDER — HYDROCODONE-ACETAMINOPHEN 5-325 MG PO TABS: 1.0000 | ORAL_TABLET | ORAL | Status: DC | PRN

## 2016-08-26 MED ORDER — ONDANSETRON HCL 4 MG/2ML IJ SOLN: 4.0000 mg | Freq: Four times a day (QID) | INTRAMUSCULAR | Status: DC | PRN

## 2016-08-26 MED ORDER — PHENOL 1.4 % MT LIQD: 1.0000 | OROMUCOSAL | Status: DC | PRN

## 2016-08-26 MED ORDER — ZOLPIDEM TARTRATE 5 MG PO TABS: 5.0000 mg | ORAL_TABLET | Freq: Every evening | ORAL | Status: DC | PRN

## 2016-08-26 MED ORDER — ONDANSETRON HCL 4 MG/2ML IJ SOLN
INTRAMUSCULAR | Status: DC | PRN
  Administered 2016-08-26: 4 mg via INTRAVENOUS

## 2016-08-26 MED ORDER — ROCURONIUM BROMIDE 50 MG/5ML IV SOSY
PREFILLED_SYRINGE | INTRAVENOUS | Status: AC
  Filled 2016-08-26: qty 5

## 2016-08-26 MED ORDER — ACETAMINOPHEN ER 650 MG PO TBCR: 650.0000 mg | EXTENDED_RELEASE_TABLET | Freq: Three times a day (TID) | ORAL | Status: DC | PRN

## 2016-08-26 MED ORDER — BUPIVACAINE-EPINEPHRINE (PF) 0.5% -1:200000 IJ SOLN
INTRAMUSCULAR | Status: AC
  Filled 2016-08-26: qty 30

## 2016-08-26 MED ORDER — SUFENTANIL CITRATE 50 MCG/ML IV SOLN
INTRAVENOUS | Status: AC
  Filled 2016-08-26: qty 1

## 2016-08-26 MED ORDER — SODIUM CHLORIDE 0.9 % IR SOLN
Status: DC | PRN
  Administered 2016-08-26: 500 mL

## 2016-08-26 MED ORDER — DOCUSATE SODIUM 100 MG PO CAPS: 100.0000 mg | ORAL_CAPSULE | Freq: Two times a day (BID) | ORAL | Status: DC

## 2016-08-26 MED ORDER — ALBUTEROL SULFATE HFA 108 (90 BASE) MCG/ACT IN AERS
INHALATION_SPRAY | RESPIRATORY_TRACT | Status: DC | PRN
  Administered 2016-08-26: 3 via RESPIRATORY_TRACT

## 2016-08-26 MED ORDER — IPRATROPIUM-ALBUTEROL 0.5-2.5 (3) MG/3ML IN SOLN: 3.0000 mL | RESPIRATORY_TRACT | Status: DC | PRN

## 2016-08-26 MED ORDER — DEXAMETHASONE SODIUM PHOSPHATE 10 MG/ML IJ SOLN
INTRAMUSCULAR | Status: DC | PRN
  Administered 2016-08-26: 10 mg via INTRAVENOUS

## 2016-08-26 MED ORDER — PHENYLEPHRINE 40 MCG/ML (10ML) SYRINGE FOR IV PUSH (FOR BLOOD PRESSURE SUPPORT)
PREFILLED_SYRINGE | INTRAVENOUS | Status: DC | PRN
  Administered 2016-08-26 (×4): 80 ug via INTRAVENOUS

## 2016-08-26 MED ORDER — TRAMADOL HCL 50 MG PO TABS: 50.0000 mg | ORAL_TABLET | Freq: Four times a day (QID) | ORAL | Status: DC | PRN

## 2016-08-26 SURGICAL SUPPLY — 52 items
AGENT HMST SPONGE THK3/8 (HEMOSTASIS)
BAG SPEC THK2 15X12 ZIP CLS (MISCELLANEOUS)
BAG ZIPLOCK 12X15 (MISCELLANEOUS) IMPLANT
CLEANER TIP ELECTROSURG 2X2 (MISCELLANEOUS) ×3 IMPLANT
CLOSURE WOUND 1/2 X4 (GAUZE/BANDAGES/DRESSINGS) ×1
CLOTH 2% CHLOROHEXIDINE 3PK (PERSONAL CARE ITEMS) ×3 IMPLANT
DRAPE MICROSCOPE LEICA (MISCELLANEOUS) ×3 IMPLANT
DRAPE SHEET LG 3/4 BI-LAMINATE (DRAPES) IMPLANT
DRAPE SURG 17X11 SM STRL (DRAPES) ×3 IMPLANT
DRAPE UTILITY XL STRL (DRAPES) ×3 IMPLANT
DRSG AQUACEL AG ADV 3.5X 4 (GAUZE/BANDAGES/DRESSINGS) IMPLANT
DRSG AQUACEL AG ADV 3.5X 6 (GAUZE/BANDAGES/DRESSINGS) ×2 IMPLANT
DURAPREP 26ML APPLICATOR (WOUND CARE) ×3 IMPLANT
DURASEAL SPINE SEALANT 3ML (MISCELLANEOUS) IMPLANT
ELECT BLADE TIP CTD 4 INCH (ELECTRODE) IMPLANT
ELECT REM PT RETURN 9FT ADLT (ELECTROSURGICAL) ×3
ELECTRODE REM PT RTRN 9FT ADLT (ELECTROSURGICAL) ×1 IMPLANT
GLOVE BIOGEL PI IND STRL 7.0 (GLOVE) ×1 IMPLANT
GLOVE BIOGEL PI INDICATOR 7.0 (GLOVE) ×2
GLOVE SURG SS PI 7.0 STRL IVOR (GLOVE) ×3 IMPLANT
GLOVE SURG SS PI 8.0 STRL IVOR (GLOVE) ×6 IMPLANT
GOWN STRL REUS W/TWL XL LVL3 (GOWN DISPOSABLE) ×6 IMPLANT
HEMOSTAT SPONGE AVITENE ULTRA (HEMOSTASIS) IMPLANT
IV CATH 14GX2 1/4 (CATHETERS) IMPLANT
KIT BASIN OR (CUSTOM PROCEDURE TRAY) ×3 IMPLANT
KIT POSITIONING SURG ANDREWS (MISCELLANEOUS) ×3 IMPLANT
MANIFOLD NEPTUNE II (INSTRUMENTS) ×3 IMPLANT
MARKER SKIN DUAL TIP RULER LAB (MISCELLANEOUS) ×3 IMPLANT
NDL SPNL 18GX3.5 QUINCKE PK (NEEDLE) ×2 IMPLANT
NEEDLE SPNL 18GX3.5 QUINCKE PK (NEEDLE) ×6 IMPLANT
PACK LAMINECTOMY ORTHO (CUSTOM PROCEDURE TRAY) ×3 IMPLANT
PATTIES SURGICAL .5 X.5 (GAUZE/BANDAGES/DRESSINGS) ×3 IMPLANT
PATTIES SURGICAL .75X.75 (GAUZE/BANDAGES/DRESSINGS) ×3 IMPLANT
PATTIES SURGICAL 1X1 (DISPOSABLE) IMPLANT
RUBBERBAND STERILE (MISCELLANEOUS) ×3 IMPLANT
SPONGE LAP 4X18 X RAY DECT (DISPOSABLE) IMPLANT
SPONGE SURGIFOAM ABS GEL 100 (HEMOSTASIS) ×3 IMPLANT
STAPLER VISISTAT (STAPLE) IMPLANT
STRIP CLOSURE SKIN 1/2X4 (GAUZE/BANDAGES/DRESSINGS) ×1 IMPLANT
SUT NURALON 4 0 TR CR/8 (SUTURE) IMPLANT
SUT PROLENE 3 0 PS 2 (SUTURE) IMPLANT
SUT VIC AB 1 CT1 27 (SUTURE)
SUT VIC AB 1 CT1 27XBRD ANTBC (SUTURE) IMPLANT
SUT VIC AB 1-0 CT2 27 (SUTURE) IMPLANT
SUT VIC AB 2-0 CT1 27 (SUTURE)
SUT VIC AB 2-0 CT1 TAPERPNT 27 (SUTURE) IMPLANT
SUT VIC AB 2-0 CT2 27 (SUTURE) IMPLANT
SYR 3ML LL SCALE MARK (SYRINGE) IMPLANT
TOWEL OR 17X26 10 PK STRL BLUE (TOWEL DISPOSABLE) ×3 IMPLANT
TOWEL OR NON WOVEN STRL DISP B (DISPOSABLE) ×3 IMPLANT
TRAY FOLEY W/METER SILVER 14FR (SET/KITS/TRAYS/PACK) ×2 IMPLANT
YANKAUER SUCT BULB TIP NO VENT (SUCTIONS) ×3 IMPLANT

## 2016-08-26 NOTE — Discharge Instructions (Signed)

## 2016-08-26 NOTE — Anesthesia Preprocedure Evaluation (Addendum)
Anesthesia Evaluation  Patient identified by MRN, date of birth, ID band Patient awake    Reviewed: Allergy & Precautions, NPO status , Patient's Chart, lab work & pertinent test results  Airway Mallampati: II  TM Distance: >3 FB Neck ROM: Full    Dental no notable dental hx.    Pulmonary COPD, Current Smoker,    breath sounds clear to auscultation + wheezing      Cardiovascular negative cardio ROS Normal cardiovascular exam Rhythm:Regular Rate:Normal     Neuro/Psych negative neurological ROS  negative psych ROS   GI/Hepatic negative GI ROS, Neg liver ROS,   Endo/Other  negative endocrine ROS  Renal/GU negative Renal ROS  negative genitourinary   Musculoskeletal negative musculoskeletal ROS (+)   Abdominal   Peds negative pediatric ROS (+)  Hematology negative hematology ROS (+)   Anesthesia Other Findings   Reproductive/Obstetrics negative OB ROS                            Anesthesia Physical Anesthesia Plan  ASA: III  Anesthesia Plan: General   Post-op Pain Management:    Induction: Intravenous  Airway Management Planned: Oral ETT  Additional Equipment:   Intra-op Plan:   Post-operative Plan: Extubation in OR  Informed Consent: I have reviewed the patients History and Physical, chart, labs and discussed the procedure including the risks, benefits and alternatives for the proposed anesthesia with the patient or authorized representative who has indicated his/her understanding and acceptance.   Dental advisory given  Plan Discussed with: CRNA and Surgeon  Anesthesia Plan Comments:         Anesthesia Quick Evaluation

## 2016-08-26 NOTE — Evaluation (Signed)
Physical Therapy Evaluation Patient Details Name: Debra Farmer MRN: [id number] DOB: 1945/10/23 Today's Date: 08/26/2016   History of Present Illness  Pt s/p microlumbar decompression of L4-5, L5-S1  Clinical Impression  Pt s/p back surgery and presents with functional mobility limitations 2* post op pain and back precautions.  Mobilizing well and should progress to dc home with family assist.    Follow Up Recommendations No PT follow up    Equipment Recommendations  None recommended by PT    Recommendations for Other Services OT consult     Precautions / Restrictions Precautions Precautions: Back Precaution Booklet Issued: Yes (comment) Precaution Comments: Reviewed back precautions x 2 Restrictions Weight Bearing Restrictions: No      Mobility  Bed Mobility Overal bed mobility: Needs Assistance Bed Mobility: Supine to Sit;Sit to Supine     Supine to sit: Supervision Sit to supine: Supervision   General bed mobility comments: min cues for log roll technique  Transfers Overall transfer level: Needs assistance   Transfers: Sit to/from Stand Sit to Stand: Supervision         General transfer comment: cues for adherence to back precautions and use of UEs to self assist  Ambulation/Gait Ambulation/Gait assistance: Min guard;Supervision Ambulation Distance (Feet): 400 Feet Assistive device: None Gait Pattern/deviations: Step-through pattern;Decreased step length - right;Decreased step length - left;Shuffle     General Gait Details: decreased pace for age but no loss of balance noted  Stairs            Wheelchair Mobility    Modified Rankin (Stroke Patients Only)       Balance                                             Pertinent Vitals/Pain Pain Assessment: 0-10 Pain Score: 1  Pain Location: back Pain Descriptors / Indicators: Tender Pain Intervention(s): Limited activity within patient's tolerance;Premedicated  before session;Monitored during session    Home Living Family/patient expects to be discharged to:: Private residence Living Arrangements: Spouse/significant other Available Help at Discharge: Family Type of Home: House Home Access: Stairs to enter Entrance Stairs-Rails: Right;Left;Can reach both Technical brewer of Steps: 3 Home Layout: One level Home Equipment: None Additional Comments: Pt states she can borrow equipment from her church if needed    Prior Function Level of Independence: Independent               Hand Dominance        Extremity/Trunk Assessment   Upper Extremity Assessment Upper Extremity Assessment: Overall WFL for tasks assessed    Lower Extremity Assessment Lower Extremity Assessment: Overall WFL for tasks assessed       Communication   Communication: No difficulties  Cognition Arousal/Alertness: Awake/alert Behavior During Therapy: WFL for tasks assessed/performed Overall Cognitive Status: Within Functional Limits for tasks assessed                      General Comments      Exercises General Exercises - Lower Extremity Ankle Circles/Pumps: AROM;Both;20 reps;Supine   Assessment/Plan    PT Assessment Patient needs continued PT services  PT Problem List Decreased mobility;Decreased knowledge of use of DME;Pain;Decreased knowledge of precautions       PT Treatment Interventions DME instruction;Gait training;Stair training;Functional mobility training;Therapeutic activities;Therapeutic exercise;Patient/family education    PT Goals (Current goals can be found in the  Care Plan section)  Acute Rehab PT Goals Patient Stated Goal: Regain IND PT Goal Formulation: With patient Time For Goal Achievement: 08/29/16 Potential to Achieve Goals: Good    Frequency Min 6X/week   Barriers to discharge        Co-evaluation               End of Session   Activity Tolerance: Patient tolerated treatment well Patient  left: in chair;with call bell/phone within reach;with family/visitor present Nurse Communication: Mobility status PT Visit Diagnosis: Unsteadiness on feet (R26.81)    Functional Assessment Tool Used: Clinical judgement Functional Limitation: Mobility: Walking and moving around Mobility: Walking and Moving Around Current Status (N6394): At least 1 percent but less than 20 percent impaired, limited or restricted Mobility: Walking and Moving Around Goal Status [phone]): At least 1 percent but less than 20 percent impaired, limited or restricted    Time: 1640-1702 PT Time Calculation (min) (ACUTE ONLY): 22 min   Charges:   PT Evaluation $PT Eval Low Complexity: 1 Procedure     PT G Codes:   PT G-Codes **NOT FOR INPATIENT CLASS** Functional Assessment Tool Used: Clinical judgement Functional Limitation: Mobility: Walking and moving around Mobility: Walking and Moving Around Current Status (L9444): At least 1 percent but less than 20 percent impaired, limited or restricted Mobility: Walking and Moving Around Goal Status [phone]): At least 1 percent but less than 20 percent impaired, limited or restricted     [facility] 08/26/2016, 5:48 PM

## 2016-08-26 NOTE — Progress Notes (Signed)
Paged Azucena Fallen PA on call for assist with discharge  D Mateo Flow RN

## 2016-08-26 NOTE — Progress Notes (Signed)
1700 paged Jennette Kettle pa patient requesting discharge stated Dr Tonita Cong said that if she  was doing well she could go. D Rosine Abe

## 2016-08-26 NOTE — Interval H&P Note (Signed)
History and Physical Interval Note:  08/26/2016 8:32 AM  Debra Farmer  has presented today for surgery, with the diagnosis of Stenosis L4-S1  The various methods of treatment have been discussed with the patient and family. After consideration of risks, benefits and other options for treatment, the patient has consented to  Procedure(s): Microlumbar decompression L4-5, possible L5-S1 on Right (Right) as a surgical intervention .  The patient's history has been reviewed, patient examined, no change in status, stable for surgery.  I have reviewed the patient's chart and labs.  Questions were answered to the patient's satisfaction.     Erven Ramson C

## 2016-08-26 NOTE — Anesthesia Postprocedure Evaluation (Addendum)
Anesthesia Post Note  Patient: Debra Farmer  Procedure(s) Performed: Procedure(s) (LRB): Microlumbar decompression L4-5, L5-S1 on Right (Right)  Patient location during evaluation: PACU Anesthesia Type: General Level of consciousness: awake and alert Pain management: pain level controlled Vital Signs Assessment: post-procedure vital signs reviewed and stable Respiratory status: spontaneous breathing, nonlabored ventilation, respiratory function stable and patient connected to nasal cannula oxygen Cardiovascular status: blood pressure returned to baseline and stable Postop Assessment: no signs of nausea or vomiting Anesthetic complications: no       Last Vitals:  Vitals:   08/26/16 1125 08/26/16 1130  BP:  114/73  Pulse: 90 87  Resp: 12 10  Temp:      Last Pain:  Vitals:   08/26/16 1130  TempSrc:   PainSc: 6     LLE Motor Response: Responds to commands (08/26/16 1130) LLE Sensation: No numbness;No tingling (08/26/16 1130) RLE Motor Response: Responds to commands (08/26/16 1130) RLE Sensation: No numbness;No tingling (08/26/16 1130)      [name] S

## 2016-08-26 NOTE — Transfer of Care (Signed)
Immediate Anesthesia Transfer of Care Note  Patient: Debra Farmer  Procedure(s) Performed: Procedure(s): Microlumbar decompression L4-5, L5-S1 on Right (Right)  Patient Location: PACU  Anesthesia Type:General  Level of Consciousness:  sedated, patient cooperative and responds to stimulation  Airway & Oxygen Therapy:Patient Spontanous Breathing and Patient connected to face mask oxgen  Post-op Assessment:  Report given to PACU RN and Post -op Vital signs reviewed and stable  Post vital signs:  Reviewed and stable  Last Vitals:  Vitals:   08/26/16 0627 08/26/16 1050  BP: 125/66 (!) 143/66  Pulse: 70 100  Resp: 16 14  Temp: 37 C 00.9 C    Complications: No apparent anesthesia complications

## 2016-08-26 NOTE — Anesthesia Procedure Notes (Signed)
Procedure Name: Intubation Date/Time: 08/26/2016 8:40 AM Performed by: Ermalee Mealy, Virgel Gess Pre-anesthesia Checklist: Patient identified, Emergency Drugs available, Suction available, Patient being monitored and Timeout performed Patient Re-evaluated:Patient Re-evaluated prior to inductionOxygen Delivery Method: Circle system utilized Preoxygenation: Pre-oxygenation with 100% oxygen Intubation Type: IV induction Ventilation: Mask ventilation without difficulty Laryngoscope Size: Mac and 4 Grade View: Grade I Tube type: Oral Tube size: 7.5 mm Number of attempts: 1 Airway Equipment and Method: Stylet Placement Confirmation: ETT inserted through vocal cords under direct vision,  positive ETCO2,  CO2 detector and breath sounds checked- equal and bilateral Secured at: 21 cm Tube secured with: Tape Dental Injury: Teeth and Oropharynx as per pre-operative assessment

## 2016-08-26 NOTE — H&P (View-Only) (Signed)
Debra Farmer is an 71 y.o. female.   Chief Complaint: back and R leg pain HPI: The patient is a 71 year old female who presents today for follow up of their back. The patient is being followed for their low back symptoms. They are now 5 1/2 months out from when symptoms began. Symptoms reported today include: leg pain (right). The patient states that they are doing poorly. Current treatment includes: relative rest, activity modification and pain medications. The following medication has been used for pain control: tramadol (during the day) and Norco (at night). The patient presents today following right L5 SNRB. The patient reports the injection helped for 2 days.  Debra Farmer follows up. She had about a week or so relief from the injection Dr. Nelva Bush gave her L5 selective nerve root block. Now, it has returned and radiates down into buttock, thigh, and calf, top and medial aspect of the foot.  Review of systems is negative for fevers, chest pain, shortness of breath, unexplained recent weight loss, loss of bowel or bladder function, burning with urination, joint swelling, rashes, weakness or numbness, difficulty with balance, easy bruising, excessive thirst or frequent urination.      Past Medical History:  Diagnosis Date  . Arthritis   . COPD (chronic obstructive pulmonary disease) ([place])          Past Surgical History:  Procedure Laterality Date  . ABDOMINAL HYSTERECTOMY     partial  . APPENDECTOMY    . CHOLECYSTECTOMY    . KNEE ARTHROSCOPY  1999   Dr Theda Sers -- R knee          Family History  Problem Relation Age of Onset  . Heart disease Mother 82    CHF  . COPD Father 56    emphesema   Social History:  reports that she has been smoking Cigarettes.  She has been smoking about 0.50 packs per day. She has never used smokeless tobacco. She reports that she does not drink alcohol or use drugs.  Allergies:       Allergies  Allergen Reactions  .  Aspirin     REACTION: upset stomach  . Codeine Sulfate Other (See Comments)    "drugged out feeling" & hallucinations.  . Lipitor [Atorvastatin]     cramps  . Nabumetone Other (See Comments)    Unsure of reaction (reaction occurred sometime ago)     (Not in a hospital admission)  LabResultsLast48Hours  No results found for this or any previous visit (from the past 30 hour(s)).   ImagingResults(Last48hours)  No results found.    Review of Systems  Constitutional: Negative.   HENT: Negative.   Eyes: Negative.   Respiratory: Negative.   Cardiovascular: Negative.   Gastrointestinal: Negative.   Genitourinary: Negative.   Musculoskeletal: Positive for back pain.  Skin: Negative.   Neurological: Positive for sensory change and focal weakness.  Psychiatric/Behavioral: Negative.     There were no vitals taken for this visit. Physical Exam  Constitutional: She is oriented to person, place, and time. She appears well-developed.  HENT:  Head: Normocephalic.  Eyes: Pupils are equal, round, and reactive to light.  Neck: Normal range of motion.  Cardiovascular: Normal rate.   Respiratory: Effort normal.  GI: Soft.  Musculoskeletal:  On exam, moderate to severe distress, walks with antalgic gait. Straight leg raise with buttock, thigh, and calf pain on the right, negative on left. EHL is 4/5 on right compared to the left as is her quad  strength. When you lift her leg and then let it down, she does have groin pain associated with it. It is different than her back pain.  Neurological: She is alert and oriented to person, place, and time.  Skin: Skin is warm and dry.    Three-view radiographs, AP, lateral, flexion-extension demonstrate thoracolumbar scoliosis and severe facet arthrosis and lateral recess stenosis at 4-5 and at 5-1. Hips unremarkable.  MRI on December 30 demonstrates severe neural foraminal stenosis L4-5 on the right. Minimal listhesis of 5-1  on the right with foraminal stenosis L5-S1 on the right.  Disc herniation 2-3 to the left.  Assessment/Plan L4-L5 radiculopathy secondary to severe facet arthropathy and lateral recess stenosis, neuroforaminal stenosis at 4-5 and extended to L5 root down to L5-S1 refractory to rest, activity modification, injections, and therapy.  1. Given the persistent of her symptoms despite conservative treatment, presence of neurologic deficit, we discussed decompression at 4-5 with foraminotomy L4, probable hemilaminectomy at L5, partial medial hemifacectomy and decompressing the L5 root to L5-S1. We will take the removed bone and place it the lateral recess for possible lateral mass fusion. I had an extensive discussion of the risks and benefits of the lumbar decompression with the patient including bleeding, infection, damage to neurovascular structures, epidural fibrosis, CSF leak requiring repair. We also discussed increase in pain, adjacent segment disease, recurrent disc herniation, need for future surgery including repeat decompression and/or fusion. We also discussed risks of postoperative hematoma, paralysis, anesthetic complications including DVT, PE, death, cardiopulmonary dysfunction. In addition, the perioperative and postoperative courses were discussed in detail including the rehabilitative time and return to functional activity and work. I provided the patient with an illustrated handout and utilized the appropriate surgical models. 2. Would like to not stay overnight in the hospital. She does get nightmares. 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 She does have symptoms that are associated with that, but are clearly different than her intraspinal pain. 3. Tobacco cessation.  We talked about decompression on the right, Dr. Nelva Bush did an L5 selective nerve root block, which he felt was  th distribution. I think she is having two nerve root distributions of L4 and L5 on the right where her scoliosis is. I talked to her about a microlumbar decompression below level of spinal cord and the risks and benefits associated with that putting bone graft out into the lateral recess time overnight. She is still disabled by her symptoms, it keeps her from working. She is at home, medications have not worked, the therapy and the injections and we mutually agreed to proceed with that surgery after this further discussion. I have encouraged her that if there is any additional information to please call.  Plan microlumbar decompression L4-5, possible L5-S1 right, possible lateral mass fusion  Cobey Raineri M., PA-C for Dr. Tonita Cong

## 2016-08-26 NOTE — Progress Notes (Signed)
Pt very sleepy unable to perform assessment tasks

## 2016-08-26 NOTE — Progress Notes (Signed)
Discharge instructions given to patient and family.

## 2016-08-26 NOTE — Brief Op Note (Signed)
08/26/2016  10:29 AM  PATIENT:  Debra Farmer  71 y.o. female  PRE-OPERATIVE DIAGNOSIS:  Stenosis L4-S1  POST-OPERATIVE DIAGNOSIS:  Stenosis L4-S1  PROCEDURE:  Procedure(s): Microlumbar decompression L4-5, L5-S1 on Right (Right)  SURGEON:  Surgeon(s) and Role:    * Susa Day, MD - Primary  PHYSICIAN ASSISTANT:   ASSISTANTS: [place]   ANESTHESIA:   general  EBL:  Total I/O In: 1000 [I.V.:1000] Out: 100 [Blood:100]  BLOOD ADMINISTERED:none  DRAINS: none   LOCAL MEDICATIONS USED:  MARCAINE     SPECIMEN:  No Specimen  DISPOSITION OF SPECIMEN:  N/A  COUNTS:  YES  TOURNIQUET:  * No tourniquets in log *  DICTATION: .Other Dictation: Dictation Number   [id number]  PLAN OF CARE: Admit for overnight observation  PATIENT DISPOSITION:  PACU - hemodynamically stable.   Delay start of Pharmacological VTE agent (>24hrs) due to surgical blood loss or risk of bleeding: yes

## 2016-08-27 NOTE — Discharge Summary (Signed)
Physician Discharge Summary   Patient ID: Debra Farmer MRN: [id number] DOB/AGE: September 20, 1945 71 y.o.  Admit date: 08/26/2016 Discharge date: 08/27/2016  Primary Diagnosis:   Stenosis L4-S1  Admission Diagnoses:  Past Medical History:  Diagnosis Date  . Arthritis   . COPD (chronic obstructive pulmonary disease) ([place])   . GERD (gastroesophageal reflux disease)   . Neuromuscular disorder ([place])    bELLS PALSY  2 YEARS AGO   Discharge Diagnoses:   Principal Problem:   Spinal stenosis of lumbar region Active Problems:   Spinal stenosis at L4-L5 level  Procedure:  Procedure(s) (LRB): Microlumbar decompression L4-5, L5-S1 on Right (Right)   Consults: None  HPI:  see H&P    Laboratory Data: Hospital Outpatient Visit on 08/18/2016  Component Date Value Ref Range Status  . Sodium 08/18/2016 137  135 - 145 mmol/L Final  . Potassium 08/18/2016 4.1  3.5 - 5.1 mmol/L Final  . Chloride 08/18/2016 102  101 - 111 mmol/L Final  . CO2 08/18/2016 28  22 - 32 mmol/L Final  . Glucose, Bld 08/18/2016 110* 65 - 99 mg/dL Final  . BUN 08/18/2016 11  6 - 20 mg/dL Final  . Creatinine, Ser 08/18/2016 0.80  0.44 - 1.00 mg/dL Final  . Calcium 08/18/2016 9.5  8.9 - 10.3 mg/dL Final  . GFR calc non Af Amer 08/18/2016 >60  >60 mL/min Final  . GFR calc Af Amer 08/18/2016 >60  >60 mL/min Final   Comment: (NOTE) The eGFR has been calculated using the CKD EPI equation. This calculation has not been validated in all clinical situations. eGFR's persistently <60 mL/min signify possible Chronic Kidney Disease.   . Anion gap 08/18/2016 7  5 - 15 Final  . WBC 08/18/2016 10.4  4.0 - 10.5 K/uL Final  . RBC 08/18/2016 5.12* 3.87 - 5.11 MIL/uL Final  . Hemoglobin 08/18/2016 15.2* 12.0 - 15.0 g/dL Final  . HCT 08/18/2016 46.5* 36.0 - 46.0 % Final  . MCV 08/18/2016 90.8  78.0 - 100.0 fL Final  . MCH 08/18/2016 29.7  26.0 - 34.0 pg Final  . MCHC 08/18/2016 32.7  30.0 - 36.0 g/dL Final  . RDW 08/18/2016  13.3  11.5 - 15.5 % Final  . Platelets 08/18/2016 239  150 - 400 K/uL Final  . MRSA, PCR 08/18/2016 NEGATIVE  NEGATIVE Final  . Staphylococcus aureus 08/18/2016 NEGATIVE  NEGATIVE Final   Comment:        The Xpert SA Assay (FDA approved for NASAL specimens in patients over 85 years of age), is one component of a comprehensive surveillance program.  Test performance has been validated by [facility] for patients greater than or equal to 44 year old. It is not intended to diagnose infection nor to guide or monitor treatment.    No results for input(s): HGB in the last 72 hours. No results for input(s): WBC, RBC, HCT, PLT in the last 72 hours. No results for input(s): NA, K, CL, CO2, BUN, CREATININE, GLUCOSE, CALCIUM in the last 72 hours. No results for input(s): LABPT, INR in the last 72 hours.  X-Rays:Dg Spine Portable 1 View  Result Date: 08/26/2016 CLINICAL DATA:  L4-5 and possible L5-S1 decompression. EXAM: PORTABLE SPINE - 1 VIEW COMPARISON:  Intraoperative film from earlier the same day. FINDINGS: Portable cross-table lateral film labeled 3 was obtained at 0954 hours. Same numbering scheme used on this study as on the immediately prior intraoperative film. Soft tissue retractors are identified in the lower back. Radiopaque surgical  probes are overlying the lumbar canal. The caudal most probe tip is positioned over the L5 pedicle, just posterior to the vertebral body. The more cranially positioned tip overlies the L4 pedicle just posterior to the L4 vertebral body. IMPRESSION: Intraoperative localization. Electronically Signed   By: Misty Stanley M.D.   On: 08/26/2016 10:13   Dg Spine Portable 1 View  Result Date: 08/26/2016 CLINICAL DATA:  Lumbar decompression EXAM: PORTABLE SPINE - 1 VIEW COMPARISON:  Study obtained earlier in the day FINDINGS: Cross-table lateral lumbar image labeled #2 submitted. Metallic probe tips are posterior to the inferior aspect of the L4 vertebral body and  posterior to the midportion of the L5 vertebral body respectively. Cutting tool overlies the L5 spinous process. There is moderately severe disc space narrowing at L2-3 with vacuum phenomenon at this level. There is moderate disc space narrowing at L3-4 and L4-5. No fracture or spondylolisthesis. There is aortic atherosclerosis. IMPRESSION: Metallic probes with tips posterior to the inferior aspect of the L4 vertebral body and midportion of the L5 vertebral body respectively. Areas of osteoarthritic change, most marked at L2-3. There is aortic atherosclerosis. Electronically Signed   By: Lowella Grip III M.D.   On: 08/26/2016 09:22   Dg Spine Portable 1 View  Result Date: 08/26/2016 CLINICAL DATA:  Intraoperative localization film in patient undergoing lumbar spine surgery. EXAM: PORTABLE SPINE - 1 VIEW COMPARISON:  Two views lumbar spine 07/27/2016. FINDINGS: Two probes are in place. The more superior is directed toward the L5 pedicles of the more inferior is at the level of mid S1. IMPRESSION: Localization as above. Electronically Signed   By: Inge Rise M.D.   On: 08/26/2016 09:19    EKG: Orders placed or performed in visit on 07/26/09  . Converted CEM[facility] EKG     Hospital Course: Patient was admitted to [facility] and taken to the OR and underwent the above state procedure without complications.  Patient tolerated the procedure well and was later transferred to the recovery room and then to the orthopaedic floor for postoperative care.  They were given PO and IV analgesics for pain control following their surgery.  They were given postoperative antibiotics.   PT was consulted postop to assist with mobility and transfers.  The patient was allowed to be WBAT with therapy and was taught back precautions. Discharge planning was consulted to help with postop disposition and equipment needs.  Patient had a good night on the evening of surgery and started to get up OOB with therapy on day  zero. Patient was ready to go home on day zero.  They were given discharge instructions and dressing directions.  They were instructed on when to follow up in the office with Dr. Tonita Cong.   Diet: Regular diet Activity:WBAT Follow-up:in 10-14 days Disposition - Home Discharged Condition: good    Allergies as of 08/26/2016      Reactions   Aspirin    REACTION: upset stomach   Codeine Sulfate Other (See Comments)   "drugged out feeling" & hallucinations.   Lipitor [atorvastatin]    cramps   Nabumetone Other (See Comments)   Unsure of reaction (reaction occurred sometime ago)      Medication List    STOP taking these medications   Black Cohosh 40 MG Caps   gabapentin 100 MG capsule Commonly known as:  NEURONTIN   HUMIRA PEN 40 MG/0.8ML Pnkt Generic drug:  Adalimumab     TAKE these medications   acetaminophen 650 MG  CR tablet Commonly known as:  TYLENOL Take 650 mg by mouth every 8 (eight) hours as needed for pain (WITH TRAMADOL FOR PAIN RELIEF).   Biotin 5000 MCG Tabs Take 5,000 mcg by mouth daily.   docusate sodium 100 MG capsule Commonly known as:  COLACE Take 1 capsule (100 mg total) by mouth 2 (two) times daily as needed for mild constipation.   furosemide 20 MG tablet Commonly known as:  LASIX TAKE 1 TABLET BY MOUTH AS NEEDED FOR FLUID What changed:  See the new instructions.   HYDROcodone-acetaminophen 5-325 MG tablet Commonly known as:  NORCO/VICODIN Take 1-2 tablets by mouth every 4 (four) hours as needed for severe pain. What changed:  how much to take  when to take this  reasons to take this   ipratropium-albuterol 0.5-2.5 (3) MG/3ML Soln Commonly known as:  DUONEB Take 3 mLs by nebulization every 4 (four) hours as needed. What changed:  reasons to take this   leflunomide 20 MG tablet Commonly known as:  ARAVA Take 20 mg by mouth daily.   OSTEO BI-FLEX JOINT SHIELD PO Take 2 tablets by mouth daily.   polyethylene glycol packet Commonly  known as:  MIRALAX / GLYCOLAX Take 17 g by mouth daily.   predniSONE 1 MG tablet Commonly known as:  DELTASONE Take 4 mg by mouth daily with breakfast.   traMADol 50 MG tablet Commonly known as:  ULTRAM TAKE 1-2 TABLETS BY MOUTH TWICE DAILY ASNEEDED FOR PAIN   Vitamin B-12 5000 MCG Subl Place 5,000 mcg under the tongue daily.   vitamin C 1000 MG tablet Take 1,000 mg by mouth daily.   Vitamin D3 5000 units Tabs Take 5,000 Units by mouth at bedtime. WITH A GLASS OF MILK   zolpidem 10 MG tablet Commonly known as:  AMBIEN TAKE 1 TABLET BY MOUTH AT BEDTIME AS NEEDED FOR SLEEP What changed:  how much to take  how to take this  when to take this  reasons to take this  additional instructions      Follow-up Information    BEANE,JEFFREY C, MD Follow up in 2 week(s).   Specialty:  Orthopedic Surgery Contact information: [address] [place] [address] [phone]           Signed: Lacie Draft, PA-C Orthopaedic Surgery 08/27/2016, 8:42 AM

## 2016-08-28 NOTE — Op Note (Signed)
NAME:  Debra Farmer, Debra Farmer                  ACCOUNT NO.:  MEDICAL RECORD NO.:  [id number]  LOCATION:                                 FACILITY:  PHYSICIAN:  Susa Day, M.D.         DATE OF BIRTH:  DATE OF PROCEDURE:  08/26/2016 DATE OF DISCHARGE:                              OPERATIVE REPORT   PREOPERATIVE DIAGNOSES: 1. Spinal stenosis, L4-5 and L5-S1. 2. Scoliosis.  POSTOPERATIVE DIAGNOSES: 1. Spinal stenosis, L4-5 and L5-S1. 2. Scoliosis.  PROCEDURES PERFORMED: 1. Microlumbar decompression at L4-5, L5-S1. 2. Gill hemilaminectomy of L5. 3. Foraminotomies of L4, L5, and S1.  ANESTHESIA:  General.  ASSISTANT:  Cleophas Dunker, PA.  HISTORY:  A 71 year old female, who is having severe right lower extremity radicular pain secondary to lateral recess stenosis, scoliosis, facet arthrosis and arthropathy.  She had compressed in the lateral recess at 4-5, extending in the foramen of 5-1.  We discussed decompression at 4-5 and possibly at 5-1 to fully decompress the 5 root including possibility of a lumbar fusion; however, that was denied by her insurance company.  I felt though that if we had any instability would utilize a lateral mass fusion.  We discussed risks and benefits, discussed including bleeding, infection, damage to neurovascular structures, no change in symptoms, worsening symptoms, DVT, PE, anesthetic complications, etc.  TECHNIQUE:  With the patient in supine position, after induction of adequate general anesthesia, 2 g Kefzol, placed prone on the [place] frame.  All bony prominences were well padded.  Lumbar region was prepped and draped in usual sterile fashion.  Two 18-gauge spinal needles were utilized to localize 4-5, 5-1 interspace, confirmed with x- ray.  Incision was made from the spinous process of 4 to S1. Subcutaneous tissue was dissected.  Electrocautery was utilized to achieve hemostasis.  A 0.25% Marcaine with epinephrine was infiltrated in  the perimuscular tissue for hemostasis and anesthesia.  McCullough retractor was placed.  Operating microscope was draped and brought on the surgical field to confirm the level.  The patient had essentially absence of interlaminar windows at 4-5 and at 5-1.  I started at the L4- 5 area.  I used a combination of an osteotome and cutting osteophyte rongeur, 2 mm Kerrison and a micro curette to remove a portion of the hemilamina of 4 including the inferior process, approximately 50% of this was removed.  Then, the superior articulating process of 5 was identified.  Utilized a micro curette to delineate the plane between the ligamentum flavum from, the canal, and the superior articulating process.  I then removed that partially with a 2 and 3 mm Kerrison. Severe lateral recess stenosis was noted at this level at 4-5.  We decompressed lateral recess, the medial border of the pedicle.  We continued cephalad with hemilaminotomy and a full foraminotomy of L4. Then a hemilaminotomy of the cephalad edge of 5 was performed as well again.  Bone was hard and there was a small interlaminar window and severe facet hypertrophy.  Final full decompression of the L4 root, the lateral recess, and the hemi lamina of 4 we obtained confirmatory radiograph with a marker.  We decompressed the  cephalad edge to the pedicle of 4 down the foramen of 5.  I checked the foramen of 5 with a Woodson retractor and felt the exiting nerve root still to be stenotic. I continue caudad with a full hemilaminectomy of the L5 hemilamina to perform a full foraminotomy of 5 extending down below the pedicle of 5 into L5-S1.  Ligamentum flavum was removed as well.  Decompressing the lateral recess at 5-1, essentially almost ankylosed at 5-1.  Though I felt there was an additional compression of the 5 root out here and the foramen at 5-1 and underneath the hemi lamina of 5.  Again a generous foraminotomy was performed, protecting the  neural elements at all times with neuro patties.  There was extensive epidural venous plexus contributing to the compression, bipolar electrocautery was utilized to achieve hemostasis.  After a full decompression, neural probe passed freely at the foramen of S1-5, 4, and up through to the pedicle of 4.  I copiously irrigated the wound with antibiotic irrigation.  No evidence of CSF leakage or active bleeding.  Bipolar cautery had been utilized to achieve hemostasis.  I placed thrombin-soaked Gelfoam in the laminotomy defect and felt we address the pathology of the L5 nerve root.  There was no instability noted at 5-1 or at 4-5 following the decompression. I felt lateral mass fusion was unnecessary, I therefore, removed McCullough retractor, irrigated the paraspinous musculature, no active bleeding.  We closed the dorsal fascia with 1 Vicryl, subcu with 2-0, and skin with Prolene.  Sterile dressing applied.  Placed supine on the hospital bed, extubated without difficulty, and transported to the recovery room in satisfactory condition.  The patient tolerated the procedure well with no complications. Assistant, Cleophas Dunker, [place].  Minimal blood loss.     Susa Day, M.D.     Geralynn Rile  D:  08/26/2016  T:  08/26/2016  Job:  [id number]

## 2016-08-31 ENCOUNTER — Other Ambulatory Visit: Payer: Self-pay | Admitting: Internal Medicine

## 2016-09-02 NOTE — Telephone Encounter (Signed)
Called refill back to pharmacy spoke w/Anna gave MD approval.../lmb

## 2016-09-10 NOTE — Op Note (Signed)
NAME:  Debra Farmer, Debra Farmer                  ACCOUNT NO.:  MEDICAL RECORD NO.:  [id number]  LOCATION:                                 FACILITY:  PHYSICIAN:  Susa Day, M.D.         DATE OF BIRTH:  DATE OF PROCEDURE:  08/26/2016 DATE OF DISCHARGE:                              OPERATIVE REPORT   ADDENDUM:  Change in the operative summary of Debra Farmer, 08/26/2016 was the date.  Under procedure performed, #2 it is a hemilaminectomy of L5 on the left, not a Gill hemilaminectomy of L5.     Susa Day, M.D.     Geralynn Rile  D:  09/09/2016  T:  09/09/2016  Job:  [id number]

## 2016-10-19 ENCOUNTER — Encounter: Payer: Self-pay | Admitting: Internal Medicine

## 2016-10-19 ENCOUNTER — Ambulatory Visit (INDEPENDENT_AMBULATORY_CARE_PROVIDER_SITE_OTHER): Payer: Managed Care, Other (non HMO) | Admitting: Internal Medicine

## 2016-10-19 VITALS — BP 126/68 | HR 71 | Temp 98.2°F | Ht 60.0 in | Wt 125.1 lb

## 2016-10-19 DIAGNOSIS — N959 Unspecified menopausal and perimenopausal disorder: Secondary | ICD-10-CM

## 2016-10-19 DIAGNOSIS — M0600 Rheumatoid arthritis without rheumatoid factor, unspecified site: Secondary | ICD-10-CM

## 2016-10-19 DIAGNOSIS — G4700 Insomnia, unspecified: Secondary | ICD-10-CM | POA: Diagnosis not present

## 2016-10-19 DIAGNOSIS — R6882 Decreased libido: Secondary | ICD-10-CM | POA: Diagnosis not present

## 2016-10-19 DIAGNOSIS — M1611 Unilateral primary osteoarthritis, right hip: Secondary | ICD-10-CM | POA: Diagnosis not present

## 2016-10-19 DIAGNOSIS — Z0000 Encounter for general adult medical examination without abnormal findings: Secondary | ICD-10-CM

## 2016-10-19 MED ORDER — ZOLPIDEM TARTRATE 10 MG PO TABS: ORAL_TABLET | ORAL | 2 refills | Status: DC

## 2016-10-19 MED ORDER — TRAMADOL HCL 50 MG PO TABS: ORAL_TABLET | ORAL | 1 refills | Status: DC

## 2016-10-19 NOTE — Progress Notes (Signed)
Subjective:  Patient ID: Debra Farmer, female    DOB: 26-Nov-1945  Age: 71 y.o. MRN: [id number]  CC: No chief complaint on file.   HPI Debra Farmer presents for a well exam. F/u  LBP, OA, RA, insomnia, B12 def. C/o decreased lipido  Outpatient Medications Prior to Visit  Medication Sig Dispense Refill  . acetaminophen (TYLENOL) 650 MG CR tablet Take 650 mg by mouth every 8 (eight) hours as needed for pain (WITH TRAMADOL FOR PAIN RELIEF).    . Ascorbic Acid (VITAMIN C) 1000 MG tablet Take 1,000 mg by mouth daily.    . Biotin 5000 MCG TABS Take 5,000 mcg by mouth daily.    . Cholecalciferol (VITAMIN D3) 5000 units TABS Take 5,000 Units by mouth at bedtime. WITH A GLASS OF MILK    . Cyanocobalamin (VITAMIN B-12) 5000 MCG SUBL Place 5,000 mcg under the tongue daily.    . furosemide (LASIX) 20 MG tablet TAKE 1 TABLET BY MOUTH AS NEEDED FOR FLUID (Patient taking differently: TAKE 1 TABLET (20 MG) BY MOUTH AS NEEDED FOR FLUID RETENTION) 30 tablet 1  . ipratropium-albuterol (DUONEB) 0.5-2.5 (3) MG/3ML SOLN Take 3 mLs by nebulization every 4 (four) hours as needed. (Patient taking differently: Take 3 mLs by nebulization every 4 (four) hours as needed (for shortness of breath/wheezing). ) 360 mL 3  . leflunomide (ARAVA) 20 MG tablet Take 20 mg by mouth daily.    . Misc Natural Products (OSTEO BI-FLEX JOINT SHIELD PO) Take 2 tablets by mouth daily.    . predniSONE (DELTASONE) 1 MG tablet Take 4 mg by mouth daily with breakfast.    . traMADol (ULTRAM) 50 MG tablet TAKE 1-2 TABLETS BY MOUTH TWICE DAILY ASNEEDED FOR PAIN 120 tablet 1  . zolpidem (AMBIEN) 10 MG tablet TAKE 1 TABLET BY MOUTH EACH NIGHT AT BEDTIME AS NEEDED FOR SLEEP 30 tablet 2  . docusate sodium (COLACE) 100 MG capsule Take 1 capsule (100 mg total) by mouth 2 (two) times daily as needed for mild constipation. 30 capsule 1  . HYDROcodone-acetaminophen (NORCO/VICODIN) 5-325 MG tablet Take 1-2 tablets by mouth every 4 (four) hours  as needed for severe pain. 40 tablet 0  . polyethylene glycol (MIRALAX / GLYCOLAX) packet Take 17 g by mouth daily. 14 each 0   No facility-administered medications prior to visit.     ROS Review of Systems  Constitutional: Positive for fatigue. Negative for activity change, appetite change, chills and unexpected weight change.  HENT: Negative for congestion, mouth sores and sinus pressure.   Eyes: Negative for visual disturbance.  Respiratory: Negative for cough and chest tightness.   Gastrointestinal: Negative for abdominal pain and nausea.  Genitourinary: Negative for difficulty urinating, frequency and vaginal pain.  Musculoskeletal: Positive for arthralgias, back pain and gait problem.  Skin: Negative for pallor and rash.  Neurological: Negative for dizziness, tremors, weakness, numbness and headaches.  Psychiatric/Behavioral: Negative for confusion, self-injury, sleep disturbance and suicidal ideas. The patient is nervous/anxious.     Objective:  BP 126/68 (BP Location: Left Arm, Patient Position: Sitting, Cuff Size: Normal)   Pulse 71   Temp 98.2 F (36.8 C) (Oral)   Ht 5' (1.524 m)   Wt 125 lb 1.3 oz (56.7 kg)   SpO2 96%   BMI 24.43 kg/m   BP Readings from Last 3 Encounters:  10/19/16 126/68  08/26/16 (!) 124/58  08/18/16 127/65    Wt Readings from Last 3 Encounters:  10/19/16 125 lb 1.3  oz (56.7 kg)  08/26/16 119 lb (54 kg)  08/18/16 119 lb (54 kg)    Physical Exam  Constitutional: She appears well-developed. No distress.  HENT:  Head: Normocephalic.  Right Ear: External ear normal.  Left Ear: External ear normal.  Nose: Nose normal.  Mouth/Throat: Oropharynx is clear and moist.  Eyes: Conjunctivae are normal. Pupils are equal, round, and reactive to light. Right eye exhibits no discharge. Left eye exhibits no discharge.  Neck: Normal range of motion. Neck supple. No JVD present. No tracheal deviation present. No thyromegaly present.  Cardiovascular:  Normal rate, regular rhythm and normal heart sounds.   Pulmonary/Chest: No stridor. No respiratory distress. She has no wheezes.  Abdominal: Soft. Bowel sounds are normal. She exhibits no distension and no mass. There is no tenderness. There is no rebound and no guarding.  Musculoskeletal: She exhibits tenderness. She exhibits no edema.  Lymphadenopathy:    She has no cervical adenopathy.  Neurological: She displays normal reflexes. No cranial nerve deficit. She exhibits normal muscle tone. Coordination normal.  Skin: No rash noted. No erythema.  Psychiatric: She has a normal mood and affect. Her behavior is normal. Judgment and thought content normal.  R hip is tender LS is NT  Lab Results  Component Value Date   WBC 10.4 08/18/2016   HGB 15.2 (H) 08/18/2016   HCT 46.5 (H) 08/18/2016   PLT 239 08/18/2016   GLUCOSE 110 (H) 08/18/2016   CHOL 257 (H) 12/05/2014   TRIG 219.0 (H) 12/05/2014   HDL 40.30 12/05/2014   LDLDIRECT 170.0 12/05/2014   LDLCALC 166 (H) 09/13/2013   ALT 18 12/05/2014   AST 21 12/05/2014   NA 137 08/18/2016   K 4.1 08/18/2016   CL 102 08/18/2016   CREATININE 0.80 08/18/2016   BUN 11 08/18/2016   CO2 28 08/18/2016   TSH 1.63 12/05/2014    No results found.  Assessment & Plan:   There are no diagnoses linked to this encounter. I have discontinued Ms. Raetz's HYDROcodone-acetaminophen, docusate sodium, and polyethylene glycol. I am also having her maintain her furosemide, ipratropium-albuterol, predniSONE, leflunomide, Vitamin B-12, acetaminophen, vitamin C, Vitamin D3, Biotin, Misc Natural Products (OSTEO BI-FLEX JOINT SHIELD PO), traMADol, zolpidem, and HUMIRA PEN.  Meds ordered this encounter  Medications  . HUMIRA PEN 40 MG/0.8ML PNKT     Follow-up: No Follow-up on file.  Walker Kehr, MD

## 2016-10-19 NOTE — Assessment & Plan Note (Signed)
Chronic Zolpidem  Potential benefits of a long term benzodiazepines  use as well as potential risks  and complications were explained to the patient and were aknowledged. 

## 2016-10-19 NOTE — Assessment & Plan Note (Signed)
R hip - Dr Maxie Better - THR in summer of 2018 Tramadol prn

## 2016-10-19 NOTE — Assessment & Plan Note (Signed)
Tramadol prn ° Potential benefits of a long term opioids use as well as potential risks (i.e. addiction risk, apnea etc) and complications (i.e. Somnolence, constipation and others) were explained to the patient and were aknowledged. ° ° °

## 2016-10-19 NOTE — Progress Notes (Signed)
Pre visit review using our clinic review tool, if applicable. No additional management support is needed unless otherwise documented below in the visit note. 

## 2016-10-19 NOTE — Patient Instructions (Addendum)
L-arginine (ArginMax)   Shingrix

## 2016-10-19 NOTE — Assessment & Plan Note (Signed)
L-arginine (ArginMax)

## 2016-10-19 NOTE — Assessment & Plan Note (Signed)
We discussed age appropriate health related issues, including available/recomended screening tests and vaccinations. We discussed a need for adhering to healthy diet and exercise. Labs  were ordered. All questions were answered. DEXA

## 2016-10-20 ENCOUNTER — Ambulatory Visit (INDEPENDENT_AMBULATORY_CARE_PROVIDER_SITE_OTHER)
Admission: RE | Admit: 2016-10-20 | Discharge: 2016-10-20 | Disposition: A | Discharge status: Home / Self Care | Payer: Managed Care, Other (non HMO) | Source: Ambulatory Visit | Attending: Internal Medicine | Admitting: Internal Medicine

## 2016-10-20 DIAGNOSIS — N959 Unspecified menopausal and perimenopausal disorder: Secondary | ICD-10-CM | POA: Diagnosis not present

## 2016-10-20 DIAGNOSIS — Z0000 Encounter for general adult medical examination without abnormal findings: Secondary | ICD-10-CM

## 2016-11-16 NOTE — Addendum Note (Signed)
Addendum  created 11/16/16 1220 by Myrtie Soman, MD   Sign clinical note

## 2016-12-17 ENCOUNTER — Other Ambulatory Visit: Payer: Self-pay | Admitting: Internal Medicine

## 2016-12-17 NOTE — Telephone Encounter (Signed)
Routing to dr plotnikov, please advise, thanks 

## 2016-12-18 NOTE — Telephone Encounter (Signed)
Called into pharm  

## 2017-02-18 ENCOUNTER — Other Ambulatory Visit: Payer: Self-pay | Admitting: Internal Medicine

## 2017-02-20 IMAGING — DX DG LUMBAR SPINE 2-3V
2 series · 2 of 2 positions shown · non-contrast
Comparison: Bone window images from CTA chest and abdomen
03/23/2006.

CLINICAL DATA: Preoperative evaluation for L4-5 and possible L5-S1
discectomy and decompression.

EXAM:
LUMBAR SPINE - 2-3 VIEW

[l-spine ap]
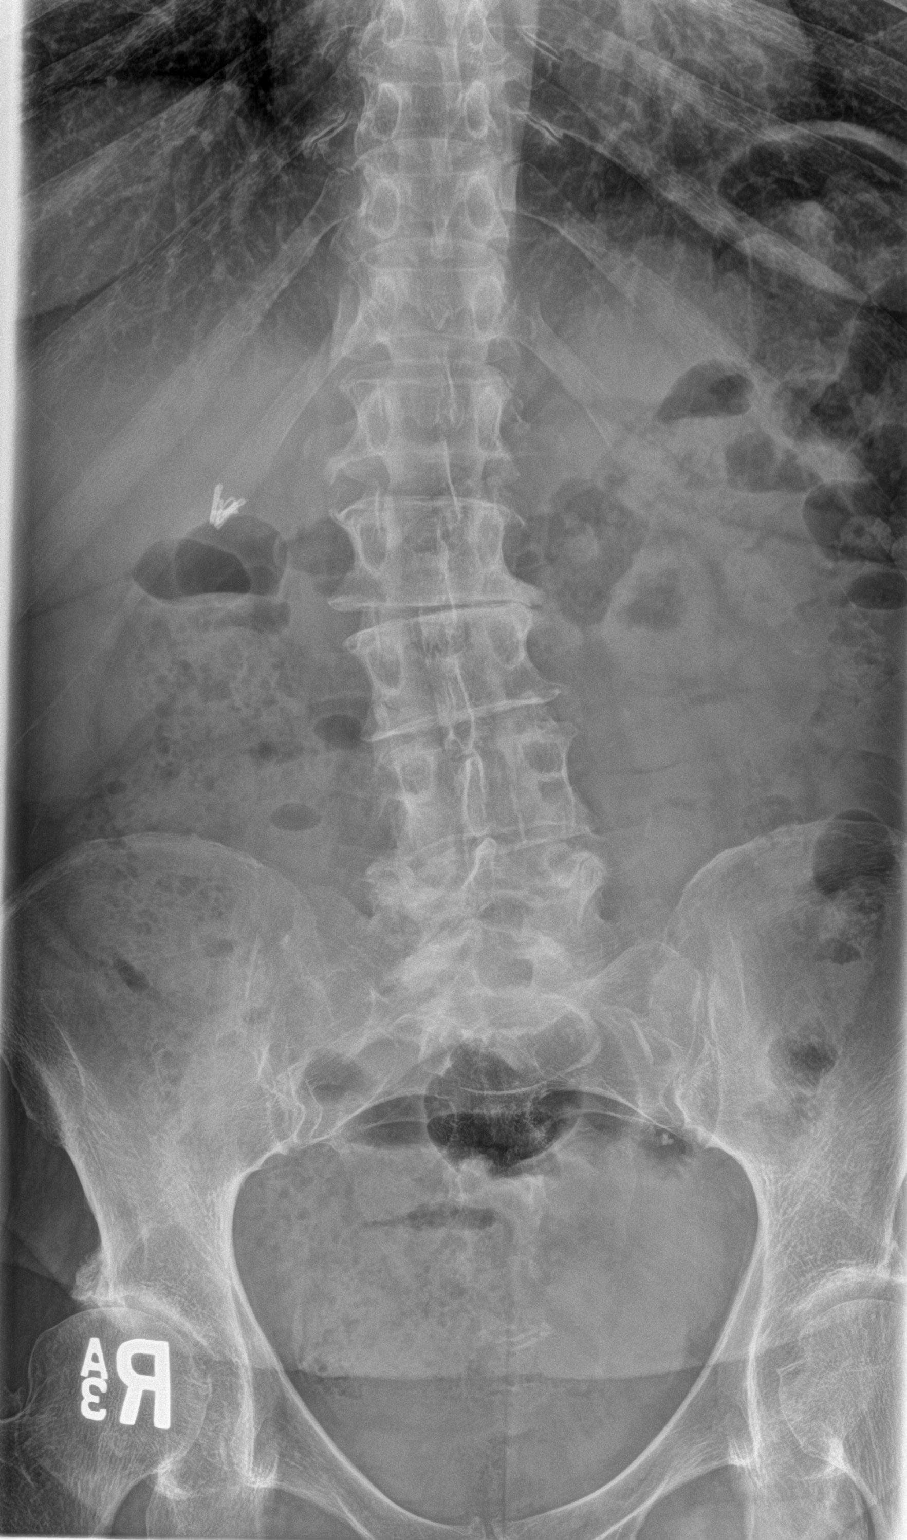

[l-spine lat]
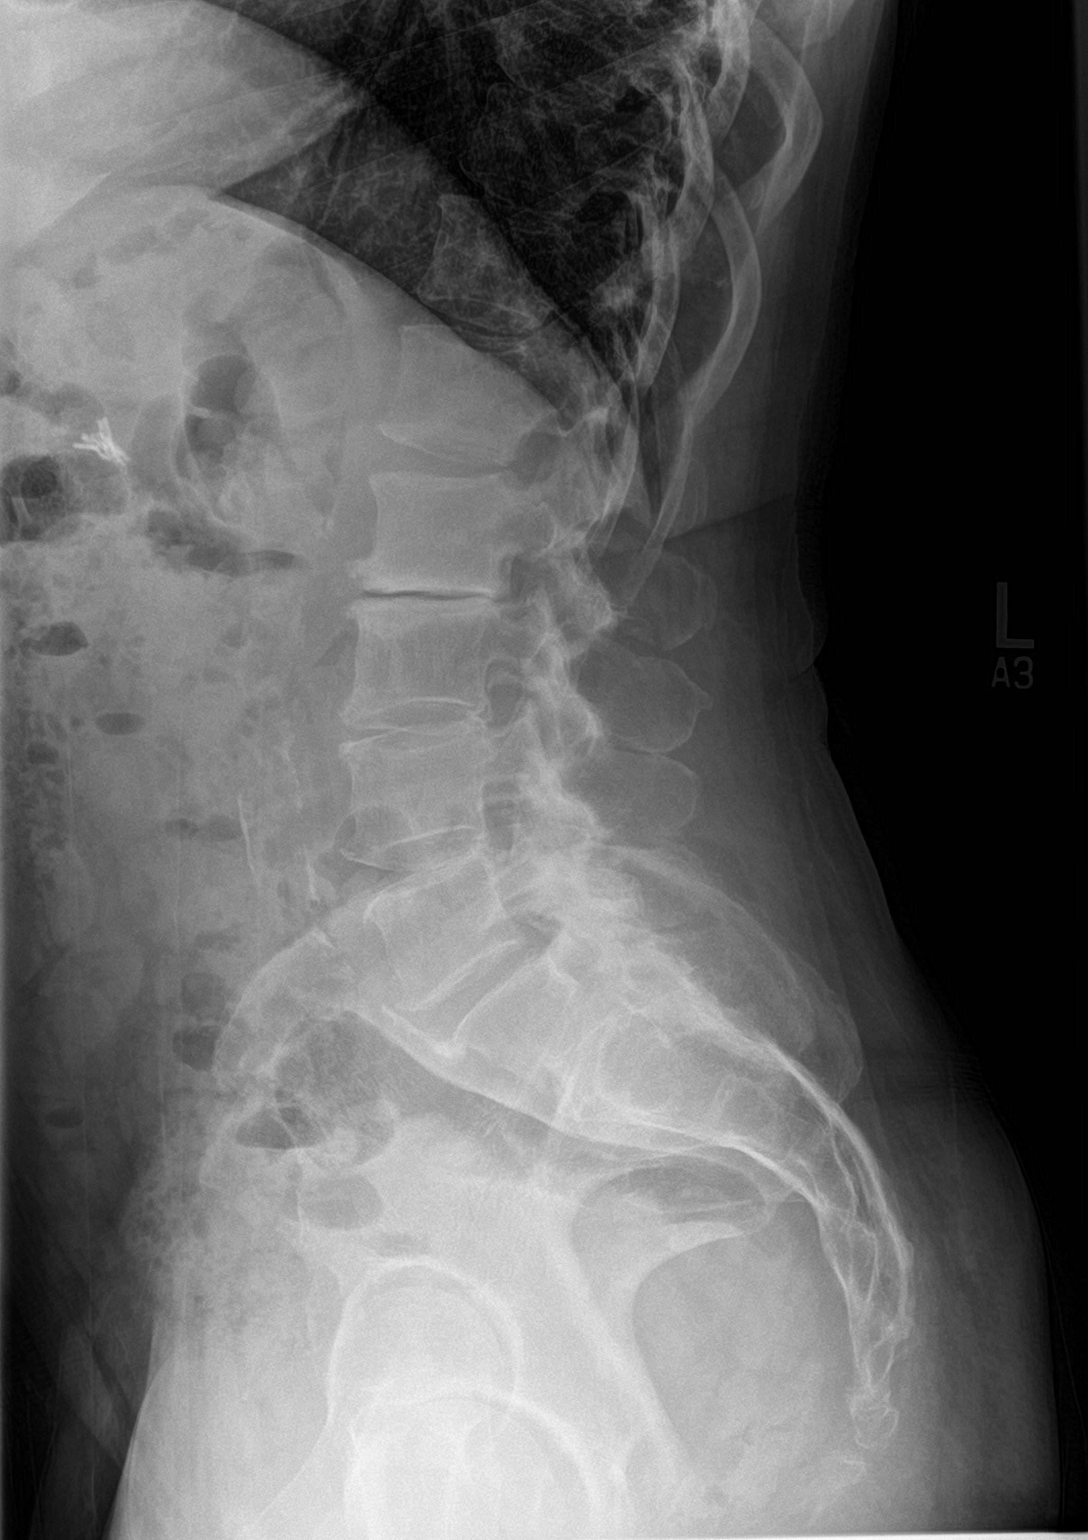

[2 of 2 positions shown; findings below may reference images not displayed]

FINDINGS: Five non-rib-bearing lumbar vertebrae with anatomic posterior
alignment. Slight thoracolumbar dextroscoliosis. No fractures.
Severe disc space narrowing and endplate hypertrophic changes at
L2-3. Mild disc space narrowing and endplate hypertrophic changes at
L3-4, L4-5 and L5-S1. Facet degenerative changes diffusely.

Abdominal aortic atherosclerosis without aneurysm.
IMPRESSION: 1. Five non-rib-bearing lumbar vertebrae.
2. Severe degenerative disc disease at L2-3. Mild degenerative disc
disease L3-4, L4-5 and L5-S1.
3. Diffuse facet degenerative changes.

## 2017-02-22 NOTE — Telephone Encounter (Signed)
Patient was supposed to have a f/u last month, please advise.

## 2017-02-23 NOTE — Telephone Encounter (Signed)
Patient has called back in regard.  States she is seeing orthopedic doctor who is getting ready to perform surgery.  Also states she has seen her arthritis doctor and had labs done.  I did inform patient she still may need a med fu.   Patient would like phone call in regard at [phone].

## 2017-02-25 ENCOUNTER — Telehealth: Payer: Self-pay | Admitting: Internal Medicine
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
## 2017-02-26 ENCOUNTER — Ambulatory Visit: Payer: Managed Care, Other (non HMO) | Admitting: Internal Medicine

## 2017-02-26 NOTE — Telephone Encounter (Signed)
OK to fill this/these prescription(s) with additional refills x2 Needs to have an OV every 3 months Thank you!  

## 2017-03-01 MED ORDER — ZOLPIDEM TARTRATE 10 MG PO TABS: ORAL_TABLET | ORAL | 1 refills | Status: DC

## 2017-03-01 NOTE — Telephone Encounter (Signed)
Faxed to pharmacy

## 2017-03-01 NOTE — Addendum Note (Signed)
Addended by: Karren Cobble on: 03/01/2017 09:59 AM   Modules accepted: Orders

## 2017-03-03 ENCOUNTER — Telehealth: Payer: Self-pay

## 2017-03-03 ENCOUNTER — Encounter: Payer: Self-pay | Admitting: Internal Medicine

## 2017-03-03 ENCOUNTER — Ambulatory Visit (INDEPENDENT_AMBULATORY_CARE_PROVIDER_SITE_OTHER): Payer: Managed Care, Other (non HMO) | Admitting: Internal Medicine

## 2017-03-03 VITALS — BP 130/70 | HR 83 | Temp 98.7°F | Ht 60.0 in | Wt 121.0 lb

## 2017-03-03 DIAGNOSIS — J449 Chronic obstructive pulmonary disease, unspecified: Secondary | ICD-10-CM

## 2017-03-03 DIAGNOSIS — Z23 Encounter for immunization: Secondary | ICD-10-CM

## 2017-03-03 DIAGNOSIS — M810 Age-related osteoporosis without current pathological fracture: Secondary | ICD-10-CM | POA: Diagnosis not present

## 2017-03-03 DIAGNOSIS — M0600 Rheumatoid arthritis without rheumatoid factor, unspecified site: Secondary | ICD-10-CM

## 2017-03-03 DIAGNOSIS — M160 Bilateral primary osteoarthritis of hip: Secondary | ICD-10-CM

## 2017-03-03 MED ORDER — TRAMADOL HCL 50 MG PO TABS: ORAL_TABLET | ORAL | 2 refills | Status: DC

## 2017-03-03 MED ORDER — ZOLPIDEM TARTRATE 10 MG PO TABS: ORAL_TABLET | ORAL | 2 refills | Status: DC

## 2017-03-03 NOTE — Telephone Encounter (Signed)
Patient needs to start prolia per dr plotnikov---I will be verifying insurance and will call patient with summary of benefits and to answer any questions she may have

## 2017-03-03 NOTE — Progress Notes (Signed)
Subjective:  Patient ID: Debra Farmer, female    DOB: May 20, 1946  Age: 71 y.o. MRN: [id number]  CC: No chief complaint on file.   HPI Debra Farmer presents for RA, R hip pain, osteoporosis f/u  Outpatient Medications Prior to Visit  Medication Sig Dispense Refill  . acetaminophen (TYLENOL) 650 MG CR tablet Take 650 mg by mouth every 8 (eight) hours as needed for pain (WITH TRAMADOL FOR PAIN RELIEF).    . Ascorbic Acid (VITAMIN C) 1000 MG tablet Take 1,000 mg by mouth daily.    . Biotin 5000 MCG TABS Take 5,000 mcg by mouth daily.    . Cholecalciferol (VITAMIN D3) 5000 units TABS Take 5,000 Units by mouth at bedtime. WITH A GLASS OF MILK    . Cyanocobalamin (VITAMIN B-12) 5000 MCG SUBL Place 5,000 mcg under the tongue daily.    . furosemide (LASIX) 20 MG tablet TAKE 1 TABLET BY MOUTH AS NEEDED FOR FLUID (Patient taking differently: TAKE 1 TABLET (20 MG) BY MOUTH AS NEEDED FOR FLUID RETENTION) 30 tablet 1  . HUMIRA PEN 40 MG/0.8ML PNKT     . ipratropium-albuterol (DUONEB) 0.5-2.5 (3) MG/3ML SOLN Take 3 mLs by nebulization every 4 (four) hours as needed. (Patient taking differently: Take 3 mLs by nebulization every 4 (four) hours as needed (for shortness of breath/wheezing). ) 360 mL 3  . leflunomide (ARAVA) 20 MG tablet Take 20 mg by mouth daily.    . Misc Natural Products (OSTEO BI-FLEX JOINT SHIELD PO) Take 2 tablets by mouth daily.    . predniSONE (DELTASONE) 1 MG tablet Take 4 mg by mouth daily with breakfast.    . traMADol (ULTRAM) 50 MG tablet TAKE 1 TO 2 TABLETS BY MOUTH TWICE DAILYAS NEEDED FOR PAIN 120 tablet 2  . zolpidem (AMBIEN) 10 MG tablet TAKE 1 TABLET BY MOUTH EACH NIGHT AT BEDTIME AS NEEDED FOR SLEEP 30 tablet 1   No facility-administered medications prior to visit.     ROS Review of Systems  Constitutional: Negative for activity change, appetite change, chills, fatigue and unexpected weight change.  HENT: Negative for congestion, mouth sores and sinus  pressure.   Eyes: Negative for visual disturbance.  Respiratory: Negative for cough and chest tightness.   Gastrointestinal: Negative for abdominal pain and nausea.  Genitourinary: Negative for difficulty urinating, frequency and vaginal pain.  Musculoskeletal: Positive for arthralgias. Negative for back pain and gait problem.  Skin: Negative for pallor and rash.  Neurological: Negative for dizziness, tremors, weakness, numbness and headaches.  Psychiatric/Behavioral: Negative for confusion and sleep disturbance.    Objective:  BP 130/70 (BP Location: Left Arm, Patient Position: Sitting, Cuff Size: Normal)   Pulse 83   Temp 98.7 F (37.1 C) (Oral)   Ht 5' (1.524 m)   Wt 121 lb (54.9 kg)   SpO2 98%   BMI 23.63 kg/m   BP Readings from Last 3 Encounters:  03/03/17 130/70  10/19/16 126/68  08/26/16 (!) 124/58    Wt Readings from Last 3 Encounters:  03/03/17 121 lb (54.9 kg)  10/19/16 125 lb 1.3 oz (56.7 kg)  08/26/16 119 lb (54 kg)    Physical Exam  Constitutional: She appears well-developed. No distress.  HENT:  Head: Normocephalic.  Right Ear: External ear normal.  Left Ear: External ear normal.  Nose: Nose normal.  Mouth/Throat: Oropharynx is clear and moist.  Eyes: Pupils are equal, round, and reactive to light. Conjunctivae are normal. Right eye exhibits no discharge. Left eye  exhibits no discharge.  Neck: Normal range of motion. Neck supple. No JVD present. No tracheal deviation present. No thyromegaly present.  Cardiovascular: Normal rate, regular rhythm and normal heart sounds.   Pulmonary/Chest: No stridor. No respiratory distress. She has no wheezes.  Abdominal: Soft. Bowel sounds are normal. She exhibits no distension and no mass. There is no tenderness. There is no rebound and no guarding.  Musculoskeletal: She exhibits tenderness. She exhibits no edema.  Lymphadenopathy:    She has no cervical adenopathy.  Neurological: She displays normal reflexes. No  cranial nerve deficit. She exhibits normal muscle tone. Coordination normal.  Skin: No rash noted. No erythema.  Psychiatric: She has a normal mood and affect. Her behavior is normal. Judgment and thought content normal.  R hip - stiff and painful w/ROM  Lab Results  Component Value Date   WBC 10.4 08/18/2016   HGB 15.2 (H) 08/18/2016   HCT 46.5 (H) 08/18/2016   PLT 239 08/18/2016   GLUCOSE 110 (H) 08/18/2016   CHOL 257 (H) 12/05/2014   TRIG 219.0 (H) 12/05/2014   HDL 40.30 12/05/2014   LDLDIRECT 170.0 12/05/2014   LDLCALC 166 (H) 09/13/2013   ALT 18 12/05/2014   AST 21 12/05/2014   NA 137 08/18/2016   K 4.1 08/18/2016   CL 102 08/18/2016   CREATININE 0.80 08/18/2016   BUN 11 08/18/2016   CO2 28 08/18/2016   TSH 1.63 12/05/2014    Dg Bone Density  Result Date: 10/22/2016 Date of study: 10/20/2016 Exam: DUAL X-RAY ABSORPTIOMETRY (DXA) FOR BONE MINERAL DENSITY (BMD) Instrument: [employer] Provider: PCP Indication: screening for low BMD Comparison: none (please note that it is not possible to compare data from different instruments) Clinical data: Pt is a 71 y.o. female without previous history of fracture. On calcium and vitamin D. Results:  Lumbar spine L1-L3 (L2, L4) Femoral neck (FN) T-score -0.3 RFN: -1.8 LFN: -1.8 Assessment: the BMD is low according to the [facility] classification for osteoporosis (see below). Fracture risk: moderate FRAX score: 10 year major osteoporotic risk: 23.6%. 10 year hip fracture risk: 8%. These are above the thresholds for treatment of 20% and 3%, respectively. Comments: the technical quality of the study is good, however, the spine is scoliotic and arthritic. Calcium accumulation in arthritic sites can confound the results of the bone density scan. L2 and L4 vertebrae had to be excluded from analysis due to degenerative changes. Evaluation for secondary causes should be considered if clinically indicated. Recommend optimizing calcium (1200  mg/day) and vitamin D (800 IU/day) intake. Followup: Repeat BMD is appropriate after 2 years or after 1-2 years if starting treatment. WHO criteria for diagnosis of osteoporosis in postmenopausal women and in men 59 y/o or older: - normal: T-score -1.0 to + 1.0 - osteopenia/low bone density: T-score between -2.5 and -1.0 - osteoporosis: T-score below -2.5 - severe osteoporosis: T-score below -2.5 with history of fragility fracture Note: although not part of the WHO classification, the presence of a fragility fracture, regardless of the T-score, should be considered diagnostic of osteoporosis, provided other causes for the fracture have been excluded. Treatment: The National Osteoporosis Foundation recommends that treatment be considered in postmenopausal women and men age 74 or older with: 1. Hip or vertebral (clinical or morphometric) fracture 2. T-score of - 2.5 or lower at the spine or hip 3. 10-year fracture probability by FRAX of at least 20% for a major osteoporotic fracture and 3% for a hip fracture Philemon Kingdom, MD  [facility]    Assessment & Plan:   There are no diagnoses linked to this encounter. I am having Ms. Sill maintain her furosemide, ipratropium-albuterol, predniSONE, leflunomide, Vitamin B-12, acetaminophen, vitamin C, Vitamin D3, Biotin, Misc Natural Products (OSTEO BI-FLEX JOINT SHIELD PO), HUMIRA PEN, traMADol, and zolpidem.  No orders of the defined types were placed in this encounter.    Follow-up: No Follow-up on file.  Walker Kehr, MD

## 2017-03-03 NOTE — Assessment & Plan Note (Signed)
On steroids for RA Options discussed Start Prolia

## 2017-03-03 NOTE — Assessment & Plan Note (Signed)
Trying to smoke less 

## 2017-03-03 NOTE — Assessment & Plan Note (Signed)
R THR is planned

## 2017-03-03 NOTE — Assessment & Plan Note (Signed)
On Humira+ Arava +Prednisone Tramadol prn  Potential benefits of a long term opioids use as well as potential risks (i.e. addiction risk, apnea etc) and complications (i.e. Somnolence, constipation and others) were explained to the patient and were aknowledged.

## 2017-03-16 NOTE — Progress Notes (Signed)
Please place orders in EPIC as patient is being scheduled for a pre-op appointment! Thank you! 

## 2017-03-17 ENCOUNTER — Ambulatory Visit: Payer: Self-pay | Admitting: Orthopedic Surgery

## 2017-03-17 NOTE — H&P (Signed)
TOTAL HIP ADMISSION H&P  Patient is admitted for right total hip arthroplasty.  Subjective:  Chief Complaint: right hip pain  HPI: Debra Farmer, 71 y.o. female, has a history of pain and functional disability in the right hip(s) due to arthritis and patient has failed non-surgical conservative treatments for greater than 12 weeks to include NSAID's and/or analgesics, flexibility and strengthening excercises, use of assistive devices, weight reduction as appropriate and activity modification.  Onset of symptoms was gradual starting 2 years ago with rapidlly worsening course since that time.The patient noted no past surgery on the right hip(s).  Patient currently rates pain in the right hip at 10 out of 10 with activity. Patient has night pain, worsening of pain with activity and weight bearing, trendelenberg gait, pain that interfers with activities of daily living, pain with passive range of motion and crepitus. Patient has evidence of subchondral cysts, subchondral sclerosis, periarticular osteophytes and joint space narrowing by imaging studies. This condition presents safety issues increasing the risk of falls. There is no current active infection.  Patient Active Problem List   Diagnosis Date Noted  . Osteoporosis 03/03/2017  . Decreased libido 10/19/2016  . Spinal stenosis of lumbar region 08/26/2016  . Spinal stenosis at L4-L5 level 08/26/2016  . 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 Diarrhea 09/23/2015  . Bell's palsy 03/02/2015  . Seronegative rheumatoid arthritis ([place]) 03/02/2015  . Neck pain 04/26/2014  . Herpes zoster 04/05/2014  . Thrush, oral 03/06/2014  . Left arm swelling 01/15/2013  . Cough 09/19/2012  . Insomnia 09/01/2012  . CTS (carpal tunnel syndrome) 04/15/2012  . Arthritis of multiple sites 03/04/2012  . Well adult exam 01/27/2012  . Knee pain, bilateral 01/08/2012  . Hip pain, right 12/26/2010  . DISCITIS  07/25/2010  . SWEATING 05/23/2010  . NECK PAIN 05/20/2010  . PARESTHESIA, HANDS 04/25/2010  . COPD mixed type ([employer]) 07/26/2009  . INSOMNIA, PERSISTENT 08/01/2008  . SINUSITIS, ACUTE 08/01/2008  . RASH AND OTHER NONSPECIFIC SKIN ERUPTION 08/01/2008  . TOBACCO USE DISORDER/SMOKER-SMOKING CESSATION DISCUSSED 03/08/2008  . BRONCHITIS, ACUTE 03/08/2008  . HYPERLIPIDEMIA 02/27/2008  . CRAMP OF LIMB 02/27/2008  . FATIGUE 02/27/2008  . Wheezing 02/27/2008  . Backache 01/23/2008  . MENINGITIS 08/11/2007  . GERD 08/11/2007  . HEADACHE 08/11/2007  . OSTEOARTHRITIS 01/11/2007   Past Medical History:  Diagnosis Date  . Arthritis   . COPD (chronic obstructive pulmonary disease) ([place])   . GERD (gastroesophageal reflux disease)   . Neuromuscular disorder ([place])    bELLS PALSY  2 YEARS AGO    Past Surgical History:  Procedure Laterality Date  . ABDOMINAL HYSTERECTOMY     partial  . APPENDECTOMY    . CHOLECYSTECTOMY    . KNEE ARTHROSCOPY  1999   Dr Theda Sers -- R knee  . LUMBAR LAMINECTOMY/DECOMPRESSION MICRODISCECTOMY Right 08/26/2016   Procedure: Microlumbar decompression L4-5, L5-S1 on Right;  Surgeon: Susa Day, MD;  Location: [facility];  Service: Orthopedics;  Laterality: Right;     (Not in a hospital admission) Allergies  Allergen Reactions  . Aspirin     REACTION: upset stomach  . Codeine Sulfate Other (See Comments)    "drugged out feeling" & hallucinations.  . Lipitor [Atorvastatin]     cramps  . Nabumetone Other (See Comments)    Unsure of reaction (reaction occurred sometime ago)    Social History  Substance Use Topics  . Smoking status: Current Every Day Smoker  Packs/day: 0.50    Years: 58.00    Types: Cigarettes  . Smokeless tobacco: Never Used  . Alcohol use No    Family History  Problem Relation Age of Onset  . Heart disease Mother 44       CHF  . COPD Father 44       emphesema     Review of Systems  Constitutional: Negative.   HENT: Negative.    Eyes: Negative.   Respiratory: Negative.   Cardiovascular: Negative.   Gastrointestinal: Negative.   Genitourinary: Negative.   Musculoskeletal: Positive for joint pain.  Skin: Negative.   Neurological: Negative.   Endo/Heme/Allergies: Negative.   Psychiatric/Behavioral: Negative.     Objective:  Physical Exam  Vitals reviewed. Constitutional: She is oriented to person, place, and time. She appears well-developed and well-nourished.  HENT:  Head: Normocephalic and atraumatic.  Eyes: Pupils are equal, round, and reactive to light. Conjunctivae and EOM are normal.  Neck: Normal range of motion. Neck supple.  Cardiovascular: Normal rate, regular rhythm and intact distal pulses.   Respiratory: Effort normal. No respiratory distress.  GI: Soft. She exhibits no distension.  Genitourinary:  Genitourinary Comments: deferred  Musculoskeletal:       Right hip: She exhibits decreased range of motion and bony tenderness.  Neurological: She is alert and oriented to person, place, and time. She has normal reflexes.  Skin: Skin is warm and dry.  Psychiatric: She has a normal mood and affect. Her behavior is normal. Judgment and thought content normal.    Vital signs in last 24 hours: @VSRANGES @  Labs:   Estimated body mass index is 23.63 kg/m as calculated from the following:   Height as of 03/03/17: 5' (1.524 m).   Weight as of 03/03/17: 54.9 kg (121 lb).   Imaging Review Plain radiographs demonstrate severe degenerative joint disease of the right hip(s). The bone quality appears to be adequate for age and reported activity level.  Assessment/Plan:  End stage arthritis, right hip(s)  The patient history, physical examination, clinical judgement of the provider and imaging studies are consistent with end stage degenerative joint disease of the right hip(s) and total hip arthroplasty is deemed medically necessary. The treatment options including medical management, injection  therapy, arthroscopy and arthroplasty were discussed at length. The risks and benefits of total hip arthroplasty were presented and reviewed. The risks due to aseptic loosening, infection, stiffness, dislocation/subluxation,  thromboembolic complications and other imponderables were discussed.  The patient acknowledged the explanation, agreed to proceed with the plan and consent was signed. Patient is being admitted for inpatient treatment for surgery, pain control, PT, OT, prophylactic antibiotics, VTE prophylaxis, progressive ambulation and ADL's and discharge planning.The patient is planning to be discharged home with HEP

## 2017-03-22 ENCOUNTER — Other Ambulatory Visit (HOSPITAL_COMMUNITY): Payer: Self-pay | Admitting: Emergency Medicine

## 2017-03-22 IMAGING — DX DG SPINE 1V PORT
1 series · 1 of 1 positions shown · non-contrast
Comparison: Intraoperative film from earlier the same day.

CLINICAL DATA: L4-5 and possible L5-S1 decompression.

EXAM:
PORTABLE SPINE - 1 VIEW

[l-spine lat]
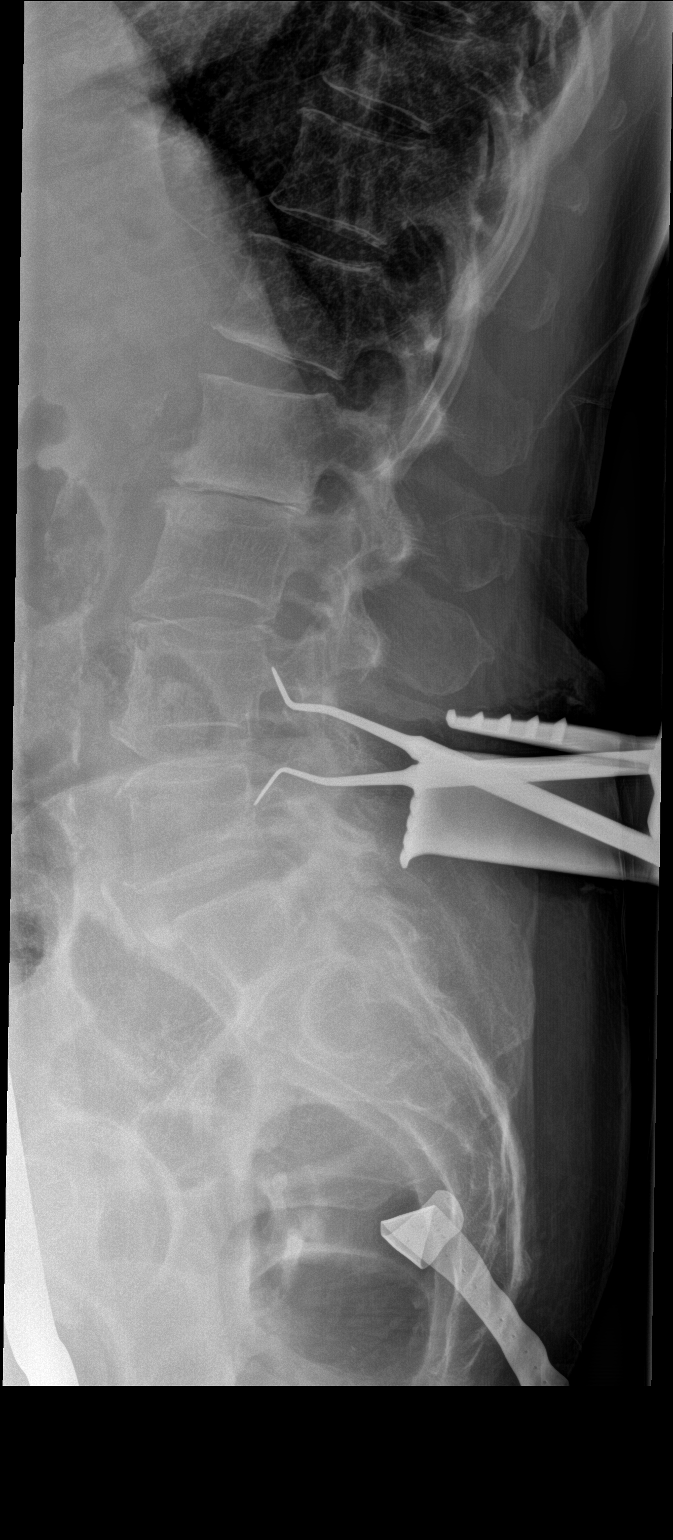

[1 of 1 positions shown; findings below may reference images not displayed]

FINDINGS: Portable cross-table lateral film labeled 3 was obtained at 2268
hours. Same numbering scheme used on this study as on the
immediately prior intraoperative film. Soft tissue retractors are
identified in the lower back. Radiopaque surgical probes are
overlying the lumbar canal. The caudal most probe tip is positioned
over the L5 pedicle, just posterior to the vertebral body. The more
cranially positioned tip overlies the L4 pedicle just posterior to
the L4 vertebral body.
IMPRESSION: Intraoperative localization.

## 2017-03-22 IMAGING — DX DG SPINE 1V PORT
1 series · 1 of 1 positions shown · non-contrast
Comparison: Study obtained earlier in the day

CLINICAL DATA: Lumbar decompression

EXAM:
PORTABLE SPINE - 1 VIEW

[l-spine lat]
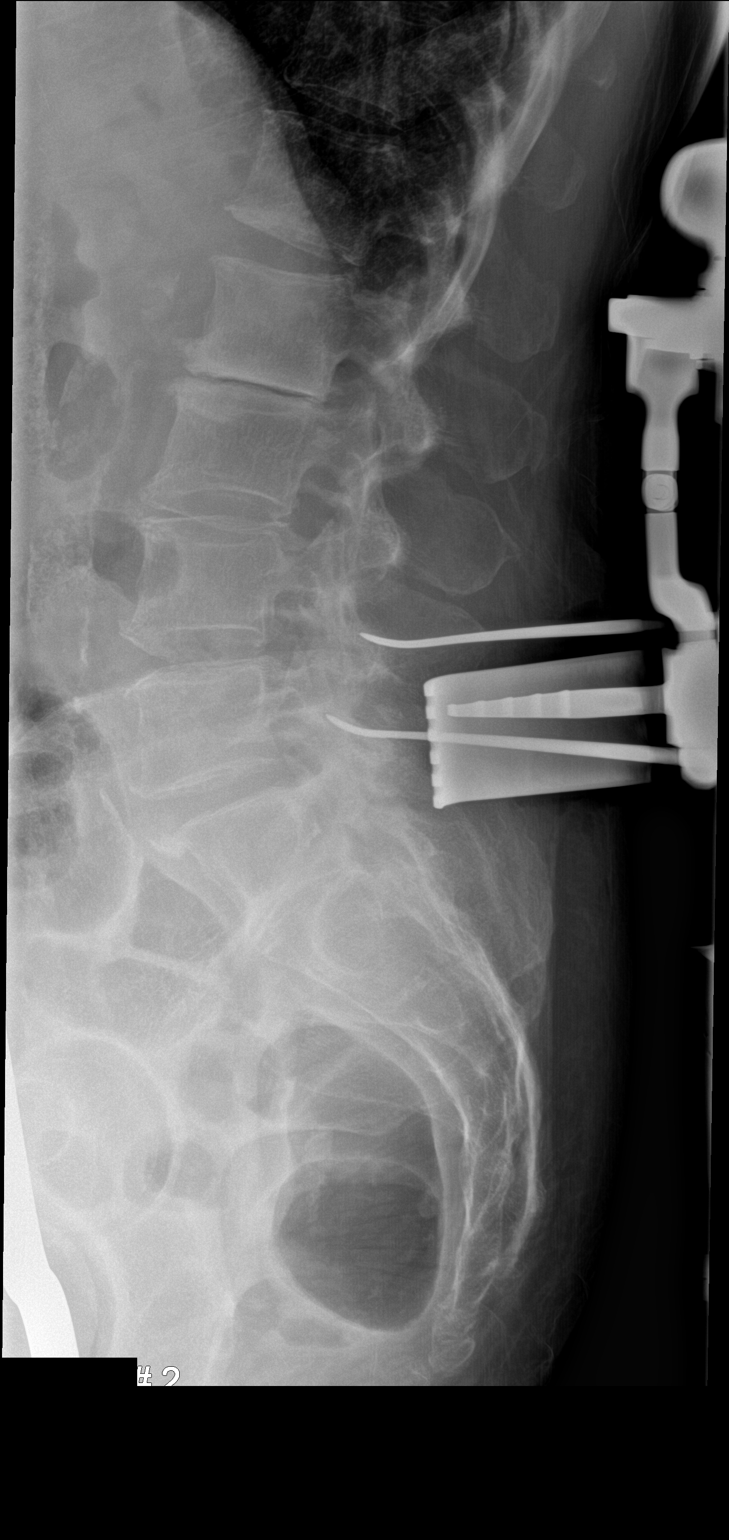

[1 of 1 positions shown; findings below may reference images not displayed]

FINDINGS: Cross-table lateral lumbar image labeled #2 submitted. Metallic
probe tips are posterior to the inferior aspect of the L4 vertebral
body and posterior to the midportion of the L5 vertebral body
respectively. Cutting tool overlies the L5 spinous process. There is
moderately severe disc space narrowing at L2-3 with vacuum
phenomenon at this level. There is moderate disc space narrowing at
L3-4 and L4-5. No fracture or spondylolisthesis. There is aortic
atherosclerosis.
IMPRESSION: Metallic probes with tips posterior to the inferior aspect of the L4
vertebral body and midportion of the L5 vertebral body respectively.
Areas of osteoarthritic change, most marked at L2-3. There is aortic
atherosclerosis.

## 2017-03-22 NOTE — Progress Notes (Signed)
Clearance Dr Alain Marion on chart 03-03-17   LOV Dr Alain Marion 03-03-17 epic

## 2017-03-22 NOTE — Patient Instructions (Signed)
Debra Farmer  03/22/2017   Your procedure is scheduled on: 04-01-17  Report to [facility] Main  Entrance [place]  elevators to 3rd floor to  [place] at [phone].    Call this number if you have problems the morning of surgery [phone]    Remember: ONLY 1 PERSON MAY GO WITH YOU TO SHORT STAY TO GET  READY MORNING OF YOUR SURGERY.  Do not eat food After Midnight. You may have clear liquids from midnight until 715am day of surgery. Nothing by mouth after 715am!     Take these medicines the morning of surgery with A SIP OF WATER: tylenol if  needed, tramadol if needed, inhaler (may bring to hospital)                                 You may not have any metal on your body including hair pins and              piercings  Do not wear jewelry, make-up, lotions, powders or perfumes, deodorant             Do not wear nail polish.  Do not shave  48 hours prior to surgery.               Do not bring valuables to the hospital. [place].  Contacts, dentures or bridgework may not be worn into surgery.  Leave suitcase in the car. After surgery it may be brought to your room.                Please read over the following fact sheets you were given: _____________________________________________________________________    CLEAR LIQUID DIET   Foods Allowed                                                                     Foods Excluded  Coffee and tea, regular and decaf                             liquids that you cannot  Plain Jell-O in any flavor                                             see through such as: Fruit ices (not with fruit pulp)                                     milk, soups, orange juice  Iced Popsicles                                    All solid food Carbonated beverages, regular and diet  Cranberry, grape and apple juices Sports drinks like  Gatorade Lightly seasoned clear broth or consume(fat free) Sugar, honey syrup  Sample Menu Breakfast                                Lunch                                     Supper Cranberry juice                    Beef broth                            Chicken broth Jell-O                                     Grape juice                           Apple juice Coffee or tea                        Jell-O                                      Popsicle                                                Coffee or tea                        Coffee or tea  _____________________________________________________________________  [facility] - Preparing for Surgery Before surgery, you can play an important role.  Because skin is not sterile, your skin needs to be as free of germs as possible.  You can reduce the number of germs on your skin by washing with CHG (chlorahexidine gluconate) soap before surgery.  CHG is an antiseptic cleaner which kills germs and bonds with the skin to continue killing germs even after washing. Please DO NOT use if you have an allergy to CHG or antibacterial soaps.  If your skin becomes reddened/irritated stop using the CHG and inform your nurse when you arrive at Short Stay. Do not shave (including legs and underarms) for at least 48 hours prior to the first CHG shower.  You may shave your face/neck. Please follow these instructions carefully:  1.  Shower with CHG Soap the night before surgery and the  morning of Surgery.  2.  If you choose to wash your hair, wash your hair first as usual with your  normal  shampoo.  3.  After you shampoo, rinse your hair and body thoroughly to remove the  shampoo.                           4.  Use CHG as you would any other liquid soap.  You can apply chg directly  to the skin and wash  Gently with a scrungie or clean washcloth.  5.  Apply the CHG Soap to your body ONLY FROM THE NECK DOWN.   Do not use on face/ open                            Wound or open sores. Avoid contact with eyes, ears mouth and genitals (private parts).                       Wash face,  Genitals (private parts) with your normal soap.             6.  Wash thoroughly, paying special attention to the area where your surgery  will be performed.  7.  Thoroughly rinse your body with warm water from the neck down.  8.  DO NOT shower/wash with your normal soap after using and rinsing off  the CHG Soap.                9.  Pat yourself dry with a clean towel.            10.  Wear clean pajamas.            11.  Place clean sheets on your bed the night of your first shower and do not  sleep with pets. Day of Surgery : Do not apply any lotions/deodorants the morning of surgery.  Please wear clean clothes to the hospital/surgery center.  FAILURE TO FOLLOW THESE INSTRUCTIONS MAY RESULT IN THE CANCELLATION OF YOUR SURGERY PATIENT SIGNATURE_________________________________  NURSE SIGNATURE__________________________________  ________________________________________________________________________   Debra Farmer  An incentive spirometer is a tool that can help keep your lungs clear and active. This tool measures how well you are filling your lungs with each breath. Taking long deep breaths may help reverse or decrease the chance of developing breathing (pulmonary) problems (especially infection) following:  A long period of time when you are unable to move or be active. BEFORE THE PROCEDURE   If the spirometer includes an indicator to show your best effort, your nurse or respiratory therapist will set it to a desired goal.  If possible, sit up straight or lean slightly forward. Try not to slouch.  Hold the incentive spirometer in an upright position. INSTRUCTIONS FOR USE  1. Sit on the edge of your bed if possible, or sit up as far as you can in bed or on a chair. 2. Hold the incentive spirometer in an upright position. 3. Breathe out  normally. 4. Place the mouthpiece in your mouth and seal your lips tightly around it. 5. Breathe in slowly and as deeply as possible, raising the piston or the ball toward the top of the column. 6. Hold your breath for 3-5 seconds or for as long as possible. Allow the piston or ball to fall to the bottom of the column. 7. Remove the mouthpiece from your mouth and breathe out normally. 8. Rest for a few seconds and repeat Steps 1 through 7 at least 10 times every 1-2 hours when you are awake. Take your time and take a few normal breaths between deep breaths. 9. The spirometer may include an indicator to show your best effort. Use the indicator as a goal to work toward during each repetition. 10. After each set of 10 deep breaths, practice coughing to be sure your lungs are clear. If you have an incision (the cut made at the time of  surgery), support your incision when coughing by placing a pillow or rolled up towels firmly against it. Once you are able to get out of bed, walk around indoors and cough well. You may stop using the incentive spirometer when instructed by your caregiver.  RISKS AND COMPLICATIONS  Take your time so you do not get dizzy or light-headed.  If you are in pain, you may need to take or ask for pain medication before doing incentive spirometry. It is harder to take a deep breath if you are having pain. AFTER USE  Rest and breathe slowly and easily.  It can be helpful to keep track of a log of your progress. Your caregiver can provide you with a simple table to help with this. If you are using the spirometer at home, follow these instructions: Debra Farmer IF:   You are having difficultly using the spirometer.  You have trouble using the spirometer as often as instructed.  Your pain medication is not giving enough relief while using the spirometer.  You develop fever of 100.5 F (38.1 C) or higher. SEEK IMMEDIATE MEDICAL CARE IF:   You cough up bloody sputum  that had not been present before.  You develop fever of 102 F (38.9 C) or greater.  You develop worsening pain at or near the incision site. MAKE SURE YOU:   Understand these instructions.  Will watch your condition.  Will get help right away if you are not doing well or get worse. Document Released: 10/12/2006 Document Revised: 08/24/2011 Document Reviewed: 12/13/2006 Exit[employer] Patient Information 2014 [employer], [place].   ________________________________________________________________________  WHAT IS A BLOOD TRANSFUSION? Blood Transfusion Information  A transfusion is the replacement of blood or some of its parts. Blood is made up of multiple cells which provide different functions.  Red blood cells carry oxygen and are used for blood loss replacement.  White blood cells fight against infection.  Platelets control bleeding.  Plasma helps clot blood.  Other blood products are available for specialized needs, such as hemophilia or other clotting disorders. BEFORE THE TRANSFUSION  Who gives blood for transfusions?   Healthy volunteers who are fully evaluated to make sure their blood is safe. This is blood bank blood. Transfusion therapy is the safest it has ever been in the practice of medicine. Before blood is taken from a donor, a complete history is taken to make sure that person has no history of diseases nor engages in risky social behavior (examples are intravenous drug use or sexual activity with multiple partners). The donor's travel history is screened to minimize risk of transmitting infections, such as malaria. The donated blood is tested for signs of infectious diseases, such as HIV and hepatitis. The blood is then tested to be sure it is compatible with you in order to minimize the chance of a transfusion reaction. If you or a relative donates blood, this is often done in anticipation of surgery and is not appropriate for emergency situations. It takes many days to  process the donated blood. RISKS AND COMPLICATIONS Although transfusion therapy is very safe and saves many lives, the main dangers of transfusion include:   Getting an infectious disease.  Developing a transfusion reaction. This is an allergic reaction to something in the blood you were given. Every precaution is taken to prevent this. The decision to have a blood transfusion has been considered carefully by your caregiver before blood is given. Blood is not given unless the benefits outweigh the risks. AFTER THE  TRANSFUSION  Right after receiving a blood transfusion, you will usually feel much better and more energetic. This is especially true if your red blood cells have gotten low (anemic). The transfusion raises the level of the red blood cells which carry oxygen, and this usually causes an energy increase.  The nurse administering the transfusion will monitor you carefully for complications. HOME CARE INSTRUCTIONS  No special instructions are needed after a transfusion. You may find your energy is better. Speak with your caregiver about any limitations on activity for underlying diseases you may have. SEEK MEDICAL CARE IF:   Your condition is not improving after your transfusion.  You develop redness or irritation at the intravenous (IV) site. SEEK IMMEDIATE MEDICAL CARE IF:  Any of the following symptoms occur over the next 12 hours:  Shaking chills.  You have a temperature by mouth above 102 F (38.9 C), not controlled by medicine.  Chest, back, or muscle pain.  People around you feel you are not acting correctly or are confused.  Shortness of breath or difficulty breathing.  Dizziness and fainting.  You get a rash or develop hives.  You have a decrease in urine output.  Your urine turns a dark color or changes to pink, red, or brown. Any of the following symptoms occur over the next 10 days:  You have a temperature by mouth above 102 F (38.9 C), not controlled by  medicine.  Shortness of breath.  Weakness after normal activity.  The white part of the eye turns yellow (jaundice).  You have a decrease in the amount of urine or are urinating less often.  Your urine turns a dark color or changes to pink, red, or brown. Document Released: 05/29/2000 Document Revised: 08/24/2011 Document Reviewed: 01/16/2008 [facility] Patient Information 2014 [place], [place].  _______________________________________________________________________

## 2017-03-23 ENCOUNTER — Encounter (HOSPITAL_COMMUNITY): Payer: Self-pay

## 2017-03-23 ENCOUNTER — Encounter (HOSPITAL_COMMUNITY)
Admission: RE | Admit: 2017-03-23 | Discharge: 2017-03-23 | Disposition: A | Discharge status: Home / Self Care | Payer: Managed Care, Other (non HMO) | Source: Ambulatory Visit | Attending: Orthopedic Surgery | Admitting: Orthopedic Surgery

## 2017-03-23 DIAGNOSIS — M1990 Unspecified osteoarthritis, unspecified site: Secondary | ICD-10-CM | POA: Diagnosis not present

## 2017-03-23 DIAGNOSIS — F17200 Nicotine dependence, unspecified, uncomplicated: Secondary | ICD-10-CM | POA: Diagnosis not present

## 2017-03-23 DIAGNOSIS — J449 Chronic obstructive pulmonary disease, unspecified: Secondary | ICD-10-CM | POA: Insufficient documentation

## 2017-03-23 DIAGNOSIS — M25561 Pain in right knee: Secondary | ICD-10-CM | POA: Insufficient documentation

## 2017-03-23 DIAGNOSIS — G039 Meningitis, unspecified: Secondary | ICD-10-CM | POA: Diagnosis not present

## 2017-03-23 DIAGNOSIS — M542 Cervicalgia: Secondary | ICD-10-CM | POA: Diagnosis not present

## 2017-03-23 DIAGNOSIS — E785 Hyperlipidemia, unspecified: Secondary | ICD-10-CM | POA: Insufficient documentation

## 2017-03-23 DIAGNOSIS — M810 Age-related osteoporosis without current pathological fracture: Secondary | ICD-10-CM | POA: Diagnosis not present

## 2017-03-23 DIAGNOSIS — Z01812 Encounter for preprocedural laboratory examination: Secondary | ICD-10-CM | POA: Insufficient documentation

## 2017-03-23 DIAGNOSIS — M48061 Spinal stenosis, lumbar region without neurogenic claudication: Secondary | ICD-10-CM | POA: Insufficient documentation

## 2017-03-23 DIAGNOSIS — M7989 Other specified soft tissue disorders: Secondary | ICD-10-CM | POA: Insufficient documentation

## 2017-03-23 DIAGNOSIS — G4700 Insomnia, unspecified: Secondary | ICD-10-CM | POA: Diagnosis not present

## 2017-03-23 DIAGNOSIS — M25562 Pain in left knee: Secondary | ICD-10-CM | POA: Diagnosis not present

## 2017-03-23 DIAGNOSIS — R252 Cramp and spasm: Secondary | ICD-10-CM | POA: Insufficient documentation

## 2017-03-23 LAB — BASIC METABOLIC PANEL
ANION GAP: 8 (ref 5–15)
BUN: 14 mg/dL (ref 6–20)
CALCIUM: 9.8 mg/dL (ref 8.9–10.3)
CO2: 27 mmol/L (ref 22–32)
CREATININE: 0.64 mg/dL (ref 0.44–1.00)
Chloride: 102 mmol/L (ref 101–111)
Glucose, Bld: 99 mg/dL (ref 65–99)
Potassium: 4.4 mmol/L (ref 3.5–5.1)
SODIUM: 137 mmol/L (ref 135–145)

## 2017-03-23 LAB — CBC
HEMATOCRIT: 45.5 % (ref 36.0–46.0)
Hemoglobin: 15.1 g/dL — ABNORMAL HIGH (ref 12.0–15.0)
MCH: 29.7 pg (ref 26.0–34.0)
MCHC: 33.2 g/dL (ref 30.0–36.0)
MCV: 89.6 fL (ref 78.0–100.0)
PLATELETS: 295 10*3/uL (ref 150–400)
RBC: 5.08 MIL/uL (ref 3.87–5.11)
RDW: 13.7 % (ref 11.5–15.5)
WBC: 9.9 10*3/uL (ref 4.0–10.5)

## 2017-03-23 LAB — ABO/RH: ABO/RH(D): O POS

## 2017-03-23 LAB — SURGICAL PCR SCREEN
MRSA, PCR: NEGATIVE
Staphylococcus aureus: NEGATIVE

## 2017-03-25 NOTE — Progress Notes (Signed)
Spoke with patient to notify of surgery time change. Patient aware to check in at short stay 3rd floor at 930am; now nothing by mouth after midnight. Patient verbalized understanding.

## 2017-04-01 ENCOUNTER — Encounter (HOSPITAL_COMMUNITY): Payer: Self-pay | Admitting: *Deleted

## 2017-04-01 ENCOUNTER — Inpatient Hospital Stay (HOSPITAL_COMMUNITY)
Admission: RE | Admit: 2017-04-01 | Discharge: 2017-04-02 | DRG: 470 | Disposition: A | Discharge status: Home / Self Care | Payer: Managed Care, Other (non HMO) | Source: Ambulatory Visit | Attending: Orthopedic Surgery | Admitting: Orthopedic Surgery

## 2017-04-01 ENCOUNTER — Inpatient Hospital Stay (HOSPITAL_COMMUNITY): Payer: Managed Care, Other (non HMO) | Admitting: Certified Registered Nurse Anesthetist

## 2017-04-01 ENCOUNTER — Encounter (HOSPITAL_COMMUNITY)
Admission: RE | Disposition: A | Discharge status: Home / Self Care | Payer: Self-pay | Source: Ambulatory Visit | Attending: Orthopedic Surgery

## 2017-04-01 ENCOUNTER — Inpatient Hospital Stay (HOSPITAL_COMMUNITY): Payer: Managed Care, Other (non HMO)

## 2017-04-01 DIAGNOSIS — M1611 Unilateral primary osteoarthritis, right hip: Principal | ICD-10-CM | POA: Diagnosis present

## 2017-04-01 DIAGNOSIS — Z8249 Family history of ischemic heart disease and other diseases of the circulatory system: Secondary | ICD-10-CM | POA: Diagnosis not present

## 2017-04-01 DIAGNOSIS — F17210 Nicotine dependence, cigarettes, uncomplicated: Secondary | ICD-10-CM | POA: Diagnosis present

## 2017-04-01 DIAGNOSIS — M25551 Pain in right hip: Secondary | ICD-10-CM | POA: Diagnosis not present

## 2017-04-01 DIAGNOSIS — Z90710 Acquired absence of both cervix and uterus: Secondary | ICD-10-CM | POA: Diagnosis not present

## 2017-04-01 DIAGNOSIS — Z885 Allergy status to narcotic agent status: Secondary | ICD-10-CM | POA: Diagnosis not present

## 2017-04-01 DIAGNOSIS — Z888 Allergy status to other drugs, medicaments and biological substances status: Secondary | ICD-10-CM | POA: Diagnosis not present

## 2017-04-01 DIAGNOSIS — Z825 Family history of asthma and other chronic lower respiratory diseases: Secondary | ICD-10-CM | POA: Diagnosis not present

## 2017-04-01 DIAGNOSIS — M810 Age-related osteoporosis without current pathological fracture: Secondary | ICD-10-CM | POA: Diagnosis present

## 2017-04-01 DIAGNOSIS — J449 Chronic obstructive pulmonary disease, unspecified: Secondary | ICD-10-CM | POA: Diagnosis present

## 2017-04-01 DIAGNOSIS — K219 Gastro-esophageal reflux disease without esophagitis: Secondary | ICD-10-CM | POA: Diagnosis present

## 2017-04-01 DIAGNOSIS — Z09 Encounter for follow-up examination after completed treatment for conditions other than malignant neoplasm: Secondary | ICD-10-CM

## 2017-04-01 DIAGNOSIS — Z886 Allergy status to analgesic agent status: Secondary | ICD-10-CM

## 2017-04-01 DIAGNOSIS — Z419 Encounter for procedure for purposes other than remedying health state, unspecified: Secondary | ICD-10-CM

## 2017-04-01 HISTORY — PX: TOTAL HIP ARTHROPLASTY: SHX124

## 2017-04-01 LAB — TYPE AND SCREEN
ABO/RH(D): O POS
ANTIBODY SCREEN: NEGATIVE

## 2017-04-01 SURGERY — ARTHROPLASTY, HIP, TOTAL, ANTERIOR APPROACH
Anesthesia: General | Site: Hip | Laterality: Right

## 2017-04-01 MED ORDER — METHOCARBAMOL 1000 MG/10ML IJ SOLN
500.0000 mg | Freq: Four times a day (QID) | INTRAVENOUS | Status: DC | PRN
  Administered 2017-04-01: 16:00:00 500 mg via INTRAVENOUS
  Filled 2017-04-01: qty 5

## 2017-04-01 MED ORDER — METOCLOPRAMIDE HCL 5 MG/ML IJ SOLN: 5.0000 mg | Freq: Three times a day (TID) | INTRAMUSCULAR | Status: DC | PRN

## 2017-04-01 MED ORDER — PHENYLEPHRINE 40 MCG/ML (10ML) SYRINGE FOR IV PUSH (FOR BLOOD PRESSURE SUPPORT)
PREFILLED_SYRINGE | INTRAVENOUS | Status: DC | PRN
  Administered 2017-04-01 (×3): 80 ug via INTRAVENOUS
  Administered 2017-04-01: 40 ug via INTRAVENOUS
  Administered 2017-04-01 (×5): 80 ug via INTRAVENOUS

## 2017-04-01 MED ORDER — PROPOFOL 10 MG/ML IV BOLUS
INTRAVENOUS | Status: AC
  Filled 2017-04-01: qty 60

## 2017-04-01 MED ORDER — SENNA 8.6 MG PO TABS
2.0000 | ORAL_TABLET | Freq: Every day | ORAL | Status: DC
  Filled 2017-04-01: qty 2

## 2017-04-01 MED ORDER — PHENYLEPHRINE 40 MCG/ML (10ML) SYRINGE FOR IV PUSH (FOR BLOOD PRESSURE SUPPORT)
PREFILLED_SYRINGE | INTRAVENOUS | Status: AC
  Filled 2017-04-01: qty 10

## 2017-04-01 MED ORDER — KETOROLAC TROMETHAMINE 30 MG/ML IJ SOLN
INTRAMUSCULAR | Status: AC
  Filled 2017-04-01: qty 1

## 2017-04-01 MED ORDER — PROPOFOL 500 MG/50ML IV EMUL
INTRAVENOUS | Status: DC | PRN
  Administered 2017-04-01: 50 ug/kg/min via INTRAVENOUS

## 2017-04-01 MED ORDER — DEXAMETHASONE SODIUM PHOSPHATE 10 MG/ML IJ SOLN
10.0000 mg | Freq: Once | INTRAMUSCULAR | Status: AC
  Administered 2017-04-02: 10 mg via INTRAVENOUS
  Filled 2017-04-01: qty 1

## 2017-04-01 MED ORDER — FENTANYL CITRATE (PF) 100 MCG/2ML IJ SOLN
INTRAMUSCULAR | Status: AC
  Filled 2017-04-01: qty 2

## 2017-04-01 MED ORDER — BUPIVACAINE-EPINEPHRINE 0.25% -1:200000 IJ SOLN
INTRAMUSCULAR | Status: DC | PRN
  Administered 2017-04-01: 30 mL

## 2017-04-01 MED ORDER — DIPHENHYDRAMINE HCL 12.5 MG/5ML PO ELIX: 12.5000 mg | ORAL_SOLUTION | ORAL | Status: DC | PRN

## 2017-04-01 MED ORDER — ASPIRIN 81 MG PO CHEW: 81.0000 mg | CHEWABLE_TABLET | Freq: Two times a day (BID) | ORAL | Status: DC

## 2017-04-01 MED ORDER — SODIUM CHLORIDE 0.9 % IV SOLN: INTRAVENOUS | Status: DC

## 2017-04-01 MED ORDER — HYDROMORPHONE HCL 1 MG/ML IJ SOLN: 0.5000 mg | INTRAMUSCULAR | Status: DC | PRN

## 2017-04-01 MED ORDER — CEFAZOLIN SODIUM-DEXTROSE 2-4 GM/100ML-% IV SOLN
2.0000 g | INTRAVENOUS | Status: AC
  Administered 2017-04-01: 2 g via INTRAVENOUS
  Filled 2017-04-01: qty 100

## 2017-04-01 MED ORDER — ASPIRIN EC 81 MG PO TBEC
81.0000 mg | DELAYED_RELEASE_TABLET | Freq: Two times a day (BID) | ORAL | Status: DC
  Administered 2017-04-01 – 2017-04-02 (×2): 81 mg via ORAL
  Filled 2017-04-01 (×3): qty 1

## 2017-04-01 MED ORDER — PROPOFOL 10 MG/ML IV BOLUS
INTRAVENOUS | Status: DC | PRN
  Administered 2017-04-01: 20 mg via INTRAVENOUS
  Administered 2017-04-01: 10 mg via INTRAVENOUS

## 2017-04-01 MED ORDER — SODIUM CHLORIDE 0.9 % IR SOLN
Status: DC | PRN
  Administered 2017-04-01: 1000 mL

## 2017-04-01 MED ORDER — ACETAMINOPHEN 325 MG PO TABS
650.0000 mg | ORAL_TABLET | ORAL | Status: DC | PRN
Start: 2017-04-01 — End: 2017-04-02

## 2017-04-01 MED ORDER — ISOPROPYL ALCOHOL 70 % SOLN
Status: DC | PRN
  Administered 2017-04-01: 1 via TOPICAL

## 2017-04-01 MED ORDER — POLYETHYLENE GLYCOL 3350 17 G PO PACK: 17.0000 g | PACK | Freq: Every day | ORAL | Status: DC | PRN

## 2017-04-01 MED ORDER — BUPIVACAINE-EPINEPHRINE (PF) 0.25% -1:200000 IJ SOLN
INTRAMUSCULAR | Status: AC
  Filled 2017-04-01: qty 30

## 2017-04-01 MED ORDER — HYDROCODONE-ACETAMINOPHEN 5-325 MG PO TABS
1.0000 | ORAL_TABLET | ORAL | Status: DC | PRN
  Administered 2017-04-01: 16:00:00 1 via ORAL

## 2017-04-01 MED ORDER — FENTANYL CITRATE (PF) 100 MCG/2ML IJ SOLN
INTRAMUSCULAR | Status: DC | PRN
  Administered 2017-04-01 (×2): 50 ug via INTRAVENOUS

## 2017-04-01 MED ORDER — LACTATED RINGERS IV SOLN
INTRAVENOUS | Status: DC
  Administered 2017-04-01 (×3): via INTRAVENOUS

## 2017-04-01 MED ORDER — PREDNISONE 1 MG PO TABS
4.0000 mg | ORAL_TABLET | Freq: Every day | ORAL | Status: DC
  Filled 2017-04-01: qty 4

## 2017-04-01 MED ORDER — CHLORHEXIDINE GLUCONATE 4 % EX LIQD: 60.0000 mL | Freq: Once | CUTANEOUS | Status: DC

## 2017-04-01 MED ORDER — ONDANSETRON HCL 4 MG/2ML IJ SOLN: 4.0000 mg | Freq: Four times a day (QID) | INTRAMUSCULAR | Status: DC | PRN

## 2017-04-01 MED ORDER — SODIUM CHLORIDE 0.9 % IJ SOLN
INTRAMUSCULAR | Status: AC
  Filled 2017-04-01: qty 50

## 2017-04-01 MED ORDER — IPRATROPIUM-ALBUTEROL 0.5-2.5 (3) MG/3ML IN SOLN
3.0000 mL | RESPIRATORY_TRACT | Status: DC | PRN
Start: 2017-04-01 — End: 2017-04-02

## 2017-04-01 MED ORDER — DEXAMETHASONE SODIUM PHOSPHATE 10 MG/ML IJ SOLN
INTRAMUSCULAR | Status: DC | PRN
  Administered 2017-04-01: 10 mg via INTRAVENOUS

## 2017-04-01 MED ORDER — DEXAMETHASONE SODIUM PHOSPHATE 10 MG/ML IJ SOLN
INTRAMUSCULAR | Status: AC
  Filled 2017-04-01: qty 1

## 2017-04-01 MED ORDER — HYDROMORPHONE HCL-NACL 0.5-0.9 MG/ML-% IV SOSY: 0.2500 mg | PREFILLED_SYRINGE | INTRAVENOUS | Status: DC | PRN

## 2017-04-01 MED ORDER — TRANEXAMIC ACID 1000 MG/10ML IV SOLN
1000.0000 mg | INTRAVENOUS | Status: AC
  Administered 2017-04-01: 1000 mg via INTRAVENOUS
  Filled 2017-04-01: qty 1100

## 2017-04-01 MED ORDER — ALUM & MAG HYDROXIDE-SIMETH 200-200-20 MG/5ML PO SUSP: 30.0000 mL | ORAL | Status: DC | PRN

## 2017-04-01 MED ORDER — ISOPROPYL ALCOHOL 70 % SOLN
Status: AC
  Filled 2017-04-01: qty 480

## 2017-04-01 MED ORDER — ONDANSETRON HCL 4 MG PO TABS: 4.0000 mg | ORAL_TABLET | Freq: Four times a day (QID) | ORAL | Status: DC | PRN

## 2017-04-01 MED ORDER — SODIUM CHLORIDE 0.9 % IJ SOLN
INTRAMUSCULAR | Status: DC | PRN
  Administered 2017-04-01: 30 mL

## 2017-04-01 MED ORDER — METOCLOPRAMIDE HCL 5 MG PO TABS: 5.0000 mg | ORAL_TABLET | Freq: Three times a day (TID) | ORAL | Status: DC | PRN

## 2017-04-01 MED ORDER — PHENOL 1.4 % MT LIQD: 1.0000 | OROMUCOSAL | Status: DC | PRN

## 2017-04-01 MED ORDER — ZOLPIDEM TARTRATE 5 MG PO TABS: 5.0000 mg | ORAL_TABLET | Freq: Every evening | ORAL | Status: DC | PRN

## 2017-04-01 MED ORDER — PROMETHAZINE HCL 25 MG/ML IJ SOLN: 6.2500 mg | INTRAMUSCULAR | Status: DC | PRN

## 2017-04-01 MED ORDER — DOCUSATE SODIUM 100 MG PO CAPS
100.0000 mg | ORAL_CAPSULE | Freq: Two times a day (BID) | ORAL | Status: DC
  Administered 2017-04-02: 09:00:00 100 mg via ORAL
  Filled 2017-04-01: qty 1

## 2017-04-01 MED ORDER — MIDAZOLAM HCL 2 MG/2ML IJ SOLN
INTRAMUSCULAR | Status: AC
  Filled 2017-04-01: qty 2

## 2017-04-01 MED ORDER — ACETAMINOPHEN 650 MG RE SUPP: 650.0000 mg | RECTAL | Status: DC | PRN

## 2017-04-01 MED ORDER — HYDROCODONE-ACETAMINOPHEN 5-325 MG PO TABS
2.0000 | ORAL_TABLET | ORAL | Status: DC | PRN
  Administered 2017-04-01: 22:00:00 2 via ORAL
  Administered 2017-04-01: 17:00:00 1 via ORAL
  Administered 2017-04-02 (×3): 2 via ORAL
  Filled 2017-04-01 (×5): qty 2

## 2017-04-01 MED ORDER — METHOCARBAMOL 500 MG PO TABS
500.0000 mg | ORAL_TABLET | Freq: Four times a day (QID) | ORAL | Status: DC | PRN
  Administered 2017-04-01: 22:00:00 500 mg via ORAL
  Filled 2017-04-01: qty 1

## 2017-04-01 MED ORDER — CEFAZOLIN SODIUM-DEXTROSE 2-4 GM/100ML-% IV SOLN
2.0000 g | Freq: Four times a day (QID) | INTRAVENOUS | Status: AC
  Administered 2017-04-01 – 2017-04-02 (×2): 2 g via INTRAVENOUS
  Filled 2017-04-01 (×2): qty 100

## 2017-04-01 MED ORDER — MIDAZOLAM HCL 5 MG/5ML IJ SOLN
INTRAMUSCULAR | Status: DC | PRN
  Administered 2017-04-01 (×2): 1 mg via INTRAVENOUS

## 2017-04-01 MED ORDER — SODIUM CHLORIDE 0.9 % IV SOLN
INTRAVENOUS | Status: DC
  Administered 2017-04-01 – 2017-04-02 (×3): via INTRAVENOUS

## 2017-04-01 MED ORDER — ONDANSETRON HCL 4 MG/2ML IJ SOLN
INTRAMUSCULAR | Status: AC
  Filled 2017-04-01: qty 2

## 2017-04-01 MED ORDER — ACETAMINOPHEN 10 MG/ML IV SOLN
1000.0000 mg | INTRAVENOUS | Status: AC
  Administered 2017-04-01: 1000 mg via INTRAVENOUS
  Filled 2017-04-01: qty 100

## 2017-04-01 MED ORDER — ONDANSETRON HCL 4 MG/2ML IJ SOLN
INTRAMUSCULAR | Status: DC | PRN
  Administered 2017-04-01: 4 mg via INTRAVENOUS

## 2017-04-01 MED ORDER — BUPIVACAINE IN DEXTROSE 0.75-8.25 % IT SOLN
INTRATHECAL | Status: DC | PRN
  Administered 2017-04-01: 1.8 mL via INTRATHECAL

## 2017-04-01 MED ORDER — KETOROLAC TROMETHAMINE 30 MG/ML IJ SOLN
INTRAMUSCULAR | Status: DC | PRN
  Administered 2017-04-01: 30 mg

## 2017-04-01 MED ORDER — MENTHOL 3 MG MT LOZG: 1.0000 | LOZENGE | OROMUCOSAL | Status: DC | PRN

## 2017-04-01 SURGICAL SUPPLY — 47 items
ADH SKN CLS APL DERMABOND .7 (GAUZE/BANDAGES/DRESSINGS) ×1
BAG DECANTER FOR FLEXI CONT (MISCELLANEOUS) IMPLANT
BAG SPEC THK2 15X12 ZIP CLS (MISCELLANEOUS)
BAG ZIPLOCK 12X15 (MISCELLANEOUS) IMPLANT
CAPT HIP TOTAL 2 ×2 IMPLANT
CHLORAPREP W/TINT 26ML (MISCELLANEOUS) ×3 IMPLANT
COVER PERINEAL POST (MISCELLANEOUS) ×3 IMPLANT
COVER SURGICAL LIGHT HANDLE (MISCELLANEOUS) ×3 IMPLANT
DECANTER SPIKE VIAL GLASS SM (MISCELLANEOUS) ×3 IMPLANT
DERMABOND ADVANCED (GAUZE/BANDAGES/DRESSINGS) ×2
DERMABOND ADVANCED .7 DNX12 (GAUZE/BANDAGES/DRESSINGS) ×2 IMPLANT
DRAPE SHEET LG 3/4 BI-LAMINATE (DRAPES) ×9 IMPLANT
DRAPE STERI IOBAN 125X83 (DRAPES) ×3 IMPLANT
DRAPE U-SHAPE 47X51 STRL (DRAPES) ×6 IMPLANT
DRSG AQUACEL AG ADV 3.5X10 (GAUZE/BANDAGES/DRESSINGS) ×3 IMPLANT
ELECT PENCIL ROCKER SW 15FT (MISCELLANEOUS) ×3 IMPLANT
ELECT REM PT RETURN 15FT ADLT (MISCELLANEOUS) ×3 IMPLANT
GAUZE SPONGE 4X4 12PLY STRL (GAUZE/BANDAGES/DRESSINGS) ×3 IMPLANT
GLOVE BIO SURGEON STRL SZ8.5 (GLOVE) ×6 IMPLANT
GLOVE BIOGEL PI IND STRL 8.5 (GLOVE) ×1 IMPLANT
GLOVE BIOGEL PI INDICATOR 8.5 (GLOVE) ×2
GLOVE ECLIPSE 7.0 STRL STRAW (GLOVE) ×12 IMPLANT
GOWN SPEC L3 XXLG W/TWL (GOWN DISPOSABLE) ×3 IMPLANT
GOWN STRL REUS W/ TWL LRG LVL4 (GOWN DISPOSABLE) IMPLANT
GOWN STRL REUS W/TWL LRG LVL4 (GOWN DISPOSABLE) ×9
HANDPIECE INTERPULSE COAX TIP (DISPOSABLE) ×3
HOLDER FOLEY CATH W/STRAP (MISCELLANEOUS) ×3 IMPLANT
HOOD PEEL AWAY FLYTE STAYCOOL (MISCELLANEOUS) ×6 IMPLANT
MARKER SKIN DUAL TIP RULER LAB (MISCELLANEOUS) ×3 IMPLANT
NDL SPNL 18GX3.5 QUINCKE PK (NEEDLE) ×1 IMPLANT
NEEDLE SPNL 18GX3.5 QUINCKE PK (NEEDLE) ×3 IMPLANT
PACK ANTERIOR HIP CUSTOM (KITS) ×3 IMPLANT
SAW OSC TIP CART 19.5X105X1.3 (SAW) ×3 IMPLANT
SEALER BIPOLAR AQUA 6.0 (INSTRUMENTS) ×3 IMPLANT
SET HNDPC FAN SPRY TIP SCT (DISPOSABLE) ×1 IMPLANT
SUT ETHIBOND NAB CT1 #1 30IN (SUTURE) ×6 IMPLANT
SUT MNCRL AB 3-0 PS2 18 (SUTURE) ×3 IMPLANT
SUT MON AB 2-0 CT1 36 (SUTURE) ×6 IMPLANT
SUT STRATAFIX PDO 1 14 VIOLET (SUTURE) ×3
SUT STRATFX PDO 1 14 VIOLET (SUTURE) ×1
SUT VIC AB 2-0 CT1 27 (SUTURE) ×3
SUT VIC AB 2-0 CT1 TAPERPNT 27 (SUTURE) ×1 IMPLANT
SUTURE STRATFX PDO 1 14 VIOLET (SUTURE) ×1 IMPLANT
SYR 50ML LL SCALE MARK (SYRINGE) ×3 IMPLANT
TRAY FOLEY W/METER SILVER 16FR (SET/KITS/TRAYS/PACK) IMPLANT
WATER STERILE IRR 1500ML POUR (IV SOLUTION) ×3 IMPLANT
YANKAUER SUCT BULB TIP 10FT TU (MISCELLANEOUS) ×3 IMPLANT

## 2017-04-01 NOTE — H&P (View-Only) (Signed)
TOTAL HIP ADMISSION H&P  Patient is admitted for right total hip arthroplasty.  Subjective:  Chief Complaint: right hip pain  HPI: Debra Farmer, 71 y.o. female, has a history of pain and functional disability in the right hip(s) due to arthritis and patient has failed non-surgical conservative treatments for greater than 12 weeks to include NSAID's and/or analgesics, flexibility and strengthening excercises, use of assistive devices, weight reduction as appropriate and activity modification.  Onset of symptoms was gradual starting 2 years ago with rapidlly worsening course since that time.The patient noted no past surgery on the right hip(s).  Patient currently rates pain in the right hip at 10 out of 10 with activity. Patient has night pain, worsening of pain with activity and weight bearing, trendelenberg gait, pain that interfers with activities of daily living, pain with passive range of motion and crepitus. Patient has evidence of subchondral cysts, subchondral sclerosis, periarticular osteophytes and joint space narrowing by imaging studies. This condition presents safety issues increasing the risk of falls. There is no current active infection.  Patient Active Problem List   Diagnosis Date Noted  . Osteoporosis 03/03/2017  . Decreased libido 10/19/2016  . Spinal stenosis of lumbar region 08/26/2016  . Spinal stenosis at L4-L5 level 08/26/2016  . 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 Diarrhea 09/23/2015  . Bell's palsy 03/02/2015  . 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 Neck pain 04/26/2014  . Herpes zoster 04/05/2014  . Thrush, oral 03/06/2014  . Left arm swelling 01/15/2013  . Cough 09/19/2012  . Insomnia 09/01/2012  . CTS (carpal tunnel syndrome) 04/15/2012  . Arthritis of multiple sites 03/04/2012  . Well adult exam 01/27/2012  . Knee pain, bilateral 01/08/2012  . Hip pain, right 12/26/2010  . DISCITIS  07/25/2010  . SWEATING 05/23/2010  . NECK PAIN 05/20/2010  . PARESTHESIA, HANDS 04/25/2010  . COPD mixed type ([place]) 07/26/2009  . INSOMNIA, PERSISTENT 08/01/2008  . SINUSITIS, ACUTE 08/01/2008  . RASH AND OTHER NONSPECIFIC SKIN ERUPTION 08/01/2008  . TOBACCO USE DISORDER/SMOKER-SMOKING CESSATION DISCUSSED 03/08/2008  . BRONCHITIS, ACUTE 03/08/2008  . HYPERLIPIDEMIA 02/27/2008  . CRAMP OF LIMB 02/27/2008  . FATIGUE 02/27/2008  . Wheezing 02/27/2008  . Backache 01/23/2008  . MENINGITIS 08/11/2007  . GERD 08/11/2007  . HEADACHE 08/11/2007  . OSTEOARTHRITIS 01/11/2007   Past Medical History:  Diagnosis Date  . Arthritis   . COPD (chronic obstructive pulmonary disease) ([place])   . GERD (gastroesophageal reflux disease)   . Neuromuscular disorder ([place])    bELLS PALSY  2 YEARS AGO    Past Surgical History:  Procedure Laterality Date  . ABDOMINAL HYSTERECTOMY     partial  . APPENDECTOMY    . CHOLECYSTECTOMY    . KNEE ARTHROSCOPY  1999   Dr Theda Sers -- R knee  . LUMBAR LAMINECTOMY/DECOMPRESSION MICRODISCECTOMY Right 08/26/2016   Procedure: Microlumbar decompression L4-5, L5-S1 on Right;  Surgeon: Susa Day, MD;  Location: [facility] ORS;  Service: Orthopedics;  Laterality: Right;     (Not in a hospital admission) Allergies  Allergen Reactions  . Aspirin     REACTION: upset stomach  . Codeine Sulfate Other (See Comments)    "drugged out feeling" & hallucinations.  . Lipitor [Atorvastatin]     cramps  . Nabumetone Other (See Comments)    Unsure of reaction (reaction occurred sometime ago)    Social History  Substance Use Topics  . Smoking status: Current Every Day Smoker  Packs/day: 0.50    Years: 58.00    Types: Cigarettes  . Smokeless tobacco: Never Used  . Alcohol use No    Family History  Problem Relation Age of Onset  . Heart disease Mother 11       CHF  . COPD Father 59       emphesema     Review of Systems  Constitutional: Negative.   HENT: Negative.    Eyes: Negative.   Respiratory: Negative.   Cardiovascular: Negative.   Gastrointestinal: Negative.   Genitourinary: Negative.   Musculoskeletal: Positive for joint pain.  Skin: Negative.   Neurological: Negative.   Endo/Heme/Allergies: Negative.   Psychiatric/Behavioral: Negative.     Objective:  Physical Exam  Vitals reviewed. Constitutional: She is oriented to person, place, and time. She appears well-developed and well-nourished.  HENT:  Head: Normocephalic and atraumatic.  Eyes: Pupils are equal, round, and reactive to light. Conjunctivae and EOM are normal.  Neck: Normal range of motion. Neck supple.  Cardiovascular: Normal rate, regular rhythm and intact distal pulses.   Respiratory: Effort normal. No respiratory distress.  GI: Soft. She exhibits no distension.  Genitourinary:  Genitourinary Comments: deferred  Musculoskeletal:       Right hip: She exhibits decreased range of motion and bony tenderness.  Neurological: She is alert and oriented to person, place, and time. She has normal reflexes.  Skin: Skin is warm and dry.  Psychiatric: She has a normal mood and affect. Her behavior is normal. Judgment and thought content normal.    Vital signs in last 24 hours: @VSRANGES @  Labs:   Estimated body mass index is 23.63 kg/m as calculated from the following:   Height as of 03/03/17: 5' (1.524 m).   Weight as of 03/03/17: 54.9 kg (121 lb).   Imaging Review Plain radiographs demonstrate severe degenerative joint disease of the right hip(s). The bone quality appears to be adequate for age and reported activity level.  Assessment/Plan:  End stage arthritis, right hip(s)  The patient history, physical examination, clinical judgement of the provider and imaging studies are consistent with end stage degenerative joint disease of the right hip(s) and total hip arthroplasty is deemed medically necessary. The treatment options including medical management, injection  therapy, arthroscopy and arthroplasty were discussed at length. The risks and benefits of total hip arthroplasty were presented and reviewed. The risks due to aseptic loosening, infection, stiffness, dislocation/subluxation,  thromboembolic complications and other imponderables were discussed.  The patient acknowledged the explanation, agreed to proceed with the plan and consent was signed. Patient is being admitted for inpatient treatment for surgery, pain control, PT, OT, prophylactic antibiotics, VTE prophylaxis, progressive ambulation and ADL's and discharge planning.The patient is planning to be discharged home with HEP

## 2017-04-01 NOTE — Anesthesia Preprocedure Evaluation (Addendum)
Anesthesia Evaluation  Patient identified by MRN, date of birth, ID band Patient awake    Reviewed: Allergy & Precautions, NPO status , Patient's Chart, lab work & pertinent test results  History of Anesthesia Complications Negative for: history of anesthetic complications  Airway Mallampati: II  TM Distance: >3 FB Neck ROM: Full    Dental no notable dental hx. (+) Dental Advisory Given   Pulmonary COPD, Current Smoker,    breath sounds clear to auscultation (-) wheezing      Cardiovascular negative cardio ROS Normal cardiovascular exam Rhythm:Regular Rate:Normal     Neuro/Psych negative neurological ROS  negative psych ROS   GI/Hepatic Neg liver ROS, GERD  ,  Endo/Other  negative endocrine ROS  Renal/GU negative Renal ROS  negative genitourinary   Musculoskeletal negative musculoskeletal ROS (+)   Abdominal   Peds negative pediatric ROS (+)  Hematology negative hematology ROS (+)   Anesthesia Other Findings   Reproductive/Obstetrics negative OB ROS                            Anesthesia Physical  Anesthesia Plan  ASA: III  Anesthesia Plan: General   Post-op Pain Management:    Induction: Intravenous  PONV Risk Score and Plan: 2 and Ondansetron, Dexamethasone and Propofol infusion  Airway Management Planned: Natural Airway  Additional Equipment:   Intra-op Plan:   Post-operative Plan:   Informed Consent: I have reviewed the patients History and Physical, chart, labs and discussed the procedure including the risks, benefits and alternatives for the proposed anesthesia with the patient or authorized representative who has indicated his/her understanding and acceptance.   Dental advisory given  Plan Discussed with: Surgeon, CRNA and Anesthesiologist  Anesthesia Plan Comments:        Anesthesia Quick Evaluation

## 2017-04-01 NOTE — Anesthesia Postprocedure Evaluation (Signed)
Anesthesia Post Note  Patient: Debra Farmer  Procedure(s) Performed: RIGHT TOTAL HIP ARTHROPLASTY ANTERIOR APPROACH (Right Hip)     Patient location during evaluation: PACU Anesthesia Type: General Level of consciousness: awake and alert Pain management: pain level controlled Vital Signs Assessment: post-procedure vital signs reviewed and stable Respiratory status: spontaneous breathing, respiratory function stable and patient connected to nasal cannula oxygen Cardiovascular status: blood pressure returned to baseline and stable Postop Assessment: spinal receding Anesthetic complications: no    Last Vitals:  Vitals:   04/01/17 1500 04/01/17 1515  BP: (!) 102/47 101/64  Pulse: (!) 56 (!) 55  Resp: 12 12  Temp: 37.1 C   SpO2: 95% 95%    Last Pain:  Vitals:   04/01/17 1500  TempSrc:   PainSc: 0-No pain                 Taeden Geller,W. EDMOND

## 2017-04-01 NOTE — Transfer of Care (Signed)
Immediate Anesthesia Transfer of Care Note  Patient: Debra Farmer  Procedure(s) Performed: Procedure(s) with comments: RIGHT TOTAL HIP ARTHROPLASTY ANTERIOR APPROACH (Right) - Needs RNFA  Patient Location: PACU  Anesthesia Type:Spinal  Level of Consciousness:  alert, patient cooperative and responds to stimulation  Airway & Oxygen Therapy:Patient Spontanous Breathing and Patient connected to face mask oxgen  Post-op Assessment:  Report given to PACU RN and Post -op Vital signs reviewed and stable  Post vital signs:  Reviewed and stable  Last Vitals:  Vitals:   04/01/17 0919  BP: 137/65  Pulse: 73  Resp: 16  Temp: 36.7 C  SpO2: 82%    Complications: No apparent anesthesia complications

## 2017-04-01 NOTE — Discharge Instructions (Signed)
°Dr. Latonyia Lopata °Joint Replacement Specialist °[facility] °[address] °[place],  [address] °[phone] ° ° °TOTAL HIP REPLACEMENT POSTOPERATIVE DIRECTIONS ° ° ° °Hip Rehabilitation, Guidelines Following Surgery  ° °WEIGHT BEARING °Weight bearing as tolerated with assist device (walker, cane, etc) as directed, use it as long as suggested by your surgeon or therapist, typically at least 4-6 weeks. ° °The results of a hip operation are greatly improved after range of motion and muscle strengthening exercises. Follow all safety measures which are given to protect your hip. If any of these exercises cause increased pain or swelling in your joint, decrease the amount until you are comfortable again. Then slowly increase the exercises. Call your caregiver if you have problems or questions.  ° °HOME CARE INSTRUCTIONS  °Most of the following instructions are designed to prevent the dislocation of your new hip.  °Remove items at home which could result in a fall. This includes throw rugs or furniture in walking pathways.  °Continue medications as instructed at time of discharge. °· You may have some home medications which will be placed on hold until you complete the course of blood thinner medication. °· You may start showering once you are discharged home. Do not remove your dressing. °Do not put on socks or shoes without following the instructions of your caregivers.   °Sit on chairs with arms. Use the chair arms to help push yourself up when arising.  °Arrange for the use of a toilet seat elevator so you are not sitting low.  °· Walk with walker as instructed.  °You may resume a sexual relationship in one month or when given the OK by your caregiver.  °Use walker as long as suggested by your caregivers.  °You may put full weight on your legs and walk as much as is comfortable. °Avoid periods of inactivity such as sitting longer than an hour when not asleep. This helps prevent  blood clots.  °You may return to work once you are cleared by your surgeon.  °Do not drive a car for 6 weeks or until released by your surgeon.  °Do not drive while taking narcotics.  °Wear elastic stockings for two weeks following surgery during the day but you may remove then at night.  °Make sure you keep all of your appointments after your operation with all of your doctors and caregivers. You should call the office at the above phone number and make an appointment for approximately two weeks after the date of your surgery. °Please pick up a stool softener and laxative for home use as long as you are requiring pain medications. °· ICE to the affected hip every three hours for 30 minutes at a time and then as needed for pain and swelling. Continue to use ice on the hip for pain and swelling from surgery. You may notice swelling that will progress down to the foot and ankle.  This is normal after surgery.  Elevate the leg when you are not up walking on it.   °It is important for you to complete the blood thinner medication as prescribed by your doctor. °· Continue to use the breathing machine which will help keep your temperature down.  It is common for your temperature to cycle up and down following surgery, especially at night when you are not up moving around and exerting yourself.  The breathing machine keeps your lungs expanded and your temperature down. ° °RANGE OF MOTION AND STRENGTHENING EXERCISES  °These exercises are   designed to help you keep full movement of your hip joint. Follow your caregiver's or physical therapist's instructions. Perform all exercises about fifteen times, three times per day or as directed. Exercise both hips, even if you have had only one joint replacement. These exercises can be done on a training (exercise) mat, on the floor, on a table or on a bed. Use whatever works the best and is most comfortable for you. Use music or television while you are exercising so that the exercises  are a pleasant break in your day. This will make your life better with the exercises acting as a break in routine you can look forward to.  Lying on your back, slowly slide your foot toward your buttocks, raising your knee up off the floor. Then slowly slide your foot back down until your leg is straight again.  Lying on your back spread your legs as far apart as you can without causing discomfort.  Lying on your side, raise your upper leg and foot straight up from the floor as far as is comfortable. Slowly lower the leg and repeat.  Lying on your back, tighten up the muscle in the front of your thigh (quadriceps muscles). You can do this by keeping your leg straight and trying to raise your heel off the floor. This helps strengthen the largest muscle supporting your knee.  Lying on your back, tighten up the muscles of your buttocks both with the legs straight and with the knee bent at a comfortable angle while keeping your heel on the floor.   SKILLED REHAB INSTRUCTIONS: If the patient is transferred to a skilled rehab facility following release from the hospital, a list of the current medications will be sent to the facility for the patient to continue.  When discharged from the skilled rehab facility, please have the facility set up the patient's [place] prior to being released. Also, the skilled facility will be responsible for providing the patient with their medications at time of release from the facility to include their pain medication and their blood thinner medication. If the patient is still at the rehab facility at time of the two week follow up appointment, the skilled rehab facility will also need to assist the patient in arranging follow up appointment in our office and any transportation needs.  MAKE SURE YOU:  Understand these instructions.  Will watch your condition.  Will get help right away if you are not doing well or get worse.  Pick up stool softner and  laxative for home use following surgery while on pain medications. Do not remove your dressing. The dressing is waterproof--it is OK to take showers. Continue to use ice for pain and swelling after surgery. Do not use any lotions or creams on the incision until instructed by your surgeon. Total Hip Protocol. No smoking or nicotine products.

## 2017-04-01 NOTE — Progress Notes (Signed)
Portable AP Pelvis X-ray done. 

## 2017-04-01 NOTE — Progress Notes (Signed)
X-ray results noted 

## 2017-04-01 NOTE — Interval H&P Note (Signed)
History and Physical Interval Note:  04/01/2017 11:34 AM  Debra Farmer  has presented today for surgery, with the diagnosis of Degenerative joint disease right hip  The various methods of treatment have been discussed with the patient and family. After consideration of risks, benefits and other options for treatment, the patient has consented to  Procedure(s) with comments: RIGHT TOTAL HIP ARTHROPLASTY ANTERIOR APPROACH (Right) - Needs RNFA as a surgical intervention .  The patient's history has been reviewed, patient examined, no change in status, stable for surgery.  I have reviewed the patient's chart and labs.  Questions were answered to the patient's satisfaction.     Debra Farmer, Horald Pollen

## 2017-04-01 NOTE — Anesthesia Procedure Notes (Signed)
Spinal  Patient location during procedure: OR Start time: 04/01/2017 11:49 AM End time: 04/01/2017 11:53 AM Staffing Anesthesiologist: Duane Boston Resident/CRNA: Darlys Gales R Performed: resident/CRNA  Preanesthetic Checklist Completed: patient identified, site marked, surgical consent, pre-op evaluation, timeout performed, IV checked, risks and benefits discussed and monitors and equipment checked Spinal Block Patient position: sitting Prep: DuraPrep Patient monitoring: heart rate, cardiac monitor, continuous pulse ox and blood pressure Approach: midline Location: L3-4 Injection technique: single-shot Needle Needle type: Pencan  Needle gauge: 24 G Needle length: 10 cm Needle insertion depth: 7 cm Assessment Sensory level: T6

## 2017-04-01 NOTE — Op Note (Signed)
OPERATIVE REPORT  SURGEON: Rod Can, MD   ASSISTANT: Staff.  PREOPERATIVE DIAGNOSIS: Right hip arthritis.   POSTOPERATIVE DIAGNOSIS: Right hip arthritis.   PROCEDURE: Right total hip arthroplasty, anterior approach.   IMPLANTS: DePuy Tri Lock stem, size 3, std offset. DePuy Pinnacle Cup, size 50 mm. DePuy Altrx liner, size 32 by 50 mm, neutral. DePuy Biolox ceramic head ball, size 32 + 5 mm. 6.5 mm cancellous bone screws x2.   ANESTHESIA:  Spinal  ESTIMATED BLOOD LOSS:-150 mL    ANTIBIOTICS: 2 g Ancef.  DRAINS: None.  COMPLICATIONS: None.   CONDITION: PACU - hemodynamically stable.   BRIEF CLINICAL NOTE: Debra Farmer is a 71 y.o. female with a long-standing history of Right hip arthritis. After failing conservative management, the patient was indicated for total hip arthroplasty. The risks, benefits, and alternatives to the procedure were explained, and the patient elected to proceed.  PROCEDURE IN DETAIL: Surgical site was marked by myself in the pre-op holding area. Once inside the operating room, spinal anesthesia was obtained, and a foley catheter was inserted. The patient was then positioned on the Hana table. All bony prominences were well padded. The hip was prepped and draped in the normal sterile surgical fashion. A time-out was called verifying side and site of surgery. The patient received IV antibiotics within 60 minutes of beginning the procedure.  The direct anterior approach to the hip was performed through the Hueter interval. Lateral femoral circumflex vessels were treated with the Auqumantys. The anterior capsule was exposed and an inverted T capsulotomy was made.The femoral neck cut was made to the level of the templated cut. A corkscrew was placed into the head and the head was removed. The femoral head was found to have eburnated bone. The head was passed to the back table and was measured.  Acetabular exposure was achieved, and the  pulvinar and labrum were excised. Sequential reaming of the acetabulum was then performed up to a size 49 mm reamer. A 50 mm cup was then opened and impacted into place at approximately 40 degrees of abduction and 20 degrees of anteversion. I elected to augment the already acceptable press-fit fixation with 6.5 mm cancellus bone screws x2. The final polyethylene liner was impacted into place and acetabular osteophytes were removed.   I then gained femoral exposure taking care to protect the abductors and greater trochanter. This was performed using standard external rotation, extension, and adduction. The capsule was peeled off the inner aspect of the greater trochanter, taking care to preserve the short external rotators. A cookie cutter was used to enter the femoral canal, and then the femoral canal finder was placed. Sequential broaching was performed up to a size 3. Calcar planer was used on the femoral neck remnant. I placed a std offset neck and a trial head ball. The hip was reduced. Leg lengths and offset were checked fluoroscopically. The hip was dislocated and trial components were removed. The final implants were placed, and the hip was reduced.  Fluoroscopy was used to confirm component position and leg lengths. At 90 degrees of external rotation and full extension, the hip was stable to an anterior directed force.  The wound was copiously irrigated with normal saline using pulse lavage. Marcaine solution was injected into the periarticular soft tissue. The wound was closed in layers using #1 Vicryl and V-Loc for the fascia, 2-0 Vicryl for the subcutaneous fat, 2-0 Monocryl for the deep dermal layer, 3-0 running Monocryl subcuticular stitch, and Dermabond for the  skin. Once the glue was fully dried, an Aquacell Ag dressing was applied. The patient was transported to the recovery room in stable condition. Sponge, needle, and instrument counts were correct at the end of the case x2.  The patient tolerated the procedure well and there were no known complications.

## 2017-04-02 LAB — BASIC METABOLIC PANEL
Anion gap: 6 (ref 5–15)
BUN: 12 mg/dL (ref 6–20)
CHLORIDE: 105 mmol/L (ref 101–111)
CO2: 26 mmol/L (ref 22–32)
CREATININE: 0.62 mg/dL (ref 0.44–1.00)
Calcium: 8.2 mg/dL — ABNORMAL LOW (ref 8.9–10.3)
GFR calc non Af Amer: >60 mL/min (ref >60)
Glucose, Bld: 142 mg/dL — ABNORMAL HIGH (ref 65–99)
Potassium: 4.2 mmol/L (ref 3.5–5.1)
Sodium: 137 mmol/L (ref 135–145)

## 2017-04-02 LAB — CBC
HEMATOCRIT: 37.1 % (ref 36.0–46.0)
Hemoglobin: 12.2 g/dL (ref 12.0–15.0)
MCH: 29.5 pg (ref 26.0–34.0)
MCHC: 32.9 g/dL (ref 30.0–36.0)
MCV: 89.6 fL (ref 78.0–100.0)
Platelets: 228 10*3/uL (ref 150–400)
RBC: 4.14 MIL/uL (ref 3.87–5.11)
RDW: 13.5 % (ref 11.5–15.5)
WBC: 13.7 10*3/uL — ABNORMAL HIGH (ref 4.0–10.5)

## 2017-04-02 MED ORDER — ASPIRIN 81 MG PO TBEC: 81.0000 mg | DELAYED_RELEASE_TABLET | Freq: Two times a day (BID) | ORAL | 1 refills | Status: DC

## 2017-04-02 MED ORDER — HYDROCODONE-ACETAMINOPHEN 5-325 MG PO TABS: 1.0000 | ORAL_TABLET | Freq: Four times a day (QID) | ORAL | 0 refills | Status: DC | PRN

## 2017-04-02 MED ORDER — ONDANSETRON HCL 4 MG PO TABS: 4.0000 mg | ORAL_TABLET | Freq: Four times a day (QID) | ORAL | 0 refills | Status: DC | PRN

## 2017-04-02 MED ORDER — DOCUSATE SODIUM 100 MG PO CAPS: 100.0000 mg | ORAL_CAPSULE | Freq: Two times a day (BID) | ORAL | 1 refills | Status: DC

## 2017-04-02 MED ORDER — SENNA 8.6 MG PO TABS: 2.0000 | ORAL_TABLET | Freq: Every day | ORAL | 0 refills | Status: DC

## 2017-04-02 NOTE — Progress Notes (Signed)
Discharge plan: HEP, needs RW and 3n1, contacted [facility] to deliver to room. [phone]

## 2017-04-02 NOTE — Evaluation (Signed)
Physical Therapy Evaluation Patient Details Name: Debra Farmer MRN: [id number] DOB: Dec 21, 1945 Today's Date: 04/02/2017   History of Present Illness  s/p R THA  Clinical Impression  Pt is s/p THA resulting in the deficits listed below (see PT Problem List).  Pt will benefit from skilled PT to increase their independence and safety with mobility to allow discharge to the venue listed below.      Follow Up Recommendations DC plan and follow up therapy as arranged by surgeon    Equipment Recommendations  Rolling walker with 5" wheels (petite)    Recommendations for Other Services       Precautions / Restrictions Precautions Precautions: Fall Restrictions Weight Bearing Restrictions: No      Mobility  Bed Mobility               General bed mobility comments: OOB in chair  Transfers Overall transfer level: Needs assistance Equipment used: Rolling walker (2 wheeled) Transfers: Sit to/from Stand Sit to Stand: Supervision         General transfer comment: for safety, pt self cues  Ambulation/Gait Ambulation/Gait assistance: Supervision Ambulation Distance (Feet): 180 Feet Assistive device: Rolling walker (2 wheeled) Gait Pattern/deviations: Step-through pattern     General Gait Details: cues for RW position  Stairs            Wheelchair Mobility    Modified Rankin (Stroke Patients Only)       Balance                                             Pertinent Vitals/Pain Pain Assessment: 0-10 Pain Score: 2  Pain Location: right hip Pain Descriptors / Indicators: Discomfort Pain Intervention(s): Limited activity within patient's tolerance;Monitored during session    Home Living Family/patient expects to be discharged to:: Private residence Living Arrangements: Spouse/significant other Available Help at Discharge: Family Type of Home: House Home Access: Stairs to enter Entrance Stairs-Rails: Right;Left;Can reach  both Technical brewer of Steps: 3-4 Home Layout: One level Home Equipment: None      Prior Function                 Hand Dominance        Extremity/Trunk Assessment   Upper Extremity Assessment Upper Extremity Assessment: Defer to OT evaluation    Lower Extremity Assessment Lower Extremity Assessment: RLE deficits/detail RLE Deficits / Details: grossly 3+/5 to 4/5       Communication   Communication: No difficulties  Cognition Arousal/Alertness: Awake/alert Behavior During Therapy: WFL for tasks assessed/performed Overall Cognitive Status: Within Functional Limits for tasks assessed                                        General Comments      Exercises Total Joint Exercises Ankle Circles/Pumps: AROM;Both;10 reps Hip ABduction/ADduction: AROM;Strengthening;Both;10 reps;Standing Knee Flexion: AROM;Strengthening;Both;10 reps;Standing Marching in Standing: AROM;Strengthening;10 reps;Standing Standing Hip Extension: AROM;Strengthening;Both;10 reps;Standing   Assessment/Plan    PT Assessment Patient needs continued PT services  PT Problem List Decreased strength;Decreased knowledge of use of DME;Decreased mobility       PT Treatment Interventions Stair training;Gait training;DME instruction;Functional mobility training;Therapeutic exercise    PT Goals (Current goals can be found in the Care Plan section)  Acute Rehab PT Goals Patient  Stated Goal: home today PT Goal Formulation: With patient Time For Goal Achievement: 04/05/17 Potential to Achieve Goals: Good    Frequency 7X/week   Barriers to discharge        Co-evaluation               AM-PAC PT "6 Clicks" Daily Activity  Outcome Measure Difficulty turning over in bed (including adjusting bedclothes, sheets and blankets)?: A Little Difficulty moving from lying on back to sitting on the side of the bed? : A Little Difficulty sitting down on and standing up from a  chair with arms (e.g., wheelchair, bedside commode, etc,.)?: A Little Help needed moving to and from a bed to chair (including a wheelchair)?: A Little Help needed walking in hospital room?: A Little Help needed climbing 3-5 steps with a railing? : A Little 6 Click Score: 18    End of Session Equipment Utilized During Treatment: Gait belt Activity Tolerance: Patient tolerated treatment well Patient left: with call bell/phone within reach;in chair Nurse Communication: Mobility status PT Visit Diagnosis: Difficulty in walking, not elsewhere classified (R26.2)    Time: 0712-1975 PT Time Calculation (min) (ACUTE ONLY): 17 min   Charges:   PT Evaluation $PT Eval Low Complexity: 1 Low     PT G Codes:          Earnie Bechard 2017/04/27, 10:41 AM

## 2017-04-02 NOTE — Progress Notes (Signed)
RN reviewed discharge instructions with patient and family. All questions answered.   Paperwork and prescriptions given.   NT rolled patient down with all belongings to family car. 

## 2017-04-02 NOTE — Discharge Summary (Signed)
Physician Discharge Summary  Patient ID: Debra Farmer MRN: [id number] DOB/AGE: 03/04/1946 71 y.o.  Admit date: 04/01/2017 Discharge date: 04/02/2017  Admission Diagnoses:  Primary osteoarthritis of right hip  Discharge Diagnoses:  Principal Problem:   Primary osteoarthritis of right hip Active Problems:   Osteoarthritis of right hip   Past Medical History:  Diagnosis Date  . Arthritis   . COPD (chronic obstructive pulmonary disease) ([place])   . GERD (gastroesophageal reflux disease)   . Neuromuscular disorder ([place])    bELLS PALSY  2 YEARS AGO    Surgeries: Procedure(s): RIGHT TOTAL HIP ARTHROPLASTY ANTERIOR APPROACH on 04/01/2017   Consultants (if any):   Discharged Condition: Improved  Hospital Course: Debra Farmer is an 71 y.o. female who was admitted 04/01/2017 with a diagnosis of Primary osteoarthritis of right hip and went to the operating room on 04/01/2017 and underwent the above named procedures.    She was given perioperative antibiotics:  Anti-infectives    Start     Dose/Rate Route Frequency Ordered Stop   04/01/17 1800  ceFAZolin (ANCEF) IVPB 2g/100 mL premix     2 g 200 mL/hr over 30 Minutes Intravenous Every 6 hours 04/01/17 1538 04/02/17 0128   04/01/17 0914  ceFAZolin (ANCEF) IVPB 2g/100 mL premix     2 g 200 mL/hr over 30 Minutes Intravenous On call to O.R. 04/01/17 0914 04/01/17 1200    .  She was given sequential compression devices, early ambulation, and ASA for DVT prophylaxis.  She benefited maximally from the hospital stay and there were no complications.    Recent vital signs:  Vitals:   04/02/17 0627 04/02/17 1009  BP: (!) 113/54 132/60  Pulse: (!) 55 (!) 59  Resp: 16 17  Temp: 98.2 F (36.8 C) 98.7 F (37.1 C)  SpO2: 99% 97%    Recent laboratory studies:  Lab Results  Component Value Date   HGB 12.2 04/02/2017   HGB 15.1 (H) 03/23/2017   HGB 15.2 (H) 08/18/2016   Lab Results  Component Value Date   WBC 13.7 (H)  04/02/2017   PLT 228 04/02/2017   No results found for: INR Lab Results  Component Value Date   NA 137 04/02/2017   K 4.2 04/02/2017   CL 105 04/02/2017   CO2 26 04/02/2017   BUN 12 04/02/2017   CREATININE 0.62 04/02/2017   GLUCOSE 142 (H) 04/02/2017    Discharge Medications:   Allergies as of 04/02/2017      Reactions   Aspirin Other (See Comments)   REACTION: upset stomach Must be coated    Codeine Sulfate Other (See Comments)   "drugged out feeling" & hallucinations.   Lipitor [atorvastatin]    cramps   Nabumetone Other (See Comments)   Unsure of reaction (reaction occurred sometime ago)      Medication List    STOP taking these medications   acetaminophen 650 MG CR tablet Commonly known as:  TYLENOL   traMADol 50 MG tablet Commonly known as:  ULTRAM     TAKE these medications   aspirin 81 MG EC tablet Take 1 tablet (81 mg total) by mouth 2 (two) times daily.   docusate sodium 100 MG capsule Commonly known as:  COLACE Take 1 capsule (100 mg total) by mouth 2 (two) times daily.   HYDROcodone-acetaminophen 5-325 MG tablet Commonly known as:  NORCO/VICODIN Take 1-2 tablets by mouth every 6 (six) hours as needed for severe pain.   ipratropium-albuterol 0.5-2.5 (3) MG/3ML Soln  Commonly known as:  DUONEB Take 3 mLs by nebulization every 4 (four) hours as needed. What changed:  reasons to take this   leflunomide 20 MG tablet Commonly known as:  ARAVA Take 20 mg by mouth daily.   ondansetron 4 MG tablet Commonly known as:  ZOFRAN Take 1 tablet (4 mg total) by mouth every 6 (six) hours as needed for nausea.   predniSONE 1 MG tablet Commonly known as:  DELTASONE Take 4 mg by mouth daily with breakfast.   senna 8.6 MG Tabs tablet Commonly known as:  SENOKOT Take 2 tablets (17.2 mg total) by mouth at bedtime.   Vitamin B-12 5000 MCG Subl Place 5,000 mcg under the tongue daily.   Vitamin D3 5000 units Tabs Take 5,000 Units by mouth at bedtime. WITH A  GLASS OF MILK   zolpidem 10 MG tablet Commonly known as:  AMBIEN TAKE 1 TABLET BY MOUTH EACH NIGHT AT BEDTIME AS NEEDED FOR SLEEP What changed:  how much to take  how to take this  when to take this  reasons to take this  additional instructions            Durable Medical Equipment        Start     Ordered   04/01/17 1539  DME Walker rolling  Once    Question:  Patient needs a walker to treat with the following condition  Answer:  Status post total hip replacement, right   04/01/17 1538   04/01/17 1539  DME 3 n 1  Once     04/01/17 1538      Diagnostic Studies: Dg Pelvis Portable  Result Date: 04/01/2017 CLINICAL DATA:  Status post right anterior approach hip joint prosthesis placement. EXAM: PORTABLE PELVIS 1-2 VIEWS COMPARISON:  Intraoperative fluoro spot radiographs of the right hip. FINDINGS: The patient has undergone right total hip joint prosthesis placement. Radiographic positioning of the prosthetic components is good. The interface with the native bone appears normal. There is soft tissue gas in the right thigh. IMPRESSION: No immediate postprocedure complication following right hip joint prosthesis placement. These results will be called to the ordering clinician or representative by the Radiologist Assistant, and communication documented in the PACS or zVision Dashboard. Electronically Signed   By: David  Martinique M.D.   On: 04/01/2017 15:00   Dg C-arm 61-120 Min-no Report  Result Date: 04/01/2017 Fluoroscopy was utilized by the requesting physician.  No radiographic interpretation.   Dg Hip Operative Unilat W Or W/o Pelvis Right  Result Date: 04/01/2017 CLINICAL DATA:  Hip replacement. EXAM: DG C-ARM 61-120 MIN-NO REPORT; OPERATIVE RIGHT HIP WITH PELVIS COMPARISON:  04/28/2016 . FINDINGS: Total right hip replacement. Anatomic alignment. Hardware intact. No acute bony abnormality identified. IMPRESSION: Total right hip replacement with anatomic alignment.  Electronically Signed   By: Marcello Moores  Register   On: 04/01/2017 13:43    Disposition: 01-Home or Self Care  Discharge Instructions    Call MD / Call 911    Complete by:  As directed    If you experience chest pain or shortness of breath, CALL 911 and be transported to the hospital emergency room.  If you develope a fever above 101 F, pus (white drainage) or increased drainage or redness at the wound, or calf pain, call your surgeon's office.   Constipation Prevention    Complete by:  As directed    Drink plenty of fluids.  Prune juice may be helpful.  You may use a stool softener,  such as Colace (over the counter) 100 mg twice a day.  Use MiraLax (over the counter) for constipation as needed.   Diet - low sodium heart healthy    Complete by:  As directed    Driving restrictions    Complete by:  As directed    No driving for 6 weeks   Increase activity slowly as tolerated    Complete by:  As directed    Lifting restrictions    Complete by:  As directed    No lifting for 6 weeks   TED hose    Complete by:  As directed    Use stockings (TED hose) for 2 weeks on both leg(s).  You may remove them at night for sleeping.      Follow-up Information    Tawnya Pujol, Aaron Edelman, MD. Schedule an appointment as soon as possible for a visit in 2 week(s).   Specialty:  Orthopedic Surgery Why:  For wound re-check Contact information: [address] [place] [address] [phone]        [facility] - Dme Follow up.   Why:  walker and bedside commode Contact information: [place] [address] [phone]            Signed: Elie Goody 04/02/2017, 12:44 PM

## 2017-04-02 NOTE — Progress Notes (Signed)
   Subjective:  Patient reports pain as mild to moderate.  Denies N/V/CP/SOB.  Objective:   VITALS:   Vitals:   04/01/17 2142 04/02/17 0140 04/02/17 0627 04/02/17 1009  BP: (!) 123/56 97/76 (!) 113/54 132/60  Pulse: (!) 54 (!) 47 (!) 55 (!) 59  Resp: 16 16 16 17   Temp: 98.1 F (36.7 C) 98 F (36.7 C) 98.2 F (36.8 C) 98.7 F (37.1 C)  TempSrc: Oral Oral Oral Oral  SpO2: 97% 98% 99% 97%  Weight:      Height:        NAD ABD soft Sensation intact distally Intact pulses distally Dorsiflexion/Plantar flexion intact Incision: dressing C/D/I Compartment soft   Lab Results  Component Value Date   WBC 13.7 (H) 04/02/2017   HGB 12.2 04/02/2017   HCT 37.1 04/02/2017   MCV 89.6 04/02/2017   PLT 228 04/02/2017   BMET    Component Value Date/Time   NA 137 04/02/2017 0542   K 4.2 04/02/2017 0542   CL 105 04/02/2017 0542   CO2 26 04/02/2017 0542   GLUCOSE 142 (H) 04/02/2017 0542   BUN 12 04/02/2017 0542   CREATININE 0.62 04/02/2017 0542   CALCIUM 8.2 (L) 04/02/2017 0542   GFRNONAA >60 04/02/2017 0542   GFRAA >60 04/02/2017 0542     Assessment/Plan: 1 Day Post-Op   Principal Problem:   Primary osteoarthritis of right hip Active Problems:   Osteoarthritis of right hip   WBAT with walker ASA, SCds, TEDs PO pain control D/C home today   Edsel Shives, Horald Pollen 04/02/2017, 12:41 PM   Rod Can, MD Cell [phone]

## 2017-04-02 NOTE — Progress Notes (Signed)
   04/02/17 1200  PT Visit Information  Last PT Received On 04/02/17  Assistance Needed +1  History of Present Illness s/p R THA  Subjective Data  Patient Stated Goal home today  Precautions  Precautions Fall  Restrictions  Weight Bearing Restrictions No  Pain Assessment  Pain Assessment 0-10  Pain Score 1  Pain Location right hip  Pain Descriptors / Indicators Discomfort  Pain Intervention(s) Monitored during session;Ice applied  Cognition  Arousal/Alertness Awake/alert  Behavior During Therapy WFL for tasks assessed/performed  Overall Cognitive Status Within Functional Limits for tasks assessed  Bed Mobility  General bed mobility comments OOB in chair  Transfers  Overall transfer level Needs assistance  Equipment used Rolling walker (2 wheeled)  Transfers Sit to/from Stand  Sit to Stand Modified independent (Device/Increase time)  Ambulation/Gait  Ambulation/Gait assistance Modified independent (Device/Increase time);Supervision  Ambulation Distance (Feet) 160 Feet  Assistive device Rolling walker (2 wheeled)  Gait Pattern/deviations Step-through pattern  General Gait Details cues for RW position  Stairs Yes  Stairs assistance Min guard  Stair Management Two rails;Forwards  Number of Stairs 5  General stair comments cues for sequence  PT - End of Session  Equipment Utilized During Treatment Gait belt  Activity Tolerance Patient tolerated treatment well  Patient left with call bell/phone within reach;in chair  Nurse Communication Mobility status  PT - Assessment/Plan  PT Plan Current plan remains appropriate  PT Visit Diagnosis Difficulty in walking, not elsewhere classified (R26.2)  PT Frequency (ACUTE ONLY) 7X/week  Follow Up Recommendations DC plan and follow up therapy as arranged by surgeon  PT equipment Rolling walker with 5" wheels  AM-PAC PT "6 Clicks" Daily Activity Outcome Measure  Difficulty turning over in bed (including adjusting bedclothes, sheets and  blankets)? 3  Difficulty moving from lying on back to sitting on the side of the bed?  3  Difficulty sitting down on and standing up from a chair with arms (e.g., wheelchair, bedside commode, etc,.)? 3  Help needed moving to and from a bed to chair (including a wheelchair)? 3  Help needed walking in hospital room? 3  Help needed climbing 3-5 steps with a railing?  3  6 Click Score 18  Mobility G Code  CK  PT Goal Progression  Progress towards PT goals Progressing toward goals  Acute Rehab PT Goals  PT Goal Formulation With patient  Time For Goal Achievement 04/05/17  Potential to Achieve Goals Good  PT Time Calculation  PT Start Time (ACUTE ONLY) 1042  PT Stop Time (ACUTE ONLY) 1059  PT Time Calculation (min) (ACUTE ONLY) 17 min  PT General Charges  $$ ACUTE PT VISIT 1 Visit  PT Treatments  $Gait Training 8-22 mins

## 2017-04-13 ENCOUNTER — Other Ambulatory Visit: Payer: Self-pay | Admitting: *Deleted

## 2017-04-13 NOTE — Patient Outreach (Signed)
[facility]) Care Management  04/13/2017  Debra Farmer [birth year] [id number]   Subjective: Telephone call to patient's home  / mobile number, no answer, left HIPAA compliant voicemail message, and requested call back.    Objective: Per KPN (Knowledge Performance Now, point of care tool), [employer] iCollaborate, and chart review, patient hospitalized 04/01/17 -04/02/17 for Primary osteoarthritis of right hip.   Status post Right total hip arthroplasty, anterior approach on 04/01/17.  Patient also has a history of Bells Palsy and COPD.    Assessment: Received [employer] Transition of care referral on 04/09/17.   Transition of care follow up pending patient contact.      Plan: RNCM will call patient for 2nd telephone outreach attempt, transition of care follow up, within 10 business days if no return call.     Katharina Jehle H. Annia Friendly, BSN, [place] Management [facility] Telephonic CM Phone: [phone] Fax: [phone]

## 2017-04-14 ENCOUNTER — Other Ambulatory Visit: Payer: Self-pay | Admitting: *Deleted

## 2017-04-14 ENCOUNTER — Ambulatory Visit: Payer: Managed Care, Other (non HMO) | Admitting: *Deleted

## 2017-04-14 NOTE — Patient Outreach (Signed)
[facility]) Care Management  04/14/2017  Debra Farmer 31-Aug-1945 [id number]   Subjective: Telephone call to patient's home  / mobile number, no answer, left HIPAA compliant voicemail message, and requested call back.    Objective: Per KPN (Knowledge Performance Now, point of care tool), [employer] iCollaborate, and chart review, patient hospitalized 04/01/17 -04/02/17 for Primary osteoarthritis of right hip.   Status post Righttotal hip arthroplasty, anterior approach on 04/01/17.  Patient also has a history of Bells Palsy and COPD.    Assessment: Received [employer] Transition of care referral on 04/09/17.   Transition of care follow up pending patient contact.      Plan: RNCM will call patient for 3rd telephone outreach attempt, transition of care follow up, within 10 business days if no return call.     Jullia Mulligan H. Annia Friendly, BSN, [place] Management [facility] Telephonic CM Phone: [phone] Fax: [phone]

## 2017-04-15 ENCOUNTER — Encounter: Payer: Self-pay | Admitting: *Deleted

## 2017-04-15 ENCOUNTER — Other Ambulatory Visit: Payer: Self-pay | Admitting: *Deleted

## 2017-04-15 ENCOUNTER — Ambulatory Visit: Payer: Self-pay | Admitting: *Deleted

## 2017-04-15 NOTE — Patient Outreach (Addendum)
[name]) Farmer Management  04/15/2017  Debra Farmer Jan 09, 1946 [id number]   Subjective:  Received voicemail message from Debra Farmer, states she is returning call, and requested call back.  Telephone call to patient's home / mobile number, spoke with patient, and HIPAA verified.  Discussed [facility] Farmer Management [employer] Transition of Farmer follow up, patient voiced understanding, and is in agreement to follow up.  Patient states she is doing well, husband is assisting with activities of daily living as needed, and has a follow up appointment with surgeon this afternoon.  Patient voices understanding of medical diagnosis, surgery, and treatment plan. States she is accessing her [name] benefits as needed via member services number on back of card or through [email or web address]  Patient states she does not have any education material, transition of Farmer, Farmer coordination, disease management, disease monitoring, transportation, community resource, or pharmacy needs at this time.  States she is very appreciative of the follow up and is in agreement to receive [name] information.    Objective: Per KPN (Knowledge Performance Now, point of Farmer tool), [employer] iCollaborate, and chart review, patient hospitalized 04/01/17 -04/02/17 for Primary osteoarthritis of right hip. Status post Righttotal hip arthroplasty, anterior approach on 04/01/17. Patient also has a history of Bells Palsy and COPD.    Assessment: Received [employer] Transition of Farmer referral on 04/09/17. Transition of Farmer follow up completed, no Farmer management needs, and will proceed with case closure.     Plan: RNCM will send patient successful outreach letter, [facility] pamphlet, and magnet. RNCM will send case closure due to follow up completed / no Farmer management needs request to Debra Farmer at [facility].     Debra Farmer, BSN, [place] Management [facility] Telephonic  CM Phone: [phone] Fax: [phone]

## 2017-04-23 ENCOUNTER — Telehealth: Payer: Self-pay

## 2017-04-23 NOTE — Telephone Encounter (Signed)
Left message asking patient to call back to discuss insurance for starting prolia---needs to talk with tamara

## 2017-06-09 ENCOUNTER — Other Ambulatory Visit: Payer: Self-pay | Admitting: Internal Medicine

## 2017-06-16 ENCOUNTER — Telehealth: Payer: Self-pay | Admitting: Internal Medicine

## 2017-06-16 MED ORDER — PREDNISONE 1 MG PO TABS: 4.0000 mg | ORAL_TABLET | Freq: Every day | ORAL | 1 refills | Status: DC

## 2017-06-16 NOTE — Telephone Encounter (Signed)
Ok Thx 

## 2017-06-16 NOTE — Telephone Encounter (Signed)
Copied from [place] [id number]. Topic: Quick Communication - Rx Refill/Question >> Jun 16, 2017 12:42 PM Scherrie Gerlach wrote: Has the patient contacted their pharmacy? no Pt states she is unable to get in touch with Dr Gavin Pound this week and wants to know if Dr Alain Marion will refill for  predniSONE (DELTASONE) 1 MG tablet  Pt takes 4/ a day Pt took her last today Preferred Pharmacy (with phone number or street name): [facility] - [address], [place]. [phone] (Phone) [phone] (Fax)  Pt would like to know if Dr Alain Marion would take this Rx over from now on.  Dr Alain Marion has never filled this Rx before

## 2017-06-17 DIAGNOSIS — M0609 Rheumatoid arthritis without rheumatoid factor, multiple sites: Secondary | ICD-10-CM | POA: Diagnosis not present

## 2017-06-17 DIAGNOSIS — Z79899 Other long term (current) drug therapy: Secondary | ICD-10-CM | POA: Diagnosis not present

## 2017-06-17 NOTE — Telephone Encounter (Signed)
Notified pt MD sent rx to [facility] 

## 2017-07-13 DIAGNOSIS — M1611 Unilateral primary osteoarthritis, right hip: Secondary | ICD-10-CM | POA: Diagnosis not present

## 2017-07-13 DIAGNOSIS — Z96641 Presence of right artificial hip joint: Secondary | ICD-10-CM | POA: Diagnosis not present

## 2017-07-13 DIAGNOSIS — Z471 Aftercare following joint replacement surgery: Secondary | ICD-10-CM | POA: Diagnosis not present

## 2017-07-14 DIAGNOSIS — M0609 Rheumatoid arthritis without rheumatoid factor, multiple sites: Secondary | ICD-10-CM | POA: Diagnosis not present

## 2017-07-26 ENCOUNTER — Other Ambulatory Visit: Payer: Self-pay | Admitting: Internal Medicine

## 2017-07-26 NOTE — Telephone Encounter (Signed)
Routing to dr plotnikov, please advise, thanks 

## 2017-07-28 DIAGNOSIS — Z96641 Presence of right artificial hip joint: Secondary | ICD-10-CM | POA: Diagnosis not present

## 2017-08-05 ENCOUNTER — Other Ambulatory Visit: Payer: Self-pay | Admitting: Internal Medicine

## 2017-08-05 NOTE — Telephone Encounter (Signed)
Done erx 

## 2017-08-05 NOTE — Telephone Encounter (Signed)
Routing to dr Jenny Reichmann in the absence of dr plotnikov, please advise, thanks

## 2017-08-24 DIAGNOSIS — Z6823 Body mass index (BMI) 23.0-23.9, adult: Secondary | ICD-10-CM | POA: Diagnosis not present

## 2017-08-24 DIAGNOSIS — M0609 Rheumatoid arthritis without rheumatoid factor, multiple sites: Secondary | ICD-10-CM | POA: Diagnosis not present

## 2017-08-24 DIAGNOSIS — Z79899 Other long term (current) drug therapy: Secondary | ICD-10-CM | POA: Diagnosis not present

## 2017-08-24 DIAGNOSIS — M150 Primary generalized (osteo)arthritis: Secondary | ICD-10-CM | POA: Diagnosis not present

## 2017-08-24 DIAGNOSIS — M2550 Pain in unspecified joint: Secondary | ICD-10-CM | POA: Diagnosis not present

## 2017-09-02 ENCOUNTER — Other Ambulatory Visit: Payer: Self-pay | Admitting: Internal Medicine

## 2017-09-02 NOTE — Telephone Encounter (Signed)
Patient calling checking status, almost out of meds, please advise. States pharmacy was suppose to send on Monday. Call back [phone]

## 2017-09-03 ENCOUNTER — Other Ambulatory Visit: Payer: Self-pay | Admitting: Internal Medicine

## 2017-09-03 NOTE — Telephone Encounter (Signed)
Patient said she is in need of her tramadol. She requested it Monday with pharmacy. She doesn't understand why it was not sent to office until the 3/21

## 2017-09-03 NOTE — Telephone Encounter (Signed)
Pt calling to check status on medication refill. She is out of the medication.

## 2017-09-03 NOTE — Telephone Encounter (Signed)
tramadol refill Last OV: 03/03/17 Last Refill:08/05/17 #120 tabs 0 refill Pharmacy:[facility] PCP: Dr Alain Marion

## 2017-09-04 NOTE — Telephone Encounter (Signed)
Patient called [facility] on Saturday and was sent to the office regarding this refill. The patient stated that the request was sent from the pharmacy on Monday and she did not understand why it has taken so long. I explained to her that the refill was sent to the wrong provider on 03/21 and was resent to Dr Plotnikov yesterday afternoon. She expressed understanding and will wait for it to be sent at Dr [employer] convenience.

## 2017-09-06 NOTE — Telephone Encounter (Signed)
OV q 3 mo 

## 2017-09-06 NOTE — Telephone Encounter (Signed)
MD approved this am and sent rx back to [facility].Marland KitchenJohny Chess

## 2017-09-07 ENCOUNTER — Ambulatory Visit (INDEPENDENT_AMBULATORY_CARE_PROVIDER_SITE_OTHER): Payer: Managed Care, Other (non HMO) | Admitting: Internal Medicine

## 2017-09-07 ENCOUNTER — Encounter: Payer: Self-pay | Admitting: Internal Medicine

## 2017-09-07 VITALS — BP 106/58 | HR 86 | Temp 98.4°F | Wt 118.0 lb

## 2017-09-07 DIAGNOSIS — Z720 Tobacco use: Secondary | ICD-10-CM | POA: Diagnosis not present

## 2017-09-07 DIAGNOSIS — J440 Chronic obstructive pulmonary disease with acute lower respiratory infection: Secondary | ICD-10-CM

## 2017-09-07 DIAGNOSIS — M0600 Rheumatoid arthritis without rheumatoid factor, unspecified site: Secondary | ICD-10-CM | POA: Diagnosis not present

## 2017-09-07 DIAGNOSIS — J209 Acute bronchitis, unspecified: Secondary | ICD-10-CM | POA: Diagnosis not present

## 2017-09-07 DIAGNOSIS — J449 Chronic obstructive pulmonary disease, unspecified: Secondary | ICD-10-CM | POA: Diagnosis not present

## 2017-09-07 MED ORDER — AZITHROMYCIN 250 MG PO TABS: ORAL_TABLET | ORAL | 0 refills | Status: DC

## 2017-09-07 NOTE — Progress Notes (Signed)
Subjective:    Patient ID: Debra Farmer, female    DOB: 1945-11-03, 72 y.o.   MRN: [id number]  HPI  72 year old patient who has a history of COPD.  She is also on immunotherapy for RA.  For the past week she has had increased cough congestion and wheezing.  She has been using home nebulizer treatments more frequently.  Cough is nonproductive.  She has noted some fever at night to as high as 101.5 degrees. She is on chronic prednisone therapy at a dose of 1 mg daily.  She has been attempting a taper and discontinuation for a number of years She continues to smoke  Past Medical History:  Diagnosis Date  . Arthritis   . COPD (chronic obstructive pulmonary disease) ([place])   . GERD (gastroesophageal reflux disease)   . Neuromuscular disorder ([place])    bELLS PALSY  2 YEARS AGO     Social History   Socioeconomic History  . Marital status: Married    Spouse name: Not on file  . Number of children: Not on file  . Years of education: Not on file  . Highest education level: Not on file  Occupational History  . Not on file  Social Needs  . Financial resource strain: Not on file  . Food insecurity:    Worry: Not on file    Inability: Not on file  . Transportation needs:    Medical: Not on file    Non-medical: Not on file  Tobacco Use  . Smoking status: Current Every Day Smoker    Packs/day: 0.50    Years: 58.00    Pack years: 29.00    Types: Cigarettes  . Smokeless tobacco: Never Used  Substance and Sexual Activity  . Alcohol use: No  . Drug use: No  . Sexual activity: Yes  Lifestyle  . Physical activity:    Days per week: Not on file    Minutes per session: Not on file  . Stress: Not on file  Relationships  . Social connections:    Talks on phone: Not on file    Gets together: Not on file    Attends religious service: Not on file    Active member of club or organization: Not on file    Attends meetings of clubs or organizations: Not on file    Relationship  status: Not on file  . Intimate partner violence:    Fear of current or ex partner: Not on file    Emotionally abused: Not on file    Physically abused: Not on file    Forced sexual activity: Not on file  Other Topics Concern  . Not on file  Social History Narrative  . Not on file    Past Surgical History:  Procedure Laterality Date  . ABDOMINAL HYSTERECTOMY     partial  . APPENDECTOMY    . CATARACT EXTRACTION Bilateral    about 3 years ago   . CHOLECYSTECTOMY    . KNEE ARTHROSCOPY  1999   Dr Theda Sers -- R knee  . LUMBAR LAMINECTOMY/DECOMPRESSION MICRODISCECTOMY Right 08/26/2016   Procedure: Microlumbar decompression L4-5, L5-S1 on Right;  Surgeon: Susa Day, MD;  Location: [facility];  Service: Orthopedics;  Laterality: Right;  . TOTAL HIP ARTHROPLASTY Right 04/01/2017   Procedure: RIGHT TOTAL HIP ARTHROPLASTY ANTERIOR APPROACH;  Surgeon: Rod Can, MD;  Location: [facility];  Service: Orthopedics;  Laterality: Right;  Needs RNFA    Family History  Problem Relation Age of Onset  .  Heart disease Mother 76       CHF  . COPD Father 41       emphesema    Allergies  Allergen Reactions  . Aspirin Other (See Comments)    REACTION: upset stomach  Must be coated   . Codeine Sulfate Other (See Comments)    "drugged out feeling" & hallucinations.  . Lipitor [Atorvastatin]     cramps  . Nabumetone Other (See Comments)    Unsure of reaction (reaction occurred sometime ago)    Current Outpatient Medications on File Prior to Visit  Medication Sig Dispense Refill  . aspirin EC 81 MG EC tablet Take 1 tablet (81 mg total) by mouth 2 (two) times daily. 60 tablet 1  . Cholecalciferol (VITAMIN D3) 5000 units TABS Take 5,000 Units by mouth at bedtime. WITH A GLASS OF MILK    . Cyanocobalamin (VITAMIN B-12) 5000 MCG SUBL Place 5,000 mcg under the tongue daily.    [name] ipratropium-albuterol (DUONEB) 0.5-2.5 (3) MG/3ML SOLN Take 3 mLs by nebulization every 4 (four) hours as needed.  (Patient taking differently: Take 3 mLs by nebulization every 4 (four) hours as needed (for shortness of breath/wheezing). ) 360 mL 3  . leflunomide (ARAVA) 20 MG tablet Take 20 mg by mouth daily.    . predniSONE (DELTASONE) 1 MG tablet Take 4 tablets (4 mg total) by mouth daily with breakfast. 120 tablet 1  . senna (SENOKOT) 8.6 MG TABS tablet Take 2 tablets (17.2 mg total) by mouth at bedtime. 120 each 0  . traMADol (ULTRAM) 50 MG tablet TAKE 1 TO 2 TABLETS BY MOUTH TWICE DAILYAS NEEDED FOR PAIN 120 tablet 2  . zolpidem (AMBIEN) 10 MG tablet TAKE 1 TABLET BY MOUTH EACH NIGHT AT BEDTIME AS NEEDED FOR SLEEP 30 tablet 2   No current facility-administered medications on file prior to visit.     BP (!) 106/58 (BP Location: Right Arm, Patient Position: Sitting, Cuff Size: Normal)   Pulse 86   Temp 98.4 F (36.9 C) (Oral)   Wt 118 lb (53.5 kg)   SpO2 93%   BMI 23.05 kg/m     Review of Systems  Constitutional: Positive for activity change, appetite change, fatigue and fever.  HENT: Negative for congestion, dental problem, hearing loss, rhinorrhea, sinus pressure, sore throat and tinnitus.   Eyes: Negative for pain, discharge and visual disturbance.  Respiratory: Positive for cough, shortness of breath and wheezing.   Cardiovascular: Negative for chest pain, palpitations and leg swelling.  Gastrointestinal: Negative for abdominal distention, abdominal pain, blood in stool, constipation, diarrhea, nausea and vomiting.  Genitourinary: Negative for difficulty urinating, dysuria, flank pain, frequency, hematuria, pelvic pain, urgency, vaginal bleeding, vaginal discharge and vaginal pain.  Musculoskeletal: Negative for arthralgias, gait problem and joint swelling.  Skin: Negative for rash.  Neurological: Negative for dizziness, syncope, speech difficulty, weakness, numbness and headaches.  Hematological: Negative for adenopathy.  Psychiatric/Behavioral: Negative for agitation, behavioral  problems and dysphoric mood. The patient is not nervous/anxious.        Objective:   Physical Exam  Constitutional: She is oriented to person, place, and time. She appears well-developed and well-nourished. No distress.  Afebrile Blood pressure low normal O2 saturation 93%  HENT:  Head: Normocephalic.  Right Ear: External ear normal.  Left Ear: External ear normal.  Mouth/Throat: Oropharynx is clear and moist.  Eyes: Pupils are equal, round, and reactive to light. Conjunctivae and EOM are normal.  Neck: Normal range of motion. Neck supple.  No thyromegaly present.  Cardiovascular: Normal rate, regular rhythm, normal heart sounds and intact distal pulses.  Pulmonary/Chest: Effort normal and breath sounds normal.  Abdominal: Soft. She exhibits no mass. There is no tenderness.  Diffuse coarse rhonchi and faint wheezing No increased work of breathing  Musculoskeletal: Normal range of motion.  Lymphadenopathy:    She has no cervical adenopathy.  Neurological: She is alert and oriented to person, place, and time.  Skin: Skin is warm and dry. No rash noted.  Psychiatric: She has a normal mood and affect. Her behavior is normal.          Assessment & Plan:   COPD exacerbation.  Total smoking cessation encouraged.  Will encourage liberal fluid intake.  Continue Mucinex.  Will hydrate.  Azithromycin Will increase prednisone short-term before tapering down to baseline 1 mg daily Rheumatoid arthritis Immunosuppression  Follow-up PCP Continue home nebulizer treatments 3 times daily Patient report any clinical worsening or new symptoms A note to remain out of work dictated  Nyoka Cowden

## 2017-09-07 NOTE — Patient Instructions (Addendum)
Take over-the-counter expectorants and cough medications such as  Mucinex DM.  Call if there is no improvement in 5 to 7 days or if  you develop worsening cough, fever, or new symptoms, such as shortness of breath or chest pain.  Prednisone 4mg  twice daily for 3 days, then 4mg  in the morning and 2mg  in the afternoon for 3 days,  then resume prednisone 4  mg daily  Use your home nebulizer treatment 3-4 times per day  Hydrate and Humidify  Drink enough water to keep your urine clear or pale yellow. Staying hydrated will help to thin your mucus.  Use a cool mist humidifier to keep the humidity level in your home above 50%.  Inhale steam for 10-15 minutes, 3-4 times a day or as told by your health care provider. You can do this in the bathroom while a hot shower is running.  Limit your exposure to cool or dry air. Rest  Rest as much as possible.  Smoking tobacco is very bad for your health. You should stop smoking immediately.  Take your antibiotic as prescribed until ALL of it is gone, but stop if you develop a rash, swelling, or any side effects of the medication.  Contact our office as soon as possible if  there are side effects of the medication.

## 2017-09-10 ENCOUNTER — Other Ambulatory Visit: Payer: Self-pay

## 2017-09-10 ENCOUNTER — Telehealth: Payer: Self-pay

## 2017-09-10 NOTE — Telephone Encounter (Signed)
Letter to patient. No further action needed.

## 2017-09-21 ENCOUNTER — Encounter (HOSPITAL_COMMUNITY): Payer: Self-pay | Admitting: Emergency Medicine

## 2017-09-21 ENCOUNTER — Emergency Department (HOSPITAL_COMMUNITY): Payer: Managed Care, Other (non HMO)

## 2017-09-21 ENCOUNTER — Emergency Department (HOSPITAL_COMMUNITY)
Admission: EM | Admit: 2017-09-21 | Discharge: 2017-09-21 | Disposition: A | Discharge status: Home / Self Care | Payer: Managed Care, Other (non HMO) | Attending: Emergency Medicine | Admitting: Emergency Medicine

## 2017-09-21 DIAGNOSIS — R0789 Other chest pain: Secondary | ICD-10-CM | POA: Diagnosis not present

## 2017-09-21 DIAGNOSIS — F17210 Nicotine dependence, cigarettes, uncomplicated: Secondary | ICD-10-CM | POA: Insufficient documentation

## 2017-09-21 DIAGNOSIS — Z96641 Presence of right artificial hip joint: Secondary | ICD-10-CM | POA: Insufficient documentation

## 2017-09-21 DIAGNOSIS — Z7982 Long term (current) use of aspirin: Secondary | ICD-10-CM | POA: Diagnosis not present

## 2017-09-21 DIAGNOSIS — J449 Chronic obstructive pulmonary disease, unspecified: Secondary | ICD-10-CM | POA: Insufficient documentation

## 2017-09-21 DIAGNOSIS — Z79899 Other long term (current) drug therapy: Secondary | ICD-10-CM | POA: Diagnosis not present

## 2017-09-21 DIAGNOSIS — R079 Chest pain, unspecified: Secondary | ICD-10-CM | POA: Diagnosis not present

## 2017-09-21 LAB — I-STAT TROPONIN, ED
TROPONIN I, POC: 0 ng/mL (ref 0.00–0.08)
TROPONIN I, POC: 0 ng/mL (ref 0.00–0.08)

## 2017-09-21 LAB — BASIC METABOLIC PANEL
ANION GAP: 11 (ref 5–15)
BUN: 11 mg/dL (ref 6–20)
CALCIUM: 9.6 mg/dL (ref 8.9–10.3)
CO2: 26 mmol/L (ref 22–32)
Chloride: 98 mmol/L — ABNORMAL LOW (ref 101–111)
Creatinine, Ser: 0.69 mg/dL (ref 0.44–1.00)
Glucose, Bld: 98 mg/dL (ref 65–99)
Potassium: 4.7 mmol/L (ref 3.5–5.1)
Sodium: 135 mmol/L (ref 135–145)

## 2017-09-21 LAB — CBC
HCT: 45.9 % (ref 36.0–46.0)
HEMOGLOBIN: 14.9 g/dL (ref 12.0–15.0)
MCH: 29.4 pg (ref 26.0–34.0)
MCHC: 32.5 g/dL (ref 30.0–36.0)
MCV: 90.5 fL (ref 78.0–100.0)
Platelets: 261 10*3/uL (ref 150–400)
RBC: 5.07 MIL/uL (ref 3.87–5.11)
RDW: 14.5 % (ref 11.5–15.5)
WBC: 12 10*3/uL — ABNORMAL HIGH (ref 4.0–10.5)

## 2017-09-21 MED ORDER — IOPAMIDOL (ISOVUE-370) INJECTION 76%
INTRAVENOUS | Status: AC
  Administered 2017-09-21: 100 mL
  Filled 2017-09-21: qty 100

## 2017-09-21 MED ORDER — IOPAMIDOL (ISOVUE-370) INJECTION 76%: 100.0000 mL | Freq: Once | INTRAVENOUS | Status: DC | PRN

## 2017-09-21 MED ORDER — MORPHINE SULFATE (PF) 4 MG/ML IV SOLN
2.0000 mg | Freq: Once | INTRAVENOUS | Status: AC
  Administered 2017-09-21: 2 mg via INTRAVENOUS
  Filled 2017-09-21: qty 1

## 2017-09-21 NOTE — ED Triage Notes (Signed)
Pt arrives via EMS  complains of CP for 2 hours today. Increases with inspiration. Pt was diagnosed with bronchitis two weeks ago and has COPD. Denies dizziness, N/V.

## 2017-09-21 NOTE — ED Provider Notes (Signed)
  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  She continues to smoke cigarettes.  Her cholesterol is mildly elevated.  No prior cardiac history.   Physical exam: Alert female, appears her stated age.  Chest nontender to palpation.  Heart regular rate and rhythm no murmur.  Lungs clear to auscultation.  No increased work of breathing.  Normal range of motion of arms and legs bilaterally.  Medical screening examination/treatment/procedure(s) were conducted as a shared visit with non-physician practitioner(s) and myself.  I personally evaluated the patient during the encounter    Daleen Bo, MD 09/23/17 1506

## 2017-09-21 NOTE — ED Notes (Signed)
Patient transported to CT 

## 2017-09-21 NOTE — ED Triage Notes (Signed)
Pt took 324 mg ASA. No nitro given

## 2017-09-21 NOTE — ED Notes (Signed)
ED Provider at bedside. 

## 2017-09-21 NOTE — ED Notes (Signed)
Husbands number [phone] or [phone] call if anything changes

## 2017-09-21 NOTE — ED Provider Notes (Signed)
[facility] EMERGENCY DEPARTMENT Provider Note   CSN: [id number] Arrival date & time: 09/21/17  1055     History   Chief Complaint Chief Complaint  Patient presents with  . Chest Pain    HPI Debra Farmer is a 72 y.o. female.  HPI   Debra Farmer is a 72 y.o. female, with a history of COPD, presenting to the ED with chest pain beginning around 8:30am this morning while at work. Pain is constant, feels like a grabbing, across the entire upper chest, 8/10. Has not experienced this before.   Denies shortness of breath, cough, fever/chills, vomiting, diarrhea, diaphoresis, dizziness, falls/trauma, syncope, abdominal pain, or any other complaints.  Has PCP appt tomorrow.  Past Medical History:  Diagnosis Date  . Arthritis   . COPD (chronic obstructive pulmonary disease) ([place])   . GERD (gastroesophageal reflux disease)   . Neuromuscular disorder ([place])    bELLS PALSY  2 YEARS AGO    Patient Active Problem List   Diagnosis Date Noted  . Primary osteoarthritis of right hip 04/01/2017  . Osteoarthritis of right hip 04/01/2017  . Osteoporosis 03/03/2017  . Decreased libido 10/19/2016  . Spinal stenosis of lumbar region 08/26/2016  . Spinal stenosis at L4-L5 level 08/26/2016  . 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 Diarrhea 09/23/2015  . Bell's palsy 03/02/2015  . 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 Neck pain 04/26/2014  . Herpes zoster 04/05/2014  . Thrush, oral 03/06/2014  . Left arm swelling 01/15/2013  . Cough 09/19/2012  . Insomnia 09/01/2012  . CTS (carpal tunnel syndrome) 04/15/2012  . Arthritis of multiple sites 03/04/2012  . Well adult exam 01/27/2012  . Knee pain, bilateral 01/08/2012  . Hip pain, right 12/26/2010  . DISCITIS 07/25/2010  . SWEATING 05/23/2010  . NECK PAIN 05/20/2010  . PARESTHESIA, HANDS 04/25/2010  . COPD mixed type ([place]) 07/26/2009  . INSOMNIA,  PERSISTENT 08/01/2008  . SINUSITIS, ACUTE 08/01/2008  . RASH AND OTHER NONSPECIFIC SKIN ERUPTION 08/01/2008  . TOBACCO USE DISORDER/SMOKER-SMOKING CESSATION DISCUSSED 03/08/2008  . BRONCHITIS, ACUTE 03/08/2008  . HYPERLIPIDEMIA 02/27/2008  . CRAMP OF LIMB 02/27/2008  . FATIGUE 02/27/2008  . Wheezing 02/27/2008  . Backache 01/23/2008  . MENINGITIS 08/11/2007  . GERD 08/11/2007  . HEADACHE 08/11/2007  . OSTEOARTHRITIS 01/11/2007    Past Surgical History:  Procedure Laterality Date  . ABDOMINAL HYSTERECTOMY     partial  . APPENDECTOMY    . CATARACT EXTRACTION Bilateral    about 3 years ago   . CHOLECYSTECTOMY    . KNEE ARTHROSCOPY  1999   Dr Theda Sers -- R knee  . LUMBAR LAMINECTOMY/DECOMPRESSION MICRODISCECTOMY Right 08/26/2016   Procedure: Microlumbar decompression L4-5, L5-S1 on Right;  Surgeon: Susa Day, MD;  Location: [facility];  Service: Orthopedics;  Laterality: Right;  . TOTAL HIP ARTHROPLASTY Right 04/01/2017   Procedure: RIGHT TOTAL HIP ARTHROPLASTY ANTERIOR APPROACH;  Surgeon: Rod Can, MD;  Location: [facility];  Service: Orthopedics;  Laterality: Right;  Needs RNFA     OB History   None      Home Medications    Prior to Admission medications   Medication Sig Start Date End Date Taking? Authorizing Provider  aspirin EC 81 MG EC tablet Take 1 tablet (81 mg total) by mouth 2 (two) times daily. Patient taking differently: Take 81 mg by mouth daily.  04/02/17  Yes Swinteck, Aaron Edelman, MD  Cholecalciferol (VITAMIN D3) 5000 units TABS  Take 5,000 Units by mouth at bedtime. WITH A GLASS OF VANILLA-HONEY ALMOND MILK   Yes [provider]  Cyanocobalamin (VITAMIN B-12) 1000 MCG/15ML LIQD Take 15 mLs by mouth daily.   Yes [provider]  ENBREL SURECLICK 50 MG/ML injection Inject 50 mg into the skin every Friday.  09/09/17  Yes [provider]  GARLIC PO Take 1 tablet by mouth daily.   Yes [provider]  ipratropium-albuterol (DUONEB)  0.5-2.5 (3) MG/3ML SOLN Take 3 mLs by nebulization every 4 (four) hours as needed. Patient taking differently: Take 3 mLs by nebulization every 4 (four) hours as needed (for shortness of breath/wheezing).  09/23/15  Yes Plotnikov, Evie Lacks, MD  leflunomide (ARAVA) 20 MG tablet Take 20 mg by mouth daily.   Yes [provider]  Melatonin 3 MG TABS Take 3 mg by mouth at bedtime as needed (for sleep).   Yes [provider]  predniSONE (DELTASONE) 1 MG tablet Take 4 tablets (4 mg total) by mouth daily with breakfast. 06/16/17  Yes Plotnikov, Evie Lacks, MD  Probiotic CAPS Take 1 capsule by mouth daily.   Yes [provider]  Pyridoxine HCl (VITAMIN B-6 PO) Take 1 tablet by mouth daily.   Yes [provider]  traMADol (ULTRAM) 50 MG tablet TAKE 1 TO 2 TABLETS BY MOUTH TWICE DAILYAS NEEDED FOR PAIN Patient taking differently: Take 50-100 mg by mouth two times a day as needed for pain 09/06/17  Yes Plotnikov, Evie Lacks, MD  zolpidem (AMBIEN) 10 MG tablet TAKE 1 TABLET BY MOUTH EACH NIGHT AT BEDTIME AS NEEDED FOR SLEEP Patient taking differently: Take 5 mg by mouth at bedtime as needed for sleep 07/27/17  Yes Plotnikov, Evie Lacks, MD  azithromycin (ZITHROMAX) 250 MG tablet 2 tablets once daily for 3 consecutive days Patient not taking: Reported on 09/21/2017 09/07/17   Marletta Lor, MD  senna (SENOKOT) 8.6 MG TABS tablet Take 2 tablets (17.2 mg total) by mouth at bedtime. Patient not taking: Reported on 09/21/2017 04/02/17   Rod Can, MD    Family History Family History  Problem Relation Age of Onset  . Heart disease Mother 71       CHF  . COPD Father 60       emphesema    Social History Social History   Tobacco Use  . Smoking status: Current Every Day Smoker    Packs/day: 0.50    Years: 58.00    Pack years: 29.00    Types: Cigarettes  . Smokeless tobacco: Never Used  Substance Use Topics  . Alcohol use: No  . Drug use: No     Allergies     Aspirin; Codeine sulfate; Lipitor [atorvastatin]; Nabumetone; Senna; and Statins   Review of Systems Review of Systems  Constitutional: Negative for chills, diaphoresis, fever and unexpected weight change.  Respiratory: Negative for cough, chest tightness and shortness of breath.   Cardiovascular: Positive for chest pain. Negative for palpitations and leg swelling.  Gastrointestinal: Positive for nausea. Negative for abdominal pain, constipation, diarrhea and vomiting.  Genitourinary: Negative for dysuria and flank pain.  Musculoskeletal: Negative for back pain.  Skin: Negative for color change and pallor.  Neurological: Negative for dizziness, syncope, weakness and light-headedness.  All other systems reviewed and are negative.    Physical Exam Updated Vital Signs BP (!) 141/67 (BP Location: Right Arm)   Pulse 92   Temp 98.8 F (37.1 C)   Resp 18   Ht 5\' 2"  (  1.575 m)   Wt 54 kg (119 lb)   SpO2 95%   BMI 21.77 kg/m   Physical Exam  Constitutional: She appears well-developed and well-nourished. No distress.  HENT:  Head: Normocephalic and atraumatic.  Eyes: Conjunctivae are normal.  Neck: Neck supple.  Cardiovascular: Normal rate, regular rhythm, normal heart sounds and intact distal pulses.  Pulmonary/Chest: Effort normal and breath sounds normal. No respiratory distress.  Patient appears relaxed.  No increased work of breathing.  Speaks in full sentences without difficulty.  Abdominal: Soft. There is no tenderness. There is no guarding.  Musculoskeletal: She exhibits no edema.  Lymphadenopathy:    She has no cervical adenopathy.  Neurological: She is alert.  Skin: Skin is warm and dry. She is not diaphoretic.  Psychiatric: She has a normal mood and affect. Her behavior is normal.  Nursing note and vitals reviewed.    ED Treatments / Results  Labs (all labs ordered are listed, but only abnormal results are displayed) Labs Reviewed  BASIC METABOLIC PANEL -  Abnormal; Notable for the following components:      Result Value   Chloride 98 (*)    All other components within normal limits  CBC - Abnormal; Notable for the following components:   WBC 12.0 (*)    All other components within normal limits  I-STAT TROPONIN, ED  I-STAT TROPONIN, ED    EKG EKG Interpretation  Date/Time:  Tuesday September 21 2017 11:01:21 EDT Ventricular Rate:  90 PR Interval:  136 QRS Duration: 60 QT Interval:  338 QTC Calculation: 413 R Axis:   77 Text Interpretation:  Normal sinus rhythm Normal ECG Since last tracing rate faster Confirmed by Daleen Bo [phone]) on 09/21/2017 6:19:52 PM   Radiology Dg Chest 2 View  Result Date: 09/21/2017 CLINICAL DATA:  72 year old female with anterior chest pain onset this morning. Smoker. EXAM: CHEST - 2 VIEW COMPARISON:  Chest radiographs 09/17/2015 and earlier. FINDINGS: Mildly lower lung volumes today. Chronic increased bilateral pulmonary interstitial markings appear stable. Mediastinal contours appear stable and within normal limits. Visualized tracheal air column is within normal limits. No pneumothorax, pleural effusion, confluent or acute pulmonary opacity. No acute osseous abnormality identified. Stable cholecystectomy clips. Negative visible bowel gas pattern. IMPRESSION: Chronic interstitial lung changes appear stable since 2017. No acute cardiopulmonary abnormality. Electronically Signed   By: Genevie Ann M.D.   On: 09/21/2017 14:32   Ct Angio Chest Pe W And/or Wo Contrast  Result Date: 09/21/2017 CLINICAL DATA:  72 year old female with acute chest pain today, inspiratory pain. EXAM: CT ANGIOGRAPHY CHEST WITH CONTRAST TECHNIQUE: Multidetector CT imaging of the chest was performed using the standard protocol during bolus administration of intravenous contrast. Multiplanar CT image reconstructions and MIPs were obtained to evaluate the vascular anatomy. CONTRAST:  150mL ISOVUE-370 IOPAMIDOL (ISOVUE-370) INJECTION 76%  COMPARISON:  CT chest abdomen and pelvis 03/23/2006. FINDINGS: Cardiovascular: Excellent contrast bolus timing in the pulmonary arterial tree. No focal filling defect identified in the pulmonary arteries to suggest acute pulmonary embolism. Calcified coronary artery atherosclerosis and Calcified aortic atherosclerosis appear increased since 2007. Negative visible aorta otherwise. Cardiac size is within normal limits. Suggestion of left ventricular hypertrophy. No pericardial effusion. Mediastinum/Nodes: No lymphadenopathy.  Negative thoracic inlet. Lungs/Pleura: The major airways are patent. Lung volumes are lower compared to 2007 and there is bilateral pulmonary mosaic attenuation which might reflect areas of gas trapping superimposed on mild atelectasis. There is mild generalized peribronchial thickening. No pleural effusion and no other abnormal pulmonary opacity.  Upper Abdomen: Negative visible noncontrast liver, spleen, pancreas, left adrenal gland, left kidney, and bowel in the upper abdomen. Musculoskeletal: Negative. Review of the MIP images confirms the above findings. IMPRESSION: 1.  Negative for acute pulmonary embolus. 2. Suspected mild atelectasis and gas trapping in both lungs. Generalized peribronchial thickening which might be infectious or inflammatory. 3. Calcified coronary artery and aortic atherosclerosis. Suggestion of left ventricular hypertrophy. Electronically Signed   By: Genevie Ann M.D.   On: 09/21/2017 18:59    Procedures Procedures (including critical care time)  Medications Ordered in ED Medications  iopamidol (ISOVUE-370) 76 % injection 100 mL (has no administration in time range)  morphine 4 MG/ML injection 2 mg (2 mg Intravenous Given 09/21/17 1813)  iopamidol (ISOVUE-370) 76 % injection (100 mLs  Contrast Given 09/21/17 1836)     Initial Impression / Assessment and Plan / ED Course  I have reviewed the triage vital signs and the nursing notes.  Pertinent labs & imaging  results that were available during my care of the patient were reviewed by me and considered in my medical decision making (see chart for details).  Clinical Course as of Sep 22 2239  Tue Sep 21, 2017  2010 Discussed imagining results with patient. Pain free   [SJ]    Clinical Course User Index [SJ] Madalena Kesecker C, PA-C    Patient presents with atypical sounding chest pain. Low suspicion for ACS. No high risk/suspicious features; no exertional chest pain, vomiting, diaphoresis, or radiation. HEART score is 2, indicating low risk for a cardiac event. Wells criteria score is 3, indicating moderate risk for PE.  CT without evidence of PE.  Delta troponin is negative.  Maintained adequate SPO2 on room air.  Patient's pain resolved and did not recur. She has close PCP follow-up.  May also follow-up with cardiology. The patient was given instructions for home care as well as return precautions. Patient voices understanding of these instructions, accepts the plan, and is comfortable with discharge.   Findings and plan of care discussed with Daleen Bo, MD. Dr. Eulis Foster personally evaluated and examined this patient.  Final Clinical Impressions(s) / ED Diagnoses   Final diagnoses:  Atypical chest pain    ED Discharge Orders    None       Layla Maw 09/21/17 2241    Daleen Bo, MD 09/23/17 1506

## 2017-09-21 NOTE — Discharge Instructions (Addendum)
There were no acute abnormalities found on the CT scan, including no evidence of blood clot. Follow-up with your primary care provider, as planned.  Follow-up with cardiology as well.

## 2017-09-22 ENCOUNTER — Encounter: Payer: Self-pay | Admitting: Internal Medicine

## 2017-09-22 ENCOUNTER — Ambulatory Visit (INDEPENDENT_AMBULATORY_CARE_PROVIDER_SITE_OTHER): Payer: Managed Care, Other (non HMO) | Admitting: Internal Medicine

## 2017-09-22 DIAGNOSIS — R0789 Other chest pain: Secondary | ICD-10-CM | POA: Diagnosis not present

## 2017-09-22 DIAGNOSIS — J449 Chronic obstructive pulmonary disease, unspecified: Secondary | ICD-10-CM

## 2017-09-22 DIAGNOSIS — M130 Polyarthritis, unspecified: Secondary | ICD-10-CM

## 2017-09-22 DIAGNOSIS — I2510 Atherosclerotic heart disease of native coronary artery without angina pectoris: Secondary | ICD-10-CM | POA: Diagnosis not present

## 2017-09-22 DIAGNOSIS — M0600 Rheumatoid arthritis without rheumatoid factor, unspecified site: Secondary | ICD-10-CM | POA: Diagnosis not present

## 2017-09-22 DIAGNOSIS — G4709 Other insomnia: Secondary | ICD-10-CM

## 2017-09-22 DIAGNOSIS — M48061 Spinal stenosis, lumbar region without neurogenic claudication: Secondary | ICD-10-CM

## 2017-09-22 MED ORDER — PRAVASTATIN SODIUM 20 MG PO TABS: 20.0000 mg | ORAL_TABLET | Freq: Every day | ORAL | 11 refills | Status: DC

## 2017-09-22 NOTE — Assessment & Plan Note (Signed)
On Humira+ Prednisone Tramadol prn

## 2017-09-22 NOTE — Assessment & Plan Note (Signed)
Zolpidem prn  Potential benefits of a long term benzodiazepines  use as well as potential risks  and complications were explained to the patient and were aknowledged. 

## 2017-09-22 NOTE — Assessment & Plan Note (Signed)
S/p ER visit, chest CT - 4/19 Cardiol ref

## 2017-09-22 NOTE — Assessment & Plan Note (Signed)
On Humira+ Arava +Prednisone Tramadol prn  Potential benefits of a long term opioids use as well as potential risks (i.e. addiction risk, apnea etc) and complications (i.e. Somnolence, constipation and others) were explained to the patient and were aknowledged.

## 2017-09-22 NOTE — Assessment & Plan Note (Signed)
Tramadol prn ° Potential benefits of a long term opioids use as well as potential risks (i.e. addiction risk, apnea etc) and complications (i.e. Somnolence, constipation and others) were explained to the patient and were aknowledged. ° ° °

## 2017-09-22 NOTE — Assessment & Plan Note (Signed)
Chest CT 4/19 Discussed - stop smoking ASA 81 mg/d Try Pravachol Cardiol ref offered - will schedule

## 2017-09-22 NOTE — Assessment & Plan Note (Signed)
Discussed.

## 2017-09-22 NOTE — Progress Notes (Signed)
Subjective:  Patient ID: Debra Farmer, female    DOB: [birth year]  Age: 72 y.o. MRN: [id number]  CC: No chief complaint on file.   HPI SHANIKKA WONDERS presents for RA, COPD, LBP and insomnia f/u Pt had an ER visit for CP last night   Outpatient Medications Prior to Visit  Medication Sig Dispense Refill  . aspirin EC 81 MG EC tablet Take 1 tablet (81 mg total) by mouth 2 (two) times daily. (Patient taking differently: Take 81 mg by mouth daily. ) 60 tablet 1  . azithromycin (ZITHROMAX) 250 MG tablet 2 tablets once daily for 3 consecutive days (Patient not taking: Reported on 09/21/2017) 6 tablet 0  . Cholecalciferol (VITAMIN D3) 5000 units TABS Take 5,000 Units by mouth at bedtime. WITH A GLASS OF VANILLA-HONEY ALMOND MILK    . Cyanocobalamin (VITAMIN B-12) 1000 MCG/15ML LIQD Take 15 mLs by mouth daily.    Scarlette Shorts SURECLICK 50 MG/ML injection Inject 50 mg into the skin every Friday.     [name] GARLIC PO Take 1 tablet by mouth daily.    [name] ipratropium-albuterol (DUONEB) 0.5-2.5 (3) MG/3ML SOLN Take 3 mLs by nebulization every 4 (four) hours as needed. (Patient taking differently: Take 3 mLs by nebulization every 4 (four) hours as needed (for shortness of breath/wheezing). ) 360 mL 3  . leflunomide (ARAVA) 20 MG tablet Take 20 mg by mouth daily.    . Melatonin 3 MG TABS Take 3 mg by mouth at bedtime as needed (for sleep).    . predniSONE (DELTASONE) 1 MG tablet Take 4 tablets (4 mg total) by mouth daily with breakfast. 120 tablet 1  . Probiotic CAPS Take 1 capsule by mouth daily.    . Pyridoxine HCl (VITAMIN B-6 PO) Take 1 tablet by mouth daily.    [name] senna (SENOKOT) 8.6 MG TABS tablet Take 2 tablets (17.2 mg total) by mouth at bedtime. (Patient not taking: Reported on 09/21/2017) 120 each 0  . traMADol (ULTRAM) 50 MG tablet TAKE 1 TO 2 TABLETS BY MOUTH TWICE DAILYAS NEEDED FOR PAIN (Patient taking differently: Take 50-100 mg by mouth two times a day as needed for pain) 120 tablet 2  .  zolpidem (AMBIEN) 10 MG tablet TAKE 1 TABLET BY MOUTH EACH NIGHT AT BEDTIME AS NEEDED FOR SLEEP (Patient taking differently: Take 5 mg by mouth at bedtime as needed for sleep) 30 tablet 2   No facility-administered medications prior to visit.     ROS Review of Systems  Constitutional: Negative for activity change, appetite change, chills, fatigue and unexpected weight change.  HENT: Negative for congestion, mouth sores and sinus pressure.   Eyes: Negative for visual disturbance.  Respiratory: Negative for cough and chest tightness.   Gastrointestinal: Negative for abdominal pain and nausea.  Genitourinary: Negative for difficulty urinating, frequency and vaginal pain.  Musculoskeletal: Positive for arthralgias and back pain. Negative for gait problem.  Skin: Negative for pallor and rash.  Neurological: Negative for dizziness, tremors, weakness, numbness and headaches.  Psychiatric/Behavioral: Positive for sleep disturbance. Negative for confusion and suicidal ideas. The patient is nervous/anxious.     Objective:  There were no vitals taken for this visit.  BP Readings from Last 3 Encounters:  09/21/17 (!) 119/59  09/07/17 (!) 106/58  04/02/17 132/60    Wt Readings from Last 3 Encounters:  09/21/17 119 lb (54 kg)  09/07/17 118 lb (53.5 kg)  04/01/17 118 lb 9.6 oz (53.8 kg)    Physical Exam  Constitutional: She appears well-developed. No distress.  HENT:  Head: Normocephalic.  Right Ear: External ear normal.  Left Ear: External ear normal.  Nose: Nose normal.  Mouth/Throat: Oropharynx is clear and moist.  Eyes: Pupils are equal, round, and reactive to light. Conjunctivae are normal. Right eye exhibits no discharge. Left eye exhibits no discharge.  Neck: Normal range of motion. Neck supple. No JVD present. No tracheal deviation present. No thyromegaly present.  Cardiovascular: Normal rate, regular rhythm and normal heart sounds.  Pulmonary/Chest: No stridor. No respiratory  distress. She has no wheezes.  Abdominal: Soft. Bowel sounds are normal. She exhibits no distension and no mass. There is no tenderness. There is no rebound and no guarding.  Musculoskeletal: She exhibits tenderness. She exhibits no edema.  Lymphadenopathy:    She has no cervical adenopathy.  Neurological: She displays normal reflexes. No cranial nerve deficit. She exhibits normal muscle tone. Coordination normal.  Skin: No rash noted. No erythema.  Psychiatric: She has a normal mood and affect. Her behavior is normal. Judgment and thought content normal.  LS tender  Lab Results  Component Value Date   WBC 12.0 (H) 09/21/2017   HGB 14.9 09/21/2017   HCT 45.9 09/21/2017   PLT 261 09/21/2017   GLUCOSE 98 09/21/2017   CHOL 257 (H) 12/05/2014   TRIG 219.0 (H) 12/05/2014   HDL 40.30 12/05/2014   LDLDIRECT 170.0 12/05/2014   LDLCALC 166 (H) 09/13/2013   ALT 18 12/05/2014   AST 21 12/05/2014   NA 135 09/21/2017   K 4.7 09/21/2017   CL 98 (L) 09/21/2017   CREATININE 0.69 09/21/2017   BUN 11 09/21/2017   CO2 26 09/21/2017   TSH 1.63 12/05/2014    Dg Chest 2 View  Result Date: 09/21/2017 CLINICAL DATA:  72 year old female with anterior chest pain onset this morning. Smoker. EXAM: CHEST - 2 VIEW COMPARISON:  Chest radiographs 09/17/2015 and earlier. FINDINGS: Mildly lower lung volumes today. Chronic increased bilateral pulmonary interstitial markings appear stable. Mediastinal contours appear stable and within normal limits. Visualized tracheal air column is within normal limits. No pneumothorax, pleural effusion, confluent or acute pulmonary opacity. No acute osseous abnormality identified. Stable cholecystectomy clips. Negative visible bowel gas pattern. IMPRESSION: Chronic interstitial lung changes appear stable since 2017. No acute cardiopulmonary abnormality. Electronically Signed   By: Genevie Ann M.D.   On: 09/21/2017 14:32   Ct Angio Chest Pe W And/or Wo Contrast  Result Date:  09/21/2017 CLINICAL DATA:  72 year old female with acute chest pain today, inspiratory pain. EXAM: CT ANGIOGRAPHY CHEST WITH CONTRAST TECHNIQUE: Multidetector CT imaging of the chest was performed using the standard protocol during bolus administration of intravenous contrast. Multiplanar CT image reconstructions and MIPs were obtained to evaluate the vascular anatomy. CONTRAST:  157mL ISOVUE-370 IOPAMIDOL (ISOVUE-370) INJECTION 76% COMPARISON:  CT chest abdomen and pelvis 03/23/2006. FINDINGS: Cardiovascular: Excellent contrast bolus timing in the pulmonary arterial tree. No focal filling defect identified in the pulmonary arteries to suggest acute pulmonary embolism. Calcified coronary artery atherosclerosis and Calcified aortic atherosclerosis appear increased since 2007. Negative visible aorta otherwise. Cardiac size is within normal limits. Suggestion of left ventricular hypertrophy. No pericardial effusion. Mediastinum/Nodes: No lymphadenopathy.  Negative thoracic inlet. Lungs/Pleura: The major airways are patent. Lung volumes are lower compared to 2007 and there is bilateral pulmonary mosaic attenuation which might reflect areas of gas trapping superimposed on mild atelectasis. There is mild generalized peribronchial thickening. No pleural effusion and no other abnormal pulmonary opacity. Upper Abdomen: Negative  visible noncontrast liver, spleen, pancreas, left adrenal gland, left kidney, and bowel in the upper abdomen. Musculoskeletal: Negative. Review of the MIP images confirms the above findings. IMPRESSION: 1.  Negative for acute pulmonary embolus. 2. Suspected mild atelectasis and gas trapping in both lungs. Generalized peribronchial thickening which might be infectious or inflammatory. 3. Calcified coronary artery and aortic atherosclerosis. Suggestion of left ventricular hypertrophy. Electronically Signed   By: Genevie Ann M.D.   On: 09/21/2017 18:59    Assessment & Plan:   There are no diagnoses  linked to this encounter. I am having Mardene Celeste B. Witman maintain her ipratropium-albuterol, leflunomide, Vitamin D3, aspirin, senna, predniSONE, zolpidem, traMADol, azithromycin, Vitamin B-12, Melatonin, ENBREL SURECLICK, Probiotic, GARLIC PO, and Pyridoxine HCl (VITAMIN B-6 PO).  No orders of the defined types were placed in this encounter.    Follow-up: No follow-ups on file.  Walker Kehr, MD

## 2017-10-12 ENCOUNTER — Telehealth: Payer: Self-pay | Admitting: Internal Medicine

## 2017-10-12 NOTE — Telephone Encounter (Signed)
Patients husband, Silvie Obremski has faxed over FMLA to be completed for intermittent leave. He would like for the time to start on 09/21/17 when his wife was in the ED. You gave him a letter for this before. His job is now needing FMLA to be completed.   I did frequency of 1-2 days a month. Forms have been placed in your box to sign and review. Or if you advise something different.   Thank you.

## 2017-10-12 NOTE — Telephone Encounter (Signed)
Ok Thx 

## 2017-10-13 NOTE — Telephone Encounter (Signed)
Husband called back with the fax number for the forms to be faxed too.  #: [phone]  He requested the original be mailed to him.

## 2017-10-14 NOTE — Telephone Encounter (Signed)
Forms have been faxed, Original mailed to patient, copy sent to scan & charged for under the husband.

## 2017-10-18 ENCOUNTER — Other Ambulatory Visit: Payer: Self-pay | Admitting: Internal Medicine

## 2017-10-19 ENCOUNTER — Encounter: Payer: Self-pay | Admitting: Internal Medicine

## 2017-11-12 ENCOUNTER — Telehealth: Payer: Self-pay | Admitting: Internal Medicine

## 2017-11-12 NOTE — Telephone Encounter (Signed)
Copied from [place] [phone]. Topic: Quick Communication - Rx Refill/Question >> Nov 12, 2017 12:38 PM Synthia Innocent wrote: Medication: predniSONE (DELTASONE) 1 MG tablet   Has the patient contacted their pharmacy? No. Dr Hawkes(rheumatologist) ordering, they are gone for the weekend, patient will run out this weekend (Agent: If no, request that the patient contact the pharmacy for the refill.) (Agent: If yes, when and what did the pharmacy advise?)  Preferred Pharmacy (with phone number or street name): [facility]  Agent: Please be advised that RX refills may take up to 3 business days. We ask that you follow-up with your pharmacy.

## 2017-12-01 ENCOUNTER — Other Ambulatory Visit: Payer: Self-pay | Admitting: Internal Medicine

## 2017-12-02 DIAGNOSIS — M150 Primary generalized (osteo)arthritis: Secondary | ICD-10-CM | POA: Diagnosis not present

## 2017-12-02 DIAGNOSIS — M2550 Pain in unspecified joint: Secondary | ICD-10-CM | POA: Diagnosis not present

## 2017-12-02 DIAGNOSIS — Z79899 Other long term (current) drug therapy: Secondary | ICD-10-CM | POA: Diagnosis not present

## 2017-12-02 DIAGNOSIS — M0609 Rheumatoid arthritis without rheumatoid factor, multiple sites: Secondary | ICD-10-CM | POA: Diagnosis not present

## 2017-12-02 DIAGNOSIS — Z6823 Body mass index (BMI) 23.0-23.9, adult: Secondary | ICD-10-CM | POA: Diagnosis not present

## 2017-12-03 DIAGNOSIS — Z96649 Presence of unspecified artificial hip joint: Secondary | ICD-10-CM | POA: Insufficient documentation

## 2017-12-03 DIAGNOSIS — M7631 Iliotibial band syndrome, right leg: Secondary | ICD-10-CM | POA: Diagnosis not present

## 2018-01-17 DIAGNOSIS — M0609 Rheumatoid arthritis without rheumatoid factor, multiple sites: Secondary | ICD-10-CM | POA: Diagnosis not present

## 2018-02-03 DIAGNOSIS — M419 Scoliosis, unspecified: Secondary | ICD-10-CM | POA: Diagnosis not present

## 2018-02-03 DIAGNOSIS — M5136 Other intervertebral disc degeneration, lumbar region: Secondary | ICD-10-CM | POA: Diagnosis not present

## 2018-02-03 DIAGNOSIS — M545 Low back pain: Secondary | ICD-10-CM | POA: Diagnosis not present

## 2018-02-11 DIAGNOSIS — M545 Low back pain: Secondary | ICD-10-CM | POA: Diagnosis not present

## 2018-02-18 DIAGNOSIS — M4187 Other forms of scoliosis, lumbosacral region: Secondary | ICD-10-CM | POA: Diagnosis not present

## 2018-02-18 DIAGNOSIS — M5136 Other intervertebral disc degeneration, lumbar region: Secondary | ICD-10-CM | POA: Diagnosis not present

## 2018-02-18 DIAGNOSIS — M5416 Radiculopathy, lumbar region: Secondary | ICD-10-CM | POA: Diagnosis not present

## 2018-02-18 DIAGNOSIS — M419 Scoliosis, unspecified: Secondary | ICD-10-CM | POA: Diagnosis not present

## 2018-02-21 ENCOUNTER — Other Ambulatory Visit: Payer: Self-pay | Admitting: Internal Medicine

## 2018-02-23 ENCOUNTER — Other Ambulatory Visit: Payer: Self-pay | Admitting: Internal Medicine

## 2018-02-23 NOTE — Telephone Encounter (Signed)
Copied from [place] [phone]. Topic: General - Other >> Feb 23, 2018  4:14 PM Janace Aris A wrote: Medication: traMADol (ULTRAM) 50 MG tablet [[id number]]   Has the patient contacted their pharmacy? Yes  Agent: If yes, when and what did the pharmacy advise? Pharmacy Says they do not have the RX order as of today.   Preferred Pharmacy (with phone number or street name): [facility] - [place], [place]. [phone] (Phone) [phone] (Fax)

## 2018-02-23 NOTE — Telephone Encounter (Signed)
Pt calling about RX Tramadol she states that she need this filled today please call her at [phone]

## 2018-02-23 NOTE — Telephone Encounter (Signed)
Tramadol refill Last Refill:12/02/17 # 120  refill0 Last OV: 09/22/17 PCP: Plotnikov Pharmacy:[facility], [employer]

## 2018-02-24 NOTE — Telephone Encounter (Signed)
Patient calling and states that she is needing this refill sent to the pharmacy. States that it has been 4 days. Please advise.

## 2018-02-24 NOTE — Telephone Encounter (Signed)
Pt calling in again to check status of RX Tramadol. Tramadol last ordered 12/02/17 #120 with 2 refills. Pt states that she takes 2 tablets 2x day but sometimes takes and extra tablet at night. Sometimes 5 total in a day. Pt is out of medication.  [facility] [place]  - [address]

## 2018-02-24 NOTE — Telephone Encounter (Signed)
Control database checked last refill: 01/25/2018 LOV:09/22/2017 NOV:03/17/2018

## 2018-02-25 NOTE — Telephone Encounter (Signed)
Patient called back.  I advised the [facility] that the request is in process.  She would inform patient.

## 2018-02-25 NOTE — Telephone Encounter (Signed)
Pt called to check status

## 2018-02-25 NOTE — Telephone Encounter (Signed)
RX sent

## 2018-03-17 ENCOUNTER — Encounter: Payer: Self-pay | Admitting: Internal Medicine

## 2018-03-17 ENCOUNTER — Ambulatory Visit (INDEPENDENT_AMBULATORY_CARE_PROVIDER_SITE_OTHER): Payer: Managed Care, Other (non HMO) | Admitting: Internal Medicine

## 2018-03-17 VITALS — BP 128/64 | HR 77 | Temp 98.2°F | Ht 62.0 in | Wt 127.0 lb

## 2018-03-17 DIAGNOSIS — M130 Polyarthritis, unspecified: Secondary | ICD-10-CM

## 2018-03-17 DIAGNOSIS — G4709 Other insomnia: Secondary | ICD-10-CM | POA: Diagnosis not present

## 2018-03-17 DIAGNOSIS — Z0000 Encounter for general adult medical examination without abnormal findings: Secondary | ICD-10-CM | POA: Diagnosis not present

## 2018-03-17 DIAGNOSIS — J449 Chronic obstructive pulmonary disease, unspecified: Secondary | ICD-10-CM | POA: Diagnosis not present

## 2018-03-17 DIAGNOSIS — Z23 Encounter for immunization: Secondary | ICD-10-CM

## 2018-03-17 DIAGNOSIS — I2510 Atherosclerotic heart disease of native coronary artery without angina pectoris: Secondary | ICD-10-CM

## 2018-03-17 DIAGNOSIS — Z1239 Encounter for other screening for malignant neoplasm of breast: Secondary | ICD-10-CM

## 2018-03-17 DIAGNOSIS — M48061 Spinal stenosis, lumbar region without neurogenic claudication: Secondary | ICD-10-CM

## 2018-03-17 MED ORDER — TRAMADOL HCL 50 MG PO TABS: ORAL_TABLET | ORAL | 3 refills | Status: DC

## 2018-03-17 MED ORDER — ZOLPIDEM TARTRATE 10 MG PO TABS: ORAL_TABLET | ORAL | 5 refills | Status: DC

## 2018-03-17 NOTE — Progress Notes (Signed)
Subjective:  Patient ID: MARSHAL SCHRECENGOST, female    DOB: May 07, 1946  Age: 72 y.o. MRN: [id number]  CC: No chief complaint on file.   HPI IZZABELL KLASEN presents for well exam F/u RA - worse (back on Humira x2 mo), Arava, Prednisone The pt quit smoking! F/u LBP  Outpatient Medications Prior to Visit  Medication Sig Dispense Refill  . Adalimumab (HUMIRA Gillespie) Inject into the skin.    [name] aspirin EC 81 MG EC tablet Take 1 tablet (81 mg total) by mouth 2 (two) times daily. (Patient taking differently: Take 81 mg by mouth daily. ) 60 tablet 1  . Cholecalciferol (VITAMIN D3) 5000 units TABS Take 5,000 Units by mouth at bedtime. WITH A GLASS OF VANILLA-HONEY ALMOND MILK    . Cyanocobalamin (VITAMIN B-12) 1000 MCG/15ML LIQD Take 15 mLs by mouth daily.    [name] GARLIC PO Take 1 tablet by mouth daily.    [name] ipratropium-albuterol (DUONEB) 0.5-2.5 (3) MG/3ML SOLN Take 3 mLs by nebulization every 4 (four) hours as needed. (Patient taking differently: Take 3 mLs by nebulization every 4 (four) hours as needed (for shortness of breath/wheezing). ) 360 mL 3  . leflunomide (ARAVA) 20 MG tablet Take 20 mg by mouth daily.    . Melatonin 3 MG TABS Take 3 mg by mouth at bedtime as needed (for sleep).    . pravastatin (PRAVACHOL) 20 MG tablet Take 1 tablet (20 mg total) by mouth daily. 30 tablet 11  . predniSONE (DELTASONE) 1 MG tablet Take 4 tablets (4 mg total) by mouth daily with breakfast. 120 tablet 1  . Probiotic CAPS Take 1 capsule by mouth daily.    . Pyridoxine HCl (VITAMIN B-6 PO) Take 1 tablet by mouth daily.    [name] senna (SENOKOT) 8.6 MG TABS tablet Take 2 tablets (17.2 mg total) by mouth at bedtime. 120 each 0  . traMADol (ULTRAM) 50 MG tablet TAKE 1 TO 2 TABLETS BY MOUTH TWICE DAILYAS NEEDED FOR PAIN 120 tablet 1  . zolpidem (AMBIEN) 10 MG tablet TAKE 1 TABLET BY MOUTH EVERY NIGHT AT BEDTIME AS NEEDED FOR SLEEP 30 tablet 5  . ENBREL SURECLICK 50 MG/ML injection Inject 50 mg into the skin every  Friday.      No facility-administered medications prior to visit.     ROS: Review of Systems  Constitutional: Positive for fatigue. Negative for activity change, appetite change, chills and unexpected weight change.  HENT: Negative for congestion, mouth sores and sinus pressure.   Eyes: Negative for visual disturbance.  Respiratory: Negative for cough and chest tightness.   Gastrointestinal: Negative for abdominal pain and nausea.  Genitourinary: Negative for difficulty urinating, frequency and vaginal pain.  Musculoskeletal: Positive for arthralgias, back pain and gait problem.  Skin: Negative for pallor and rash.  Neurological: Negative for dizziness, tremors, weakness, numbness and headaches.  Psychiatric/Behavioral: Positive for sleep disturbance. Negative for confusion and suicidal ideas. The patient is nervous/anxious.     Objective:  BP 128/64 (BP Location: Left Arm, Patient Position: Sitting, Cuff Size: Normal)   Pulse 77   Temp 98.2 F (36.8 C) (Oral)   Ht 5\' 2"  (1.575 m)   Wt 127 lb (57.6 kg)   SpO2 96%   BMI 23.23 kg/m   BP Readings from Last 3 Encounters:  03/17/18 128/64  09/22/17 124/66  09/21/17 (!) 119/59    Wt Readings from Last 3 Encounters:  03/17/18 127 lb (57.6 kg)  09/22/17 116 lb (52.6 kg)  09/21/17 119 lb (54 kg)    Physical Exam  Constitutional: She appears well-developed. No distress.  HENT:  Head: Normocephalic.  Right Ear: External ear normal.  Left Ear: External ear normal.  Nose: Nose normal.  Mouth/Throat: Oropharynx is clear and moist.  Eyes: Pupils are equal, round, and reactive to light. Conjunctivae are normal. Right eye exhibits no discharge. Left eye exhibits no discharge.  Neck: Normal range of motion. Neck supple. No JVD present. No tracheal deviation present. No thyromegaly present.  Cardiovascular: Normal rate, regular rhythm and normal heart sounds.  Pulmonary/Chest: No stridor. No respiratory distress. She has no wheezes.   Abdominal: Soft. Bowel sounds are normal. She exhibits no distension and no mass. There is no tenderness. There is no rebound and no guarding.  Musculoskeletal: She exhibits edema and tenderness.  Lymphadenopathy:    She has no cervical adenopathy.  Neurological: She displays normal reflexes. No cranial nerve deficit. She exhibits normal muscle tone. Coordination normal.  Skin: No rash noted. No erythema.  Psychiatric: Her behavior is normal. Judgment and thought content normal.  dorsal hands w/swelling and pain LS tender  Lab Results  Component Value Date   WBC 12.0 (H) 09/21/2017   HGB 14.9 09/21/2017   HCT 45.9 09/21/2017   PLT 261 09/21/2017   GLUCOSE 98 09/21/2017   CHOL 257 (H) 12/05/2014   TRIG 219.0 (H) 12/05/2014   HDL 40.30 12/05/2014   LDLDIRECT 170.0 12/05/2014   LDLCALC 166 (H) 09/13/2013   ALT 18 12/05/2014   AST 21 12/05/2014   NA 135 09/21/2017   K 4.7 09/21/2017   CL 98 (L) 09/21/2017   CREATININE 0.69 09/21/2017   BUN 11 09/21/2017   CO2 26 09/21/2017   TSH 1.63 12/05/2014    Dg Chest 2 View  Result Date: 09/21/2017 CLINICAL DATA:  72 year old female with anterior chest pain onset this morning. Smoker. EXAM: CHEST - 2 VIEW COMPARISON:  Chest radiographs 09/17/2015 and earlier. FINDINGS: Mildly lower lung volumes today. Chronic increased bilateral pulmonary interstitial markings appear stable. Mediastinal contours appear stable and within normal limits. Visualized tracheal air column is within normal limits. No pneumothorax, pleural effusion, confluent or acute pulmonary opacity. No acute osseous abnormality identified. Stable cholecystectomy clips. Negative visible bowel gas pattern. IMPRESSION: Chronic interstitial lung changes appear stable since 2017. No acute cardiopulmonary abnormality. Electronically Signed   By: Genevie Ann M.D.   On: 09/21/2017 14:32   Ct Angio Chest Pe W And/or Wo Contrast  Result Date: 09/21/2017 CLINICAL DATA:  72 year old female with  acute chest pain today, inspiratory pain. EXAM: CT ANGIOGRAPHY CHEST WITH CONTRAST TECHNIQUE: Multidetector CT imaging of the chest was performed using the standard protocol during bolus administration of intravenous contrast. Multiplanar CT image reconstructions and MIPs were obtained to evaluate the vascular anatomy. CONTRAST:  161mL ISOVUE-370 IOPAMIDOL (ISOVUE-370) INJECTION 76% COMPARISON:  CT chest abdomen and pelvis 03/23/2006. FINDINGS: Cardiovascular: Excellent contrast bolus timing in the pulmonary arterial tree. No focal filling defect identified in the pulmonary arteries to suggest acute pulmonary embolism. Calcified coronary artery atherosclerosis and Calcified aortic atherosclerosis appear increased since 2007. Negative visible aorta otherwise. Cardiac size is within normal limits. Suggestion of left ventricular hypertrophy. No pericardial effusion. Mediastinum/Nodes: No lymphadenopathy.  Negative thoracic inlet. Lungs/Pleura: The major airways are patent. Lung volumes are lower compared to 2007 and there is bilateral pulmonary mosaic attenuation which might reflect areas of gas trapping superimposed on mild atelectasis. There is mild generalized peribronchial thickening. No pleural effusion and no  other abnormal pulmonary opacity. Upper Abdomen: Negative visible noncontrast liver, spleen, pancreas, left adrenal gland, left kidney, and bowel in the upper abdomen. Musculoskeletal: Negative. Review of the MIP images confirms the above findings. IMPRESSION: 1.  Negative for acute pulmonary embolus. 2. Suspected mild atelectasis and gas trapping in both lungs. Generalized peribronchial thickening which might be infectious or inflammatory. 3. Calcified coronary artery and aortic atherosclerosis. Suggestion of left ventricular hypertrophy. Electronically Signed   By: Genevie Ann M.D.   On: 09/21/2017 18:59    Assessment & Plan:   Diagnoses and all orders for this visit:  Need for influenza vaccination -      Flu vaccine HIGH DOSE PF (Fluzone High dose)     No orders of the defined types were placed in this encounter.    Follow-up: No follow-ups on file.  Walker Kehr, MD

## 2018-03-17 NOTE — Assessment & Plan Note (Addendum)
Discussed increased MI risks w/RA ASA 81 mg/d Pt stopped smoking 9/19

## 2018-03-17 NOTE — Assessment & Plan Note (Addendum)
Here for medicare wellness/physical  Diet: heart healthy  Physical activity: not sedentary  Depression/mood screen: negative  Hearing: intact to whispered voice  Visual acuity: grossly normal, performs annual eye exam  ADLs: capable  Fall risk: low to none  Home safety: good  Cognitive evaluation: intact to orientation, naming, recall and repetition  EOL planning: adv directives, full code/ I agree  I have personally reviewed and have noted  1. The patient's medical, surgical and social history  2. Their use of alcohol, tobacco or illicit drugs  3. Their current medications and supplements  4. The patient's functional ability including ADL's, fall risks, home safety risks and hearing or visual impairment.  5. Diet and physical activities  6. Evidence for depression or mood disorders 7. The roster of all physicians providing medical care to patient - is listed in the Snapshot section of the chart and reviewed today.    Today patient counseled on age appropriate routine health concerns for screening and prevention, each reviewed and up to date or declined. Immunizations reviewed and up to date or declined. Labs ordered and reviewed. Risk factors for depression reviewed and negative. Hearing function and visual acuity are intact. ADLs screened and addressed as needed. Functional ability and level of safety reviewed and appropriate. Education, counseling and referrals performed based on assessed risks today. Patient provided with a copy of personalized plan for preventive services.  Mammogram S/p complete hysterectomy Colonoscopy or cologuard adviced

## 2018-03-17 NOTE — Assessment & Plan Note (Signed)
Quit in 9/19

## 2018-03-17 NOTE — Assessment & Plan Note (Signed)
Zolpidem prn  Potential benefits of a long term benzodiazepines  use as well as potential risks  and complications were explained to the patient and were aknowledged. 

## 2018-03-17 NOTE — Patient Instructions (Signed)

## 2018-03-17 NOTE — Assessment & Plan Note (Signed)
Tramadol prn ° Potential benefits of a long term opioids use as well as potential risks (i.e. addiction risk, apnea etc) and complications (i.e. Somnolence, constipation and others) were explained to the patient and were aknowledged. ° ° °

## 2018-03-23 LAB — CBC AND DIFFERENTIAL
HEMATOCRIT: 39 (ref 36–46)
Hemoglobin: 13.4 (ref 12.0–16.0)
NEUTROS ABS: 8
PLATELETS: 328 (ref 150–399)
WBC: 9.2

## 2018-03-23 LAB — HEPATIC FUNCTION PANEL
ALK PHOS: 102 (ref 25–125)
ALT: 20 (ref 7–35)
AST: 25 (ref 13–35)
BILIRUBIN, TOTAL: 0.3

## 2018-03-23 LAB — BASIC METABOLIC PANEL
BUN: 16 (ref 4–21)
CREATININE: 0.6 (ref 0.5–1.1)
GLUCOSE: 96
Potassium: 4.6 (ref 3.4–5.3)
Sodium: 136 — AB (ref 137–147)

## 2018-03-23 LAB — LIPID PANEL
Cholesterol: 254 — AB (ref 0–200)
HDL: 57 (ref 35–70)
LDL Cholesterol: 172
Triglycerides: 124 (ref 40–160)

## 2018-03-24 ENCOUNTER — Telehealth: Payer: Self-pay | Admitting: Internal Medicine

## 2018-03-24 NOTE — Telephone Encounter (Signed)
Reason for CRM: Patient is calling to request that his FMLA paper work that was completed for his wife Chante Mayson) care on 10-2017. Be extended to 4 days a month. He stated his wife is still not doing well. He is requesting the note be faxed to this employer.  fax number for the forms to be faxed too.   #: [phone]    He is requesting a call back once completed     Please advise   Spouse is making this request [facility], Please advise is okay to make this change. Thank you.

## 2018-03-25 NOTE — Telephone Encounter (Signed)
Pls fill out Thx

## 2018-03-25 NOTE — Telephone Encounter (Signed)
Form has been updated and faxed to employer. Copy sent to scan.   LVM to inform Dellis Filbert this has been done.

## 2018-04-26 DIAGNOSIS — M47816 Spondylosis without myelopathy or radiculopathy, lumbar region: Secondary | ICD-10-CM | POA: Diagnosis not present

## 2018-06-16 DIAGNOSIS — I739 Peripheral vascular disease, unspecified: Secondary | ICD-10-CM | POA: Insufficient documentation

## 2018-06-16 DIAGNOSIS — M4310 Spondylolisthesis, site unspecified: Secondary | ICD-10-CM | POA: Insufficient documentation

## 2018-06-20 ENCOUNTER — Telehealth: Payer: Self-pay | Admitting: Internal Medicine

## 2018-06-20 NOTE — Telephone Encounter (Signed)
Copied from [place] [phone]. Topic: General - Other >> Jun 20, 2018 11:21 AM Rutherford Nail, NT wrote: Reason for CRM: Sonja with [facility] calling and states that Dr Marlou Sa is wanting a copy of the patient's last bones density results. Please advise.   Fax#: [phone] CB#: [phone] ext [phone]

## 2018-06-20 NOTE — Telephone Encounter (Signed)
dexa faxed

## 2018-06-21 DIAGNOSIS — M8580 Other specified disorders of bone density and structure, unspecified site: Secondary | ICD-10-CM | POA: Insufficient documentation

## 2018-06-27 DIAGNOSIS — M0609 Rheumatoid arthritis without rheumatoid factor, multiple sites: Secondary | ICD-10-CM | POA: Diagnosis not present

## 2018-06-27 DIAGNOSIS — M2550 Pain in unspecified joint: Secondary | ICD-10-CM | POA: Diagnosis not present

## 2018-06-27 DIAGNOSIS — M150 Primary generalized (osteo)arthritis: Secondary | ICD-10-CM | POA: Diagnosis not present

## 2018-06-27 DIAGNOSIS — Z6825 Body mass index (BMI) 25.0-25.9, adult: Secondary | ICD-10-CM | POA: Diagnosis not present

## 2018-06-27 DIAGNOSIS — E663 Overweight: Secondary | ICD-10-CM | POA: Diagnosis not present

## 2018-06-27 DIAGNOSIS — M5432 Sciatica, left side: Secondary | ICD-10-CM | POA: Diagnosis not present

## 2018-06-28 DIAGNOSIS — G629 Polyneuropathy, unspecified: Secondary | ICD-10-CM | POA: Diagnosis not present

## 2018-07-07 DIAGNOSIS — M0579 Rheumatoid arthritis with rheumatoid factor of multiple sites without organ or systems involvement: Secondary | ICD-10-CM | POA: Diagnosis not present

## 2018-07-13 DIAGNOSIS — M47816 Spondylosis without myelopathy or radiculopathy, lumbar region: Secondary | ICD-10-CM | POA: Diagnosis not present

## 2018-07-13 DIAGNOSIS — M8580 Other specified disorders of bone density and structure, unspecified site: Secondary | ICD-10-CM | POA: Diagnosis not present

## 2018-07-13 DIAGNOSIS — M4187 Other forms of scoliosis, lumbosacral region: Secondary | ICD-10-CM | POA: Diagnosis not present

## 2018-07-13 DIAGNOSIS — M419 Scoliosis, unspecified: Secondary | ICD-10-CM | POA: Diagnosis not present

## 2018-07-21 DIAGNOSIS — M0579 Rheumatoid arthritis with rheumatoid factor of multiple sites without organ or systems involvement: Secondary | ICD-10-CM | POA: Diagnosis not present

## 2018-08-02 DIAGNOSIS — J111 Influenza due to unidentified influenza virus with other respiratory manifestations: Secondary | ICD-10-CM | POA: Diagnosis not present

## 2018-08-10 ENCOUNTER — Other Ambulatory Visit: Payer: Self-pay | Admitting: Internal Medicine

## 2018-08-15 ENCOUNTER — Other Ambulatory Visit: Payer: Self-pay | Admitting: Internal Medicine

## 2018-08-15 MED ORDER — TRAMADOL HCL 50 MG PO TABS: ORAL_TABLET | ORAL | 3 refills | Status: DC

## 2018-08-15 NOTE — Telephone Encounter (Signed)
Pt has called about refill again. States she is completely out of the medication. Please advise.

## 2018-08-15 NOTE — Addendum Note (Signed)
Addended by: Elliot Cousin on: 08/15/2018 12:53 PM   Modules accepted: Orders

## 2018-08-15 NOTE — Telephone Encounter (Signed)
Patient is checking on the status of her Tramadol refill she requested originally on 08/10/2018.

## 2018-08-15 NOTE — Telephone Encounter (Signed)
Copied from [place] [phone]. Topic: Quick Communication - Rx Refill/Question >> Aug 15, 2018 12:43 PM Blase Mess A wrote: Medication: traMADol (ULTRAM) 50 MG tablet [[id number]]   Has the patient contacted their pharmacy? Yes  (Agent: If no, request that the patient contact the pharmacy for the refill.) (Agent: If yes, when and what did the pharmacy advise?)  Preferred Pharmacy (with phone number or street name): [facility] - [place], [place]. [phone] (Phone) [phone] (Fax)  Agent: Please be advised that RX refills may take up to 3 business days. We ask that you follow-up with your pharmacy.

## 2018-08-18 DIAGNOSIS — M0579 Rheumatoid arthritis with rheumatoid factor of multiple sites without organ or systems involvement: Secondary | ICD-10-CM | POA: Diagnosis not present

## 2018-09-01 ENCOUNTER — Ambulatory Visit: Payer: Self-pay | Admitting: *Deleted

## 2018-09-01 NOTE — Telephone Encounter (Signed)
Pt wants to know if she should receive a letter in the mail writing her out of work due to her having a weakened immune system and the probability of being exposed to the corona virus. 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 Pt denies any recent travel to the High Risk areas of COVID-19 exposure and also denies any contact with someone who was recently tested positive for COVID-19. Pt currently has no symptoms. Pt advised that she would not be considered High Risk for developing COVID-19 at this time and would not require isolation. Pt advised that if symptoms did develop to return call to office for additional recommendations. Understanding verbalized.  Reason for Disposition . [1] No COVID-19 EXPOSURE BUT [2] questions about  Answer Assessment - Initial Assessment Questions 1. CONFIRMED CASE: "Who is the person with the confirmed COVID-19 infection that you were exposed to?"     Not exposed 2. PLACE of CONTACT: "Where were you when you were exposed to COVID-19  (coronavirus disease 2019)?" (e.g., city, state, country)     No contact 3. TYPE of CONTACT: "How much contact was there?" (e.g., live in same house, work in same office, same school)     n/a 4. DATE of CONTACT: "When did you have contact with a coronavirus patient?" (e.g., days)     n/a 5. DURATION of CONTACT: "How long were you in contact with the COVID-19 (coronavirus disease) patient?" (e.g., a few seconds, passed by person, a few minutes, live with the patient)     n/a 6. SYMPTOMS: "Do you have any symptoms?" (e.g., fever, cough, breathing difficulty)     Denies any symptoms 7. HIGH RISK: "Do you have any heart or lung problems? 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

## 2018-09-02 ENCOUNTER — Ambulatory Visit: Payer: Self-pay

## 2018-09-02 NOTE — Telephone Encounter (Signed)
See triage note 3/19 @ 5:24 PM; pt. spoke with a nurse and had questions answered at that time.  No further Triage is needed at this time.

## 2018-09-02 NOTE — Telephone Encounter (Signed)
rec'd call from pt. On Coronavirus message line; reported with hx of COPD, and being on Remicade for Arthritis, had questions about contracting Coronavirus.  Also asking if she should stay home from work, and if this is recommended, can she have a work excuse.    Attempted to call pt.  Left voice message to call office to speak to nurse.

## 2018-09-13 DIAGNOSIS — M150 Primary generalized (osteo)arthritis: Secondary | ICD-10-CM | POA: Diagnosis not present

## 2018-09-13 DIAGNOSIS — M2550 Pain in unspecified joint: Secondary | ICD-10-CM | POA: Diagnosis not present

## 2018-09-13 DIAGNOSIS — M5432 Sciatica, left side: Secondary | ICD-10-CM | POA: Diagnosis not present

## 2018-09-13 DIAGNOSIS — M0609 Rheumatoid arthritis without rheumatoid factor, multiple sites: Secondary | ICD-10-CM | POA: Diagnosis not present

## 2018-09-30 ENCOUNTER — Other Ambulatory Visit: Payer: Self-pay | Admitting: Internal Medicine

## 2018-09-30 NOTE — Telephone Encounter (Signed)
Control database checked last refill: 08/12/2018 LOV:03/17/2018 for well exam BPJ:PETK

## 2018-10-13 DIAGNOSIS — M0579 Rheumatoid arthritis with rheumatoid factor of multiple sites without organ or systems involvement: Secondary | ICD-10-CM | POA: Diagnosis not present

## 2018-11-17 ENCOUNTER — Encounter: Payer: Self-pay | Admitting: Internal Medicine

## 2018-11-17 ENCOUNTER — Ambulatory Visit (INDEPENDENT_AMBULATORY_CARE_PROVIDER_SITE_OTHER): Payer: 59 | Admitting: Internal Medicine

## 2018-11-17 ENCOUNTER — Other Ambulatory Visit: Payer: Self-pay

## 2018-11-17 DIAGNOSIS — M48061 Spinal stenosis, lumbar region without neurogenic claudication: Secondary | ICD-10-CM | POA: Diagnosis not present

## 2018-11-17 DIAGNOSIS — M0600 Rheumatoid arthritis without rheumatoid factor, unspecified site: Secondary | ICD-10-CM | POA: Diagnosis not present

## 2018-11-17 DIAGNOSIS — J449 Chronic obstructive pulmonary disease, unspecified: Secondary | ICD-10-CM | POA: Diagnosis not present

## 2018-11-17 DIAGNOSIS — G4709 Other insomnia: Secondary | ICD-10-CM

## 2018-11-17 MED ORDER — ZOLPIDEM TARTRATE 10 MG PO TABS: 10.0000 mg | ORAL_TABLET | Freq: Every evening | ORAL | 1 refills | Status: DC | PRN

## 2018-11-17 MED ORDER — TRAMADOL HCL 50 MG PO TABS: ORAL_TABLET | ORAL | 3 refills | Status: DC

## 2018-11-17 NOTE — Assessment & Plan Note (Signed)
On Remicade (Humira not covered) + Arava +Prednisone Tramadol prn  Potential benefits of a long term opioids use as well as potential risks (i.e. addiction risk, apnea etc) and complications (i.e. Somnolence, constipation and others) were explained to the patient and were aknowledged.

## 2018-11-17 NOTE — Assessment & Plan Note (Signed)
No Rx - stopped smoking

## 2018-11-17 NOTE — Progress Notes (Signed)
Subjective:  Patient ID: Debra Farmer, female    DOB: [birth year]  Age: 73 y.o. MRN: [id number]  CC: No chief complaint on file.   HPI Debra Farmer presents for RA, insomnia, dyslipidemia f/u Not smoking x12 mo   Outpatient Medications Prior to Visit  Medication Sig Dispense Refill   Adalimumab (HUMIRA [place]) Inject into the skin.     aspirin EC 81 MG EC tablet Take 1 tablet (81 mg total) by mouth 2 (two) times daily. (Patient taking differently: Take 81 mg by mouth daily. ) 60 tablet 1   Cholecalciferol (VITAMIN D3) 5000 units TABS Take 5,000 Units by mouth at bedtime. WITH A GLASS OF VANILLA-HONEY ALMOND MILK     Cyanocobalamin (VITAMIN B-12) 1000 MCG/15ML LIQD Take 15 mLs by mouth daily.     GARLIC PO Take 1 tablet by mouth daily.     ipratropium-albuterol (DUONEB) 0.5-2.5 (3) MG/3ML SOLN Take 3 mLs by nebulization every 4 (four) hours as needed. (Patient taking differently: Take 3 mLs by nebulization every 4 (four) hours as needed (for shortness of breath/wheezing). ) 360 mL 3   leflunomide (ARAVA) 20 MG tablet Take 20 mg by mouth daily.     Melatonin 3 MG TABS Take 3 mg by mouth at bedtime as needed (for sleep).     pravastatin (PRAVACHOL) 20 MG tablet Take 1 tablet (20 mg total) by mouth daily. 30 tablet 11   predniSONE (DELTASONE) 1 MG tablet Take 4 tablets (4 mg total) by mouth daily with breakfast. 120 tablet 1   Probiotic CAPS Take 1 capsule by mouth daily.     Pyridoxine HCl (VITAMIN B-6 PO) Take 1 tablet by mouth daily.     senna (SENOKOT) 8.6 MG TABS tablet Take 2 tablets (17.2 mg total) by mouth at bedtime. 120 each 0   traMADol (ULTRAM) 50 MG tablet TAKE 1 TO 2 TABLETS BY MOUTH TWICE DAILYAS NEEDED FOR PAIN 120 tablet 3   zolpidem (AMBIEN) 10 MG tablet TAKE 1 TABLET BY MOUTH EVERY NIGHT AT BEDTIME AS NEEDED FOR SLEEP 30 tablet 3   No facility-administered medications prior to visit.     ROS: Review of Systems  Constitutional: Positive for  fatigue. Negative for activity change, appetite change, chills and unexpected weight change.  HENT: Negative for congestion, mouth sores and sinus pressure.   Eyes: Negative for visual disturbance.  Respiratory: Negative for cough and chest tightness.   Gastrointestinal: Negative for abdominal pain and nausea.  Genitourinary: Negative for difficulty urinating, frequency and vaginal pain.  Musculoskeletal: Positive for arthralgias, back pain and gait problem.  Skin: Negative for pallor and rash.  Neurological: Negative for dizziness, tremors, weakness, numbness and headaches.  Psychiatric/Behavioral: Positive for sleep disturbance. Negative for confusion and suicidal ideas. The patient is nervous/anxious.     Objective:  BP 118/64 (BP Location: Left Arm, Patient Position: Sitting, Cuff Size: Normal)    Pulse 94    Temp 98.1 F (36.7 C) (Oral)    Ht 5\' 2"  (1.575 m)    Wt 133 lb (60.3 kg)    SpO2 92%    BMI 24.33 kg/m   BP Readings from Last 3 Encounters:  11/17/18 118/64  03/17/18 128/64  09/22/17 124/66    Wt Readings from Last 3 Encounters:  11/17/18 133 lb (60.3 kg)  03/17/18 127 lb (57.6 kg)  09/22/17 116 lb (52.6 kg)    Physical Exam Constitutional:      General: She is not in acute distress.  Appearance: She is well-developed.  HENT:     Head: Normocephalic.     Right Ear: External ear normal.     Left Ear: External ear normal.     Nose: Nose normal.  Eyes:     General:        Right eye: No discharge.        Left eye: No discharge.     Conjunctiva/sclera: Conjunctivae normal.     Pupils: Pupils are equal, round, and reactive to light.  Neck:     Musculoskeletal: Normal range of motion and neck supple.     Thyroid: No thyromegaly.     Vascular: No JVD.     Trachea: No tracheal deviation.  Cardiovascular:     Rate and Rhythm: Normal rate and regular rhythm.     Heart sounds: Normal heart sounds.  Pulmonary:     Effort: No respiratory distress.     Breath  sounds: No stridor. No wheezing.  Abdominal:     General: Bowel sounds are normal. There is no distension.     Palpations: Abdomen is soft. There is no mass.     Tenderness: There is no abdominal tenderness. There is no guarding or rebound.  Musculoskeletal:        General: Swelling, tenderness and deformity present.  Lymphadenopathy:     Cervical: No cervical adenopathy.  Skin:    Findings: Bruising present. No erythema or rash.  Neurological:     Cranial Nerves: No cranial nerve deficit.     Motor: No abnormal muscle tone.     Coordination: Coordination normal.     Deep Tendon Reflexes: Reflexes normal.  Psychiatric:        Behavior: Behavior normal.        Thought Content: Thought content normal.        Judgment: Judgment normal.     Lab Results  Component Value Date   WBC 9.2 03/23/2018   HGB 13.4 03/23/2018   HCT 39 03/23/2018   PLT 328 03/23/2018   GLUCOSE 98 09/21/2017   CHOL 254 (A) 03/23/2018   TRIG 124 03/23/2018   HDL 57 03/23/2018   LDLDIRECT 170.0 12/05/2014   LDLCALC 172 03/23/2018   ALT 20 03/23/2018   AST 25 03/23/2018   NA 136 (A) 03/23/2018   K 4.6 03/23/2018   CL 98 (L) 09/21/2017   CREATININE 0.6 03/23/2018   BUN 16 03/23/2018   CO2 26 09/21/2017   TSH 1.63 12/05/2014    Dg Chest 2 View  Result Date: 09/21/2017 CLINICAL DATA:  73 year old female with anterior chest pain onset this morning. Smoker. EXAM: CHEST - 2 VIEW COMPARISON:  Chest radiographs 09/17/2015 and earlier. FINDINGS: Mildly lower lung volumes today. Chronic increased bilateral pulmonary interstitial markings appear stable. Mediastinal contours appear stable and within normal limits. Visualized tracheal air column is within normal limits. No pneumothorax, pleural effusion, confluent or acute pulmonary opacity. No acute osseous abnormality identified. Stable cholecystectomy clips. Negative visible bowel gas pattern. IMPRESSION: Chronic interstitial lung changes appear stable since 2017.  No acute cardiopulmonary abnormality. Electronically Signed   By: Genevie Ann M.D.   On: 09/21/2017 14:32   Ct Angio Chest Pe W And/or Wo Contrast  Result Date: 09/21/2017 CLINICAL DATA:  73 year old female with acute chest pain today, inspiratory pain. EXAM: CT ANGIOGRAPHY CHEST WITH CONTRAST TECHNIQUE: Multidetector CT imaging of the chest was performed using the standard protocol during bolus administration of intravenous contrast. Multiplanar CT image reconstructions and MIPs were obtained  to evaluate the vascular anatomy. CONTRAST:  133mL ISOVUE-370 IOPAMIDOL (ISOVUE-370) INJECTION 76% COMPARISON:  CT chest abdomen and pelvis 03/23/2006. FINDINGS: Cardiovascular: Excellent contrast bolus timing in the pulmonary arterial tree. No focal filling defect identified in the pulmonary arteries to suggest acute pulmonary embolism. Calcified coronary artery atherosclerosis and Calcified aortic atherosclerosis appear increased since 2007. Negative visible aorta otherwise. Cardiac size is within normal limits. Suggestion of left ventricular hypertrophy. No pericardial effusion. Mediastinum/Nodes: No lymphadenopathy.  Negative thoracic inlet. Lungs/Pleura: The major airways are patent. Lung volumes are lower compared to 2007 and there is bilateral pulmonary mosaic attenuation which might reflect areas of gas trapping superimposed on mild atelectasis. There is mild generalized peribronchial thickening. No pleural effusion and no other abnormal pulmonary opacity. Upper Abdomen: Negative visible noncontrast liver, spleen, pancreas, left adrenal gland, left kidney, and bowel in the upper abdomen. Musculoskeletal: Negative. Review of the MIP images confirms the above findings. IMPRESSION: 1.  Negative for acute pulmonary embolus. 2. Suspected mild atelectasis and gas trapping in both lungs. Generalized peribronchial thickening which might be infectious or inflammatory. 3. Calcified coronary artery and aortic atherosclerosis.  Suggestion of left ventricular hypertrophy. Electronically Signed   By: Genevie Ann M.D.   On: 09/21/2017 18:59    Assessment & Plan:   There are no diagnoses linked to this encounter.   No orders of the defined types were placed in this encounter.    Follow-up: No follow-ups on file.  Walker Kehr, MD

## 2018-11-17 NOTE — Assessment & Plan Note (Signed)
Tramadol prn ° Potential benefits of a long term opioids use as well as potential risks (i.e. addiction risk, apnea etc) and complications (i.e. Somnolence, constipation and others) were explained to the patient and were aknowledged. ° ° °

## 2018-11-17 NOTE — Assessment & Plan Note (Signed)
Zolpidem prn  Potential benefits of a long term benzodiazepines  use as well as potential risks  and complications were explained to the patient and were aknowledged. 

## 2018-12-15 DIAGNOSIS — M2550 Pain in unspecified joint: Secondary | ICD-10-CM | POA: Diagnosis not present

## 2018-12-15 DIAGNOSIS — M0609 Rheumatoid arthritis without rheumatoid factor, multiple sites: Secondary | ICD-10-CM | POA: Diagnosis not present

## 2018-12-15 DIAGNOSIS — Z6826 Body mass index (BMI) 26.0-26.9, adult: Secondary | ICD-10-CM | POA: Diagnosis not present

## 2018-12-15 DIAGNOSIS — M5432 Sciatica, left side: Secondary | ICD-10-CM | POA: Diagnosis not present

## 2018-12-15 DIAGNOSIS — M150 Primary generalized (osteo)arthritis: Secondary | ICD-10-CM | POA: Diagnosis not present

## 2018-12-22 ENCOUNTER — Telehealth: Payer: Self-pay

## 2018-12-22 DIAGNOSIS — N8110 Cystocele, unspecified: Secondary | ICD-10-CM

## 2018-12-22 NOTE — Telephone Encounter (Signed)
Copied from [place] [id number]. Topic: General - Inquiry >> Dec 22, 2018  9:37 AM Virl Axe D wrote: Reason for CRM: Pt would like to know if Dr. Alain Marion can suggest a female gynecologist. Please advise [phone]

## 2018-12-22 NOTE — Telephone Encounter (Signed)
I'll refer. What is the problem? Thx

## 2018-12-23 NOTE — Telephone Encounter (Signed)
LMTCB

## 2018-12-26 NOTE — Telephone Encounter (Signed)
referral for gynecology. Patient feels that her bladder is at the mount of her vagina and she has a discharge. Patient would like to be referred to Dr. Gaetano Net at [phone]

## 2018-12-27 NOTE — Telephone Encounter (Signed)
Will do Thx 

## 2018-12-27 NOTE — Telephone Encounter (Signed)
LM notifying pt referral was placed

## 2018-12-27 NOTE — Addendum Note (Signed)
Addended by: Cassandria Anger on: 12/27/2018 07:10 AM   Modules accepted: Orders

## 2018-12-30 DIAGNOSIS — M25572 Pain in left ankle and joints of left foot: Secondary | ICD-10-CM | POA: Diagnosis not present

## 2018-12-30 DIAGNOSIS — M79672 Pain in left foot: Secondary | ICD-10-CM | POA: Diagnosis not present

## 2018-12-30 DIAGNOSIS — B351 Tinea unguium: Secondary | ICD-10-CM | POA: Diagnosis not present

## 2018-12-30 DIAGNOSIS — G5752 Tarsal tunnel syndrome, left lower limb: Secondary | ICD-10-CM | POA: Diagnosis not present

## 2019-01-04 DIAGNOSIS — N952 Postmenopausal atrophic vaginitis: Secondary | ICD-10-CM | POA: Diagnosis not present

## 2019-01-04 DIAGNOSIS — N816 Rectocele: Secondary | ICD-10-CM | POA: Diagnosis not present

## 2019-01-04 DIAGNOSIS — N760 Acute vaginitis: Secondary | ICD-10-CM | POA: Diagnosis not present

## 2019-01-10 DIAGNOSIS — M25572 Pain in left ankle and joints of left foot: Secondary | ICD-10-CM | POA: Diagnosis not present

## 2019-01-16 DIAGNOSIS — G629 Polyneuropathy, unspecified: Secondary | ICD-10-CM | POA: Diagnosis not present

## 2019-01-16 DIAGNOSIS — M79672 Pain in left foot: Secondary | ICD-10-CM | POA: Diagnosis not present

## 2019-01-16 DIAGNOSIS — M069 Rheumatoid arthritis, unspecified: Secondary | ICD-10-CM | POA: Diagnosis not present

## 2019-02-16 DIAGNOSIS — Z01419 Encounter for gynecological examination (general) (routine) without abnormal findings: Secondary | ICD-10-CM | POA: Diagnosis not present

## 2019-02-16 DIAGNOSIS — N952 Postmenopausal atrophic vaginitis: Secondary | ICD-10-CM | POA: Diagnosis not present

## 2019-02-16 DIAGNOSIS — Z6826 Body mass index (BMI) 26.0-26.9, adult: Secondary | ICD-10-CM | POA: Diagnosis not present

## 2019-03-28 ENCOUNTER — Other Ambulatory Visit: Payer: Self-pay | Admitting: Internal Medicine

## 2019-03-28 NOTE — Telephone Encounter (Signed)
[place] Controlled Database Checked Last filled: 03/01/19 #120 LOV w/you: 11/17/18 Next appt w/you: 04/18/19

## 2019-03-30 ENCOUNTER — Other Ambulatory Visit: Payer: Self-pay | Admitting: Internal Medicine

## 2019-03-30 NOTE — Telephone Encounter (Signed)
Medication: traMADol (ULTRAM) 50 MG tablet [id number]  Patient will run out of medication on 03/31/2019  Has the patient contacted their pharmacy? Yes  (Agent: If no, request that the patient contact the pharmacy for the refill.) (Agent: If yes, when and what did the pharmacy advise?)  Preferred Pharmacy (with phone number or street name): [facility] - [place], [place]. [phone] (Phone) [phone] (Fax)    Agent: Please be advised that RX refills may take up to 3 business days. We ask that you follow-up with your pharmacy.

## 2019-03-31 MED ORDER — TRAMADOL HCL 50 MG PO TABS: ORAL_TABLET | ORAL | 0 refills | Status: DC

## 2019-03-31 NOTE — Telephone Encounter (Signed)
Please advise per Dr. Judeen Hammans absence  Control database checked last refill: 03/01/2019 120 tabs LOV: 11/17/2018 NOV:04/18/2019

## 2019-03-31 NOTE — Telephone Encounter (Signed)
Done erx 

## 2019-04-11 DIAGNOSIS — M5136 Other intervertebral disc degeneration, lumbar region: Secondary | ICD-10-CM | POA: Diagnosis not present

## 2019-04-11 DIAGNOSIS — M47816 Spondylosis without myelopathy or radiculopathy, lumbar region: Secondary | ICD-10-CM | POA: Diagnosis not present

## 2019-04-11 DIAGNOSIS — I739 Peripheral vascular disease, unspecified: Secondary | ICD-10-CM | POA: Diagnosis not present

## 2019-04-11 DIAGNOSIS — M069 Rheumatoid arthritis, unspecified: Secondary | ICD-10-CM | POA: Diagnosis not present

## 2019-04-11 DIAGNOSIS — G629 Polyneuropathy, unspecified: Secondary | ICD-10-CM | POA: Diagnosis not present

## 2019-04-11 DIAGNOSIS — M545 Low back pain: Secondary | ICD-10-CM | POA: Diagnosis not present

## 2019-04-11 DIAGNOSIS — M419 Scoliosis, unspecified: Secondary | ICD-10-CM | POA: Diagnosis not present

## 2019-04-18 ENCOUNTER — Encounter: Payer: Self-pay | Admitting: Internal Medicine

## 2019-04-18 ENCOUNTER — Ambulatory Visit (INDEPENDENT_AMBULATORY_CARE_PROVIDER_SITE_OTHER): Payer: Self-pay | Admitting: Internal Medicine

## 2019-04-18 ENCOUNTER — Other Ambulatory Visit: Payer: Self-pay

## 2019-04-18 ENCOUNTER — Other Ambulatory Visit (INDEPENDENT_AMBULATORY_CARE_PROVIDER_SITE_OTHER): Payer: Managed Care, Other (non HMO)

## 2019-04-18 VITALS — BP 128/68 | HR 77 | Temp 98.2°F | Ht 62.0 in | Wt 133.0 lb

## 2019-04-18 DIAGNOSIS — E785 Hyperlipidemia, unspecified: Secondary | ICD-10-CM

## 2019-04-18 DIAGNOSIS — M0600 Rheumatoid arthritis without rheumatoid factor, unspecified site: Secondary | ICD-10-CM

## 2019-04-18 DIAGNOSIS — I2510 Atherosclerotic heart disease of native coronary artery without angina pectoris: Secondary | ICD-10-CM

## 2019-04-18 DIAGNOSIS — Z23 Encounter for immunization: Secondary | ICD-10-CM | POA: Diagnosis not present

## 2019-04-18 DIAGNOSIS — M810 Age-related osteoporosis without current pathological fracture: Secondary | ICD-10-CM

## 2019-04-18 DIAGNOSIS — J449 Chronic obstructive pulmonary disease, unspecified: Secondary | ICD-10-CM

## 2019-04-18 LAB — CBC WITH DIFFERENTIAL/PLATELET
Basophils Absolute: 0 10*3/uL (ref 0.0–0.1)
Basophils Relative: 0.6 % (ref 0.0–3.0)
Eosinophils Absolute: 0.1 10*3/uL (ref 0.0–0.7)
Eosinophils Relative: 1 % (ref 0.0–5.0)
HCT: 42.2 % (ref 36.0–46.0)
Hemoglobin: 14 g/dL (ref 12.0–15.0)
Lymphocytes Relative: 21.3 % (ref 12.0–46.0)
Lymphs Abs: 1.7 10*3/uL (ref 0.7–4.0)
MCHC: 33.2 g/dL (ref 30.0–36.0)
MCV: 89.2 fl (ref 78.0–100.0)
Monocytes Absolute: 0.7 10*3/uL (ref 0.1–1.0)
Monocytes Relative: 8.9 % (ref 3.0–12.0)
Neutro Abs: 5.6 10*3/uL (ref 1.4–7.7)
Neutrophils Relative %: 68.2 % (ref 43.0–77.0)
Platelets: 310 10*3/uL (ref 150.0–400.0)
RBC: 4.73 Mil/uL (ref 3.87–5.11)
RDW: 13.6 % (ref 11.5–15.5)
WBC: 8.2 10*3/uL (ref 4.0–10.5)

## 2019-04-18 LAB — HEPATIC FUNCTION PANEL
ALT: 19 U/L (ref 0–35)
AST: 22 U/L (ref 0–37)
Albumin: 4.2 g/dL (ref 3.5–5.2)
Alkaline Phosphatase: 104 U/L (ref 39–117)
Bilirubin, Direct: 0 mg/dL (ref 0.0–0.3)
Total Bilirubin: 0.3 mg/dL (ref 0.2–1.2)
Total Protein: 7.6 g/dL (ref 6.0–8.3)

## 2019-04-18 LAB — BASIC METABOLIC PANEL
BUN: 14 mg/dL (ref 6–23)
CO2: 33 mEq/L — ABNORMAL HIGH (ref 19–32)
Calcium: 9.7 mg/dL (ref 8.4–10.5)
Chloride: 99 mEq/L (ref 96–112)
Creatinine, Ser: 0.66 mg/dL (ref 0.40–1.20)
GFR: 88.04 mL/min (ref >60.00)
Glucose, Bld: 94 mg/dL (ref 70–99)
Potassium: 4.4 mEq/L (ref 3.5–5.1)
Sodium: 137 mEq/L (ref 135–145)

## 2019-04-18 LAB — URINALYSIS
Hgb urine dipstick: NEGATIVE
Leukocytes,Ua: NEGATIVE
Nitrite: NEGATIVE
Specific Gravity, Urine: >=1.03 — AB (ref 1.000–1.030)
Urine Glucose: NEGATIVE
Urobilinogen, UA: 0.2 (ref 0.0–1.0)
pH: 6 (ref 5.0–8.0)

## 2019-04-18 LAB — TSH: TSH: 2.38 u[IU]/mL (ref 0.35–4.50)

## 2019-04-18 LAB — LIPID PANEL
Cholesterol: 295 mg/dL — ABNORMAL HIGH (ref 0–200)
HDL: 56.7 mg/dL (ref >39.00)
LDL Cholesterol: 198 mg/dL — ABNORMAL HIGH (ref 0–99)
NonHDL: 237.89
Total CHOL/HDL Ratio: 5
Triglycerides: 199 mg/dL — ABNORMAL HIGH (ref 0.0–149.0)
VLDL: 39.8 mg/dL (ref 0.0–40.0)

## 2019-04-18 MED ORDER — TRAMADOL HCL 50 MG PO TABS: ORAL_TABLET | ORAL | 1 refills | Status: DC

## 2019-04-18 MED ORDER — ZOLPIDEM TARTRATE 10 MG PO TABS: 10.0000 mg | ORAL_TABLET | Freq: Every evening | ORAL | 1 refills | Status: DC | PRN

## 2019-04-18 NOTE — Assessment & Plan Note (Signed)
Vit D Pt declined Prolia, other Rx

## 2019-04-18 NOTE — Patient Instructions (Signed)
If you have medicare related insurance (such as traditional Medicare, Blue Cross Medicare, United HealthCare Medicare, or similar), Please make an appointment at the scheduling desk with Jill, the Wellness Health Coach, for your Wellness visit in this office, which is a benefit with your insurance.  

## 2019-04-18 NOTE — Progress Notes (Signed)
Subjective:  Patient ID: Debra Farmer, female    DOB: 11-Jul-1945  Age: 73 y.o. MRN: [id number]  CC: No chief complaint on file.   HPI Senya Murin Summa presents for RA, LBP, insomnia f/u  Outpatient Medications Prior to Visit  Medication Sig Dispense Refill   aspirin EC 81 MG EC tablet Take 1 tablet (81 mg total) by mouth 2 (two) times daily. (Patient taking differently: Take 81 mg by mouth daily. ) 60 tablet 1   Cholecalciferol (VITAMIN D3) 5000 units TABS Take 5,000 Units by mouth at bedtime. WITH A GLASS OF VANILLA-HONEY ALMOND MILK     Cyanocobalamin (VITAMIN B-12) 1000 MCG/15ML LIQD Take 15 mLs by mouth daily.     GARLIC PO Take 1 tablet by mouth daily.     HUMIRA PEN 40 MG/0.4ML PNKT      ipratropium-albuterol (DUONEB) 0.5-2.5 (3) MG/3ML SOLN Take 3 mLs by nebulization every 4 (four) hours as needed. (Patient taking differently: Take 3 mLs by nebulization every 4 (four) hours as needed (for shortness of breath/wheezing). ) 360 mL 3   leflunomide (ARAVA) 20 MG tablet Take 20 mg by mouth daily.     Melatonin 3 MG TABS Take 3 mg by mouth at bedtime as needed (for sleep).     pravastatin (PRAVACHOL) 20 MG tablet Take 1 tablet (20 mg total) by mouth daily. 30 tablet 11   predniSONE (DELTASONE) 1 MG tablet Take 4 tablets (4 mg total) by mouth daily with breakfast. 120 tablet 1   Probiotic CAPS Take 1 capsule by mouth daily.     Pyridoxine HCl (VITAMIN B-6 PO) Take 1 tablet by mouth daily.     senna (SENOKOT) 8.6 MG TABS tablet Take 2 tablets (17.2 mg total) by mouth at bedtime. 120 each 0   traMADol (ULTRAM) 50 MG tablet TAKE 1-2 TABLETS BY MOUTH TWICE DAILY ASNEEDED FOR PAIN 120 tablet 1   zolpidem (AMBIEN) 10 MG tablet Take 1 tablet (10 mg total) by mouth at bedtime as needed. for sleep 90 tablet 1   Adalimumab (HUMIRA [place]) Inject into the skin.     traMADol (ULTRAM) 50 MG tablet TAKE 1 TO 2 TABLETS BY MOUTH TWICE DAILYAS NEEDED FOR PAIN 120 tablet 0   No  facility-administered medications prior to visit.     ROS: Review of Systems  Constitutional: Positive for fatigue. Negative for activity change, appetite change, chills and unexpected weight change.  HENT: Negative for congestion, mouth sores and sinus pressure.   Eyes: Negative for visual disturbance.  Respiratory: Negative for cough and chest tightness.   Gastrointestinal: Negative for abdominal pain and nausea.  Genitourinary: Negative for difficulty urinating, frequency and vaginal pain.  Musculoskeletal: Positive for arthralgias, back pain, gait problem and neck pain.  Skin: Negative for pallor and rash.  Neurological: Negative for dizziness, tremors, weakness, numbness and headaches.  Hematological: Bruises/bleeds easily.  Psychiatric/Behavioral: Positive for sleep disturbance. Negative for confusion and suicidal ideas.    Objective:  BP 128/68 (BP Location: Left Arm, Patient Position: Sitting, Cuff Size: Normal)    Pulse 77    Temp 98.2 F (36.8 C) (Oral)    Ht 5\' 2"  (1.575 m)    Wt 133 lb (60.3 kg)    SpO2 94%    BMI 24.33 kg/m   BP Readings from Last 3 Encounters:  04/18/19 128/68  11/17/18 118/64  03/17/18 128/64    Wt Readings from Last 3 Encounters:  04/18/19 133 lb (60.3 kg)  11/17/18 133  lb (60.3 kg)  03/17/18 127 lb (57.6 kg)    Physical Exam Constitutional:      General: She is not in acute distress.    Appearance: She is well-developed.  HENT:     Head: Normocephalic.     Right Ear: External ear normal.     Left Ear: External ear normal.     Nose: Nose normal.  Eyes:     General:        Right eye: No discharge.        Left eye: No discharge.     Conjunctiva/sclera: Conjunctivae normal.     Pupils: Pupils are equal, round, and reactive to light.  Neck:     Musculoskeletal: Normal range of motion and neck supple.     Thyroid: No thyromegaly.     Vascular: No JVD.     Trachea: No tracheal deviation.  Cardiovascular:     Rate and Rhythm: Normal  rate and regular rhythm.     Heart sounds: Normal heart sounds.  Pulmonary:     Effort: No respiratory distress.     Breath sounds: No stridor. No wheezing.  Abdominal:     General: Bowel sounds are normal. There is no distension.     Palpations: Abdomen is soft. There is no mass.     Tenderness: There is no abdominal tenderness. There is no guarding or rebound.  Musculoskeletal:        General: Tenderness present.  Lymphadenopathy:     Cervical: No cervical adenopathy.  Skin:    Findings: Bruising present. No erythema or rash.  Neurological:     Mental Status: She is oriented to person, place, and time.     Cranial Nerves: No cranial nerve deficit.     Motor: No abnormal muscle tone.     Coordination: Coordination normal.     Deep Tendon Reflexes: Reflexes normal.  Psychiatric:        Behavior: Behavior normal.        Thought Content: Thought content normal.        Judgment: Judgment normal.    LS, hips tender   Lab Results  Component Value Date   WBC 9.2 03/23/2018   HGB 13.4 03/23/2018   HCT 39 03/23/2018   PLT 328 03/23/2018   GLUCOSE 98 09/21/2017   CHOL 254 (A) 03/23/2018   TRIG 124 03/23/2018   HDL 57 03/23/2018   LDLDIRECT 170.0 12/05/2014   LDLCALC 172 03/23/2018   ALT 20 03/23/2018   AST 25 03/23/2018   NA 136 (A) 03/23/2018   K 4.6 03/23/2018   CL 98 (L) 09/21/2017   CREATININE 0.6 03/23/2018   BUN 16 03/23/2018   CO2 26 09/21/2017   TSH 1.63 12/05/2014    Dg Chest 2 View  Result Date: 09/21/2017 CLINICAL DATA:  73 year old female with anterior chest pain onset this morning. Smoker. EXAM: CHEST - 2 VIEW COMPARISON:  Chest radiographs 09/17/2015 and earlier. FINDINGS: Mildly lower lung volumes today. Chronic increased bilateral pulmonary interstitial markings appear stable. Mediastinal contours appear stable and within normal limits. Visualized tracheal air column is within normal limits. No pneumothorax, pleural effusion, confluent or acute pulmonary  opacity. No acute osseous abnormality identified. Stable cholecystectomy clips. Negative visible bowel gas pattern. IMPRESSION: Chronic interstitial lung changes appear stable since 2017. No acute cardiopulmonary abnormality. Electronically Signed   By: Genevie Ann M.D.   On: 09/21/2017 14:32   Ct Angio Chest Pe W And/or Wo Contrast  Result Date: 09/21/2017  CLINICAL DATA:  73 year old female with acute chest pain today, inspiratory pain. EXAM: CT ANGIOGRAPHY CHEST WITH CONTRAST TECHNIQUE: Multidetector CT imaging of the chest was performed using the standard protocol during bolus administration of intravenous contrast. Multiplanar CT image reconstructions and MIPs were obtained to evaluate the vascular anatomy. CONTRAST:  166mL ISOVUE-370 IOPAMIDOL (ISOVUE-370) INJECTION 76% COMPARISON:  CT chest abdomen and pelvis 03/23/2006. FINDINGS: Cardiovascular: Excellent contrast bolus timing in the pulmonary arterial tree. No focal filling defect identified in the pulmonary arteries to suggest acute pulmonary embolism. Calcified coronary artery atherosclerosis and Calcified aortic atherosclerosis appear increased since 2007. Negative visible aorta otherwise. Cardiac size is within normal limits. Suggestion of left ventricular hypertrophy. No pericardial effusion. Mediastinum/Nodes: No lymphadenopathy.  Negative thoracic inlet. Lungs/Pleura: The major airways are patent. Lung volumes are lower compared to 2007 and there is bilateral pulmonary mosaic attenuation which might reflect areas of gas trapping superimposed on mild atelectasis. There is mild generalized peribronchial thickening. No pleural effusion and no other abnormal pulmonary opacity. Upper Abdomen: Negative visible noncontrast liver, spleen, pancreas, left adrenal gland, left kidney, and bowel in the upper abdomen. Musculoskeletal: Negative. Review of the MIP images confirms the above findings. IMPRESSION: 1.  Negative for acute pulmonary embolus. 2. Suspected  mild atelectasis and gas trapping in both lungs. Generalized peribronchial thickening which might be infectious or inflammatory. 3. Calcified coronary artery and aortic atherosclerosis. Suggestion of left ventricular hypertrophy. Electronically Signed   By: Genevie Ann M.D.   On: 09/21/2017 18:59    Assessment & Plan:   There are no diagnoses linked to this encounter.   No orders of the defined types were placed in this encounter.    Follow-up: No follow-ups on file.  Walker Kehr, MD

## 2019-04-18 NOTE — Assessment & Plan Note (Addendum)
On Humira now 2020 Arava d/c  Prednisone 4 mg/d Tramadol prn  Vit D Pt declined Prolia, other Rx

## 2019-04-18 NOTE — Assessment & Plan Note (Signed)
ASA 81 mg/d. Declined statins

## 2019-04-18 NOTE — Assessment & Plan Note (Signed)
Off inhalers Doing well

## 2019-04-19 NOTE — Addendum Note (Signed)
Addended by: Karren Cobble on: 04/19/2019 09:11 AM   Modules accepted: Orders

## 2019-06-22 ENCOUNTER — Other Ambulatory Visit: Payer: Self-pay | Admitting: Internal Medicine

## 2019-06-22 NOTE — Telephone Encounter (Signed)
Check [place] registry last filled 05/25/2019../lmb ° °

## 2019-06-23 NOTE — Telephone Encounter (Signed)
Patient got on the phone after talking with husband. Was calling to check status of refill. Advised of refill policy

## 2019-07-13 ENCOUNTER — Telehealth: Payer: Self-pay

## 2019-07-13 NOTE — Telephone Encounter (Signed)
New message    Need a referral to Gastroenterology.

## 2019-07-13 NOTE — Telephone Encounter (Signed)
Why does pt need referral?

## 2019-07-25 DIAGNOSIS — Z96641 Presence of right artificial hip joint: Secondary | ICD-10-CM | POA: Diagnosis not present

## 2019-07-25 DIAGNOSIS — M25551 Pain in right hip: Secondary | ICD-10-CM | POA: Diagnosis not present

## 2019-07-25 NOTE — Telephone Encounter (Signed)
Addendum   The reason why --Patient having stomach pain & heartburn the patient had taken over-the-counter medication Philip's milk of magnesium situation has helped.   The patient said to disregard the referral to gastroenterology.

## 2019-07-26 NOTE — Telephone Encounter (Signed)
Noted  

## 2019-08-02 DIAGNOSIS — M25551 Pain in right hip: Secondary | ICD-10-CM | POA: Diagnosis not present

## 2019-08-02 DIAGNOSIS — Z96641 Presence of right artificial hip joint: Secondary | ICD-10-CM | POA: Diagnosis not present

## 2019-08-08 DIAGNOSIS — M25551 Pain in right hip: Secondary | ICD-10-CM | POA: Diagnosis not present

## 2019-08-10 DIAGNOSIS — M0609 Rheumatoid arthritis without rheumatoid factor, multiple sites: Secondary | ICD-10-CM | POA: Diagnosis not present

## 2019-08-15 DIAGNOSIS — Z79899 Other long term (current) drug therapy: Secondary | ICD-10-CM | POA: Diagnosis not present

## 2019-08-15 DIAGNOSIS — E663 Overweight: Secondary | ICD-10-CM | POA: Diagnosis not present

## 2019-08-15 DIAGNOSIS — Z6826 Body mass index (BMI) 26.0-26.9, adult: Secondary | ICD-10-CM | POA: Diagnosis not present

## 2019-08-15 DIAGNOSIS — M150 Primary generalized (osteo)arthritis: Secondary | ICD-10-CM | POA: Diagnosis not present

## 2019-08-15 DIAGNOSIS — M0609 Rheumatoid arthritis without rheumatoid factor, multiple sites: Secondary | ICD-10-CM | POA: Diagnosis not present

## 2019-08-15 DIAGNOSIS — M2550 Pain in unspecified joint: Secondary | ICD-10-CM | POA: Diagnosis not present

## 2019-08-23 DIAGNOSIS — M25551 Pain in right hip: Secondary | ICD-10-CM | POA: Diagnosis not present

## 2019-09-18 ENCOUNTER — Other Ambulatory Visit: Payer: Self-pay

## 2019-09-18 ENCOUNTER — Ambulatory Visit (INDEPENDENT_AMBULATORY_CARE_PROVIDER_SITE_OTHER): Payer: 59 | Admitting: Internal Medicine

## 2019-09-18 ENCOUNTER — Other Ambulatory Visit: Payer: Self-pay | Admitting: Internal Medicine

## 2019-09-18 ENCOUNTER — Encounter: Payer: Self-pay | Admitting: Internal Medicine

## 2019-09-18 DIAGNOSIS — J449 Chronic obstructive pulmonary disease, unspecified: Secondary | ICD-10-CM

## 2019-09-18 DIAGNOSIS — M48061 Spinal stenosis, lumbar region without neurogenic claudication: Secondary | ICD-10-CM | POA: Diagnosis not present

## 2019-09-18 DIAGNOSIS — M0600 Rheumatoid arthritis without rheumatoid factor, unspecified site: Secondary | ICD-10-CM

## 2019-09-18 DIAGNOSIS — I2510 Atherosclerotic heart disease of native coronary artery without angina pectoris: Secondary | ICD-10-CM

## 2019-09-18 MED ORDER — TRAMADOL HCL 50 MG PO TABS: ORAL_TABLET | ORAL | 2 refills | Status: DC

## 2019-09-18 NOTE — Assessment & Plan Note (Signed)
Not smoking No CP

## 2019-09-18 NOTE — Assessment & Plan Note (Signed)
Tramadol prn ° Potential benefits of a long term opioids use as well as potential risks (i.e. addiction risk, apnea etc) and complications (i.e. Somnolence, constipation and others) were explained to the patient and were aknowledged. ° ° °

## 2019-09-18 NOTE — Assessment & Plan Note (Signed)
Off inhalers 

## 2019-09-18 NOTE — Progress Notes (Signed)
Subjective:  Patient ID: Debra Farmer, female    DOB: 1946-02-13  Age: 74 y.o. MRN: [id number]  CC: No chief complaint on file.   HPI Debra Farmer presents for insomnia, joint pains - RA on Humira, prednisone 4 mg/d.   Outpatient Medications Prior to Visit  Medication Sig Dispense Refill  . aspirin EC 81 MG EC tablet Take 1 tablet (81 mg total) by mouth 2 (two) times daily. (Patient taking differently: Take 81 mg by mouth daily. ) 60 tablet 1  . Cholecalciferol (VITAMIN D3) 5000 units TABS Take 5,000 Units by mouth at bedtime. WITH A GLASS OF VANILLA-HONEY ALMOND MILK    . Cyanocobalamin (VITAMIN B-12) 1000 MCG/15ML LIQD Take 15 mLs by mouth daily.    [name] GARLIC PO Take 1 tablet by mouth daily.    [name] HUMIRA PEN 40 MG/0.4ML PNKT     . ipratropium-albuterol (DUONEB) 0.5-2.5 (3) MG/3ML SOLN Take 3 mLs by nebulization every 4 (four) hours as needed. (Patient taking differently: Take 3 mLs by nebulization every 4 (four) hours as needed (for shortness of breath/wheezing). ) 360 mL 3  . leflunomide (ARAVA) 20 MG tablet Take 20 mg by mouth daily.    . Melatonin 3 MG TABS Take 3 mg by mouth at bedtime as needed (for sleep).    . predniSONE (DELTASONE) 1 MG tablet Take 4 tablets (4 mg total) by mouth daily with breakfast. 120 tablet 1  . Probiotic CAPS Take 1 capsule by mouth daily.    . Pyridoxine HCl (VITAMIN B-6 PO) Take 1 tablet by mouth daily.    . traMADol (ULTRAM) 50 MG tablet TAKE 1-2 TABLETS BY MOUTH TWICE DAILY ASNEEDED FOR PAIN 120 tablet 2  . zolpidem (AMBIEN) 10 MG tablet Take 1 tablet (10 mg total) by mouth at bedtime as needed. for sleep 90 tablet 1  . hydroxychloroquine (PLAQUENIL) 200 MG tablet Take 200 mg by mouth 2 (two) times daily.    . pravastatin (PRAVACHOL) 20 MG tablet Take 1 tablet (20 mg total) by mouth daily. (Patient not taking: Reported on 09/18/2019) 30 tablet 11  . senna (SENOKOT) 8.6 MG TABS tablet Take 2 tablets (17.2 mg total) by mouth at bedtime. (Patient  not taking: Reported on 09/18/2019) 120 each 0   No facility-administered medications prior to visit.    ROS: Review of Systems  Constitutional: Positive for fatigue. Negative for activity change, appetite change, chills and unexpected weight change.  HENT: Negative for congestion, mouth sores and sinus pressure.   Eyes: Negative for visual disturbance.  Respiratory: Negative for cough and chest tightness.   Gastrointestinal: Negative for abdominal pain and nausea.  Genitourinary: Negative for difficulty urinating, frequency and vaginal pain.  Musculoskeletal: Positive for arthralgias and gait problem. Negative for back pain.  Skin: Negative for pallor and rash.  Neurological: Negative for dizziness, tremors, weakness, numbness and headaches.  Psychiatric/Behavioral: Positive for decreased concentration. Negative for confusion, sleep disturbance and suicidal ideas. The patient is nervous/anxious.     Objective:  BP 138/64 (BP Location: Right Arm, Patient Position: Sitting, Cuff Size: Normal)   Pulse 82   Temp 98.2 F (36.8 C) (Oral)   Ht 5\' 2"  (1.575 m)   Wt 134 lb 8 oz (61 kg)   SpO2 96%   BMI 24.60 kg/m   BP Readings from Last 3 Encounters:  09/18/19 138/64  04/18/19 128/68  11/17/18 118/64    Wt Readings from Last 3 Encounters:  09/18/19 134 lb 8 oz (61  kg)  04/18/19 133 lb (60.3 kg)  11/17/18 133 lb (60.3 kg)    Physical Exam Constitutional:      General: She is not in acute distress.    Appearance: She is well-developed.  HENT:     Head: Normocephalic.     Right Ear: External ear normal.     Left Ear: External ear normal.     Nose: Nose normal.  Eyes:     General:        Right eye: No discharge.        Left eye: No discharge.     Conjunctiva/sclera: Conjunctivae normal.     Pupils: Pupils are equal, round, and reactive to light.  Neck:     Thyroid: No thyromegaly.     Vascular: No JVD.     Trachea: No tracheal deviation.  Cardiovascular:     Rate and  Rhythm: Normal rate and regular rhythm.     Heart sounds: Normal heart sounds.  Pulmonary:     Effort: No respiratory distress.     Breath sounds: No stridor. No wheezing.  Abdominal:     General: Bowel sounds are normal. There is no distension.     Palpations: Abdomen is soft. There is no mass.     Tenderness: There is no abdominal tenderness. There is no guarding or rebound.  Musculoskeletal:        General: Tenderness present.     Cervical back: Normal range of motion and neck supple.  Lymphadenopathy:     Cervical: No cervical adenopathy.  Skin:    Findings: No erythema or rash.  Neurological:     Mental Status: She is oriented to person, place, and time.     Cranial Nerves: No cranial nerve deficit.     Motor: No abnormal muscle tone.     Coordination: Coordination normal.     Deep Tendon Reflexes: Reflexes normal.  Psychiatric:        Behavior: Behavior normal.        Thought Content: Thought content normal.        Judgment: Judgment normal.    LS, hips - tender   Lab Results  Component Value Date   WBC 8.2 04/18/2019   HGB 14.0 04/18/2019   HCT 42.2 04/18/2019   PLT 310.0 04/18/2019   GLUCOSE 94 04/18/2019   CHOL 295 (H) 04/18/2019   TRIG 199.0 (H) 04/18/2019   HDL 56.70 04/18/2019   LDLDIRECT 170.0 12/05/2014   LDLCALC 198 (H) 04/18/2019   ALT 19 04/18/2019   AST 22 04/18/2019   NA 137 04/18/2019   K 4.4 04/18/2019   CL 99 04/18/2019   CREATININE 0.66 04/18/2019   BUN 14 04/18/2019   CO2 33 (H) 04/18/2019   TSH 2.38 04/18/2019    DG Chest 2 View  Result Date: 09/21/2017 CLINICAL DATA:  74 year old female with anterior chest pain onset this morning. Smoker. EXAM: CHEST - 2 VIEW COMPARISON:  Chest radiographs 09/17/2015 and earlier. FINDINGS: Mildly lower lung volumes today. Chronic increased bilateral pulmonary interstitial markings appear stable. Mediastinal contours appear stable and within normal limits. Visualized tracheal air column is within normal  limits. No pneumothorax, pleural effusion, confluent or acute pulmonary opacity. No acute osseous abnormality identified. Stable cholecystectomy clips. Negative visible bowel gas pattern. IMPRESSION: Chronic interstitial lung changes appear stable since 2017. No acute cardiopulmonary abnormality. Electronically Signed   By: Genevie Ann M.D.   On: 09/21/2017 14:32   CT Angio Chest PE W and/or Wo  Contrast  Result Date: 09/21/2017 CLINICAL DATA:  74 year old female with acute chest pain today, inspiratory pain. EXAM: CT ANGIOGRAPHY CHEST WITH CONTRAST TECHNIQUE: Multidetector CT imaging of the chest was performed using the standard protocol during bolus administration of intravenous contrast. Multiplanar CT image reconstructions and MIPs were obtained to evaluate the vascular anatomy. CONTRAST:  142mL ISOVUE-370 IOPAMIDOL (ISOVUE-370) INJECTION 76% COMPARISON:  CT chest abdomen and pelvis 03/23/2006. FINDINGS: Cardiovascular: Excellent contrast bolus timing in the pulmonary arterial tree. No focal filling defect identified in the pulmonary arteries to suggest acute pulmonary embolism. Calcified coronary artery atherosclerosis and Calcified aortic atherosclerosis appear increased since 2007. Negative visible aorta otherwise. Cardiac size is within normal limits. Suggestion of left ventricular hypertrophy. No pericardial effusion. Mediastinum/Nodes: No lymphadenopathy.  Negative thoracic inlet. Lungs/Pleura: The major airways are patent. Lung volumes are lower compared to 2007 and there is bilateral pulmonary mosaic attenuation which might reflect areas of gas trapping superimposed on mild atelectasis. There is mild generalized peribronchial thickening. No pleural effusion and no other abnormal pulmonary opacity. Upper Abdomen: Negative visible noncontrast liver, spleen, pancreas, left adrenal gland, left kidney, and bowel in the upper abdomen. Musculoskeletal: Negative. Review of the MIP images confirms the above  findings. IMPRESSION: 1.  Negative for acute pulmonary embolus. 2. Suspected mild atelectasis and gas trapping in both lungs. Generalized peribronchial thickening which might be infectious or inflammatory. 3. Calcified coronary artery and aortic atherosclerosis. Suggestion of left ventricular hypertrophy. Electronically Signed   By: Genevie Ann M.D.   On: 09/21/2017 18:59    Assessment & Plan:   There are no diagnoses linked to this encounter.   No orders of the defined types were placed in this encounter.    Follow-up: No follow-ups on file.  Walker Kehr, MD

## 2019-09-18 NOTE — Assessment & Plan Note (Signed)
On Humira   Prednisone 4 mg/d

## 2019-10-02 ENCOUNTER — Ambulatory Visit: Payer: Medicare Other | Attending: Internal Medicine

## 2019-10-02 DIAGNOSIS — Z20822 Contact with and (suspected) exposure to covid-19: Secondary | ICD-10-CM

## 2019-10-03 LAB — NOVEL CORONAVIRUS, NAA: SARS-CoV-2, NAA: NOT DETECTED

## 2019-10-03 LAB — SARS-COV-2, NAA 2 DAY TAT

## 2019-10-25 ENCOUNTER — Ambulatory Visit: Payer: 59 | Admitting: Internal Medicine

## 2019-10-26 ENCOUNTER — Encounter: Payer: Self-pay | Admitting: Internal Medicine

## 2019-10-26 ENCOUNTER — Other Ambulatory Visit: Payer: Self-pay

## 2019-10-26 ENCOUNTER — Ambulatory Visit (INDEPENDENT_AMBULATORY_CARE_PROVIDER_SITE_OTHER): Payer: 59 | Admitting: Internal Medicine

## 2019-10-26 VITALS — BP 144/72 | HR 73 | Temp 98.3°F | Ht 62.0 in | Wt 138.0 lb

## 2019-10-26 DIAGNOSIS — I2510 Atherosclerotic heart disease of native coronary artery without angina pectoris: Secondary | ICD-10-CM | POA: Diagnosis not present

## 2019-10-26 DIAGNOSIS — E785 Hyperlipidemia, unspecified: Secondary | ICD-10-CM | POA: Diagnosis not present

## 2019-10-26 DIAGNOSIS — G4709 Other insomnia: Secondary | ICD-10-CM

## 2019-10-26 DIAGNOSIS — M0600 Rheumatoid arthritis without rheumatoid factor, unspecified site: Secondary | ICD-10-CM

## 2019-10-26 LAB — BASIC METABOLIC PANEL
BUN: 16 mg/dL (ref 6–23)
CO2: 34 mEq/L — ABNORMAL HIGH (ref 19–32)
Calcium: 9.6 mg/dL (ref 8.4–10.5)
Chloride: 98 mEq/L (ref 96–112)
Creatinine, Ser: 0.77 mg/dL (ref 0.40–1.20)
GFR: 73.59 mL/min (ref >60.00)
Glucose, Bld: 102 mg/dL — ABNORMAL HIGH (ref 70–99)
Potassium: 3.8 mEq/L (ref 3.5–5.1)
Sodium: 136 mEq/L (ref 135–145)

## 2019-10-26 LAB — LIPID PANEL
Cholesterol: 266 mg/dL — ABNORMAL HIGH (ref 0–200)
HDL: 58 mg/dL (ref >39.00)
LDL Cholesterol: 174 mg/dL — ABNORMAL HIGH (ref 0–99)
NonHDL: 207.75
Total CHOL/HDL Ratio: 5
Triglycerides: 168 mg/dL — ABNORMAL HIGH (ref 0.0–149.0)
VLDL: 33.6 mg/dL (ref 0.0–40.0)

## 2019-10-26 LAB — TSH: TSH: 1.25 u[IU]/mL (ref 0.35–4.50)

## 2019-10-26 MED ORDER — ZOLPIDEM TARTRATE 10 MG PO TABS: 10.0000 mg | ORAL_TABLET | Freq: Every evening | ORAL | 1 refills | Status: DC | PRN

## 2019-10-26 MED ORDER — TRAMADOL HCL 50 MG PO TABS: ORAL_TABLET | ORAL | 2 refills | Status: DC

## 2019-10-26 NOTE — Addendum Note (Signed)
Addended by: Cresenciano Lick on: 10/26/2019 04:14 PM   Modules accepted: Orders

## 2019-10-26 NOTE — Assessment & Plan Note (Addendum)
No CP. Not taking ASA On Pravachol - d/c'd Cardiol ref

## 2019-10-26 NOTE — Assessment & Plan Note (Addendum)
F/u w/Dr Trudie Reed Plaquenil was d/c'd  On Humira now 2020 Tramadol prn  Potential benefits of a long term opioids use as well as potential risks (i.e. addiction risk, apnea etc) and complications (i.e. Somnolence, constipation and others) were explained to the patient and were aknowledged.

## 2019-10-26 NOTE — Assessment & Plan Note (Signed)
Zolpidem prn  Potential benefits of a long term benzodiazepines  use as well as potential risks  and complications were explained to the patient and were aknowledged. 

## 2019-10-26 NOTE — Progress Notes (Signed)
Subjective:  Patient ID: Debra Farmer, female    DOB: [birth year]  Age: 74 y.o. MRN: [id number]  CC: No chief complaint on file.   HPI Debra Farmer presents for chronic pain, RA, B12 def f/u, insomnia  Outpatient Medications Prior to Visit  Medication Sig Dispense Refill  . aspirin EC 81 MG EC tablet Take 1 tablet (81 mg total) by mouth 2 (two) times daily. (Patient taking differently: Take 81 mg by mouth daily. ) 60 tablet 1  . Cholecalciferol (VITAMIN D3) 5000 units TABS Take 5,000 Units by mouth at bedtime. WITH A GLASS OF VANILLA-HONEY ALMOND MILK    . Cyanocobalamin (VITAMIN B-12) 1000 MCG/15ML LIQD Take 15 mLs by mouth daily.    [name] GARLIC PO Take 1 tablet by mouth daily.    [name] HUMIRA PEN 40 MG/0.4ML PNKT     . ipratropium-albuterol (DUONEB) 0.5-2.5 (3) MG/3ML SOLN Take 3 mLs by nebulization every 4 (four) hours as needed. (Patient taking differently: Take 3 mLs by nebulization every 4 (four) hours as needed (for shortness of breath/wheezing). ) 360 mL 3  . leflunomide (ARAVA) 20 MG tablet Take 20 mg by mouth daily.    . Melatonin 3 MG TABS Take 3 mg by mouth at bedtime as needed (for sleep).    . pravastatin (PRAVACHOL) 20 MG tablet Take 1 tablet (20 mg total) by mouth daily. 30 tablet 11  . predniSONE (DELTASONE) 1 MG tablet Take 4 tablets (4 mg total) by mouth daily with breakfast. 120 tablet 1  . Probiotic CAPS Take 1 capsule by mouth daily.    . Pyridoxine HCl (VITAMIN B-6 PO) Take 1 tablet by mouth daily.    [name] senna (SENOKOT) 8.6 MG TABS tablet Take 2 tablets (17.2 mg total) by mouth at bedtime. 120 each 0  . traMADol (ULTRAM) 50 MG tablet 1 po qid prn 120 tablet 2  . zolpidem (AMBIEN) 10 MG tablet Take 1 tablet (10 mg total) by mouth at bedtime as needed. for sleep 90 tablet 1  . hydroxychloroquine (PLAQUENIL) 200 MG tablet Take 200 mg by mouth 2 (two) times daily.     No facility-administered medications prior to visit.    ROS: Review of Systems    Constitutional: Positive for fatigue. Negative for activity change, appetite change, chills and unexpected weight change.  HENT: Negative for congestion, mouth sores and sinus pressure.   Eyes: Negative for visual disturbance.  Respiratory: Negative for cough and chest tightness.   Gastrointestinal: Negative for abdominal pain and nausea.  Genitourinary: Negative for difficulty urinating, frequency and vaginal pain.  Musculoskeletal: Positive for arthralgias and back pain. Negative for gait problem.  Skin: Negative for pallor and rash.  Neurological: Negative for dizziness, tremors, weakness, numbness and headaches.  Psychiatric/Behavioral: Negative for confusion, sleep disturbance and suicidal ideas.    Objective:  BP (!) 144/72 (BP Location: Left Arm, Patient Position: Sitting, Cuff Size: Normal)   Pulse 73   Temp 98.3 F (36.8 C) (Oral)   Ht 5\' 2"  (1.575 m)   Wt 138 lb (62.6 kg)   SpO2 94%   BMI 25.24 kg/m   BP Readings from Last 3 Encounters:  10/26/19 (!) 144/72  09/18/19 138/64  04/18/19 128/68    Wt Readings from Last 3 Encounters:  10/26/19 138 lb (62.6 kg)  09/18/19 134 lb 8 oz (61 kg)  04/18/19 133 lb (60.3 kg)    Physical Exam Constitutional:      General: She is not in acute  distress.    Appearance: She is well-developed.  HENT:     Head: Normocephalic.     Right Ear: External ear normal.     Left Ear: External ear normal.     Nose: Nose normal.  Eyes:     General:        Right eye: No discharge.        Left eye: No discharge.     Conjunctiva/sclera: Conjunctivae normal.     Pupils: Pupils are equal, round, and reactive to light.  Neck:     Thyroid: No thyromegaly.     Vascular: No JVD.     Trachea: No tracheal deviation.  Cardiovascular:     Rate and Rhythm: Normal rate and regular rhythm.     Heart sounds: Normal heart sounds.  Pulmonary:     Effort: No respiratory distress.     Breath sounds: No stridor. No wheezing.  Abdominal:      General: Bowel sounds are normal. There is no distension.     Palpations: Abdomen is soft. There is no mass.     Tenderness: There is no abdominal tenderness. There is no guarding or rebound.  Musculoskeletal:        General: Tenderness present.     Cervical back: Normal range of motion and neck supple.  Lymphadenopathy:     Cervical: No cervical adenopathy.  Skin:    Findings: No erythema or rash.  Neurological:     Cranial Nerves: No cranial nerve deficit.     Motor: No abnormal muscle tone.     Coordination: Coordination normal.     Deep Tendon Reflexes: Reflexes normal.  Psychiatric:        Behavior: Behavior normal.        Thought Content: Thought content normal.        Judgment: Judgment normal.   R big toenail fell off  Lab Results  Component Value Date   WBC 8.2 04/18/2019   HGB 14.0 04/18/2019   HCT 42.2 04/18/2019   PLT 310.0 04/18/2019   GLUCOSE 94 04/18/2019   CHOL 295 (H) 04/18/2019   TRIG 199.0 (H) 04/18/2019   HDL 56.70 04/18/2019   LDLDIRECT 170.0 12/05/2014   LDLCALC 198 (H) 04/18/2019   ALT 19 04/18/2019   AST 22 04/18/2019   NA 137 04/18/2019   K 4.4 04/18/2019   CL 99 04/18/2019   CREATININE 0.66 04/18/2019   BUN 14 04/18/2019   CO2 33 (H) 04/18/2019   TSH 2.38 04/18/2019    DG Chest 2 View  Result Date: 09/21/2017 CLINICAL DATA:  74 year old female with anterior chest pain onset this morning. Smoker. EXAM: CHEST - 2 VIEW COMPARISON:  Chest radiographs 09/17/2015 and earlier. FINDINGS: Mildly lower lung volumes today. Chronic increased bilateral pulmonary interstitial markings appear stable. Mediastinal contours appear stable and within normal limits. Visualized tracheal air column is within normal limits. No pneumothorax, pleural effusion, confluent or acute pulmonary opacity. No acute osseous abnormality identified. Stable cholecystectomy clips. Negative visible bowel gas pattern. IMPRESSION: Chronic interstitial lung changes appear stable since  2017. No acute cardiopulmonary abnormality. Electronically Signed   By: Genevie Ann M.D.   On: 09/21/2017 14:32   CT Angio Chest PE W and/or Wo Contrast  Result Date: 09/21/2017 CLINICAL DATA:  74 year old female with acute chest pain today, inspiratory pain. EXAM: CT ANGIOGRAPHY CHEST WITH CONTRAST TECHNIQUE: Multidetector CT imaging of the chest was performed using the standard protocol during bolus administration of intravenous contrast. Multiplanar CT image  reconstructions and MIPs were obtained to evaluate the vascular anatomy. CONTRAST:  1104mL ISOVUE-370 IOPAMIDOL (ISOVUE-370) INJECTION 76% COMPARISON:  CT chest abdomen and pelvis 03/23/2006. FINDINGS: Cardiovascular: Excellent contrast bolus timing in the pulmonary arterial tree. No focal filling defect identified in the pulmonary arteries to suggest acute pulmonary embolism. Calcified coronary artery atherosclerosis and Calcified aortic atherosclerosis appear increased since 2007. Negative visible aorta otherwise. Cardiac size is within normal limits. Suggestion of left ventricular hypertrophy. No pericardial effusion. Mediastinum/Nodes: No lymphadenopathy.  Negative thoracic inlet. Lungs/Pleura: The major airways are patent. Lung volumes are lower compared to 2007 and there is bilateral pulmonary mosaic attenuation which might reflect areas of gas trapping superimposed on mild atelectasis. There is mild generalized peribronchial thickening. No pleural effusion and no other abnormal pulmonary opacity. Upper Abdomen: Negative visible noncontrast liver, spleen, pancreas, left adrenal gland, left kidney, and bowel in the upper abdomen. Musculoskeletal: Negative. Review of the MIP images confirms the above findings. IMPRESSION: 1.  Negative for acute pulmonary embolus. 2. Suspected mild atelectasis and gas trapping in both lungs. Generalized peribronchial thickening which might be infectious or inflammatory. 3. Calcified coronary artery and aortic  atherosclerosis. Suggestion of left ventricular hypertrophy. Electronically Signed   By: Genevie Ann M.D.   On: 09/21/2017 18:59    Assessment & Plan:    Walker Kehr, MD

## 2019-10-30 ENCOUNTER — Telehealth: Payer: Self-pay | Admitting: Internal Medicine

## 2019-10-30 NOTE — Telephone Encounter (Signed)
I received FMLA renewal forms to be completed. Husband Merry Proud) caring for patient. Forms have been completed and placed in providers box to review and sign.

## 2019-11-02 NOTE — Telephone Encounter (Signed)
Forms have been signed, Faxed to [employer] @[phone], Copy sent to scan &Charged for under husband.   Informed Merry Proud they have been completed & Original mailed for their records.

## 2019-11-21 DIAGNOSIS — G8220 Paraplegia, unspecified: Secondary | ICD-10-CM | POA: Insufficient documentation

## 2020-01-23 ENCOUNTER — Ambulatory Visit (INDEPENDENT_AMBULATORY_CARE_PROVIDER_SITE_OTHER): Payer: 59 | Admitting: Internal Medicine

## 2020-01-23 ENCOUNTER — Encounter: Payer: Self-pay | Admitting: Internal Medicine

## 2020-01-23 ENCOUNTER — Other Ambulatory Visit: Payer: Self-pay

## 2020-01-23 VITALS — BP 142/82 | HR 78 | Temp 98.2°F | Ht 62.0 in | Wt 131.0 lb

## 2020-01-23 DIAGNOSIS — R7301 Impaired fasting glucose: Secondary | ICD-10-CM | POA: Diagnosis not present

## 2020-01-23 DIAGNOSIS — I2510 Atherosclerotic heart disease of native coronary artery without angina pectoris: Secondary | ICD-10-CM | POA: Diagnosis not present

## 2020-01-23 DIAGNOSIS — R0600 Dyspnea, unspecified: Secondary | ICD-10-CM

## 2020-01-23 DIAGNOSIS — E559 Vitamin D deficiency, unspecified: Secondary | ICD-10-CM | POA: Diagnosis not present

## 2020-01-23 DIAGNOSIS — R200 Anesthesia of skin: Secondary | ICD-10-CM | POA: Diagnosis not present

## 2020-01-23 DIAGNOSIS — M0600 Rheumatoid arthritis without rheumatoid factor, unspecified site: Secondary | ICD-10-CM

## 2020-01-23 DIAGNOSIS — R0609 Other forms of dyspnea: Secondary | ICD-10-CM

## 2020-01-23 NOTE — Patient Instructions (Signed)
We will check the labs today and the EKG of the heart is normal.   It is possible that a nerve in the neck, carpal tunnel could be causing the numbness.

## 2020-01-23 NOTE — Progress Notes (Signed)
° °  Subjective:   Patient ID: Debra Farmer, female    DOB: 21-Feb-1946, 74 y.o.   MRN: [id number]  HPI The patient is a 74 YO female coming in for concerns about bilateral hand numbness. Going on for several weeks. Initially was in the fingertips and now spreading up her hand. She previously had carpal tunnel syndrome and had this fixed surgically 2014 but states that was more painful and not numb. The numbness does not really come and go. Some discomfort which does come and go. Denies recent injury or neck pain. Denies shoulder symptoms. 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 Has to be watching her hands while holding things otherwise she is likely to drop them.   Review of Systems  Constitutional: Negative.   HENT: Negative.   Eyes: Negative.   Respiratory: Negative for cough, chest tightness and shortness of breath.   Cardiovascular: Negative for chest pain, palpitations and leg swelling.  Gastrointestinal: Negative for abdominal distention, abdominal pain, constipation, diarrhea, nausea and vomiting.  Musculoskeletal: Negative.   Skin: Negative.   Neurological: Positive for numbness.  Psychiatric/Behavioral: Negative.     Objective:  Physical Exam Constitutional:      Appearance: She is well-developed.  HENT:     Head: Normocephalic and atraumatic.  Cardiovascular:     Rate and Rhythm: Normal rate and regular rhythm.  Pulmonary:     Effort: Pulmonary effort is normal. No respiratory distress.     Breath sounds: Normal breath sounds. No wheezing or rales.  Abdominal:     General: Bowel sounds are normal. There is no distension.     Palpations: Abdomen is soft.     Tenderness: There is no abdominal tenderness. There is no rebound.  Musculoskeletal:     Cervical back: Normal range of motion.  Skin:    General: Skin is warm and dry.  Neurological:     Mental  Status: She is alert and oriented to person, place, and time.     Cranial Nerves: Cranial nerve deficit present.     Coordination: Coordination normal.     Comments: Numbness both hands with some sensation intact but weak compared to normal     Vitals:   01/23/20 1451  BP: (!) 142/82  Pulse: 78  Temp: 98.2 F (36.8 C)  TempSrc: Oral  SpO2: 93%  Weight: 131 lb (59.4 kg)  Height: 5\' 2"  (1.575 m)   EKG: HR 62, axis normal, interval normal, sinus, no st or t wave changes, no significant change from 2019  This visit occurred during the SARS-CoV-2 public health emergency.  Safety protocols were in place, including screening questions prior to the visit, additional usage of staff PPE, and extensive cleaning of exam room while observing appropriate contact time as indicated for disinfecting solutions.   Assessment & Plan:

## 2020-01-24 DIAGNOSIS — R200 Anesthesia of skin: Secondary | ICD-10-CM | POA: Insufficient documentation

## 2020-01-24 NOTE — Assessment & Plan Note (Addendum)
EKG done in the office without changes, checking TSH, vitamin B12/D, HgA1c, BNP today to rule out causes. Could be related to neck arthritis/changes since both hands are numb recently. If no cause should have neck x-ray.

## 2020-01-24 NOTE — Assessment & Plan Note (Signed)
Is currently off humira for about 3 weeks and it was not working well. She is working with rheumatology to get her back on something. Still taking prednisone 4 mg daily and arava.

## 2020-01-29 LAB — COMPREHENSIVE METABOLIC PANEL
AG Ratio: 1.4 (calc) (ref 1.0–2.5)
ALT: 20 U/L (ref 6–29)
AST: 22 U/L (ref 10–35)
Albumin: 4.1 g/dL (ref 3.6–5.1)
Alkaline phosphatase (APISO): 98 U/L (ref 37–153)
BUN: 13 mg/dL (ref 7–25)
CO2: 34 mmol/L — ABNORMAL HIGH (ref 20–32)
Calcium: 10 mg/dL (ref 8.6–10.4)
Chloride: 99 mmol/L (ref 98–110)
Creat: 0.72 mg/dL (ref 0.60–0.93)
Globulin: 3 g/dL (calc) (ref 1.9–3.7)
Glucose, Bld: 91 mg/dL (ref 65–99)
Potassium: 4.5 mmol/L (ref 3.5–5.3)
Sodium: 137 mmol/L (ref 135–146)
Total Bilirubin: 0.3 mg/dL (ref 0.2–1.2)
Total Protein: 7.1 g/dL (ref 6.1–8.1)

## 2020-01-29 LAB — HEMOGLOBIN A1C
Hgb A1c MFr Bld: 5.6 % of total Hgb (ref <5.7)
Mean Plasma Glucose: 114 (calc)
eAG (mmol/L): 6.3 (calc)

## 2020-01-29 LAB — BRAIN NATRIURETIC PEPTIDE

## 2020-01-29 LAB — CBC
HCT: 41.9 % (ref 35.0–45.0)
Hemoglobin: 14.1 g/dL (ref 11.7–15.5)
MCH: 29.9 pg (ref 27.0–33.0)
MCHC: 33.7 g/dL (ref 32.0–36.0)
MCV: 89 fL (ref 80.0–100.0)
MPV: 11 fL (ref 7.5–12.5)
Platelets: 282 10*3/uL (ref 140–400)
RBC: 4.71 10*6/uL (ref 3.80–5.10)
RDW: 12.6 % (ref 11.0–15.0)
WBC: 7.5 10*3/uL (ref 3.8–10.8)

## 2020-01-29 LAB — TSH: TSH: 1.83 mIU/L (ref 0.40–4.50)

## 2020-01-29 LAB — VITAMIN B12: Vitamin B-12: >2000 pg/mL — ABNORMAL HIGH (ref 200–1100)

## 2020-01-29 LAB — VITAMIN D 25 HYDROXY (VIT D DEFICIENCY, FRACTURES): Vit D, 25-Hydroxy: 42 ng/mL (ref 30–100)

## 2020-02-05 ENCOUNTER — Ambulatory Visit (HOSPITAL_BASED_OUTPATIENT_CLINIC_OR_DEPARTMENT_OTHER): Admit: 2020-02-05 | Payer: 59 | Admitting: Obstetrics and Gynecology

## 2020-02-05 ENCOUNTER — Encounter (HOSPITAL_BASED_OUTPATIENT_CLINIC_OR_DEPARTMENT_OTHER): Payer: Self-pay

## 2020-02-05 SURGERY — ANTERIOR AND POSTERIOR REPAIR WITH SACROSPINOUS FIXATION
Anesthesia: General

## 2020-02-20 ENCOUNTER — Ambulatory Visit: Payer: 59 | Admitting: Cardiology

## 2020-03-07 ENCOUNTER — Other Ambulatory Visit: Payer: Self-pay | Admitting: Internal Medicine

## 2020-03-12 ENCOUNTER — Telehealth: Payer: Self-pay | Admitting: Internal Medicine

## 2020-03-12 NOTE — Telephone Encounter (Signed)
    Patient calling to report she has faxed surgical clearance form to office. Does she need to keep 10/6 appointment?  Please call

## 2020-03-14 ENCOUNTER — Ambulatory Visit: Payer: Self-pay | Admitting: Orthopedic Surgery

## 2020-03-15 NOTE — Telephone Encounter (Signed)
Gave pt a different number to fax forms to. Also informed her Dr. Alain Marion would need to see her in office to sign off on clarence

## 2020-03-15 NOTE — Telephone Encounter (Signed)
Please call patient, patient also wants to know if her surgical clearance was faxed

## 2020-03-20 ENCOUNTER — Ambulatory Visit (INDEPENDENT_AMBULATORY_CARE_PROVIDER_SITE_OTHER): Payer: 59 | Admitting: Internal Medicine

## 2020-03-20 ENCOUNTER — Other Ambulatory Visit: Payer: Self-pay

## 2020-03-20 ENCOUNTER — Encounter: Payer: Self-pay | Admitting: Internal Medicine

## 2020-03-20 VITALS — BP 140/70 | HR 90 | Temp 98.8°F | Ht 62.0 in | Wt 138.0 lb

## 2020-03-20 DIAGNOSIS — F1920 Other psychoactive substance dependence, uncomplicated: Secondary | ICD-10-CM

## 2020-03-20 DIAGNOSIS — F4321 Adjustment disorder with depressed mood: Secondary | ICD-10-CM

## 2020-03-20 DIAGNOSIS — I2510 Atherosclerotic heart disease of native coronary artery without angina pectoris: Secondary | ICD-10-CM

## 2020-03-20 DIAGNOSIS — Z01818 Encounter for other preprocedural examination: Secondary | ICD-10-CM | POA: Insufficient documentation

## 2020-03-20 DIAGNOSIS — J449 Chronic obstructive pulmonary disease, unspecified: Secondary | ICD-10-CM

## 2020-03-20 DIAGNOSIS — Z23 Encounter for immunization: Secondary | ICD-10-CM | POA: Diagnosis not present

## 2020-03-20 DIAGNOSIS — Z634 Disappearance and death of family member: Secondary | ICD-10-CM

## 2020-03-20 MED ORDER — ZOLPIDEM TARTRATE 10 MG PO TABS
10.0000 mg | ORAL_TABLET | Freq: Every evening | ORAL | 1 refills | Status: DC | PRN
Start: 2020-03-20 — End: 2020-09-30

## 2020-03-20 MED ORDER — TRAMADOL HCL 50 MG PO TABS
ORAL_TABLET | ORAL | 1 refills | Status: DC
Start: 2020-03-20 — End: 2020-04-05

## 2020-03-20 NOTE — Assessment & Plan Note (Signed)
On 4 mg of Prednisone a day Needs stress steroids peri-operatively

## 2020-03-20 NOTE — Assessment & Plan Note (Signed)
Not smoking x 2 years

## 2020-03-20 NOTE — Progress Notes (Signed)
Subjective:  Patient ID: Debra Farmer, female    DOB: 04/11/1946  Age: 74 y.o. MRN: [id number]  CC: Medical Clearance and Medication Refill   HPI Debra Farmer presents for pre-op clearance  Req by Dr Rolena Infante  Reason; med clearance for POSTERIOR SPINAL FUSION INTERBODY C3-7, LEFT C3-4 DECOMPRESSION OF CYST  Hx: the pt w/RA, COPD, B12 def, LBP, insomnia -all stable. She has been grieving the death of her youngest son who committed suicide this summer.   Past Medical History:  Diagnosis Date  . Arthritis   . COPD (chronic obstructive pulmonary disease) ([place])   . GERD (gastroesophageal reflux disease)   . Neuromuscular disorder ([place])    bELLS PALSY  2 YEARS AGO   Past Surgical History:  Procedure Laterality Date  . ABDOMINAL HYSTERECTOMY     partial  . APPENDECTOMY    . CATARACT EXTRACTION Bilateral    about 3 years ago   . CHOLECYSTECTOMY    . KNEE ARTHROSCOPY  1999   Dr Theda Sers -- R knee  . LUMBAR LAMINECTOMY/DECOMPRESSION MICRODISCECTOMY Right 08/26/2016   Procedure: Microlumbar decompression L4-5, L5-S1 on Right;  Surgeon: Susa Day, MD;  Location: [facility];  Service: Orthopedics;  Laterality: Right;  . TOTAL HIP ARTHROPLASTY Right 04/01/2017   Procedure: RIGHT TOTAL HIP ARTHROPLASTY ANTERIOR APPROACH;  Surgeon: Rod Can, MD;  Location: [facility];  Service: Orthopedics;  Laterality: Right;  Needs RNFA    reports that she quit smoking about 2 years ago. Her smoking use included cigarettes. She has a 29.00 pack-year smoking history. She has never used smokeless tobacco. She reports that she does not drink alcohol and does not use drugs. family history includes COPD (age of onset: 19) in her father; Heart disease (age of onset: 43) in her mother. Allergies  Allergen Reactions  . Aspirin Other (See Comments)    MUST HAVE COATED ASA  . Codeine Sulfate Other (See Comments)    "drugged out feeling" & hallucinations  . Lipitor [Atorvastatin] Other (See  Comments)    Muscle cramps  . Nabumetone Other (See Comments)    Unsure of reaction (reaction occurred sometime ago)  . Plaquenil [Hydroxychloroquine]     Weak, blurred vision  . Pravastatin     Weak legs, arthralgias  . Senna Other (See Comments)    Causes bleeding from the anus  . Statins Other (See Comments)    Muscle aches and cramps     Outpatient Medications Prior to Visit  Medication Sig Dispense Refill  . aspirin EC 81 MG EC tablet Take 1 tablet (81 mg total) by mouth 2 (two) times daily. (Patient taking differently: Take 81 mg by mouth daily. ) 60 tablet 1  . Cholecalciferol (VITAMIN D3) 5000 units TABS Take 5,000 Units by mouth at bedtime. WITH A GLASS OF VANILLA-HONEY ALMOND MILK    . Cyanocobalamin (VITAMIN B-12) 1000 MCG/15ML LIQD Take 15 mLs by mouth daily.    [name] GARLIC PO Take 1 tablet by mouth daily.    [name] HUMIRA PEN 40 MG/0.4ML PNKT     . ipratropium-albuterol (DUONEB) 0.5-2.5 (3) MG/3ML SOLN Take 3 mLs by nebulization every 4 (four) hours as needed. (Patient taking differently: Take 3 mLs by nebulization every 4 (four) hours as needed (for shortness of breath/wheezing). ) 360 mL 3  . leflunomide (ARAVA) 20 MG tablet Take 20 mg by mouth daily.    . Melatonin 3 MG TABS Take 3 mg by mouth at bedtime as needed (for sleep).    [name]  predniSONE (DELTASONE) 1 MG tablet Take 4 tablets (4 mg total) by mouth daily with breakfast. 120 tablet 1  . Probiotic CAPS Take 1 capsule by mouth daily.    . Pyridoxine HCl (VITAMIN B-6 PO) Take 1 tablet by mouth daily.    [name] senna (SENOKOT) 8.6 MG TABS tablet Take 2 tablets (17.2 mg total) by mouth at bedtime. 120 each 0  . traMADol (ULTRAM) 50 MG tablet TAKE 1 TABLET BY MOUTH 4 TIMES DAILY AS NEEDED 120 tablet 1  . zolpidem (AMBIEN) 10 MG tablet Take 1 tablet (10 mg total) by mouth at bedtime as needed. for sleep 90 tablet 1   No facility-administered medications prior to visit.    ROS: Review of Systems  Constitutional: Negative for  activity change, appetite change, chills, fatigue and unexpected weight change.  HENT: Negative for congestion, mouth sores and sinus pressure.   Eyes: Negative for visual disturbance.  Respiratory: Negative for cough and chest tightness.   Gastrointestinal: Negative for abdominal pain and nausea.  Genitourinary: Negative for difficulty urinating, frequency and vaginal pain.  Musculoskeletal: Positive for arthralgias, back pain, neck pain and neck stiffness. Negative for gait problem.  Skin: Negative for pallor and rash.  Neurological: Positive for weakness and numbness. Negative for dizziness, tremors and headaches.  Psychiatric/Behavioral: Negative for confusion, sleep disturbance and suicidal ideas.    Objective:  BP 140/70 (BP Location: Left Arm, Patient Position: Sitting, Cuff Size: Normal)   Pulse 90   Temp 98.8 F (37.1 C) (Oral)   Ht 5\' 2"  (1.575 m)   Wt 138 lb (62.6 kg)   SpO2 97%   BMI 25.24 kg/m   BP Readings from Last 3 Encounters:  03/20/20 140/70  01/23/20 (!) 142/82  10/26/19 (!) 144/72    Wt Readings from Last 3 Encounters:  03/20/20 138 lb (62.6 kg)  01/23/20 131 lb (59.4 kg)  10/26/19 138 lb (62.6 kg)    Physical Exam Constitutional:      General: She is not in acute distress.    Appearance: Normal appearance. She is well-developed.  HENT:     Head: Normocephalic.     Right Ear: External ear normal.     Left Ear: External ear normal.     Nose: Nose normal.  Eyes:     General:        Right eye: No discharge.        Left eye: No discharge.     Conjunctiva/sclera: Conjunctivae normal.     Pupils: Pupils are equal, round, and reactive to light.  Neck:     Thyroid: No thyromegaly.     Vascular: No JVD.     Trachea: No tracheal deviation.  Cardiovascular:     Rate and Rhythm: Normal rate and regular rhythm.     Heart sounds: Normal heart sounds.  Pulmonary:     Effort: No respiratory distress.     Breath sounds: No stridor. No wheezing.    Abdominal:     General: Bowel sounds are normal. There is no distension.     Palpations: Abdomen is soft. There is no mass.     Tenderness: There is no abdominal tenderness. There is no guarding or rebound.  Musculoskeletal:        General: Tenderness present.     Cervical back: Normal range of motion and neck supple.  Lymphadenopathy:     Cervical: No cervical adenopathy.  Skin:    Findings: No erythema or rash.  Neurological:  Cranial Nerves: No cranial nerve deficit.     Motor: No abnormal muscle tone.     Coordination: Coordination normal.     Deep Tendon Reflexes: Reflexes normal.  Psychiatric:        Behavior: Behavior normal.        Thought Content: Thought content normal.        Judgment: Judgment normal.   LS tender. She is sad  Lab Results  Component Value Date   WBC 7.5 01/23/2020   HGB 14.1 01/23/2020   HCT 41.9 01/23/2020   PLT 282 01/23/2020   GLUCOSE 91 01/23/2020   CHOL 266 (H) 10/26/2019   TRIG 168.0 (H) 10/26/2019   HDL 58.00 10/26/2019   LDLDIRECT 170.0 12/05/2014   LDLCALC 174 (H) 10/26/2019   ALT 20 01/23/2020   AST 22 01/23/2020   NA 137 01/23/2020   K 4.5 01/23/2020   CL 99 01/23/2020   CREATININE 0.72 01/23/2020   BUN 13 01/23/2020   CO2 34 (H) 01/23/2020   TSH 1.83 01/23/2020   HGBA1C 5.6 01/23/2020    DG Chest 2 View  Result Date: 09/21/2017 CLINICAL DATA:  74 year old female with anterior chest pain onset this morning. Smoker. EXAM: CHEST - 2 VIEW COMPARISON:  Chest radiographs 09/17/2015 and earlier. FINDINGS: Mildly lower lung volumes today. Chronic increased bilateral pulmonary interstitial markings appear stable. Mediastinal contours appear stable and within normal limits. Visualized tracheal air column is within normal limits. No pneumothorax, pleural effusion, confluent or acute pulmonary opacity. No acute osseous abnormality identified. Stable cholecystectomy clips. Negative visible bowel gas pattern. IMPRESSION: Chronic  interstitial lung changes appear stable since 2017. No acute cardiopulmonary abnormality. Electronically Signed   By: Genevie Ann M.D.   On: 09/21/2017 14:32   CT Angio Chest PE W and/or Wo Contrast  Result Date: 09/21/2017 CLINICAL DATA:  74 year old female with acute chest pain today, inspiratory pain. EXAM: CT ANGIOGRAPHY CHEST WITH CONTRAST TECHNIQUE: Multidetector CT imaging of the chest was performed using the standard protocol during bolus administration of intravenous contrast. Multiplanar CT image reconstructions and MIPs were obtained to evaluate the vascular anatomy. CONTRAST:  165mL ISOVUE-370 IOPAMIDOL (ISOVUE-370) INJECTION 76% COMPARISON:  CT chest abdomen and pelvis 03/23/2006. FINDINGS: Cardiovascular: Excellent contrast bolus timing in the pulmonary arterial tree. No focal filling defect identified in the pulmonary arteries to suggest acute pulmonary embolism. Calcified coronary artery atherosclerosis and Calcified aortic atherosclerosis appear increased since 2007. Negative visible aorta otherwise. Cardiac size is within normal limits. Suggestion of left ventricular hypertrophy. No pericardial effusion. Mediastinum/Nodes: No lymphadenopathy.  Negative thoracic inlet. Lungs/Pleura: The major airways are patent. Lung volumes are lower compared to 2007 and there is bilateral pulmonary mosaic attenuation which might reflect areas of gas trapping superimposed on mild atelectasis. There is mild generalized peribronchial thickening. No pleural effusion and no other abnormal pulmonary opacity. Upper Abdomen: Negative visible noncontrast liver, spleen, pancreas, left adrenal gland, left kidney, and bowel in the upper abdomen. Musculoskeletal: Negative. Review of the MIP images confirms the above findings. IMPRESSION: 1.  Negative for acute pulmonary embolus. 2. Suspected mild atelectasis and gas trapping in both lungs. Generalized peribronchial thickening which might be infectious or inflammatory. 3.  Calcified coronary artery and aortic atherosclerosis. Suggestion of left ventricular hypertrophy. Electronically Signed   By: Genevie Ann M.D.   On: 09/21/2017 18:59    Assessment & Plan:    Walker Kehr, MD

## 2020-03-20 NOTE — Assessment & Plan Note (Signed)
Pat's youngest son shot himself in June 2021 Discussed

## 2020-03-22 ENCOUNTER — Encounter: Payer: Self-pay | Admitting: Internal Medicine

## 2020-03-22 NOTE — Assessment & Plan Note (Signed)
She would benefit from using inhalers perioperatively

## 2020-03-22 NOTE — Assessment & Plan Note (Addendum)
The patient is medically clear for surgery assuming her preop tests are acceptable. She has been on chronic steroids, lately on 4 mg of Prednisone a day She needs stress steroids peri-operatively. She would benefit from using inhalers perioperatively Thank you,

## 2020-03-26 ENCOUNTER — Ambulatory Visit: Payer: Self-pay | Admitting: Orthopedic Surgery

## 2020-03-26 NOTE — H&P (Deleted)
  The note originally documented on this encounter has been moved the the encounter in which it belongs.  

## 2020-03-26 NOTE — H&P (Signed)
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 Therefore, the patient would like to proceed with surgical intervention. The patient is here today for a pre-operative History and Physical. They are scheduled for DAY 1 -ACDF C3-7 DAY 2- PSFI C3-7, DECOMPRESSION C3-4 CYST on 10-20 AND 04-04-20 with Dr. Rolena Infante at [facility].  Patient Active Problem List   Diagnosis Date Noted  . Grief at loss of child 03/20/2020  . 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 Preop exam for internal medicine 03/20/2020  . Bilateral hand numbness 01/24/2020  . Coronary atherosclerosis 09/22/2017  . Chest pain, atypical 09/22/2017  . Primary osteoarthritis of right hip 04/01/2017  . Osteoarthritis of right hip 04/01/2017  . Osteoporosis 03/03/2017  . Decreased libido 10/19/2016  . Spinal stenosis at L4-L5 level 08/26/2016  . 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 Diarrhea 09/23/2015  . Bell's palsy 03/02/2015  . Seronegative rheumatoid arthritis ([place]) 03/02/2015  . Neck pain 04/26/2014  . Herpes zoster 04/05/2014  . Thrush, oral 03/06/2014  . Left arm swelling 01/15/2013  . Cough 09/19/2012  . Insomnia 09/01/2012  . CTS (carpal tunnel syndrome) 04/15/2012  . Arthritis of multiple sites 03/04/2012  . Well adult exam 01/27/2012  . Knee pain, bilateral 01/08/2012  . Hip pain, right 12/26/2010  . DISCITIS 07/25/2010  . SWEATING 05/23/2010  . PARESTHESIA, HANDS 04/25/2010  . COPD mixed type ([place]) 07/26/2009  . SINUSITIS, ACUTE 08/01/2008  . RASH AND OTHER NONSPECIFIC SKIN ERUPTION 08/01/2008  . TOBACCO USE DISORDER/SMOKER-SMOKING CESSATION  DISCUSSED 03/08/2008  . BRONCHITIS, ACUTE 03/08/2008  . HYPERLIPIDEMIA 02/27/2008  . CRAMP OF LIMB 02/27/2008  . FATIGUE 02/27/2008  . Wheezing 02/27/2008  . MENINGITIS 08/11/2007  . GERD 08/11/2007  . HEADACHE 08/11/2007  . OSTEOARTHRITIS 01/11/2007   Past Medical History:  Diagnosis Date  . Arthritis   . COPD (chronic obstructive pulmonary disease) ([place])   . GERD (gastroesophageal reflux disease)   . Neuromuscular disorder ([place])    bELLS PALSY  2 YEARS AGO    Past Surgical History:  Procedure Laterality Date  . ABDOMINAL HYSTERECTOMY     partial  . APPENDECTOMY    . CATARACT EXTRACTION Bilateral    about 3 years ago   . CHOLECYSTECTOMY    . KNEE ARTHROSCOPY  1999   Dr Theda Sers -- R knee  . LUMBAR LAMINECTOMY/DECOMPRESSION MICRODISCECTOMY Right 08/26/2016   Procedure: Microlumbar decompression L4-5, L5-S1 on Right;  Surgeon: Susa Day, MD;  Location: [facility];  Service: Orthopedics;  Laterality: Right;  . TOTAL HIP ARTHROPLASTY Right 04/01/2017   Procedure: RIGHT TOTAL HIP ARTHROPLASTY ANTERIOR APPROACH;  Surgeon: Rod Can, MD;  Location: [facility];  Service: Orthopedics;  Laterality: Right;  Needs RNFA    Current Outpatient Medications  Medication Sig Dispense Refill Last Dose  . aspirin EC 81 MG EC tablet Take 1 tablet (81 mg total) by mouth 2 (two) times daily. (Patient taking differently: Take 81 mg by mouth daily. ) 60 tablet 1   . Cholecalciferol (VITAMIN D3) 5000 units TABS Take 5,000 Units by mouth in the morning and at bedtime. WITH A GLASS OF VANILLA-HONEY ALMOND MILK     . leflunomide (ARAVA) 20 MG  tablet Take 20 mg by mouth daily.     . Melatonin 3 MG TABS Take 3 mg by mouth at bedtime as needed (for sleep).     . Multiple Vitamins-Minerals (MULTIVITAMIN WITH MINERALS) tablet Take 1 tablet by mouth daily. Immune     . predniSONE (DELTASONE) 1 MG tablet Take 4 tablets (4 mg total) by mouth daily with breakfast. 120 tablet 1   . traMADol (ULTRAM) 50 MG tablet  TAKE 1 TABLET BY MOUTH 4 TIMES DAILY AS NEEDED (Patient taking differently: Take 50 mg by mouth 4 (four) times daily. ) 120 tablet 1   . vitamin B-12 (CYANOCOBALAMIN) 1000 MCG tablet Take 3,000 mcg by mouth in the morning and at bedtime.      [name] zolpidem (AMBIEN) 10 MG tablet Take 1 tablet (10 mg total) by mouth at bedtime as needed. for sleep (Patient taking differently: Take 10 mg by mouth at bedtime. ) 90 tablet 1    No current facility-administered medications for this visit.   Allergies  Allergen Reactions  . Aspirin Other (See Comments)    MUST HAVE COATED ASA  . Codeine Sulfate Other (See Comments)    "drugged out feeling" & hallucinations  . Lipitor [Atorvastatin] Other (See Comments)    Muscle cramps  . Nabumetone Other (See Comments)    Unsure of reaction (reaction occurred sometime ago)  . Plaquenil [Hydroxychloroquine]     Weak, blurred vision  . Pravastatin     Weak legs, arthralgias  . Senna Other (See Comments)    Causes bleeding from the anus  . Statins Other (See Comments)    Muscle aches and cramps    Social History   Tobacco Use  . Smoking status: Former Smoker    Packs/day: 0.50    Years: 58.00    Pack years: 29.00    Types: Cigarettes    Quit date: 02/15/2018    Years since quitting: 2.1  . Smokeless tobacco: Never Used  Substance Use Topics  . Alcohol use: No    Family History  Problem Relation Age of Onset  . Heart disease Mother 46       CHF  . COPD Father 40       emphesema    Review of Systems As stated in HPI  Objective:   Clinical exam: Debra Farmer is a pleasant individual, who appears younger than their stated age. She is alert and orientated 3. No shortness of breath, chest pain.  Heart: Regular rate and rhythm, no rubs, murmurs, or gallops  Lungs: Clear auscultation bilaterally  Abdomen is soft and non-tender, negative loss of bowel and bladder control, no rebound tenderness. Bowel sounds 4  Negative: skin lesions abrasions  contusions Peripheral pulses: 1+ dorsalis pedis/posterior tibialis/radial artery pulses bilaterally. Compartment soft and nontender. Gait pattern: Ataxic gait pattern. Unable to do heel toe ambulation. Positive Romberg's test. Assistive devices: None Neuro: Left upper extremity: 5/5 motor strength except for 4/5 tricep strength. Right upper extremity: 4/5 deltoid, bicep, tricep, wrist extensor strength. 5/5 grip strength. Positive Babinski test bilaterally. Negative Hoffman test. Positive Lhermitte sign bilaterally right side worse than the left. 1+ deep tendon reflexes in the upper extremity symmetrically. 2+ deep tendon reflexes in the lower extremity at the knee and absent at the Achilles. No clonus. Musculoskeletal: Significant neck pain with palpation and range of motion. No shoulder, elbow, wrist pain with isolated joint range of motion. X-rays of the cervical spine on 02/13/20 were reviewed: Patient has advanced degenerative disc disease  at C6-7 with loss of normal disc space height. Anterior listhesis C3-4 and C4-5. Mild degenerative disc disease at C5-6. Significant facet arthrosis is noted at C3-4 and C2-3. Cervical MRI: completed on 03/07/20 was reviewed with the patient. It was completed at [facility]; I have independently reviewed the images as well as the radiology report. Positive cord signal changes at the C3-4 level. 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 Large left-sided C3-4 facet cyst causing lateral recess stenosis. Degenerative grade 1 anterolisthesis C3-4, C4-5, and C5-6. I will stenosis at C4-5 but there is severe right neural foraminal narrowing. Mild to moderate foraminal and central stenosis at C5-6. Severe foraminal stenosis on the left side at C6-7 with compression of the exiting C7 nerve root. Mild neural foraminal narrowing at C2-3. Positive loss of overall are cervical lordosis is noted.  Assessment:    Alivya is a very pleasant  74 year old with a history of rheumatoid arthritis who presents with a 3 month history of gait disturbance, grip strength loss in manual dexterity issues, and significant neck and radicular arm pain bilaterally. 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 Clinical exam confirms symptomatic right C5 neural compression and left C7 neural compression. Imaging studies confirm severe foraminal stenosis on the right side at C4-5 affecting the exiting C5 nerve root as well as C6-7 affecting the left C7 exiting nerve root. In addition she has a large left C3-4 synovial cyst.   Plan:   At this point time I have gone over her clinical exam and history with her in great detail. We have discussed the natural history of cervical myelopathy which is one of progression. Based on her clinical symptoms I am recommending surgical intervention.  Although the myelopathy is her primary concern I did tell her that since she is having radicular symptoms at C4-5 and C6-7 that we should also address this. Therefore in order to provide her with the best overall option we would have to address all 4 levels. Because of the posterior facet cyst and the history of rheumatoid arthritis and the fact she is on prednisone I would do a posterior C3-4 decompression on the left side and then do a supplemental fixation and fusion from C3-7.  I have discussed this procedure in great detail. We have reviewed the risks and benefits of surgery and all of her questions were encouraged.  Risks and benefits of surgery were discussed with the patient. These include: Infection, bleeding, death, stroke, paralysis, ongoing or worse pain, need for additional surgery, nonunion, leak of spinal fluid, adjacent segment degeneration requiring additional fusion surgery. Pseudoarthrosis (nonunion)requiring supplemental posterior fixation. Throat pain, swallowing difficulties, hoarseness or change in voice. Additional  risks of the posterior procedure include: Malposition of the hardware, ongoing or worse pain, need for additional surgery, nonunion, hardware malfunction/breakage, leak of spinal fluid.   We have obtained preoperative medical clearance from her primary care provider as well as her rheumatologist. She will hold her aspirin 7 days prior to surgery and we will restart at 48 hours. She is not on any other blood thinners. I have advised her to hold any over-the-counter anti-inflammatory medications leading up to surgery and she did express understanding of this. She may take Tylenol and tramadol for pain. Her rheumatologist has recommended she stay on her medications including prednisone and leflunomide. I did have a discussion with the patient that these medications increase her risk of infection and that we will have to keep a close eye on this for  signs of infection. She did express understanding of this and would still like to proceed with surgical intervention.  We have also discussed the post-operative recovery period to include: bathing/showering restrictions, wound healing, activity (and driving) restrictions, medications/pain mangement.  We have also discussed post-operative redflags to include: signs and symptoms of postoperative infection, DVT/PE.  All patients questions were invited.  Follow-up: 2 weeks post

## 2020-03-31 NOTE — Pre-Procedure Instructions (Signed)
[facility] - [place] - [address] [address] [place] [address] Phone: [phone] Fax: [phone]    Your procedure is scheduled on Wed. Oct. 20, 2021 from 9:59AM-4:43PM  Report to [facility] Entrance "A" at 7:55AM  Call this number if you have problems the morning of surgery:  [phone]   Remember:  Do not eat or drink after midnight on Oct. 19th     Take these medicines the morning of surgery with A SIP OF WATER: PredniSONE (DELTASONE)     TraMADol (ULTRAM)    Follow your surgeon's instructions on when to stop Aspirin.  If no instructions were given by your surgeon then you will need to call the office to get those instructions.    As of today, STOP taking all Aspirin (unless instructed by your doctor) and Other Aspirin containing products, Vitamins, Fish oils, and Herbal medications. Also stop all NSAIDS i.e. Advil, Ibuprofen, Motrin, Aleve, Anaprox, Naproxen, BC, Goody Powders, and all Supplements.   No Smoking of any kind, Tobacco/Vaping, or Alcohol products 24 hours prior to your procedure. If you use a Cpap at night, you may bring all equipment for your overnight stay.   Special instructions:   [facility]- Preparing For Surgery  Before surgery, you can play an important role. Because skin is not sterile, your skin needs to be as free of germs as possible. You can reduce the number of germs on your skin by washing with CHG (chlorahexidine gluconate) Soap before surgery.  CHG is an antiseptic cleaner which kills germs and bonds with the skin to continue killing germs even after washing.    Please do not use if you have an allergy to CHG or antibacterial soaps. If your skin becomes reddened/irritated stop using the CHG.  Do not shave (including legs and underarms) for at least 48 hours prior to first CHG shower. It is OK to shave your face.  Please follow these instructions  carefully.   1. Shower the NIGHT BEFORE SURGERY and the MORNING OF SURGERY with CHG.   2. If you chose to wash your hair, wash your hair first as usual with your normal shampoo.  3. After you shampoo, rinse your hair and body thoroughly to remove the shampoo.  4. Use CHG as you would any other liquid soap. You can apply CHG directly to the skin and wash gently with a scrungie or a clean washcloth.   5. Apply the CHG Soap to your body ONLY FROM THE NECK DOWN.  Do not use on open wounds or open sores. Avoid contact with your eyes, ears, mouth and genitals (private parts). Wash Face and genitals (private parts)  with your normal soap.  6. Wash thoroughly, paying special attention to the area where your surgery will be performed.  7. Thoroughly rinse your body with warm water from the neck down.  8. DO NOT shower/wash with your normal soap after using and rinsing off the CHG Soap.  9. Pat yourself dry with a CLEAN TOWEL.  10. Wear CLEAN PAJAMAS to bed the night before surgery, wear comfortable clothes the morning of surgery  11. Place CLEAN SHEETS on your bed the night of your first shower and DO NOT SLEEP WITH PETS.   Day of Surgery:             Remember to brush your teeth WITH YOUR REGULAR TOOTHPASTE.  Do not wear jewelry, make-up or nail polish.  Do  not wear lotions, powders, or perfumes, or deodorant.  Do not shave 48 hours prior to surgery.    Do not bring valuables to the hospital.  [facility] is not responsible for any belongings or valuables.  Contacts, dentures or bridgework may not be worn into surgery.     After surgery it may be brought to your room.  For patients admitted to the hospital, discharge time will be determined by your treatment team.  Patients discharged the day of surgery will not be allowed to drive home, and someone age 42 and over needs to stay with them for 24 hours. Please wear clean clothes to the hospital/surgery center.    Please read over the  following fact sheets that you were given.

## 2020-04-01 ENCOUNTER — Other Ambulatory Visit (HOSPITAL_COMMUNITY)
Admission: RE | Admit: 2020-04-01 | Discharge: 2020-04-01 | Disposition: A | Discharge status: Home / Self Care | Payer: 59 | Source: Ambulatory Visit | Attending: Orthopedic Surgery | Admitting: Orthopedic Surgery

## 2020-04-01 ENCOUNTER — Encounter (HOSPITAL_COMMUNITY): Payer: Self-pay

## 2020-04-01 ENCOUNTER — Other Ambulatory Visit: Payer: Self-pay

## 2020-04-01 ENCOUNTER — Ambulatory Visit (HOSPITAL_COMMUNITY)
Admission: RE | Admit: 2020-04-01 | Discharge: 2020-04-01 | Disposition: A | Discharge status: Home / Self Care | Payer: 59 | Source: Ambulatory Visit | Attending: Orthopedic Surgery | Admitting: Orthopedic Surgery

## 2020-04-01 ENCOUNTER — Encounter (HOSPITAL_COMMUNITY)
Admission: RE | Admit: 2020-04-01 | Discharge: 2020-04-01 | Disposition: A | Discharge status: Home / Self Care | Payer: 59 | Source: Ambulatory Visit | Attending: Orthopedic Surgery | Admitting: Orthopedic Surgery

## 2020-04-01 DIAGNOSIS — Z01818 Encounter for other preprocedural examination: Secondary | ICD-10-CM | POA: Diagnosis present

## 2020-04-01 DIAGNOSIS — Z20822 Contact with and (suspected) exposure to covid-19: Secondary | ICD-10-CM | POA: Diagnosis not present

## 2020-04-01 LAB — URINALYSIS, ROUTINE W REFLEX MICROSCOPIC
Bilirubin Urine: NEGATIVE
Glucose, UA: NEGATIVE mg/dL
Hgb urine dipstick: NEGATIVE
Ketones, ur: NEGATIVE mg/dL
Leukocytes,Ua: NEGATIVE
Nitrite: NEGATIVE
Protein, ur: NEGATIVE mg/dL
Specific Gravity, Urine: 1.011 (ref 1.005–1.030)
pH: 6 (ref 5.0–8.0)

## 2020-04-01 LAB — SURGICAL PCR SCREEN
MRSA, PCR: NEGATIVE
Staphylococcus aureus: POSITIVE — AB

## 2020-04-01 LAB — TYPE AND SCREEN
ABO/RH(D): O POS
Antibody Screen: NEGATIVE

## 2020-04-01 LAB — BASIC METABOLIC PANEL
Anion gap: 12 (ref 5–15)
BUN: 8 mg/dL (ref 8–23)
CO2: 24 mmol/L (ref 22–32)
Calcium: 9.4 mg/dL (ref 8.9–10.3)
Chloride: 100 mmol/L (ref 98–111)
Creatinine, Ser: 0.68 mg/dL (ref 0.44–1.00)
GFR, Estimated: >60 mL/min (ref >60)
Glucose, Bld: 120 mg/dL — ABNORMAL HIGH (ref 70–99)
Potassium: 3.4 mmol/L — ABNORMAL LOW (ref 3.5–5.1)
Sodium: 136 mmol/L (ref 135–145)

## 2020-04-01 LAB — CBC
HCT: 46.1 % — ABNORMAL HIGH (ref 36.0–46.0)
Hemoglobin: 14.5 g/dL (ref 12.0–15.0)
MCH: 28.9 pg (ref 26.0–34.0)
MCHC: 31.5 g/dL (ref 30.0–36.0)
MCV: 91.8 fL (ref 80.0–100.0)
Platelets: 324 10*3/uL (ref 150–400)
RBC: 5.02 MIL/uL (ref 3.87–5.11)
RDW: 13.3 % (ref 11.5–15.5)
WBC: 11.7 10*3/uL — ABNORMAL HIGH (ref 4.0–10.5)
nRBC: 0 % (ref 0.0–0.2)

## 2020-04-01 LAB — PROTIME-INR
INR: 0.9 (ref 0.8–1.2)
Prothrombin Time: 12.2 seconds (ref 11.4–15.2)

## 2020-04-01 LAB — SARS CORONAVIRUS 2 (TAT 6-24 HRS): SARS Coronavirus 2: NEGATIVE

## 2020-04-01 LAB — APTT: aPTT: 28 seconds (ref 24–36)

## 2020-04-01 NOTE — Progress Notes (Signed)
PCP - Dr. Alain Marion Cardiologist - patient denies  PPM/ICD - n/a Device Orders -  Rep Notified -   Chest x-ray - 04/01/2020 EKG - 01/26/2020 Stress Test - 07/22/2000 ECHO - patient denies Cardiac Cath - patient denies  Sleep Study - patient denies CPAP -   Fasting Blood Sugar - n/a Checks Blood Sugar _____ times a day  Blood Thinner Instructions: Aspirin Instructions: patient stated that she has been off her ASA for at least a month because she ran out and stated Dr. Rolena Infante told her to stop.  ERAS Protcol - n/a PRE-SURGERY Ensure or G2-   COVID TEST- 04/01/2020 after PAT appointment   Anesthesia review: yes, per order  Patient denies shortness of breath, fever, cough and chest pain at PAT appointment   All instructions explained to the patient, with a verbal understanding of the material. Patient agrees to go over the instructions while at home for a better understanding. Patient also instructed to self quarantine after being tested for COVID-19. The opportunity to ask questions was provided.

## 2020-04-02 ENCOUNTER — Ambulatory Visit: Payer: Self-pay | Admitting: Orthopedic Surgery

## 2020-04-02 ENCOUNTER — Encounter (HOSPITAL_COMMUNITY): Payer: Self-pay

## 2020-04-02 ENCOUNTER — Encounter (HOSPITAL_COMMUNITY): Payer: Self-pay | Admitting: Orthopedic Surgery

## 2020-04-02 NOTE — Progress Notes (Signed)
Anesthesia Chart Review:  Case: [id number] Date/Time: 04/03/20 0815   Procedure: ANTERIOR CERVICAL DECOMPRESSION/DISCECTOMY FUSION C3-7 (N/A ) - 5 hrs   Anesthesia type: General   Pre-op diagnosis: Cervical spndylotic meyloradiculopathy   Location: [facility] OR ROOM 04 / [facility]   Surgeons: Melina Schools, MD      DISCUSSION: Patient is a 74 year old female scheduled for the above procedure.  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   Preoperative evaluation by PCP Plotnikov, Evie Lacks, MD on 03/22/20. He wrote,  "The patient is medically clear for surgery assuming her preop tests are acceptable. She has been on chronic steroids, lately on 4 mg of Prednisone a day She needs stress steroids peri-operatively. She would benefit from using inhalers perioperatively". His notes she has known coronary calcifications on prior imaging and has since stopped smoking. He also notes she is grieving the loss of her youngest son who committed suicide this summer.   04/02/2020 presurgical COVID-19 test negative.  Anesthesia team to evaluate on the day of surgery. Her ASA is on hold for surgery.    VS: BP (!) 158/76   Pulse 94   Temp 36.9 C (Oral)   Resp 19   Ht 5' (1.524 m)   Wt 62.3 kg   SpO2 97%   BMI 26.83 kg/m   PROVIDERS: Plotnikov, Evie Lacks, MD is PCP  Gavin Pound, MD is rheumatologist   LABS: Labs reviewed: Acceptable for surgery. (all labs ordered are listed, but only abnormal results are displayed)  Labs Reviewed  SURGICAL PCR SCREEN - Abnormal; Notable for the following components:      Result Value   Staphylococcus aureus POSITIVE (*)    All other components within normal limits  BASIC METABOLIC PANEL - Abnormal; Notable for the following components:   Potassium 3.4 (*)    Glucose, Bld 120 (*)    All other components within normal limits  CBC -  Abnormal; Notable for the following components:   WBC 11.7 (*)    HCT 46.1 (*)    All other components within normal limits  URINALYSIS, ROUTINE W REFLEX MICROSCOPIC - Abnormal; Notable for the following components:   APPearance HAZY (*)    All other components within normal limits  APTT  PROTIME-INR  TYPE AND SCREEN    IMAGES: CXR 04/01/20: FINDINGS: The heart size and mediastinal contours are within normal limits. Both lungs are clear. The visualized skeletal structures are unremarkable. Aortic atherosclerosis. IMPRESSION: No active cardiopulmonary disease.   EKG: 01/23/20: NSR   CV: Remote stress test 07/21/00 showed no significant abnormality, breast attenuation involving the anteroapical segment, normal wall motion, EF 61%.   Past Medical History:  Diagnosis Date  . Arthritis    RA  . COPD (chronic obstructive pulmonary disease) ([place])   . GERD (gastroesophageal reflux disease)   . Neuromuscular disorder ([place]) 02/05/2015   Bells Palsy, right    Past Surgical History:  Procedure Laterality Date  . ABDOMINAL HYSTERECTOMY     partial  . APPENDECTOMY    . CATARACT EXTRACTION Bilateral    about 3 years ago   . CHOLECYSTECTOMY    . KNEE ARTHROSCOPY  1999   Dr Theda Sers -- R knee  . LUMBAR LAMINECTOMY/DECOMPRESSION MICRODISCECTOMY Right 08/26/2016   Procedure: Microlumbar decompression L4-5, L5-S1 on Right;  Surgeon: Susa Day, MD;  Location: [facility] ORS;  Service: Orthopedics;  Laterality: Right;  .  TOTAL HIP ARTHROPLASTY Right 04/01/2017   Procedure: RIGHT TOTAL HIP ARTHROPLASTY ANTERIOR APPROACH;  Surgeon: Rod Can, MD;  Location: [facility] ORS;  Service: Orthopedics;  Laterality: Right;  Needs RNFA    MEDICATIONS: . aspirin EC 81 MG EC tablet  . Cholecalciferol (VITAMIN D3) 5000 units TABS  . leflunomide (ARAVA) 20 MG tablet  . Melatonin 3 MG TABS  . Multiple Vitamins-Minerals (MULTIVITAMIN WITH MINERALS) tablet  . predniSONE (DELTASONE) 1 MG tablet  . traMADol  (ULTRAM) 50 MG tablet  . vitamin B-12 (CYANOCOBALAMIN) 1000 MCG tablet  . zolpidem (AMBIEN) 10 MG tablet   No current facility-administered medications for this encounter.    Myra Gianotti, PA-C Surgical Short Stay/Anesthesiology [facility] Phone [phone] [facility] Phone [phone] 04/02/2020 10:12 AM

## 2020-04-02 NOTE — Anesthesia Preprocedure Evaluation (Addendum)
Anesthesia Evaluation  Patient identified by MRN, date of birth, ID band Patient awake    Reviewed: Allergy & Precautions, NPO status , Unable to perform ROS - Chart review only  Airway Mallampati: I  TM Distance: >3 FB Neck ROM: Limited    Dental no notable dental hx.    Pulmonary COPD, Patient abstained from smoking., former smoker,    Pulmonary exam normal breath sounds clear to auscultation       Cardiovascular Exercise Tolerance: Good + CAD  Normal cardiovascular exam Rhythm:Regular Rate:Normal     Neuro/Psych  Headaches, PSYCHIATRIC DISORDERS Depression  Neuromuscular disease (h/o Bell's palsy on the right)    GI/Hepatic Neg liver ROS, GERD  ,  Endo/Other  negative endocrine ROS  Renal/GU negative Renal ROS  negative genitourinary   Musculoskeletal  (+) Arthritis , Osteoarthritis,    Abdominal Normal abdominal exam  (+)   Peds  Hematology negative hematology ROS (+)   Anesthesia Other Findings   Reproductive/Obstetrics negative OB ROS                           Anesthesia Physical Anesthesia Plan  ASA: III  Anesthesia Plan: General   Post-op Pain Management:    Induction: Intravenous  PONV Risk Score and Plan: 3 and Midazolam, Propofol infusion, Dexamethasone and Ondansetron  Airway Management Planned: Oral ETT  Additional Equipment: Arterial line  Intra-op Plan:   Post-operative Plan: Extubation in OR  Informed Consent: I have reviewed the patients History and Physical, chart, labs and discussed the procedure including the risks, benefits and alternatives for the proposed anesthesia with the patient or authorized representative who has indicated his/her understanding and acceptance.     Dental advisory given  Plan Discussed with: CRNA and Anesthesiologist  Anesthesia Plan Comments: (Arterial line given length of case as well as patient's chronic use of steroids  predisposing her to risk of hypotension. GETA. Will give stress dose steroids as recommended by PCP. Norton Blizzard, MD    PAT note written 04/02/2020 by Myra Gianotti, PA-C.  Preoperative evaluation by PCP Plotnikov, Evie Lacks, MD on 03/22/20. He wrote,  "The patient is medically clear for surgery assuming her preop tests are acceptable. She has been on chronic steroids, lately on 4 mg of Prednisone a day She needs stress steroids peri-operatively. She would benefit from using inhalers perioperatively". )      Anesthesia Quick Evaluation

## 2020-04-03 ENCOUNTER — Inpatient Hospital Stay (HOSPITAL_COMMUNITY): Payer: 59 | Admitting: Vascular Surgery

## 2020-04-03 ENCOUNTER — Inpatient Hospital Stay (HOSPITAL_COMMUNITY): Payer: 59 | Admitting: Anesthesiology

## 2020-04-03 ENCOUNTER — Inpatient Hospital Stay (HOSPITAL_COMMUNITY)
Admission: RE | Disposition: A | Discharge status: Home Health | Payer: Self-pay | Source: Home / Self Care | Attending: Orthopedic Surgery

## 2020-04-03 ENCOUNTER — Inpatient Hospital Stay (HOSPITAL_COMMUNITY): Payer: 59

## 2020-04-03 ENCOUNTER — Inpatient Hospital Stay (HOSPITAL_COMMUNITY)
Admission: RE | Admit: 2020-04-03 | Discharge: 2020-04-05 | DRG: 454 | Disposition: A | Discharge status: Home Health | Payer: 59 | Attending: Orthopedic Surgery | Admitting: Orthopedic Surgery

## 2020-04-03 ENCOUNTER — Encounter (HOSPITAL_COMMUNITY): Payer: Self-pay | Admitting: Orthopedic Surgery

## 2020-04-03 ENCOUNTER — Other Ambulatory Visit: Payer: Self-pay

## 2020-04-03 DIAGNOSIS — Z7952 Long term (current) use of systemic steroids: Secondary | ICD-10-CM | POA: Diagnosis not present

## 2020-04-03 DIAGNOSIS — Z20822 Contact with and (suspected) exposure to covid-19: Secondary | ICD-10-CM | POA: Diagnosis present

## 2020-04-03 DIAGNOSIS — Z90710 Acquired absence of both cervix and uterus: Secondary | ICD-10-CM | POA: Diagnosis not present

## 2020-04-03 DIAGNOSIS — E785 Hyperlipidemia, unspecified: Secondary | ICD-10-CM | POA: Diagnosis not present

## 2020-04-03 DIAGNOSIS — J449 Chronic obstructive pulmonary disease, unspecified: Secondary | ICD-10-CM | POA: Diagnosis present

## 2020-04-03 DIAGNOSIS — I2510 Atherosclerotic heart disease of native coronary artery without angina pectoris: Secondary | ICD-10-CM | POA: Diagnosis present

## 2020-04-03 DIAGNOSIS — M4802 Spinal stenosis, cervical region: Secondary | ICD-10-CM | POA: Diagnosis present

## 2020-04-03 DIAGNOSIS — M7138 Other bursal cyst, other site: Secondary | ICD-10-CM | POA: Diagnosis present

## 2020-04-03 DIAGNOSIS — M5412 Radiculopathy, cervical region: Secondary | ICD-10-CM | POA: Diagnosis present

## 2020-04-03 DIAGNOSIS — M069 Rheumatoid arthritis, unspecified: Secondary | ICD-10-CM | POA: Diagnosis present

## 2020-04-03 DIAGNOSIS — Z87891 Personal history of nicotine dependence: Secondary | ICD-10-CM

## 2020-04-03 DIAGNOSIS — Z419 Encounter for procedure for purposes other than remedying health state, unspecified: Secondary | ICD-10-CM

## 2020-04-03 DIAGNOSIS — M4712 Other spondylosis with myelopathy, cervical region: Secondary | ICD-10-CM | POA: Diagnosis present

## 2020-04-03 DIAGNOSIS — K219 Gastro-esophageal reflux disease without esophagitis: Secondary | ICD-10-CM | POA: Diagnosis present

## 2020-04-03 DIAGNOSIS — M542 Cervicalgia: Secondary | ICD-10-CM | POA: Diagnosis present

## 2020-04-03 DIAGNOSIS — M810 Age-related osteoporosis without current pathological fracture: Secondary | ICD-10-CM | POA: Diagnosis present

## 2020-04-03 DIAGNOSIS — G959 Disease of spinal cord, unspecified: Secondary | ICD-10-CM | POA: Diagnosis present

## 2020-04-03 HISTORY — PX: ANTERIOR CERVICAL DECOMPRESSION/DISCECTOMY FUSION 4 LEVELS: SHX5556

## 2020-04-03 SURGERY — ANTERIOR CERVICAL DECOMPRESSION/DISCECTOMY FUSION 4 LEVELS
Anesthesia: General

## 2020-04-03 MED ORDER — ACETAMINOPHEN 10 MG/ML IV SOLN
INTRAVENOUS | Status: DC | PRN
  Administered 2020-04-03: 1000 mg via INTRAVENOUS

## 2020-04-03 MED ORDER — ORAL CARE MOUTH RINSE: 15.0000 mL | Freq: Once | OROMUCOSAL | Status: AC

## 2020-04-03 MED ORDER — GABAPENTIN 300 MG PO CAPS
300.0000 mg | ORAL_CAPSULE | Freq: Three times a day (TID) | ORAL | Status: DC
  Administered 2020-04-03 (×2): 300 mg via ORAL
  Filled 2020-04-03 (×4): qty 1

## 2020-04-03 MED ORDER — LACTATED RINGERS IV SOLN: INTRAVENOUS | Status: DC

## 2020-04-03 MED ORDER — HYDROCORTISONE NA SUCCINATE PF 1000 MG IJ SOLR
INTRAMUSCULAR | Status: DC | PRN
  Administered 2020-04-03 (×2): 50 mg via INTRAVENOUS

## 2020-04-03 MED ORDER — CEFAZOLIN SODIUM-DEXTROSE 2-4 GM/100ML-% IV SOLN
2.0000 g | INTRAVENOUS | Status: AC
  Administered 2020-04-03: 2 g via INTRAVENOUS
  Filled 2020-04-03: qty 100

## 2020-04-03 MED ORDER — SODIUM CHLORIDE 0.9 % IV SOLN: 250.0000 mL | INTRAVENOUS | Status: DC

## 2020-04-03 MED ORDER — SUCCINYLCHOLINE CHLORIDE 200 MG/10ML IV SOSY
PREFILLED_SYRINGE | INTRAVENOUS | Status: AC
  Filled 2020-04-03: qty 10

## 2020-04-03 MED ORDER — SODIUM CHLORIDE 0.9% FLUSH: 3.0000 mL | INTRAVENOUS | Status: DC | PRN

## 2020-04-03 MED ORDER — PROPOFOL 10 MG/ML IV BOLUS
INTRAVENOUS | Status: AC
  Filled 2020-04-03: qty 20

## 2020-04-03 MED ORDER — METHOCARBAMOL 500 MG PO TABS
ORAL_TABLET | ORAL | Status: AC
  Administered 2020-04-03: 500 mg
  Filled 2020-04-03: qty 1

## 2020-04-03 MED ORDER — ROCURONIUM BROMIDE 10 MG/ML (PF) SYRINGE
PREFILLED_SYRINGE | INTRAVENOUS | Status: AC
  Filled 2020-04-03: qty 10

## 2020-04-03 MED ORDER — ACETAMINOPHEN 10 MG/ML IV SOLN
INTRAVENOUS | Status: AC
  Filled 2020-04-03: qty 100

## 2020-04-03 MED ORDER — PROPOFOL 500 MG/50ML IV EMUL
INTRAVENOUS | Status: DC | PRN
  Administered 2020-04-03: 50 ug/kg/min via INTRAVENOUS

## 2020-04-03 MED ORDER — LIDOCAINE 2% (20 MG/ML) 5 ML SYRINGE
INTRAMUSCULAR | Status: AC
  Filled 2020-04-03: qty 5

## 2020-04-03 MED ORDER — BUPIVACAINE-EPINEPHRINE 0.25% -1:200000 IJ SOLN
INTRAMUSCULAR | Status: DC | PRN
  Administered 2020-04-03: 10 mL

## 2020-04-03 MED ORDER — OXYCODONE HCL 5 MG PO TABS: 5.0000 mg | ORAL_TABLET | ORAL | Status: DC | PRN

## 2020-04-03 MED ORDER — FENTANYL CITRATE (PF) 250 MCG/5ML IJ SOLN
INTRAMUSCULAR | Status: AC
  Filled 2020-04-03: qty 5

## 2020-04-03 MED ORDER — PROMETHAZINE HCL 25 MG/ML IJ SOLN: 6.2500 mg | INTRAMUSCULAR | Status: DC | PRN

## 2020-04-03 MED ORDER — SUCCINYLCHOLINE CHLORIDE 200 MG/10ML IV SOSY
PREFILLED_SYRINGE | INTRAVENOUS | Status: DC | PRN
  Administered 2020-04-03: 120 mg via INTRAVENOUS

## 2020-04-03 MED ORDER — OXYCODONE HCL 5 MG PO TABS
10.0000 mg | ORAL_TABLET | ORAL | Status: DC | PRN
  Administered 2020-04-03 – 2020-04-04 (×5): 10 mg via ORAL
  Filled 2020-04-03 (×9): qty 2

## 2020-04-03 MED ORDER — ONDANSETRON HCL 4 MG/2ML IJ SOLN: 4.0000 mg | Freq: Four times a day (QID) | INTRAMUSCULAR | Status: DC | PRN

## 2020-04-03 MED ORDER — MIDAZOLAM HCL 2 MG/2ML IJ SOLN
INTRAMUSCULAR | Status: AC
  Filled 2020-04-03: qty 2

## 2020-04-03 MED ORDER — ONDANSETRON HCL 4 MG PO TABS: 4.0000 mg | ORAL_TABLET | Freq: Four times a day (QID) | ORAL | Status: DC | PRN

## 2020-04-03 MED ORDER — ONDANSETRON HCL 4 MG/2ML IJ SOLN
INTRAMUSCULAR | Status: DC | PRN
  Administered 2020-04-03: 4 mg via INTRAVENOUS

## 2020-04-03 MED ORDER — MIDAZOLAM HCL 2 MG/2ML IJ SOLN
INTRAMUSCULAR | Status: DC | PRN
  Administered 2020-04-03 (×2): 1 mg via INTRAVENOUS

## 2020-04-03 MED ORDER — METHOCARBAMOL 1000 MG/10ML IJ SOLN
500.0000 mg | Freq: Four times a day (QID) | INTRAVENOUS | Status: DC | PRN
  Filled 2020-04-03: qty 5

## 2020-04-03 MED ORDER — ESMOLOL HCL 100 MG/10ML IV SOLN
INTRAVENOUS | Status: AC
  Filled 2020-04-03: qty 10

## 2020-04-03 MED ORDER — HYDROMORPHONE HCL 1 MG/ML IJ SOLN
INTRAMUSCULAR | Status: AC
Start: 2020-04-03 — End: 2020-04-04
  Filled 2020-04-03: qty 1

## 2020-04-03 MED ORDER — HYDROMORPHONE HCL 1 MG/ML IJ SOLN
0.2500 mg | INTRAMUSCULAR | Status: DC | PRN
  Administered 2020-04-03: 0.5 mg via INTRAVENOUS

## 2020-04-03 MED ORDER — PROPOFOL 10 MG/ML IV BOLUS
INTRAVENOUS | Status: DC | PRN
  Administered 2020-04-03 (×2): 20 mg via INTRAVENOUS
  Administered 2020-04-03: 150 mg via INTRAVENOUS

## 2020-04-03 MED ORDER — MENTHOL 3 MG MT LOZG: 1.0000 | LOZENGE | OROMUCOSAL | Status: DC | PRN

## 2020-04-03 MED ORDER — LIDOCAINE 2% (20 MG/ML) 5 ML SYRINGE
INTRAMUSCULAR | Status: DC | PRN
  Administered 2020-04-03: 80 mg via INTRAVENOUS

## 2020-04-03 MED ORDER — DEXAMETHASONE SODIUM PHOSPHATE 10 MG/ML IJ SOLN
INTRAMUSCULAR | Status: DC | PRN
  Administered 2020-04-03: 10 mg via INTRAVENOUS

## 2020-04-03 MED ORDER — FENTANYL CITRATE (PF) 250 MCG/5ML IJ SOLN
INTRAMUSCULAR | Status: DC | PRN
Start: 2020-04-03 — End: 2020-04-03
  Administered 2020-04-03 (×2): 50 ug via INTRAVENOUS
  Administered 2020-04-03: 100 ug via INTRAVENOUS
  Administered 2020-04-03 (×6): 50 ug via INTRAVENOUS

## 2020-04-03 MED ORDER — 0.9 % SODIUM CHLORIDE (POUR BTL) OPTIME
TOPICAL | Status: DC | PRN
  Administered 2020-04-03 (×2): 1000 mL

## 2020-04-03 MED ORDER — BUPIVACAINE HCL (PF) 0.25 % IJ SOLN
INTRAMUSCULAR | Status: AC
  Filled 2020-04-03: qty 30

## 2020-04-03 MED ORDER — ACETAMINOPHEN 325 MG PO TABS
650.0000 mg | ORAL_TABLET | ORAL | Status: DC | PRN
  Administered 2020-04-03 – 2020-04-04 (×2): 650 mg via ORAL
  Filled 2020-04-03 (×2): qty 2

## 2020-04-03 MED ORDER — CEFAZOLIN SODIUM-DEXTROSE 1-4 GM/50ML-% IV SOLN
1.0000 g | Freq: Three times a day (TID) | INTRAVENOUS | Status: AC
  Administered 2020-04-03 – 2020-04-04 (×2): 1 g via INTRAVENOUS
  Filled 2020-04-03 (×2): qty 50

## 2020-04-03 MED ORDER — DEXAMETHASONE SODIUM PHOSPHATE 10 MG/ML IJ SOLN
INTRAMUSCULAR | Status: AC
  Filled 2020-04-03: qty 1

## 2020-04-03 MED ORDER — THROMBIN 20000 UNITS EX SOLR
CUTANEOUS | Status: DC | PRN
  Administered 2020-04-03: 20 mL via TOPICAL

## 2020-04-03 MED ORDER — ONDANSETRON HCL 4 MG/2ML IJ SOLN
INTRAMUSCULAR | Status: AC
  Filled 2020-04-03: qty 2

## 2020-04-03 MED ORDER — METHOCARBAMOL 500 MG PO TABS
500.0000 mg | ORAL_TABLET | Freq: Four times a day (QID) | ORAL | Status: DC | PRN
  Administered 2020-04-03 – 2020-04-04 (×3): 500 mg via ORAL
  Filled 2020-04-03 (×5): qty 1

## 2020-04-03 MED ORDER — CHLORHEXIDINE GLUCONATE 0.12 % MT SOLN
15.0000 mL | Freq: Once | OROMUCOSAL | Status: AC
  Administered 2020-04-03: 15 mL via OROMUCOSAL
  Filled 2020-04-03: qty 15

## 2020-04-03 MED ORDER — POLYETHYLENE GLYCOL 3350 17 G PO PACK: 17.0000 g | PACK | Freq: Every day | ORAL | Status: DC | PRN

## 2020-04-03 MED ORDER — THROMBIN (RECOMBINANT) 20000 UNITS EX SOLR
CUTANEOUS | Status: AC
  Filled 2020-04-03: qty 20000

## 2020-04-03 MED ORDER — ACETAMINOPHEN 650 MG RE SUPP: 650.0000 mg | RECTAL | Status: DC | PRN

## 2020-04-03 MED ORDER — TRANEXAMIC ACID-NACL 1000-0.7 MG/100ML-% IV SOLN
INTRAVENOUS | Status: DC | PRN
  Administered 2020-04-03: 1000 mg via INTRAVENOUS

## 2020-04-03 MED ORDER — EPINEPHRINE PF 1 MG/ML IJ SOLN
INTRAMUSCULAR | Status: AC
  Filled 2020-04-03: qty 1

## 2020-04-03 MED ORDER — SODIUM CHLORIDE 0.9% FLUSH: 3.0000 mL | Freq: Two times a day (BID) | INTRAVENOUS | Status: DC

## 2020-04-03 MED ORDER — DOCUSATE SODIUM 100 MG PO CAPS
100.0000 mg | ORAL_CAPSULE | Freq: Two times a day (BID) | ORAL | Status: DC
  Administered 2020-04-03: 100 mg via ORAL
  Filled 2020-04-03 (×3): qty 1

## 2020-04-03 MED ORDER — PREDNISONE 1 MG PO TABS
4.0000 mg | ORAL_TABLET | Freq: Every day | ORAL | Status: DC
  Administered 2020-04-05: 4 mg via ORAL
  Filled 2020-04-03 (×2): qty 4

## 2020-04-03 MED ORDER — AMISULPRIDE (ANTIEMETIC) 5 MG/2ML IV SOLN: 10.0000 mg | Freq: Once | INTRAVENOUS | Status: DC | PRN

## 2020-04-03 MED ORDER — EPHEDRINE SULFATE-NACL 50-0.9 MG/10ML-% IV SOSY
PREFILLED_SYRINGE | INTRAVENOUS | Status: DC | PRN
  Administered 2020-04-03: 5 mg via INTRAVENOUS
  Administered 2020-04-03: 10 mg via INTRAVENOUS

## 2020-04-03 MED ORDER — HYDROMORPHONE HCL 1 MG/ML IJ SOLN
0.5000 mg | INTRAMUSCULAR | Status: DC | PRN
  Administered 2020-04-03: 0.5 mg via INTRAVENOUS
  Filled 2020-04-03: qty 0.5

## 2020-04-03 MED ORDER — PHENOL 1.4 % MT LIQD: 1.0000 | OROMUCOSAL | Status: DC | PRN

## 2020-04-03 MED ORDER — TRANEXAMIC ACID-NACL 1000-0.7 MG/100ML-% IV SOLN
INTRAVENOUS | Status: AC
  Filled 2020-04-03: qty 100

## 2020-04-03 SURGICAL SUPPLY — 86 items
ADH SKN CLS APL DERMABOND .7 (GAUZE/BANDAGES/DRESSINGS) ×1
AGENT HMST KT MTR STRL THRMB (HEMOSTASIS) ×2
ALLOGRAFT LORDOTIC CC 7X11X14 (Bone Implant) ×8 IMPLANT
BAND INSRT 18 STRL LF DISP RB (MISCELLANEOUS)
BAND RUBBER #18 3X1/16 STRL (MISCELLANEOUS) IMPLANT
BLADE CLIPPER SURG (BLADE) IMPLANT
BUR EGG ELITE 4.0 (BURR) IMPLANT
BUR EGG ELITE 4.0MM (BURR)
BUR MATCHSTICK NEURO 3.0 LAGG (BURR) IMPLANT
CABLE BIPOLOR RESECTION CORD (MISCELLANEOUS) ×3 IMPLANT
CANISTER SUCT 3000ML PPV (MISCELLANEOUS) ×3 IMPLANT
CLOSURE STERI-STRIP 1/2X4 (GAUZE/BANDAGES/DRESSINGS) ×1
CLSR STERI-STRIP ANTIMIC 1/2X4 (GAUZE/BANDAGES/DRESSINGS) ×2 IMPLANT
COLLAR CERV LO CONTOUR FIRM DE (SOFTGOODS) IMPLANT
COVER MAYO STAND STRL (DRAPES) ×9 IMPLANT
COVER SURGICAL LIGHT HANDLE (MISCELLANEOUS) ×6 IMPLANT
COVER WAND RF STERILE (DRAPES) ×3 IMPLANT
DERMABOND ADVANCED (GAUZE/BANDAGES/DRESSINGS) ×2
DERMABOND ADVANCED .7 DNX12 (GAUZE/BANDAGES/DRESSINGS) ×1 IMPLANT
DRAIN TLS ROUND 10FR (DRAIN) ×2 IMPLANT
DRAPE C-ARM 42X72 X-RAY (DRAPES) ×5 IMPLANT
DRAPE MICROSCOPE LEICA 46X105 (MISCELLANEOUS) IMPLANT
DRAPE POUCH INSTRU U-SHP 10X18 (DRAPES) ×3 IMPLANT
DRAPE SURG 17X23 STRL (DRAPES) ×3 IMPLANT
DRAPE U-SHAPE 47X51 STRL (DRAPES) ×3 IMPLANT
DRSG OPSITE POSTOP 4X6 (GAUZE/BANDAGES/DRESSINGS) ×3 IMPLANT
DURAPREP 26ML APPLICATOR (WOUND CARE) ×3 IMPLANT
ELECT COATED BLADE 2.86 ST (ELECTRODE) ×3 IMPLANT
ELECT PENCIL ROCKER SW 15FT (MISCELLANEOUS) ×3 IMPLANT
ELECT REM PT RETURN 9FT ADLT (ELECTROSURGICAL) ×3
ELECTRODE REM PT RTRN 9FT ADLT (ELECTROSURGICAL) ×1 IMPLANT
FEE INTRAOP MONITOR IMPULS NCS (MISCELLANEOUS) IMPLANT
GLOVE BIO SURGEON STRL SZ 6.5 (GLOVE) ×2 IMPLANT
GLOVE BIO SURGEONS STRL SZ 6.5 (GLOVE) ×1
GLOVE BIOGEL PI IND STRL 6.5 (GLOVE) ×1 IMPLANT
GLOVE BIOGEL PI IND STRL 8.5 (GLOVE) ×1 IMPLANT
GLOVE BIOGEL PI INDICATOR 6.5 (GLOVE) ×2
GLOVE BIOGEL PI INDICATOR 8.5 (GLOVE) ×2
GLOVE SS BIOGEL STRL SZ 8.5 (GLOVE) ×1 IMPLANT
GLOVE SUPERSENSE BIOGEL SZ 8.5 (GLOVE) ×2
GOWN STRL REUS W/ TWL LRG LVL3 (GOWN DISPOSABLE) ×2 IMPLANT
GOWN STRL REUS W/TWL 2XL LVL3 (GOWN DISPOSABLE) ×6 IMPLANT
GOWN STRL REUS W/TWL LRG LVL3 (GOWN DISPOSABLE) ×3
INTRAOP MONITOR FEE IMPULS NCS (MISCELLANEOUS) ×1
INTRAOP MONITOR FEE IMPULSE (MISCELLANEOUS) ×3
KIT BASIN OR (CUSTOM PROCEDURE TRAY) ×3 IMPLANT
KIT TURNOVER KIT B (KITS) ×3 IMPLANT
NDL SPNL 18GX3.5 QUINCKE PK (NEEDLE) ×1 IMPLANT
NEEDLE HYPO 22GX1.5 SAFETY (NEEDLE) ×3 IMPLANT
NEEDLE SPNL 18GX3.5 QUINCKE PK (NEEDLE) ×3 IMPLANT
NS IRRIG 1000ML POUR BTL (IV SOLUTION) ×3 IMPLANT
PACK ORTHO CERVICAL (CUSTOM PROCEDURE TRAY) ×3 IMPLANT
PACK UNIVERSAL I (CUSTOM PROCEDURE TRAY) ×3 IMPLANT
PAD ARMBOARD 7.5X6 YLW CONV (MISCELLANEOUS) ×6 IMPLANT
PATTIES SURGICAL .25X.25 (GAUZE/BANDAGES/DRESSINGS) IMPLANT
PATTIES SURGICAL .5 X.5 (GAUZE/BANDAGES/DRESSINGS) IMPLANT
PIN DISTRACTION MAXCESS-C 12 ×2 IMPLANT
PLATE ACP 2.1H 70 4LVL (Plate) ×2 IMPLANT
POSITIONER HEAD DONUT 9IN (MISCELLANEOUS) ×3 IMPLANT
PUTTY DBX 1CC (Putty) ×3 IMPLANT
PUTTY DBX 1CC DEPUY (Putty) IMPLANT
SCREW ACP 3.5 X 13 S/D VARIA (Screw) ×3 IMPLANT
SCREW ACP 3.5X13 S/D VAR ANGLE (Screw) IMPLANT
SCREW ACP VA SD 3.5X15 (Screw) ×2 IMPLANT
SCREW ACP VA ST 4X13 (Screw) ×2 IMPLANT
SPONGE INTESTINAL PEANUT (DISPOSABLE) ×3 IMPLANT
SPONGE LAP 4X18 RFD (DISPOSABLE) ×2 IMPLANT
SPONGE SURGIFOAM ABS GEL 100 (HEMOSTASIS) ×3 IMPLANT
SURGIFLO W/THROMBIN 8M KIT (HEMOSTASIS) ×4 IMPLANT
SUT BONE WAX W31G (SUTURE) ×3 IMPLANT
SUT MNCRL AB 3-0 PS2 27 (SUTURE) ×3 IMPLANT
SUT SILK 2 0 (SUTURE) ×3
SUT SILK 2-0 18XBRD TIE 12 (SUTURE) ×1 IMPLANT
SUT VIC AB 2-0 CT1 18 (SUTURE) ×3 IMPLANT
SUT VIC AB 3-0 54X BRD REEL (SUTURE) IMPLANT
SUT VIC AB 3-0 BRD 54 (SUTURE)
SYR BULB IRRIG 60ML STRL (SYRINGE) ×3 IMPLANT
SYR CONTROL 10ML LL (SYRINGE) ×3 IMPLANT
SYSTEM CHEST DRAIN TLS 7FR (DRAIN) ×2 IMPLANT
TAPE CLOTH 4X10 WHT NS (GAUZE/BANDAGES/DRESSINGS) ×3 IMPLANT
TAPE UMBILICAL 1/8 X36 TWILL (MISCELLANEOUS) ×4 IMPLANT
TAPE UMBILICAL COTTON 1/8X30 (MISCELLANEOUS) ×3 IMPLANT
TOWEL GREEN STERILE (TOWEL DISPOSABLE) ×3 IMPLANT
TOWEL GREEN STERILE FF (TOWEL DISPOSABLE) ×3 IMPLANT
TRAY FOLEY MTR SLVR 16FR STAT (SET/KITS/TRAYS/PACK) ×2 IMPLANT
WATER STERILE IRR 1000ML POUR (IV SOLUTION) ×3 IMPLANT

## 2020-04-03 NOTE — Brief Op Note (Signed)
04/03/2020  2:08 PM  PATIENT:  Debra Farmer  74 y.o. female  PRE-OPERATIVE DIAGNOSIS:  Cervical spndylotic meyloradiculopathy  POST-OPERATIVE DIAGNOSIS:  Cervical spndylotic meyloradiculopathy  PROCEDURE:  Procedure(s) with comments: ANTERIOR CERVICAL DECOMPRESSION/DISCECTOMY FUSION C3-7 (N/A) - 5 hrs  SURGEON:  Surgeon(s) and Role:    Melina Schools, MD - Primary  PHYSICIAN ASSISTANT:   ASSISTANTS: Amanda Ward, PA   ANESTHESIA:   general  EBL:  150 mL   BLOOD ADMINISTERED:none  DRAINS: (1) TLS Drain(s) to suction in the neck   LOCAL MEDICATIONS USED:  MARCAINE     SPECIMEN:  No Specimen  DISPOSITION OF SPECIMEN:  N/A  COUNTS:  YES  TOURNIQUET:  * No tourniquets in log *  DICTATION: .Dragon Dictation  PLAN OF CARE: Admit to inpatient   PATIENT DISPOSITION:  PACU - hemodynamically stable.

## 2020-04-03 NOTE — H&P (Signed)
Addendum H&P.  There has been no change in the patient's clinical exam since her last office visit of 03/26/2020.  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  I have gone over the 2-day surgical procedure with her and she is expressed an understanding of the surgery as well as the risks and benefits.  She is also expressed her willingness to move forward with surgery.  All of her questions were encouraged and addressed.

## 2020-04-03 NOTE — Transfer of Care (Signed)
Immediate Anesthesia Transfer of Care Note  Patient: Debra Farmer  Procedure(s) Performed: ANTERIOR CERVICAL DECOMPRESSION/DISCECTOMY FUSION C3-7 (N/A )  Patient Location: PACU  Anesthesia Type:General  Level of Consciousness: drowsy and patient cooperative  Airway & Oxygen Therapy: Patient Spontanous Breathing and Patient connected to face mask oxygen  Post-op Assessment: Report given to RN and Post -op Vital signs reviewed and stable  Post vital signs: Reviewed and stable  Last Vitals:  Vitals Value Taken Time  BP 159/85 04/03/20 1419  Temp    Pulse 94 04/03/20 1421  Resp 17 04/03/20 1421  SpO2 97 % 04/03/20 1421  Vitals shown include unvalidated device data.  Last Pain:  Vitals:   04/03/20 0656  TempSrc:   PainSc: 8       Patients Stated Pain Goal: 3 (09/62/83 6629)  Complications: No complications documented.

## 2020-04-03 NOTE — Anesthesia Procedure Notes (Signed)
Arterial Line Insertion Start/End10/20/2021 7:00 AM, 04/03/2020 7:10 AM Performed by: Lance Coon, CRNA, CRNA  Patient location: Pre-op. Preanesthetic checklist: patient identified, IV checked, site marked, risks and benefits discussed, surgical consent, monitors and equipment checked, pre-op evaluation, timeout performed and anesthesia consent Lidocaine 1% used for infiltration Left, radial was placed Catheter size: 20 G Hand hygiene performed , maximum sterile barriers used  and Seldinger technique used  Attempts: 2 Procedure performed without using ultrasound guided technique. Following insertion, dressing applied and Biopatch. Post procedure assessment: normal and unchanged  Patient tolerated the procedure well with no immediate complications.

## 2020-04-03 NOTE — Op Note (Signed)
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  Postoperative diagnosis: Same  Operative procedure: Anterior cervical discectomy and fusion C3-7.  Complications: None  First Assistant: Cleta Alberts, PA.  EBL: 150 cc  Neuro monitoring: No abnormal free running EMG, SSEP, or evoked motor potential activity noted.  No significant change from baseline at the conclusion of the case.  Implants: NuVasive triad CC cortical cancellous allograft spacer.  7 mm small implant at each level.  (7 x 11 x 14 mm, lordotic spacer).  NuVasive anterior cervical plate.  70 mm length plate affixed with appropriate locking screws.  Condition: Stable  Indications: Debra Farmer is a very pleasant 74 year old man who presented to my office with severe neck pain, radicular left arm pain, and difficulty maintaining her balance and fine motor control.  Imaging studies demonstrated cord signal change with severe multilevel foraminal stenosis as well as degenerative disc disease and loss in normal cervical lordosis.  Her clinical exam is also consistent with cervical spondylitic myeloradiculopathy.  As a result of the spinal cord compression and her severe debilitating pain we elected to move forward with surgery.  Surgical plan was an anterior cervical discectomy and fusion C3-7, and then posterior supplemental lateral mass screw fixation and possible left C3 laminotomy to resect a synovial cyst.  This was the planned first day procedure.  All appropriate risks benefits and alternatives to surgery were discussed with the patient and consent was obtained.  Operative report: Patient was brought the operating room placed upon the operating room table.  After successful induction of general anesthesia and endotracheal intubation, teds SCDs and a Foley were inserted.  The neuro monitoring representative then placed all appropriate needles and pads for SSEP, EMG, and evoked motor potential  monitoring.  The anterior cervical spine was then prepped and draped in a standard fashion.  Timeout was taken to confirm patient procedure and all other important data.  Using lateral fluoroscopy I marked out the C3-4 and the C6-7 disc space levels.  I marked out the incision site and infiltrated with quarter percent Marcaine.  Given the multiple levels I elected to use a standard longitudinal Smith-Robinson approach to the cervical spine.  A longitudinal incision made along the medial border the sternocleidomastoid and sharply dissected down to and through the platysma.  Identified the omohyoid muscle isolated and sacrificed it for better visualization.  I continued to dissect into the deep cervical fascia until I was able to mobilize the esophagus and trachea and protected with a hand-held retractor.  Identified the carotid artery and protected this with my finger.  At this point I was through the deep cervical and prevertebral fascia to expose the anterior longitudinal ligament.  I placed the needle into the C3-4 disc space and took a lateral x-ray to confirm I was at the appropriate level.  Once this was confirmed I mobilized the longus coli muscle using bipolar electrocautery from the superior aspect of C3 to the inferior aspect of C7.  This was done bilaterally.  At this point I placed my self-retaining Caspar retractor blades underneath the longus coli muscle and deflated the endotracheal cuff and expanded the retractor to the appropriate width and reinflated the endotracheal cuff.  The C3-4 disc was isolated and an annulotomy was performed with a 15 blade scalpel pituitary was used to resect the bulk of the disc material.  Distraction pins were placed into the body of C3 and C4 and I distracted the intervertebral space and maintained it with the  distraction pin set.  The overhanging osteophyte from the inferior aspect of C3 was resected with a 2 mm Kerrison rongeur and I continue to remove all the disc  material using a curette once I was at the posterior annulus I used my 1 mm Kerrison rongeur to resect the posterior osteophyte from the C3 and C4 vertebral body.  A fine nerve hook was used to gently dissect through the posterior annulus as well as the posterior longitudinal ligament to create a plane between the thecal sac and the PLL.  Once this was done I was able to use my 1 mm Kerrison rongeur to resect the PLL and posterior annulus.  Once this was done I was able to undercut the uncovertebral joint bilaterally to aid in the foraminal decompression.  At this point I could freely pass my nerve hook behind the vertebral body of C3 and C4.  I also could visualize the anterior thecal sac confirming an adequate decompression.  This was the area of spinal cord compression and myelomalacia on the MRI.  The hard disc osteophyte responsible for the cord compression were resected and I felt as though he had an adequate decompression.  At this point I used the trial rasps and elected use the 7 mm small implant.  The cortical cancellous allograft spacer was then malleted to the appropriate depth.  There is excellent purchase in the well seated in the disc space.  The distraction pin was now removed from the C3 vertebral body and in the bleeding bone was sealed with bone wax.  The retracting system was then relocated to expose the C6-C7 disc space.  Once the retractors were properly placed an annulotomy was performed at this level.  Using the same technique I used at C3-4 I would performed a discectomy at C6-7.  Again the overhanging osteophyte from the inferior aspect of C6 was taken down and curettes and pituitary rongeurs were used to remove all of the disc material.  1 mm Kerrison rongeur was used to take down the posterior osteophyte from the C7 and C6 vertebral bodies.  I then gently dissected through the PLL and posterior annulus with my nerve hook until I could generate a plane within the thecal sac and the PLL.   PLL was then resected with a 1 mm Kerrison rongeur.  I then undercut the uncovertebral joints.  At this point I was able to pass my nerve hook behind the vertebral bodies and under the uncovertebral joint without any significant resistance.  I again used the rasp trial and placed the 7 mm small cortical cancellous graft.  At this point I then reposition the retractor so I could expose the C4-5 and C5-6 disc space.  Using the same technique that I utilized prior I performed a discectomy at these 2 levels.  Again the PLL was taken down and the hard disc osteophytes were taken down with a 1 mm Kerrison to adequately decompress the thecal sac and exiting nerve root.  Once the decompression/discectomy was complete I confirmed that I could adequately and freely pass my nerve hook underneath the vertebral body and under the uncovertebral joints bilaterally to confirm an adequate decompression.  I then used the rasp trial and placed the 7 mm small lordotic spacer.  At this point time all 4 levels were complete.  Imaging studies demonstrated improved sagittal alignment as well as satisfactory positioning of the intervertebral spacers.  I then obtained a 70 mm anterior cervical plate and gently bent it  to assume the improved cervical lordosis.  I then secured it to the body of C3 with 13 mm rescue screws, and 15 mm locking screws into the body of C7.  I then used 13 mm locking screws at the bodies of C4-C5 and C6.  All screws had excellent purchase.  At this point I copiously irrigated the wound and then made sure to lock the screws in place as per manufacture standards.  Once all the locks were secured I did a final irrigation.  I confirmed the trach and esophagus or not trephinate the plate and returned it back to midline.  A drain was placed and taken out through a separate stab incision.  The platysma was then closed with interrupted 2-0 Vicryl suture and the skin with a 3-0 Monocryl.  Steri-Strips and a dry dressing  were applied and the patient was ultimately extubated and transferred to the PACU without incident.  The end of the case all needle and sponge counts were correct.  There were no adverse intraoperative events.

## 2020-04-03 NOTE — Anesthesia Procedure Notes (Signed)
Procedure Name: Intubation Date/Time: 04/03/2020 8:41 AM Performed by: Lance Coon, CRNA Pre-anesthesia Checklist: Patient identified, Emergency Drugs available, Patient being monitored and Timeout performed Patient Re-evaluated:Patient Re-evaluated prior to induction Oxygen Delivery Method: Circle system utilized Preoxygenation: Pre-oxygenation with 100% oxygen Induction Type: IV induction Ventilation: Mask ventilation without difficulty Laryngoscope Size: Miller and 2 Grade View: Grade I Tube type: Oral Tube size: 7.0 mm Number of attempts: 1 Airway Equipment and Method: Stylet Placement Confirmation: ETT inserted through vocal cords under direct vision,  positive ETCO2 and breath sounds checked- equal and bilateral Secured at: 21 cm Tube secured with: Tape Dental Injury: Teeth and Oropharynx as per pre-operative assessment

## 2020-04-03 NOTE — Anesthesia Postprocedure Evaluation (Signed)
Anesthesia Post Note  Patient: Debra Farmer  Procedure(s) Performed: ANTERIOR CERVICAL DECOMPRESSION/DISCECTOMY FUSION C3-7 (N/A )     Patient location during evaluation: PACU Anesthesia Type: General Level of consciousness: sedated Pain management: pain level controlled Vital Signs Assessment: post-procedure vital signs reviewed and stable Respiratory status: spontaneous breathing and respiratory function stable Cardiovascular status: stable Postop Assessment: no apparent nausea or vomiting Anesthetic complications: no   No complications documented.  Last Vitals:  Vitals:   04/03/20 1717 04/03/20 1949  BP: (!) 165/78 (!) 149/73  Pulse: 76 96  Resp: 17 18  Temp: 36.5 C 36.7 C  SpO2: 99% 98%    Last Pain:  Vitals:   04/03/20 1949  TempSrc: Oral  PainSc:                  Merlinda Frederick

## 2020-04-04 ENCOUNTER — Encounter (HOSPITAL_COMMUNITY): Payer: Self-pay | Admitting: Orthopedic Surgery

## 2020-04-04 ENCOUNTER — Inpatient Hospital Stay (HOSPITAL_COMMUNITY): Admission: RE | Admit: 2020-04-04 | Payer: 59 | Source: Home / Self Care | Admitting: Orthopedic Surgery

## 2020-04-04 ENCOUNTER — Inpatient Hospital Stay (HOSPITAL_COMMUNITY): Payer: 59

## 2020-04-04 ENCOUNTER — Inpatient Hospital Stay (HOSPITAL_COMMUNITY)
Admission: RE | Disposition: A | Discharge status: Home Health | Payer: Self-pay | Source: Home / Self Care | Attending: Orthopedic Surgery

## 2020-04-04 HISTORY — PX: POSTERIOR CERVICAL FUSION/FORAMINOTOMY: SHX5038

## 2020-04-04 SURGERY — POSTERIOR CERVICAL FUSION/FORAMINOTOMY LEVEL 4
Anesthesia: General | Site: Spine Cervical

## 2020-04-04 MED ORDER — LACTATED RINGERS IV SOLN: INTRAVENOUS | Status: DC

## 2020-04-04 MED ORDER — HYDROMORPHONE HCL 1 MG/ML IJ SOLN
INTRAMUSCULAR | Status: AC
  Administered 2020-04-04: 0.5 mg via INTRAVENOUS
  Filled 2020-04-04: qty 1

## 2020-04-04 MED ORDER — ONDANSETRON HCL 4 MG/2ML IJ SOLN
INTRAMUSCULAR | Status: DC | PRN
  Administered 2020-04-04: 4 mg via INTRAVENOUS

## 2020-04-04 MED ORDER — ESMOLOL HCL 100 MG/10ML IV SOLN
INTRAVENOUS | Status: DC | PRN
  Administered 2020-04-04: 30 mg via INTRAVENOUS

## 2020-04-04 MED ORDER — LACTATED RINGERS IV SOLN: INTRAVENOUS | Status: DC | PRN

## 2020-04-04 MED ORDER — THROMBIN (RECOMBINANT) 20000 UNITS EX SOLR
CUTANEOUS | Status: AC
  Filled 2020-04-04: qty 20000

## 2020-04-04 MED ORDER — EPHEDRINE 5 MG/ML INJ
INTRAVENOUS | Status: AC
  Filled 2020-04-04: qty 10

## 2020-04-04 MED ORDER — PROPOFOL 10 MG/ML IV BOLUS
INTRAVENOUS | Status: AC
  Filled 2020-04-04: qty 20

## 2020-04-04 MED ORDER — PROPOFOL 10 MG/ML IV BOLUS
INTRAVENOUS | Status: DC | PRN
  Administered 2020-04-04: 150 mg via INTRAVENOUS

## 2020-04-04 MED ORDER — OXYCODONE HCL 5 MG PO TABS
10.0000 mg | ORAL_TABLET | ORAL | Status: DC | PRN
  Administered 2020-04-04 – 2020-04-05 (×3): 10 mg via ORAL

## 2020-04-04 MED ORDER — PHENOL 1.4 % MT LIQD: 1.0000 | OROMUCOSAL | Status: DC | PRN

## 2020-04-04 MED ORDER — CEFAZOLIN SODIUM-DEXTROSE 1-4 GM/50ML-% IV SOLN
1.0000 g | Freq: Three times a day (TID) | INTRAVENOUS | Status: AC
  Administered 2020-04-04 – 2020-04-05 (×2): 1 g via INTRAVENOUS
  Filled 2020-04-04: qty 50

## 2020-04-04 MED ORDER — SUGAMMADEX SODIUM 200 MG/2ML IV SOLN
INTRAVENOUS | Status: DC | PRN
  Administered 2020-04-04: 250 mg via INTRAVENOUS

## 2020-04-04 MED ORDER — BACITRACIN ZINC 500 UNIT/GM EX OINT
TOPICAL_OINTMENT | CUTANEOUS | Status: DC | PRN
  Administered 2020-04-04: 1 via TOPICAL

## 2020-04-04 MED ORDER — CEFAZOLIN SODIUM-DEXTROSE 2-4 GM/100ML-% IV SOLN
2.0000 g | INTRAVENOUS | Status: AC
  Administered 2020-04-04: 2 g via INTRAVENOUS

## 2020-04-04 MED ORDER — DOCUSATE SODIUM 100 MG PO CAPS
100.0000 mg | ORAL_CAPSULE | Freq: Two times a day (BID) | ORAL | Status: DC
  Administered 2020-04-05: 100 mg via ORAL
  Filled 2020-04-04: qty 1

## 2020-04-04 MED ORDER — ORAL CARE MOUTH RINSE: 15.0000 mL | Freq: Once | OROMUCOSAL | Status: AC

## 2020-04-04 MED ORDER — FENTANYL CITRATE (PF) 250 MCG/5ML IJ SOLN
INTRAMUSCULAR | Status: AC
  Filled 2020-04-04: qty 5

## 2020-04-04 MED ORDER — SODIUM CHLORIDE 0.9 % IV SOLN: 250.0000 mL | INTRAVENOUS | Status: DC

## 2020-04-04 MED ORDER — SODIUM CHLORIDE 0.9% FLUSH: 3.0000 mL | INTRAVENOUS | Status: DC | PRN

## 2020-04-04 MED ORDER — MENTHOL 3 MG MT LOZG: 1.0000 | LOZENGE | OROMUCOSAL | Status: DC | PRN

## 2020-04-04 MED ORDER — ACETAMINOPHEN 325 MG PO TABS
650.0000 mg | ORAL_TABLET | ORAL | Status: DC | PRN
  Administered 2020-04-04 – 2020-04-05 (×2): 650 mg via ORAL
  Filled 2020-04-04 (×2): qty 2

## 2020-04-04 MED ORDER — BUPIVACAINE LIPOSOME 1.3 % IJ SUSP
20.0000 mL | Freq: Once | INTRAMUSCULAR | Status: AC
  Administered 2020-04-04: 20 mL
  Filled 2020-04-04 (×2): qty 20

## 2020-04-04 MED ORDER — POLYETHYLENE GLYCOL 3350 17 G PO PACK: 17.0000 g | PACK | Freq: Every day | ORAL | Status: DC | PRN

## 2020-04-04 MED ORDER — BUPIVACAINE-EPINEPHRINE 0.25% -1:200000 IJ SOLN
INTRAMUSCULAR | Status: DC | PRN
  Administered 2020-04-04: 10 mL
  Administered 2020-04-04: 29 mL

## 2020-04-04 MED ORDER — BUPIVACAINE-EPINEPHRINE (PF) 0.25% -1:200000 IJ SOLN
INTRAMUSCULAR | Status: AC
  Filled 2020-04-04: qty 30

## 2020-04-04 MED ORDER — PHENYLEPHRINE HCL-NACL 10-0.9 MG/250ML-% IV SOLN
INTRAVENOUS | Status: DC | PRN
  Administered 2020-04-04: 20 ug/min via INTRAVENOUS

## 2020-04-04 MED ORDER — 0.9 % SODIUM CHLORIDE (POUR BTL) OPTIME
TOPICAL | Status: DC | PRN
  Administered 2020-04-04: 1000 mL

## 2020-04-04 MED ORDER — HYDROMORPHONE HCL 1 MG/ML IJ SOLN: 0.2500 mg | INTRAMUSCULAR | Status: DC | PRN

## 2020-04-04 MED ORDER — BACITRACIN ZINC 500 UNIT/GM EX OINT
TOPICAL_OINTMENT | CUTANEOUS | Status: AC
  Filled 2020-04-04: qty 28.35

## 2020-04-04 MED ORDER — FENTANYL CITRATE (PF) 100 MCG/2ML IJ SOLN
INTRAMUSCULAR | Status: DC | PRN
  Administered 2020-04-04 (×2): 100 ug via INTRAVENOUS
  Administered 2020-04-04 (×3): 50 ug via INTRAVENOUS
  Administered 2020-04-04: 25 ug via INTRAVENOUS
  Administered 2020-04-04: 50 ug via INTRAVENOUS
  Administered 2020-04-04: 25 ug via INTRAVENOUS

## 2020-04-04 MED ORDER — PHENYLEPHRINE HCL (PRESSORS) 10 MG/ML IV SOLN
INTRAVENOUS | Status: DC | PRN
  Administered 2020-04-04 (×2): 80 ug via INTRAVENOUS

## 2020-04-04 MED ORDER — ACETAMINOPHEN 650 MG RE SUPP: 650.0000 mg | RECTAL | Status: DC | PRN

## 2020-04-04 MED ORDER — METHOCARBAMOL 1000 MG/10ML IJ SOLN
500.0000 mg | Freq: Four times a day (QID) | INTRAVENOUS | Status: DC | PRN
  Filled 2020-04-04: qty 5

## 2020-04-04 MED ORDER — FLEET ENEMA 7-19 GM/118ML RE ENEM: 1.0000 | ENEMA | Freq: Once | RECTAL | Status: DC | PRN

## 2020-04-04 MED ORDER — MORPHINE SULFATE (PF) 2 MG/ML IV SOLN: 1.0000 mg | INTRAVENOUS | Status: AC | PRN

## 2020-04-04 MED ORDER — THROMBIN 20000 UNITS EX SOLR
CUTANEOUS | Status: DC | PRN
  Administered 2020-04-04: 20 mL via TOPICAL

## 2020-04-04 MED ORDER — GABAPENTIN 300 MG PO CAPS
300.0000 mg | ORAL_CAPSULE | Freq: Three times a day (TID) | ORAL | Status: DC
  Administered 2020-04-05: 300 mg via ORAL
  Filled 2020-04-04: qty 1

## 2020-04-04 MED ORDER — MIDAZOLAM HCL 2 MG/2ML IJ SOLN
INTRAMUSCULAR | Status: AC
  Filled 2020-04-04: qty 2

## 2020-04-04 MED ORDER — PHENYLEPHRINE 40 MCG/ML (10ML) SYRINGE FOR IV PUSH (FOR BLOOD PRESSURE SUPPORT)
PREFILLED_SYRINGE | INTRAVENOUS | Status: AC
  Filled 2020-04-04: qty 20

## 2020-04-04 MED ORDER — ROCURONIUM BROMIDE 100 MG/10ML IV SOLN
INTRAVENOUS | Status: DC | PRN
  Administered 2020-04-04: 30 mg via INTRAVENOUS
  Administered 2020-04-04: 100 mg via INTRAVENOUS

## 2020-04-04 MED ORDER — METHOCARBAMOL 500 MG PO TABS
500.0000 mg | ORAL_TABLET | Freq: Four times a day (QID) | ORAL | Status: DC | PRN
  Administered 2020-04-04 – 2020-04-05 (×3): 500 mg via ORAL
  Filled 2020-04-04 (×2): qty 1

## 2020-04-04 MED ORDER — CHLORHEXIDINE GLUCONATE 0.12 % MT SOLN
15.0000 mL | Freq: Once | OROMUCOSAL | Status: AC
  Administered 2020-04-04: 15 mL via OROMUCOSAL

## 2020-04-04 MED ORDER — DEXAMETHASONE SODIUM PHOSPHATE 10 MG/ML IJ SOLN
INTRAMUSCULAR | Status: DC | PRN
  Administered 2020-04-04: 10 mg via INTRAVENOUS

## 2020-04-04 MED ORDER — SODIUM CHLORIDE 0.9% FLUSH: 3.0000 mL | Freq: Two times a day (BID) | INTRAVENOUS | Status: DC

## 2020-04-04 MED ORDER — SUCCINYLCHOLINE CHLORIDE 200 MG/10ML IV SOSY
PREFILLED_SYRINGE | INTRAVENOUS | Status: AC
  Filled 2020-04-04: qty 10

## 2020-04-04 MED ORDER — SUCCINYLCHOLINE CHLORIDE 20 MG/ML IJ SOLN
INTRAMUSCULAR | Status: DC | PRN
  Administered 2020-04-04: 160 mg via INTRAVENOUS

## 2020-04-04 MED ORDER — PROMETHAZINE HCL 25 MG/ML IJ SOLN: 6.2500 mg | INTRAMUSCULAR | Status: DC | PRN

## 2020-04-04 MED ORDER — ONDANSETRON HCL 4 MG/2ML IJ SOLN
INTRAMUSCULAR | Status: AC
  Filled 2020-04-04: qty 4

## 2020-04-04 MED ORDER — ONDANSETRON HCL 4 MG PO TABS: 4.0000 mg | ORAL_TABLET | Freq: Four times a day (QID) | ORAL | Status: DC | PRN

## 2020-04-04 MED ORDER — LIDOCAINE HCL (CARDIAC) PF 100 MG/5ML IV SOSY
PREFILLED_SYRINGE | INTRAVENOUS | Status: DC | PRN
  Administered 2020-04-04: 100 mg via INTRAVENOUS

## 2020-04-04 MED ORDER — HEMOSTATIC AGENTS (NO CHARGE) OPTIME
TOPICAL | Status: DC | PRN
  Administered 2020-04-04 (×2): 1

## 2020-04-04 MED ORDER — DEXAMETHASONE SODIUM PHOSPHATE 10 MG/ML IJ SOLN
INTRAMUSCULAR | Status: AC
  Filled 2020-04-04: qty 2

## 2020-04-04 MED ORDER — ONDANSETRON HCL 4 MG/2ML IJ SOLN: 4.0000 mg | Freq: Four times a day (QID) | INTRAMUSCULAR | Status: DC | PRN

## 2020-04-04 MED ORDER — OXYCODONE HCL 5 MG PO TABS: 5.0000 mg | ORAL_TABLET | ORAL | Status: DC | PRN

## 2020-04-04 MED ORDER — ROCURONIUM BROMIDE 10 MG/ML (PF) SYRINGE
PREFILLED_SYRINGE | INTRAVENOUS | Status: AC
  Filled 2020-04-04: qty 30

## 2020-04-04 MED FILL — Thrombin (Recombinant) For Soln 20000 Unit: CUTANEOUS | Qty: 1 | Status: AC

## 2020-04-04 SURGICAL SUPPLY — 95 items
ADH SKN CLS APL DERMABOND .7 (GAUZE/BANDAGES/DRESSINGS) ×1
AGENT HMST KT MTR STRL THRMB (HEMOSTASIS) ×2
BIT DRILL MOUNTAINEER FIX 14 (BIT) ×2
BIT DRILL MOUNTAINEER FIX 14MM (BIT) IMPLANT
BLADE CLIPPER SURG (BLADE) IMPLANT
BUR NEURO DRILL SOFT 3.0X3.8M (BURR) ×2 IMPLANT
BUR ROUND PRECISION 4.0 (BURR) ×1 IMPLANT
BUR ROUND PRECISION 4.0MM (BURR) ×1
CLOSURE STERI-STRIP 1/2X4 (GAUZE/BANDAGES/DRESSINGS) ×1
CLOSURE WOUND 1/2 X4 (GAUZE/BANDAGES/DRESSINGS)
CLSR STERI-STRIP ANTIMIC 1/2X4 (GAUZE/BANDAGES/DRESSINGS) ×1 IMPLANT
CORD BIPOLAR FORCEPS 12FT (ELECTRODE) ×3 IMPLANT
COVER MAYO STAND STRL (DRAPES) ×6 IMPLANT
COVER SURGICAL LIGHT HANDLE (MISCELLANEOUS) ×3 IMPLANT
COVER WAND RF STERILE (DRAPES) ×3 IMPLANT
DERMABOND ADVANCED (GAUZE/BANDAGES/DRESSINGS) ×2
DERMABOND ADVANCED .7 DNX12 (GAUZE/BANDAGES/DRESSINGS) ×1 IMPLANT
DRAPE C-ARM 42X72 X-RAY (DRAPES) ×6 IMPLANT
DRAPE POUCH INSTRU U-SHP 10X18 (DRAPES) ×3 IMPLANT
DRAPE SURG 17X23 STRL (DRAPES) ×3 IMPLANT
DRAPE U-SHAPE 47X51 STRL (DRAPES) ×3 IMPLANT
DRILL BIT MOUNTAINEER FIX 14MM (BIT) ×6
DRSG ADAPTIC 3X8 NADH LF (GAUZE/BANDAGES/DRESSINGS) ×3 IMPLANT
DRSG MEPILEX BORDER 4X8 (GAUZE/BANDAGES/DRESSINGS) ×3 IMPLANT
DRSG OPSITE POSTOP 4X6 (GAUZE/BANDAGES/DRESSINGS) ×2 IMPLANT
DRSG PAD ABDOMINAL 8X10 ST (GAUZE/BANDAGES/DRESSINGS) ×2 IMPLANT
DURAPREP 26ML APPLICATOR (WOUND CARE) ×3 IMPLANT
ELECT BLADE 4.0 EZ CLEAN MEGAD (MISCELLANEOUS)
ELECT BLADE 6.5 EXT (BLADE) ×3 IMPLANT
ELECT CAUTERY BLADE 6.4 (BLADE) ×3 IMPLANT
ELECT PENCIL ROCKER SW 15FT (MISCELLANEOUS) ×3 IMPLANT
ELECT REM PT RETURN 9FT ADLT (ELECTROSURGICAL) ×3
ELECTRODE BLDE 4.0 EZ CLN MEGD (MISCELLANEOUS) IMPLANT
ELECTRODE REM PT RTRN 9FT ADLT (ELECTROSURGICAL) ×1 IMPLANT
EVACUATOR 1/8 PVC DRAIN (DRAIN) IMPLANT
GAUZE SPONGE 4X4 12PLY STRL (GAUZE/BANDAGES/DRESSINGS) ×2 IMPLANT
GLOVE BIO SURGEON STRL SZ 6.5 (GLOVE) ×2 IMPLANT
GLOVE BIO SURGEONS STRL SZ 6.5 (GLOVE) ×1
GLOVE BIOGEL PI IND STRL 6.5 (GLOVE) ×1 IMPLANT
GLOVE BIOGEL PI IND STRL 8.5 (GLOVE) ×1 IMPLANT
GLOVE BIOGEL PI INDICATOR 6.5 (GLOVE) ×2
GLOVE BIOGEL PI INDICATOR 8.5 (GLOVE) ×2
GLOVE SS BIOGEL STRL SZ 8.5 (GLOVE) ×1 IMPLANT
GLOVE SUPERSENSE BIOGEL SZ 8.5 (GLOVE) ×2
GOWN STRL REUS W/ TWL LRG LVL3 (GOWN DISPOSABLE) ×1 IMPLANT
GOWN STRL REUS W/TWL 2XL LVL3 (GOWN DISPOSABLE) ×6 IMPLANT
GOWN STRL REUS W/TWL LRG LVL3 (GOWN DISPOSABLE) ×3
IV CATH 14GX2 1/4 (CATHETERS) IMPLANT
KIT BASIN OR (CUSTOM PROCEDURE TRAY) ×3 IMPLANT
KIT POSITION SURG JACKSON T1 (MISCELLANEOUS) ×3 IMPLANT
KIT TURNOVER KIT B (KITS) ×3 IMPLANT
MIX DBX 10CC 35% BONE (Bone Implant) ×4 IMPLANT
NEEDLE 22X1 1/2 (OR ONLY) (NEEDLE) ×3 IMPLANT
NS IRRIG 1000ML POUR BTL (IV SOLUTION) ×3 IMPLANT
NUT HH X CONN OUTER (Orthopedic Implant) ×4 IMPLANT
PACK LAMINECTOMY ORTHO (CUSTOM PROCEDURE TRAY) ×3 IMPLANT
PACK UNIVERSAL I (CUSTOM PROCEDURE TRAY) ×3 IMPLANT
PAD ARMBOARD 7.5X6 YLW CONV (MISCELLANEOUS) ×6 IMPLANT
PATTIES SURGICAL .5 X.5 (GAUZE/BANDAGES/DRESSINGS) ×2 IMPLANT
PATTIES SURGICAL .5 X1 (DISPOSABLE) ×1 IMPLANT
PLATE HH X CONN 28MM (Plate) ×2 IMPLANT
ROD LORD TI 3.5X60 (Rod) ×4 IMPLANT
SCREW F A 3.5X14 (Screw) ×16 IMPLANT
SCREW HH X CONN INNER (Screw) ×4 IMPLANT
SCREW INNER (Screw) ×16 IMPLANT
SCREW MOUNTAINEER ML 4.0X14 (Screw) ×4 IMPLANT
SPONGE LAP 4X18 RFD (DISPOSABLE) ×6 IMPLANT
SPONGE SURGIFOAM ABS GEL 100 (HEMOSTASIS) ×5 IMPLANT
STRIP CLOSURE SKIN 1/2X4 (GAUZE/BANDAGES/DRESSINGS) IMPLANT
SURGIFLO W/THROMBIN 8M KIT (HEMOSTASIS) ×4 IMPLANT
SURGILUBE 2OZ TUBE FLIPTOP (MISCELLANEOUS) ×27 IMPLANT
SUT BONE WAX W31G (SUTURE) ×3 IMPLANT
SUT ETHILON 3 0 FSL (SUTURE) IMPLANT
SUT MNCRL AB 3-0 PS2 27 (SUTURE) ×2 IMPLANT
SUT PDS AB 1 CTX 36 (SUTURE) ×3 IMPLANT
SUT PDS AB 2-0 CT1 27 (SUTURE) ×2 IMPLANT
SUT PROLENE 0 CT (SUTURE) ×3 IMPLANT
SUT PROLENE 2 0 CT2 30 (SUTURE) ×3 IMPLANT
SUT VIC AB 0 CT1 27 (SUTURE) ×6
SUT VIC AB 0 CT1 27XBRD ANBCTR (SUTURE) ×2 IMPLANT
SUT VIC AB 1 CT1 18XCR BRD 8 (SUTURE) IMPLANT
SUT VIC AB 1 CT1 8-18 (SUTURE) ×6
SUT VIC AB 1 CTX 36 (SUTURE) ×3
SUT VIC AB 1 CTX36XBRD ANBCTR (SUTURE) ×2 IMPLANT
SUT VIC AB 2-0 CT1 18 (SUTURE) ×5 IMPLANT
SYR BULB IRRIG 60ML STRL (SYRINGE) ×3 IMPLANT
SYR CONTROL 10ML LL (SYRINGE) ×3 IMPLANT
TAP MOUNTAINEER 3MM (TAP) ×2 IMPLANT
TAP SURG 3.5 MOUNTAINEER (BIT) ×2 IMPLANT
TAP SURG MOUNTAINEER 4 NONSTRL (TAP) ×2 IMPLANT
TOWEL GREEN STERILE (TOWEL DISPOSABLE) ×3 IMPLANT
TOWEL GREEN STERILE FF (TOWEL DISPOSABLE) ×3 IMPLANT
TRAY FOLEY MTR SLVR 16FR STAT (SET/KITS/TRAYS/PACK) ×3 IMPLANT
WATER STERILE IRR 1000ML POUR (IV SOLUTION) ×3 IMPLANT
YANKAUER SUCT BULB TIP NO VENT (SUCTIONS) ×3 IMPLANT

## 2020-04-04 NOTE — Progress Notes (Signed)
Patyient is transported to OR at this time. Alert and in stable condition. Report given to Sam, RN with all questions answered. Left unit via bed.

## 2020-04-04 NOTE — Anesthesia Procedure Notes (Signed)
Procedure Name: Intubation Date/Time: 04/04/2020 2:16 PM Performed by: Alireza Pollack T, CRNA Pre-anesthesia Checklist: Patient identified, Emergency Drugs available, Suction available and Patient being monitored Patient Re-evaluated:Patient Re-evaluated prior to induction Oxygen Delivery Method: Circle system utilized Preoxygenation: Pre-oxygenation with 100% oxygen Induction Type: IV induction Ventilation: Mask ventilation without difficulty Laryngoscope Size: Glidescope Grade View: Grade I Tube type: Oral Tube size: 7.0 mm Number of attempts: 1 Airway Equipment and Method: Stylet and Video-laryngoscopy Placement Confirmation: ETT inserted through vocal cords under direct vision,  positive ETCO2 and breath sounds checked- equal and bilateral Secured at: 22 cm Tube secured with: Tape Dental Injury: Teeth and Oropharynx as per pre-operative assessment  Comments: Performed by Sueanne Margarita, SRNA

## 2020-04-04 NOTE — Brief Op Note (Signed)
04/04/2020  5:59 PM  PATIENT:  Debra Farmer  74 y.o. 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  POST-OPERATIVE DIAGNOSIS:  Cervical spondylotic meyloradiculopathy  PROCEDURE:  Procedure(s) with comments: POSTERIOR SPINAL FUSION INSTRUMENTATION CERVICAL THREE THROUGH SEVEN, CERVICAL THREE LAMINOTOMY (N/A) - 5 hrs  SURGEON:  Surgeon(s) and Role:    Melina Schools, MD - Primary  PHYSICIAN ASSISTANT:   ASSISTANTS: Amanda Ward, PA   ANESTHESIA:   general  EBL:  100 mL   BLOOD ADMINISTERED:none  DRAINS: none   LOCAL MEDICATIONS USED:  MARCAINE    and OTHER exparel  SPECIMEN:  No Specimen  DISPOSITION OF SPECIMEN:  N/A  COUNTS:  YES  TOURNIQUET:  * No tourniquets in log *  DICTATION: .Dragon Dictation  PLAN OF CARE: Admit to inpatient   PATIENT DISPOSITION:  PACU - hemodynamically stable.

## 2020-04-04 NOTE — Progress Notes (Signed)
    Subjective: Procedure(s) (LRB): POSTERIOR SPINAL FUSION INTERBODY C3-7, LEFT C3-4 DECOMPRESSION OF CYST (N/A) Day of Surgery  Patient reports pain as 3 on 0-10 scale.  Reports decreased arm pain reports incisional neck pain   Positive void Negative bowel movement Negative flatus Negative chest pain or shortness of breath  Objective: Vital signs in last 24 hours: Temp:  [97.6 F (36.4 C)-99.1 F (37.3 C)] 98.2 F (36.8 C) (10/21 1149) Pulse Rate:  [64-107] 64 (10/21 1149) Resp:  [11-18] 16 (10/21 1149) BP: (122-169)/(60-91) 130/61 (10/21 1149) SpO2:  [94 %-99 %] 97 % (10/21 1149) Arterial Line BP: (136-161)/(59-72) 136/72 (10/20 1605)  Intake/Output from previous day: 10/20 0701 - 10/21 0700 In: 2580 [P.O.:120; I.V.:2000; IV Piggyback:250] Out: 2226 [Urine:2075; Stool:1; Blood:150]  Labs: Recent Labs    04/01/20 1339  WBC 11.7*  RBC 5.02  HCT 46.1*  PLT 324   Recent Labs    04/01/20 1339  NA 136  K 3.4*  CL 100  CO2 24  BUN 8  CREATININE 0.68  GLUCOSE 120*  CALCIUM 9.4   Recent Labs    04/01/20 1339  INR 0.9    Physical Exam: Neurologically intact ABD soft Intact pulses distally Incision: dressing C/D/I and no drainage No cellulitis present Body mass index is 26.83 kg/m.  Assessment/Plan: Patient stable  xrays n/a Mobilization with physical therapy Encourage incentive spirometry Continue care  Patient is doing well status post anterior cervical discectomy and fusion C3-7.  There is no significant swelling or shortness of breath.  She states that the neuropathic arm pain has improved as has her balance.  At this point we are going to plan on moving forward with the posterior supplement aided fixation and fusion and laminotomy of C3 to further decompress the cyst.  I have gone through the risks and benefits as well as alternatives of surgery with the patient and she is again informed me that she is aware and willing to move forward with  surgery.  Melina Schools, MD [facility] [phone]

## 2020-04-04 NOTE — Transfer of Care (Signed)
Immediate Anesthesia Transfer of Care Note  Patient: Debra Farmer  Procedure(s) Performed: POSTERIOR SPINAL FUSION INSTRUMENTATION CERVICAL THREE THROUGH SEVEN, CERVICAL THREE LAMINOTOMY (N/A Spine Cervical)  Patient Location: PACU  Anesthesia Type:General  Level of Consciousness: drowsy  Airway & Oxygen Therapy: Patient Spontanous Breathing and Patient connected to nasal cannula oxygen  Post-op Assessment: Report given to RN, Post -op Vital signs reviewed and stable and Patient moving all extremities  Post vital signs: Reviewed and stable  Last Vitals:  Vitals Value Taken Time  BP 167/94 04/04/20 1812  Temp 36.1 C 04/04/20 1810  Pulse 85 04/04/20 1821  Resp 9 04/04/20 1821  SpO2 96 % 04/04/20 1821  Vitals shown include unvalidated device data.  Last Pain:  Vitals:   04/04/20 1815  TempSrc:   PainSc: Asleep      Patients Stated Pain Goal: 3 (00/71/21 9758)  Complications: No complications documented.

## 2020-04-04 NOTE — Op Note (Signed)
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  C3-7 posterior instrumented fusion  Complications: None  Condition: Stable  First Assistant: Cleta Alberts, PA  Implants: DePuy Mountaineer posterior cervical lateral mass screw fixation.  3.5 x 14 mm screws at C3-C6 bilaterally.  4.0 x 14 mm screw at C7 bilaterally.  62mm locking rod with a single cross-link at C5.  Allograft: DBX mix plus local graft from spinous process.  EBL:  102  Indications: Debra Farmer is a very pleasant 74 year old woman who presented with cervical spondylitic myeloradiculopathy.  She underwent an anterior cervical discectomy fusion C3-7 yesterday and presents today for planned staged posterior fusion.  She also has a small facet cyst at C3 on the left and so a small laminotomy was performed in order to adequately press that area.  All appropriate risks benefits and alternatives were discussed with the patient and consent was obtained.  Operative report: Patient was brought the operating room.  After successful induction of general anesthesia and endotracheal intubation she was turned prone onto the [facility] spine frame.  Her Foley was placed appropriately and the arms were secured at the side.  The posterior cervical spine was prepped and draped in a standard fashion.  Timeout was taken to confirm patient procedure and all important data.  Fluoroscopy was used to identify the base of the C2 spinous process and the inferior aspect of the T1 spinous process.  The incision site was marked out and infiltrated with quarter percent Marcaine with epinephrine.  Midline incision was then made and sharp dissection was carried out deep fascia.  I then infiltrated the paraspinal muscles with quarter percent Marcaine with epinephrine and Exparel.  This was done for postoperative analgesia and intraoperative hemostasis.  I then  stripped the paraspinal muscles to expose the spinous process from C3 down to C7.  I then dissected and exposed the lamina and the lateral mass from C3-C7.  At this point posterior bony anatomy was exposed for instrumentation.  Using a standard margel technique I placed the lateral mass screws.  Identified the inferior medial quadrant of the lateral mass and used my awl to broach the cortex.  I then aimed my drill approximately 10 degrees cephalad 20 degrees lateral I advanced the drill into the lateral mass confirmed with lateral fluoroscopy.  I then remove the drill sounded the canal with a ball-tipped feeler and then used my tap.  After the tap I again sounded the bony canal with a ball-tipped feeler and then placed the screw.  I repeated this exact same technique at C4, C5, C6, and C7 and C3-7 on the contralateral side.  At this point all screws were properly positioned and I confirmed with fluoroscopy satisfactory position.  At this point I noted that the C3-4 facet joint had opened up and adequately decompressed itself.  I then used a 1 mm Kerrison rongeur to remove a portion of the lamina.  Is at that point synovial drainage was noted consistent with the cyst.  At this point with a small laminotomy performed at C3 and the widening and decompression of the facet joint I felt as though I did not need to be more aggressive in removing the cyst.  This would put her at increased risk for possible neurological injury.  I felt as though with the ACDF, and the adequate posterior fixation and the fact that clinically she was making improvements, a more aggressive decompression was not  needed.  At this point high-speed bur was used to decorticate the remaining portion of the spinous process and lamina from C3 down to C7.  DBX bone mix was used and placed for the fusion.  The rod was then measured and then placed and locked into place.  All locking caps were torqued according manufacture standards.  A single  cross-linking device was placed at C5 in order to improve the stability.  At this point the wound was copiously irrigated and made sure that hemostasis.  There was no active bleeding.  At this point I closed in a layered fashion with interrupted #1 Vicryl suture, then 2-0 Vicryl suture.  The skin was closed with a running vertical mattress suture with 2-0 Prolene.  Dry dressing was applied and the patient was ultimately extubated transfer the PACU without incident.  The end of the case all needle sponge counts were correct.  There were no adverse intraoperative events.

## 2020-04-04 NOTE — Anesthesia Preprocedure Evaluation (Signed)
Anesthesia Evaluation  Patient identified by MRN, date of birth, ID band Patient awake    Reviewed: Allergy & Precautions, NPO status , Unable to perform ROS - Chart review only  Airway Mallampati: I  TM Distance: >3 FB Neck ROM: Limited    Dental no notable dental hx.    Pulmonary COPD, Patient abstained from smoking., former smoker,    Pulmonary exam normal breath sounds clear to auscultation       Cardiovascular Exercise Tolerance: Good + CAD  Normal cardiovascular exam Rhythm:Regular Rate:Normal     Neuro/Psych  Headaches, PSYCHIATRIC DISORDERS Depression  Neuromuscular disease (h/o Bell's palsy on the right)    GI/Hepatic Neg liver ROS, GERD  ,  Endo/Other  negative endocrine ROS  Renal/GU negative Renal ROS  negative genitourinary   Musculoskeletal  (+) Arthritis , Osteoarthritis,    Abdominal Normal abdominal exam  (+)   Peds  Hematology negative hematology ROS (+)   Anesthesia Other Findings   Reproductive/Obstetrics negative OB ROS                             Anesthesia Physical  Anesthesia Plan  ASA: III  Anesthesia Plan: General   Post-op Pain Management:    Induction: Intravenous  PONV Risk Score and Plan: 3 and Midazolam, Propofol infusion, Dexamethasone and Ondansetron  Airway Management Planned: Oral ETT  Additional Equipment: Arterial line  Intra-op Plan:   Post-operative Plan: Extubation in OR  Informed Consent: I have reviewed the patients History and Physical, chart, labs and discussed the procedure including the risks, benefits and alternatives for the proposed anesthesia with the patient or authorized representative who has indicated his/her understanding and acceptance.     Dental advisory given  Plan Discussed with: CRNA and Anesthesiologist  Anesthesia Plan Comments: (Arterial line given length of case. Patient received stress dose steroids  during her surgeryer yesterday 10/20. Will hold off today and only give if issues with hyptoension perioperatively. Norton Blizzard, MD  )        Anesthesia Quick Evaluation

## 2020-04-04 NOTE — Anesthesia Procedure Notes (Signed)
Arterial Line Insertion Start/End10/21/2021 12:57 PM, 04/04/2020 12:57 PM Performed by: CRNA  Patient location: Pre-op. Preanesthetic checklist: patient identified, IV checked, site marked, risks and benefits discussed, surgical consent, monitors and equipment checked, pre-op evaluation, timeout performed and anesthesia consent Lidocaine 1% used for infiltration Right, radial was placed Catheter size: 20 G Hand hygiene performed  and maximum sterile barriers used   Attempts: 1 Procedure performed without using ultrasound guided technique. Following insertion, Biopatch and dressing applied. Post procedure assessment: normal  Patient tolerated the procedure well with no immediate complications.

## 2020-04-04 NOTE — OR Nursing (Signed)
Pt is awake,alert and oriented.Pt and/or family verbalized understanding of poc and discharge instructions. Reviewed admission and on going care with receiving RN. Pt is in NAD at this time and is ready to be transferred to floor. Will con't to monitor until pt is transferred. Report given to floor RN

## 2020-04-05 ENCOUNTER — Encounter (HOSPITAL_COMMUNITY): Payer: Self-pay | Admitting: Orthopedic Surgery

## 2020-04-05 MED ORDER — OXYCODONE-ACETAMINOPHEN 10-325 MG PO TABS: 1.0000 | ORAL_TABLET | Freq: Four times a day (QID) | ORAL | 0 refills | Status: AC | PRN

## 2020-04-05 MED ORDER — METHOCARBAMOL 500 MG PO TABS: 500.0000 mg | ORAL_TABLET | Freq: Three times a day (TID) | ORAL | 0 refills | Status: AC | PRN

## 2020-04-05 MED ORDER — ONDANSETRON HCL 4 MG PO TABS: 4.0000 mg | ORAL_TABLET | Freq: Three times a day (TID) | ORAL | 0 refills | Status: DC | PRN

## 2020-04-05 MED FILL — Thrombin (Recombinant) For Soln 20000 Unit: CUTANEOUS | Qty: 1 | Status: AC

## 2020-04-05 NOTE — Evaluation (Signed)
Occupational Therapy Evaluation Patient Details Name: Debra Farmer MRN: [id number] DOB: [birth year] Today's Date: 04/05/2020    History of Present Illness Pt is a 74 y.o. 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 S/p ACDF C3-7 on 10/20. S/p C3-7 posterior fusion and C3 laminotomy on 10/21. PMH includes COPD, arthritis, GERD, Bells Palsy (2016).   Clinical Impression   Patient is s/p C3-7 ACDF surgery resulting in functional limitations due to the deficits listed below (see OT problem list). Pt currently with balance deficits and reports 10/10 pain posteriorly. Pt with numbness in bil numbness.  Patient will benefit from skilled OT acutely to increase independence and safety with ADLS to allow discharge [place].     Follow Up Recommendations  Home health OT    Equipment Recommendations  Other (comment) (RW)    Recommendations for Other Services       Precautions / Restrictions Precautions Precautions: Fall;Cervical Precaution Booklet Issued: No (Pt declined, already has one from ortho office) Precaution Comments: handout provided and reviewed for adls. JP drain in place Required Braces or Orthoses: Cervical Brace Cervical Brace: Hard collar Restrictions Weight Bearing Restrictions: No      Mobility Bed Mobility Overal bed mobility: Needs Assistance Bed Mobility: Supine to Sit;Sit to Supine     Supine to sit: Min assist (hob 20 degrees  mod cues) Sit to supine: Min assist   General bed mobility comments: cues for cervical precautions and not to long sit from surface.     Transfers Overall transfer level: Needs assistance Equipment used: Rolling walker (2 wheeled) Transfers: Sit to/from Stand Sit to Stand: Min guard         General transfer comment: cues for safety with and not to abandon RW    Balance Overall balance assessment: Mild deficits observed, not formally tested   Sitting balance-Leahy Scale: Good        Standing balance-Leahy Scale: Fair Standing balance comment: Can static stand to wash hands without UE support                           ADL either performed or assessed with clinical judgement   ADL Overall ADL's : Needs assistance/impaired Eating/Feeding: Modified independent;Bed level   Grooming: Wash/dry face;Oral care;Min guard;Standing Grooming Details (indicate cue type and reason): cues for safety with cervical precautions use of cups Upper Body Bathing: Minimal assistance   Lower Body Bathing: Moderate assistance       Lower Body Dressing: Moderate assistance Lower Body Dressing Details (indicate cue type and reason): reports "they will dress me" introduced the topic of reacher and AE and pt states "its okay they will help me"  Toilet Transfer: Min guard;Regular Toilet;Grab bars;RW Toilet Transfer Details (indicate cue type and reason): cues for safety with RW. pt attempts to push to the side Toileting- Clothing Manipulation and Hygiene: Minimal assistance       Functional mobility during ADLs: Min guard;Rolling walker General ADL Comments: educated throughout session on RW safety. pt attempts to abandon RW at the bathroom doorway area and walk the walls toward the bed. pt needs cues for correct sequence to return to supine     Vision Baseline Vision/History: Wears glasses       Perception     Praxis      Pertinent Vitals/Pain Pain Assessment: 0-10 Pain Score: 10-Worst pain ever Faces Pain Scale: Hurts even more Pain Location: posterior aspect of the neck Pain Descriptors / Indicators: Discomfort;Operative  site guarding Pain Intervention(s): Limited activity within patient's tolerance;Premedicated before session;Repositioned     Hand Dominance Right   Extremity/Trunk Assessment Upper Extremity Assessment Upper Extremity Assessment: RUE deficits/detail;LUE deficits/detail RUE Deficits / Details: reports 3rd digit is the most affective, reports  the whole hand is numb " feels like raw meat"  reports dropping her keys prior to surgery into the elevator shaft and not having awareness RUE Sensation: decreased light touch RUE Coordination: decreased fine motor;decreased gross motor LUE Deficits / Details: numbness throughout hand LUE Sensation: decreased light touch LUE Coordination: decreased fine motor;decreased gross motor   Lower Extremity Assessment Lower Extremity Assessment: Defer to PT evaluation   Cervical / Trunk Assessment Cervical / Trunk Assessment: Other exceptions Cervical / Trunk Exceptions: s/p surgery   Communication Communication Communication: No difficulties   Cognition Arousal/Alertness: Awake/alert Behavior During Therapy: WFL for tasks assessed/performed Overall Cognitive Status: Within Functional Limits for tasks assessed                                     General Comments  dressing intact with drain in place. Pt educated on don doff cervical collar. the collar was very loose on arrival with 2 finger space gap at chin. pt educated on proper fit and snug fit. pt initially states its choking me but with education reports "the brace is okay now"    Exercises Exercises: Hand exercises Hand Exercises Digit Composite Flexion: Both;5 reps;Supine Digit Composite Abduction: Both;5 reps;Supine   Shoulder Instructions      Home Living Family/patient expects to be discharged to:: Private residence Living Arrangements: Spouse/significant other Available Help at Discharge: Family;Available PRN/intermittently Type of Home: House Home Access: Stairs to enter [employer] of Steps: 3 Entrance Stairs-Rails: Right;Left;Can reach both Home Layout: One level     Bathroom Shower/Tub: Walk-in shower;Tub/shower unit   [employer]: Standard     Home Equipment: Shower seat - built in;Grab bars - tub/shower   Additional Comments: x3 dogs in the home (2 german shepherds/ 1 husky)  reports spouse can manage all dog care. 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        OT Problem List: Decreased strength;Decreased activity tolerance;Impaired balance (sitting and/or standing);Decreased safety awareness;Decreased knowledge of use of DME or AE;Decreased knowledge of precautions;Decreased cognition;Pain      OT Treatment/Interventions: Self-care/ADL training;Therapeutic exercise;Energy conservation;Neuromuscular education;DME and/or AE instruction;Manual therapy;Therapeutic activities;Cognitive remediation/compensation;Patient/family education;Balance training    OT Goals(Current goals can be found in the care plan section) Acute Rehab OT Goals Patient Stated Goal: to get therapy at home because my hip doctor says going to outpatient is a great way to get a staph infection that i need to get therapy at home after surgeries OT Goal Formulation: With patient Time For Goal Achievement: 04/19/20 Potential to Achieve Goals: Good  OT Frequency: Min 2X/week   Barriers to D/C:            Co-evaluation              AM-PAC OT "6 Clicks" Daily Activity     Outcome Measure Help from another person eating meals?: A Little Help from another person taking care of personal grooming?: A Little Help from another person toileting,  which includes using toliet, bedpan, or urinal?: A Little Help from another person bathing (including washing, rinsing, drying)?: A Lot Help from another person to put on and taking off regular upper body clothing?: A Little Help from another person to put on and taking off regular lower body clothing?: A Lot 6 Click Score: 16   End of Session Equipment Utilized During Treatment: Rolling walker;Cervical collar Nurse Communication: Mobility  status;Precautions  Activity Tolerance: Patient tolerated treatment well Patient left: in bed;with call bell/phone within reach  OT Visit Diagnosis: Unsteadiness on feet (R26.81);Muscle weakness (generalized) (M62.81);Pain                Time: 2103-1281 OT Time Calculation (min): 31 min Charges:  OT General Charges $OT Visit: 1 Visit OT Evaluation $OT Eval Moderate Complexity: 1 Mod OT Treatments $Self Care/Home Management : 8-22 mins   Brynn, OTR/L  Acute Rehabilitation Services Pager: [phone] Office: [phone] .   Jeri Modena 04/05/2020, 11:27 AM

## 2020-04-05 NOTE — Progress Notes (Signed)
    Subjective: Procedure(s) (LRB): POSTERIOR SPINAL FUSION INSTRUMENTATION CERVICAL THREE THROUGH SEVEN, CERVICAL THREE LAMINOTOMY (N/A) 1 Day Post-Op  Patient reports pain as 1 on 0-10 scale.  Reports decreased arm pain reports incisional neck pain   Positive void Negative bowel movement Positive flatus Negative chest pain or shortness of breath  Objective: Vital signs in last 24 hours: Temp:  [97 F (36.1 C)-98.2 F (36.8 C)] 98.1 F (36.7 C) (10/22 0755) Pulse Rate:  [64-87] 84 (10/22 0755) Resp:  [9-23] 17 (10/22 0755) BP: (130-186)/(58-94) 162/71 (10/22 0755) SpO2:  [94 %-97 %] 95 % (10/22 0755) Arterial Line BP: (186)/(69) 186/69 (10/21 1810)  Intake/Output from previous day: 10/21 0701 - 10/22 0700 In: 2440 [P.O.:240; I.V.:2000; IV Piggyback:200] Out: 3580 [Urine:3460; Drains:20; Blood:100]  Labs: No results for input(s): WBC, RBC, HCT, PLT in the last 72 hours. No results for input(s): NA, K, CL, CO2, BUN, CREATININE, GLUCOSE, CALCIUM in the last 72 hours. No results for input(s): LABPT, INR in the last 72 hours.  Physical Exam: Neurologically intact ABD soft Intact pulses distally Incision: dressing C/D/I and no drainage Compartment soft Body mass index is 26.83 kg/m.  Assessment/Plan: Patient stable  xrays n/a Mobilization with physical therapy Encourage incentive spirometry Continue care  Advance diet Up with therapy  1. Patient doing exceptional well this AM 2. Minimal neck pain and no significant drainage from anterior drain.  Will d/c drain this AM 3. Plan on d/c to home later today after she works with PT 4. F/u next week to evaluate the posterior cervical wound.    Melina Schools, MD [facility] [phone]

## 2020-04-05 NOTE — TOC Initial Note (Signed)
Transition of Care [facility]) - Initial/Assessment Note    Patient Details  Name: Debra Farmer MRN: [id number] Date of Birth: 04-09-1946  Transition of Care [facility]) CM/SW Contact:    Benard Halsted, LCSW Phone Number: 04/05/2020, 11:34 AM  Clinical Narrative:                 CSW received consult for possible home health services at time of discharge. CSW spoke with patient regarding PT recommendation of Home Health PT at time of discharge. Patient reported that she would like home health services if her insurance pays for it.  Patient reports her [place] is primary over Medicare. Medicare should cover any copays that incur with [employer]. CSW sent referral for review with preferred provider, [facility]. CSW provided Medicare [facility] ratings list. CSW confirmed PCP and address with patient. Patient states her daughter is here to pick her up. CSW obtained her cell number for follow up: [phone].   Expected Discharge Plan: [place] Barriers to Discharge: No Barriers Identified   Patient Goals and CMS Choice Patient states their goals for this hospitalization and ongoing recovery are:: Feel better CMS Medicare.gov Compare Post Acute Care list provided to:: Patient Choice offered to / list presented to : Patient  Expected Discharge Plan and Services Expected Discharge Plan: [place]   Discharge Planning Services: CM Consult Post Acute Care Choice: [place] arrangements for the past 2 months: [place] Expected Discharge Date: 04/05/20                         HH Arranged: PT, OT [place] Agency: [place] ([place]) Date [place]: 04/05/20 Time [place]: 71 Representative spoke with at [place]: [place] Arrangements/Services Living arrangements for the past 2 months: [place] with:: Spouse Patient language and need for interpreter reviewed:: Yes Do you feel safe  going back to the place where you live?: Yes      Need for Family Participation in Patient Care: No (Comment) Care giver support system in place?: Yes (comment)   Criminal Activity/Legal Involvement Pertinent to Current Situation/Hospitalization: No - Comment as needed  Activities of Daily Living Home Assistive Devices/Equipment: Eyeglasses, Contact lenses, Dentures (specify type) ADL Screening (condition at time of admission) Patient's cognitive ability adequate to safely complete daily activities?: Yes Is the patient deaf or have difficulty hearing?: No Does the patient have difficulty seeing, even when wearing glasses/contacts?: No Does the patient have difficulty concentrating, remembering, or making decisions?: No Patient able to express need for assistance with ADLs?: Yes Does the patient have difficulty dressing or bathing?: Yes Independently performs ADLs?: No Communication: Independent Dressing (OT): Needs assistance Is this a change from baseline?: Change from baseline, expected to last <3days Grooming: Independent Feeding: Independent Bathing: Needs assistance Is this a change from baseline?: Change from baseline, expected to last <3 days Toileting: Needs assistance Is this a change from baseline?: Change from baseline, expected to last <3 days In/Out Bed: Needs assistance Is this a change from baseline?: Change from baseline, expected to last <3 days Does the patient have difficulty walking or climbing stairs?: Yes Weakness of Legs: Both Weakness of Arms/Hands: Both  Permission Sought/Granted Permission sought to share information with : Facility Art therapist granted to share information with : Yes, Verbal Permission Granted     Permission granted to share info w AGENCY: [facility]  Emotional Assessment Appearance:: Appears stated age Attitude/Demeanor/Rapport: Gracious, Engaged Affect (typically observed): Accepting, Appropriate,  Pleasant Orientation: : Oriented to Self, Oriented to Place, Oriented to  Time, Oriented to Situation Alcohol / Substance Use: Not Applicable Psych Involvement: No (comment)  Admission diagnosis:  Cervical myelopathy ([place]) [G95.9] Patient Active Problem List   Diagnosis Date Noted  . Cervical myelopathy ([place]) 04/03/2020  . Grief at loss of child 03/20/2020  . Steroid dependence ([place]) 03/20/2020  . Preop exam for internal medicine 03/20/2020  . Bilateral hand numbness 01/24/2020  . Coronary atherosclerosis 09/22/2017  . Chest pain, atypical 09/22/2017  . Primary osteoarthritis of right hip 04/01/2017  . Osteoarthritis of right hip 04/01/2017  . Osteoporosis 03/03/2017  . Decreased libido 10/19/2016  . Spinal stenosis at L4-L5 level 08/26/2016  . 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 Diarrhea 09/23/2015  . Bell's palsy 03/02/2015  . Seronegative rheumatoid arthritis ([place]) 03/02/2015  . Neck pain 04/26/2014  . Herpes zoster 04/05/2014  . Thrush, oral 03/06/2014  . Left arm swelling 01/15/2013  . Cough 09/19/2012  . Insomnia 09/01/2012  . CTS (carpal tunnel syndrome) 04/15/2012  . Arthritis of multiple sites 03/04/2012  . Well adult exam 01/27/2012  . Knee pain, bilateral 01/08/2012  . Hip pain, right 12/26/2010  . DISCITIS 07/25/2010  . SWEATING 05/23/2010  . PARESTHESIA, HANDS 04/25/2010  . COPD mixed type ([place]) 07/26/2009  . SINUSITIS, ACUTE 08/01/2008  . RASH AND OTHER NONSPECIFIC SKIN ERUPTION 08/01/2008  . TOBACCO USE DISORDER/SMOKER-SMOKING CESSATION DISCUSSED 03/08/2008  . BRONCHITIS, ACUTE 03/08/2008  . HYPERLIPIDEMIA 02/27/2008  . CRAMP OF LIMB 02/27/2008  . FATIGUE 02/27/2008  . Wheezing 02/27/2008  . MENINGITIS 08/11/2007  . GERD 08/11/2007  . HEADACHE 08/11/2007  . OSTEOARTHRITIS 01/11/2007   PCP:  Plotnikov, Evie Lacks, MD Pharmacy:   [facility] [place] [address] Phone: [phone] Fax: [phone]     Social Determinants of Health (SDOH) Interventions    Readmission Risk Interventions No flowsheet data found.

## 2020-04-05 NOTE — Discharge Instructions (Signed)

## 2020-04-05 NOTE — Evaluation (Addendum)
Physical Therapy Evaluation & Discharge Patient Details Name: Debra Farmer MRN: [id number] DOB: 1946/01/04 Today's Date: 04/05/2020   History of Present Illness  Pt is a 74 y.o. 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 S/p ACDF C3-7 on 10/20. S/p C3-7 posterior fusion and C3 laminotomy on 10/21. PMH includes COPD, arthritis, GERD, Bells Palsy (2016).    Clinical Impression  Patient evaluated by Physical Therapy with no further acute PT needs identified. PTA, pt independent, works and lives with supportive husband. Today, pt able to transfer and ambulate mod indep with RW; stair training with rail support and min guard. Educ re: precautions, positioning, brace wear, activity recommendations and importance of mobility. All education has been completed and the patient has no further questions. Acute PT is signing off. Thank you for this referral.    Follow Up Recommendations Home Health PT;Supervision - Intermittent    Equipment Recommendations  Rolling walker with 5" wheels    Recommendations for Other Services       Precautions / Restrictions Precautions Precautions: Fall;Cervical Precaution Booklet Issued: No (Pt declined, already has one from ortho office) Precaution Comments: cervical JP drain Required Braces or Orthoses: Cervical Brace Cervical Brace: Hard collar Restrictions Weight Bearing Restrictions: No      Mobility  Bed Mobility Overal bed mobility: Modified Independent             General bed mobility comments: HOB elevated    Transfers Overall transfer level: Modified independent Equipment used: Rolling walker (2 wheeled)                Ambulation/Gait Ambulation/Gait assistance: Modified independent (Device/Increase time) Gait Distance (Feet): 500 Feet Assistive device: Rolling walker (2 wheeled) Gait Pattern/deviations: Step-through pattern;Decreased stride length;Trunk flexed   Gait velocity  interpretation: 1.31 - 2.62 ft/sec, indicative of limited community ambulator General Gait Details: Fast, steady gait mod indep with RW  Stairs Stairs: Yes Stairs assistance: Min guard Stair Management: One rail Right;Step to pattern;Forwards Number of Stairs: 3 General stair comments: Ascend/descend steps with single UE rail support, min guard for balance; pt unable to look down to see steps due to brace/precautions, educ for safety and to have husband guard at home  Wheelchair Mobility    Modified Rankin (Stroke Patients Only)       Balance Overall balance assessment: Needs assistance   Sitting balance-Leahy Scale: Good       Standing balance-Leahy Scale: Fair Standing balance comment: Can static stand to wash hands without UE support                             Pertinent Vitals/Pain Pain Assessment: Faces Faces Pain Scale: Hurts even more Pain Location: Cervical incisions Pain Descriptors / Indicators: Discomfort;Operative site guarding Pain Intervention(s): Monitored during session;Patient requesting pain meds-RN notified    Home Living Family/patient expects to be discharged to:: Private residence Living Arrangements: Spouse/significant other Available Help at Discharge: Family;Available PRN/intermittently Type of Home: House Home Access: Stairs to enter Entrance Stairs-Rails: Right;Left;Can reach both Entrance Stairs-Number of Steps: 3 Home Layout: One level Home Equipment: Shower seat - built in;Grab bars - tub/shower Additional Comments: Supportive husband    Prior Function Level of Independence: Independent         Comments: 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     Hand Dominance        Extremity/Trunk Assessment   Upper Extremity Assessment Upper Extremity Assessment: Defer to OT  evaluation    Lower Extremity Assessment Lower Extremity Assessment: Overall WFL for tasks assessed (h/o bilateral foot numbness)     Cervical / Trunk Assessment Cervical / Trunk Assessment:  (s/p C3-7 surgeon)  Communication   Communication: No difficulties  Cognition Arousal/Alertness: Awake/alert Behavior During Therapy: WFL for tasks assessed/performed Overall Cognitive Status: Within Functional Limits for tasks assessed                                        General Comments      Exercises     Assessment/Plan    PT Assessment [place] does not need any further PT services  PT Problem List         PT Treatment Interventions      PT Goals (Current goals can be found in the Care Plan section)  Acute Rehab PT Goals PT Goal Formulation: All assessment and education complete, DC therapy    Frequency     Barriers to discharge        Co-evaluation               AM-PAC PT "6 Clicks" Mobility  Outcome Measure Help needed turning from your back to your side while in a flat bed without using bedrails?: None Help needed moving from lying on your back to sitting on the side of a flat bed without using bedrails?: None Help needed moving to and from a bed to a chair (including a wheelchair)?: None Help needed standing up from a chair using your arms (e.g., wheelchair or bedside chair)?: None Help needed to walk in hospital room?: None Help needed climbing 3-5 steps with a railing? : A Little 6 Click Score: 23    End of Session Equipment Utilized During Treatment: Gait belt;Cervical collar Activity Tolerance: Patient tolerated treatment well Patient left: in bed;with call bell/phone within reach Nurse Communication: Mobility status PT Visit Diagnosis: Other abnormalities of gait and mobility (R26.89);Pain    Time: 3888-2800 PT Time Calculation (min) (ACUTE ONLY): 15 min   Charges:   PT Evaluation $PT Eval Low Complexity: [place], PT, DPT Acute Rehabilitation Services  Pager [phone] Office [place] 04/05/2020, 9:00 AM

## 2020-04-05 NOTE — Anesthesia Postprocedure Evaluation (Signed)
Anesthesia Post Note  Patient: MICKAYLA TROUTEN  Procedure(s) Performed: POSTERIOR SPINAL FUSION INSTRUMENTATION CERVICAL THREE THROUGH SEVEN, CERVICAL THREE LAMINOTOMY (N/A Spine Cervical)     Patient location during evaluation: PACU Anesthesia Type: General Level of consciousness: sedated Pain management: pain level controlled Vital Signs Assessment: post-procedure vital signs reviewed and stable Respiratory status: spontaneous breathing and respiratory function stable Cardiovascular status: stable Postop Assessment: no apparent nausea or vomiting Anesthetic complications: no   No complications documented.  Last Vitals:  Vitals:   04/05/20 0515 04/05/20 0755  BP: (!) 173/58 (!) 162/71  Pulse: 67 84  Resp: 18 17  Temp: 36.7 C 36.7 C  SpO2: 94% 95%    Last Pain:  Vitals:   04/05/20 0755  TempSrc: Oral  PainSc:                  Merlinda Frederick

## 2020-04-05 NOTE — Progress Notes (Signed)
Patient is discharged from room 3C04 at this time. Alert and in stable condition. IV site d/c'd and instructions read to patient and daughter with understanding verbalized and all questions answered. Left unit via wheelchair with all belongings at side. 

## 2020-04-05 NOTE — TOC Transition Note (Signed)
Transition of Care [facility]) - CM/SW Discharge Note   Patient Details  Name: Debra Farmer MRN: [id number] Date of Birth: Mar 05, 1946  Transition of Care [facility]) CM/SW Contact:  Benard Halsted, LCSW Phone Number: 04/05/2020, 12:11 PM   Clinical Narrative:    Patient [facility] been [facility]/OT. No other needs identified.    Final next level of care: [place] Barriers to Discharge: No Barriers Identified   Patient Goals and CMS Choice Patient states their goals for this hospitalization and ongoing recovery are:: Feel better CMS Medicare.gov Compare Post Acute Care list provided to:: Patient Choice offered to / list presented to : Patient  Discharge Placement                Patient to be transferred to facility by: car   Patient and family notified of of transfer: 04/05/20  Discharge Plan and Services   Discharge Planning Services: CM Consult Post Acute Care Choice: Home Health                    HH Arranged: PT, OT [facility] Agency: [place] ([place]) Date [facility] Agency Contacted: 04/05/20 Time [place]: [id number] Representative spoke with at [place]: [place] ([place]) Interventions     Readmission Risk Interventions No flowsheet data found.

## 2020-04-06 NOTE — Discharge Summary (Signed)
Patient ID: Debra Farmer MRN: [id number] DOB/AGE: 1946/02/24 74 y.o.  Admit date: 04/03/2020 Discharge date: 04/06/2020  Admission Diagnoses:  Active Problems:   Cervical myelopathy [facility])   Discharge Diagnoses:  Active Problems:   Cervical myelopathy (HCC)  status post Procedure(s): POSTERIOR SPINAL FUSION INSTRUMENTATION CERVICAL THREE THROUGH SEVEN, CERVICAL THREE LAMINOTOMY  Past Medical History:  Diagnosis Date  . Arthritis    RA  . COPD (chronic obstructive pulmonary disease) ([place])   . GERD (gastroesophageal reflux disease)   . Neuromuscular disorder ([place]) 02/05/2015   Bells Palsy, right    Surgeries: Procedure(s): POSTERIOR SPINAL FUSION INSTRUMENTATION CERVICAL THREE THROUGH SEVEN, CERVICAL THREE LAMINOTOMY on 04/04/2020   Consultants:   Discharged Condition: Improved  Hospital Course: Debra Farmer is an 74 y.o. female who was admitted 04/03/2020 for operative treatment of cervical myelopathy. Patient failed conservative treatments (please see the history and physical for the specifics) and had severe unremitting pain that affects sleep, daily activities and work/hobbies. After pre-op clearance, the patient was taken to the operating room on 04/04/2020 and underwent  Procedure(s): [place], CERVICAL THREE LAMINOTOMY.    Patient was given perioperative antibiotics:  Anti-infectives (From admission, onward)   Start     Dose/Rate Route Frequency Ordered Stop   04/04/20 2030  ceFAZolin (ANCEF) IVPB 1 g/50 mL premix        1 g 100 mL/hr over 30 Minutes Intravenous Every 8 hours 04/04/20 1935 04/05/20 0459   04/04/20 1100  ceFAZolin (ANCEF) IVPB 2g/100 mL premix        2 g 200 mL/hr over 30 Minutes Intravenous To [facility]Stay [facility] 04/04/20 1036 04/04/20 1432   04/03/20 1800  ceFAZolin (ANCEF) IVPB 1 g/50 mL premix        1 g 100 mL/hr over 30 Minutes Intravenous Every 8 hours 04/03/20 1702  04/04/20 1122   04/03/20 0629  ceFAZolin (ANCEF) IVPB 2g/100 mL premix        2 g 200 mL/hr over 30 Minutes Intravenous 30 min pre-op 04/03/20 1601 04/03/20 0851       Patient was given sequential compression devices and early ambulation to prevent DVT.   Patient benefited maximally from hospital stay and there were no complications. At the time of discharge, the patient was urinating/moving their bowels without difficulty, tolerating a regular diet, pain is controlled with oral pain medications and they have been cleared by PT/OT.   Recent vital signs: No data found.   Recent laboratory studies: No results for input(s): WBC, HGB, HCT, PLT, NA, K, CL, CO2, BUN, CREATININE, GLUCOSE, INR, CALCIUM in the last 72 hours.  Invalid input(s): PT, 2   Discharge Medications:   Allergies as of 04/05/2020      Reactions   Aspirin Other (See Comments)   MUST HAVE COATED ASA   Codeine Sulfate Other (See Comments)   "drugged out feeling" & hallucinations   Lipitor [atorvastatin] Other (See Comments)   Muscle cramps   Nabumetone Other (See Comments)   Unsure of reaction (reaction occurred sometime ago)   Plaquenil [hydroxychloroquine]    Weak, blurred vision   Pravastatin    Weak legs, arthralgias   Senna Other (See Comments)   Causes bleeding from the anus   Statins Other (See Comments)   Muscle aches and cramps      Medication List    STOP taking these medications   aspirin 81 MG EC tablet   traMADol 50 MG tablet Commonly  known as: ULTRAM     TAKE these medications   leflunomide 20 MG tablet Commonly known as: ARAVA Take 20 mg by mouth daily.   melatonin 3 MG Tabs tablet Take 3 mg by mouth at bedtime as needed (for sleep).   methocarbamol 500 MG tablet Commonly known as: Robaxin Take 1 tablet (500 mg total) by mouth every 8 (eight) hours as needed for up to 5 days for muscle spasms.   multivitamin with minerals tablet Take 1 tablet by mouth daily. Immune   ondansetron  4 MG tablet Commonly known as: Zofran Take 1 tablet (4 mg total) by mouth every 8 (eight) hours as needed for nausea or vomiting.   oxyCODONE-acetaminophen 10-325 MG tablet Commonly known as: Percocet Take 1 tablet by mouth every 6 (six) hours as needed for up to 5 days for pain.   predniSONE 1 MG tablet Commonly known as: DELTASONE Take 4 tablets (4 mg total) by mouth daily with breakfast.   vitamin B-12 1000 MCG tablet Commonly known as: CYANOCOBALAMIN Take 3,000 mcg by mouth in the morning and at bedtime.   Vitamin D3 125 MCG (5000 UT) Tabs Take 5,000 Units by mouth in the morning and at bedtime. WITH A GLASS OF VANILLA-HONEY ALMOND MILK   zolpidem 10 MG tablet Commonly known as: AMBIEN Take 1 tablet (10 mg total) by mouth at bedtime as needed. for sleep What changed:   when to take this  additional instructions       Diagnostic Studies: DG Chest 2 View  Result Date: 04/01/2020 CLINICAL DATA:  Preop ACDF EXAM: CHEST - 2 VIEW COMPARISON:  09/21/2017 FINDINGS: The heart size and mediastinal contours are within normal limits. Both lungs are clear. The visualized skeletal structures are unremarkable. Aortic atherosclerosis. IMPRESSION: No active cardiopulmonary disease. Electronically Signed   By: Donavan Foil M.D.   On: 04/01/2020 23:13   DG Cervical Spine 2-3 Views  Result Date: 04/04/2020 CLINICAL DATA:  Surgery, elective. Additional history provided: C3-7 posterior fusion. Reported fluoroscopy time 1 minutes, 24 seconds (9.86 mGy). EXAM: CERVICAL SPINE - 2-3 VIEW; DG C-ARM 1-60 MIN COMPARISON:  Radiographs of the cervical spine 04/03/2020. FINDINGS: PA and lateral view intraoperative fluoroscopic images of the cervical spine are submitted, 4 images total. The images demonstrate a new posterior spinal fusion construct spanning the C3-C7 levels utilizing bilateral screws and vertical interconnecting rods. No unexpected finding. Redemonstrated sequela of prior C3-C7 ACDF.  Partially visualized support tubes. IMPRESSION: Four intraoperative fluoroscopic images of the cervical spine as described. Electronically Signed   By: Kellie Simmering DO   On: 04/04/2020 17:15   DG Cervical Spine 2-3 Views  Result Date: 04/03/2020 CLINICAL DATA:  Anterior fusion EXAM: DG C-ARM 1-60 MIN; CERVICAL SPINE - 2-3 VIEW FLUOROSCOPY TIME:  Fluoroscopy Time: 0 minutes 22 second Radiation Exposure Index (if provided by the fluoroscopic device): 2.93 mGy Number of Acquired Spot Images: 2 COMPARISON:  None. FINDINGS: Frontal and lateral view show anterior screw and plate fixation from O7-S9 with disc spacers at C3-4, C4-5, C5-6, and C6-7. Support hardware intact. No fracture or spondylolisthesis. Disc spaces appear unremarkable. IMPRESSION: Status post anterior screw and plate fixation from C3 to C7 with disc spacers at C3-4, C4-5, C5-6, and C6-7. Support hardware intact. No fracture or spondylolisthesis. No appreciable disc space narrowing. Electronically Signed   By: Lowella Grip III M.D.   On: 04/03/2020 14:20   DG C-Arm 1-60 Min  Result Date: 04/04/2020 CLINICAL DATA:  Surgery, elective. Additional history  provided: C3-7 posterior fusion. Reported fluoroscopy time 1 minutes, 24 seconds (9.86 mGy). EXAM: CERVICAL SPINE - 2-3 VIEW; DG C-ARM 1-60 MIN COMPARISON:  Radiographs of the cervical spine 04/03/2020. FINDINGS: PA and lateral view intraoperative fluoroscopic images of the cervical spine are submitted, 4 images total. The images demonstrate a new posterior spinal fusion construct spanning the C3-C7 levels utilizing bilateral screws and vertical interconnecting rods. No unexpected finding. Redemonstrated sequela of prior C3-C7 ACDF. Partially visualized support tubes. IMPRESSION: Four intraoperative fluoroscopic images of the cervical spine as described. Electronically Signed   By: Kellie Simmering DO   On: 04/04/2020 17:15   DG C-Arm 1-60 Min  Result Date: 04/03/2020 CLINICAL DATA:  Anterior  fusion EXAM: DG C-ARM 1-60 MIN; CERVICAL SPINE - 2-3 VIEW FLUOROSCOPY TIME:  Fluoroscopy Time: 0 minutes 22 second Radiation Exposure Index (if provided by the fluoroscopic device): 2.93 mGy Number of Acquired Spot Images: 2 COMPARISON:  None. FINDINGS: Frontal and lateral view show anterior screw and plate fixation from X5-O8 with disc spacers at C3-4, C4-5, C5-6, and C6-7. Support hardware intact. No fracture or spondylolisthesis. Disc spaces appear unremarkable. IMPRESSION: Status post anterior screw and plate fixation from C3 to C7 with disc spacers at C3-4, C4-5, C5-6, and C6-7. Support hardware intact. No fracture or spondylolisthesis. No appreciable disc space narrowing. Electronically Signed   By: Lowella Grip III M.D.   On: 04/03/2020 14:20    Discharge Instructions    Incentive spirometry RT   Complete by: As directed    Incentive spirometry RT   Complete by: As directed        Follow-up Information    Melina Schools, MD. Schedule an appointment as soon as possible for a visit on 04/12/2020.   Specialty: Orthopedic Surgery Contact information: [address] [place] [address] [phone]        [facility] Follow up.   Specialty: [place] Why: [phone] Home Health PT/OT services arranged.               Discharge Plan:  discharge to home  Disposition: stable    Signed: Yvonne Kendall Byrd [place] for [facility] PA-C [facility] [phone] 04/06/2020, 1:34 PM

## 2020-04-18 ENCOUNTER — Encounter (HOSPITAL_COMMUNITY): Payer: Self-pay | Admitting: Orthopedic Surgery

## 2020-05-11 ENCOUNTER — Emergency Department (HOSPITAL_COMMUNITY)
Admission: EM | Admit: 2020-05-11 | Discharge: 2020-05-12 | Disposition: A | Discharge status: Home / Self Care | Payer: 59 | Attending: Emergency Medicine | Admitting: Emergency Medicine

## 2020-05-11 ENCOUNTER — Other Ambulatory Visit: Payer: Self-pay

## 2020-05-11 DIAGNOSIS — Z96641 Presence of right artificial hip joint: Secondary | ICD-10-CM | POA: Diagnosis not present

## 2020-05-11 DIAGNOSIS — J449 Chronic obstructive pulmonary disease, unspecified: Secondary | ICD-10-CM | POA: Insufficient documentation

## 2020-05-11 DIAGNOSIS — R059 Cough, unspecified: Secondary | ICD-10-CM | POA: Diagnosis present

## 2020-05-11 DIAGNOSIS — U071 COVID-19: Secondary | ICD-10-CM | POA: Insufficient documentation

## 2020-05-11 DIAGNOSIS — Z87891 Personal history of nicotine dependence: Secondary | ICD-10-CM | POA: Diagnosis not present

## 2020-05-11 LAB — CBC WITH DIFFERENTIAL/PLATELET
Abs Immature Granulocytes: 0.04 10*3/uL (ref 0.00–0.07)
Basophils Absolute: 0 10*3/uL (ref 0.0–0.1)
Basophils Relative: 0 %
Eosinophils Absolute: 0 10*3/uL (ref 0.0–0.5)
Eosinophils Relative: 0 %
HCT: 46 % (ref 36.0–46.0)
Hemoglobin: 14.8 g/dL (ref 12.0–15.0)
Immature Granulocytes: 1 %
Lymphocytes Relative: 23 %
Lymphs Abs: 1.7 10*3/uL (ref 0.7–4.0)
MCH: 29.3 pg (ref 26.0–34.0)
MCHC: 32.2 g/dL (ref 30.0–36.0)
MCV: 91.1 fL (ref 80.0–100.0)
Monocytes Absolute: 0.7 10*3/uL (ref 0.1–1.0)
Monocytes Relative: 10 %
Neutro Abs: 5 10*3/uL (ref 1.7–7.7)
Neutrophils Relative %: 66 %
Platelets: 264 10*3/uL (ref 150–400)
RBC: 5.05 MIL/uL (ref 3.87–5.11)
RDW: 13.1 % (ref 11.5–15.5)
WBC: 7.5 10*3/uL (ref 4.0–10.5)
nRBC: 0 % (ref 0.0–0.2)

## 2020-05-11 LAB — COMPREHENSIVE METABOLIC PANEL
ALT: 22 U/L (ref 0–44)
AST: 28 U/L (ref 15–41)
Albumin: 3.5 g/dL (ref 3.5–5.0)
Alkaline Phosphatase: 83 U/L (ref 38–126)
Anion gap: 11 (ref 5–15)
BUN: 14 mg/dL (ref 8–23)
CO2: 25 mmol/L (ref 22–32)
Calcium: 8.8 mg/dL — ABNORMAL LOW (ref 8.9–10.3)
Chloride: 98 mmol/L (ref 98–111)
Creatinine, Ser: 0.64 mg/dL (ref 0.44–1.00)
GFR, Estimated: >60 mL/min (ref >60)
Glucose, Bld: 132 mg/dL — ABNORMAL HIGH (ref 70–99)
Potassium: 3.5 mmol/L (ref 3.5–5.1)
Sodium: 134 mmol/L — ABNORMAL LOW (ref 135–145)
Total Bilirubin: 0.4 mg/dL (ref 0.3–1.2)
Total Protein: 7.2 g/dL (ref 6.5–8.1)

## 2020-05-11 LAB — POC SARS CORONAVIRUS 2 AG -  ED: SARS Coronavirus 2 Ag: POSITIVE — AB

## 2020-05-11 MED ORDER — SODIUM CHLORIDE 0.9 % IV SOLN: 1200.0000 mg | Freq: Once | INTRAVENOUS | Status: DC

## 2020-05-11 MED ORDER — DIPHENHYDRAMINE HCL 50 MG/ML IJ SOLN: 50.0000 mg | Freq: Once | INTRAMUSCULAR | Status: DC | PRN

## 2020-05-11 MED ORDER — ALBUTEROL SULFATE HFA 108 (90 BASE) MCG/ACT IN AERS: 2.0000 | INHALATION_SPRAY | Freq: Once | RESPIRATORY_TRACT | Status: DC | PRN

## 2020-05-11 MED ORDER — SOTROVIMAB 500 MG/8ML IV SOLN
500.0000 mg | Freq: Once | INTRAVENOUS | Status: AC
  Administered 2020-05-11: 500 mg via INTRAVENOUS
  Filled 2020-05-11: qty 500

## 2020-05-11 MED ORDER — SODIUM CHLORIDE 0.9 % IV BOLUS
500.0000 mL | Freq: Once | INTRAVENOUS | Status: AC
  Administered 2020-05-11: 500 mL via INTRAVENOUS

## 2020-05-11 MED ORDER — FAMOTIDINE IN NACL 20-0.9 MG/50ML-% IV SOLN: 20.0000 mg | Freq: Once | INTRAVENOUS | Status: DC | PRN

## 2020-05-11 MED ORDER — METHYLPREDNISOLONE SODIUM SUCC 125 MG IJ SOLR: 125.0000 mg | Freq: Once | INTRAMUSCULAR | Status: DC | PRN

## 2020-05-11 MED ORDER — SODIUM CHLORIDE 0.9 % IV SOLN: INTRAVENOUS | Status: DC | PRN

## 2020-05-11 MED ORDER — EPINEPHRINE 0.3 MG/0.3ML IJ SOAJ: 0.3000 mg | Freq: Once | INTRAMUSCULAR | Status: DC | PRN

## 2020-05-11 NOTE — ED Triage Notes (Signed)
Patient c/o coughing and diarrhea x2 days. Positive home test today.

## 2020-05-11 NOTE — ED Provider Notes (Signed)
Debra Farmer   CSN: [id number] Arrival date & time: 05/11/20  2111     History Chief Complaint  Patient presents with  . Covid Positive    Debra Farmer is a 74 y.o. female.  HPI Patient presents with Covid 19 requesting monoclonal antibody.  States around 2 days ago she developed cough some shortness of breath and diarrhea.  States she took a home test today that was positive.  States she talked to her sister and was told to come into the ER to get the infusion.  States she called the ER and was told to come in.  Some mild diarrhea.  States she feels fatigued.  Former smoker.  Denies any lung problems but does appear to have diagnosis of COPD.  States she has been coughing a lot and had recent left-sided neck surgery.  States that she was told not to cough this much and that is why she wants the infusion.    Past Medical History:  Diagnosis Date  . Arthritis    RA  . COPD (chronic obstructive pulmonary disease) ([place])   . GERD (gastroesophageal reflux disease)   . Neuromuscular disorder ([place]) 02/05/2015   Bells Palsy, right    Patient Active Problem List   Diagnosis Date Noted  . Cervical myelopathy ([place]) 04/03/2020  . Grief at loss of child 03/20/2020  . Steroid dependence ([place]) 03/20/2020  . Preop exam for internal medicine 03/20/2020  . Bilateral hand numbness 01/24/2020  . Coronary atherosclerosis 09/22/2017  . Chest pain, atypical 09/22/2017  . Primary osteoarthritis of right hip 04/01/2017  . Osteoarthritis of right hip 04/01/2017  . Osteoporosis 03/03/2017  . Decreased libido 10/19/2016  . Spinal stenosis at L4-L5 level 08/26/2016  . 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 Diarrhea 09/23/2015  . Bell's palsy 03/02/2015  . Seronegative rheumatoid arthritis ([place]) 03/02/2015  . Neck pain 04/26/2014  . Herpes zoster 04/05/2014  . Thrush, oral 03/06/2014  . Left arm  swelling 01/15/2013  . Cough 09/19/2012  . Insomnia 09/01/2012  . CTS (carpal tunnel syndrome) 04/15/2012  . Arthritis of multiple sites 03/04/2012  . Well adult exam 01/27/2012  . Knee pain, bilateral 01/08/2012  . Hip pain, right 12/26/2010  . DISCITIS 07/25/2010  . SWEATING 05/23/2010  . PARESTHESIA, HANDS 04/25/2010  . COPD mixed type ([place]) 07/26/2009  . SINUSITIS, ACUTE 08/01/2008  . RASH AND OTHER NONSPECIFIC SKIN ERUPTION 08/01/2008  . TOBACCO USE DISORDER/SMOKER-SMOKING CESSATION DISCUSSED 03/08/2008  . BRONCHITIS, ACUTE 03/08/2008  . HYPERLIPIDEMIA 02/27/2008  . CRAMP OF LIMB 02/27/2008  . FATIGUE 02/27/2008  . Wheezing 02/27/2008  . MENINGITIS 08/11/2007  . GERD 08/11/2007  . HEADACHE 08/11/2007  . OSTEOARTHRITIS 01/11/2007    Past Surgical History:  Procedure Laterality Date  . ABDOMINAL HYSTERECTOMY     partial  . ANTERIOR CERVICAL DECOMPRESSION/DISCECTOMY FUSION 4 LEVELS N/A 04/03/2020   Procedure: ANTERIOR CERVICAL DECOMPRESSION/DISCECTOMY FUSION C3-7;  Surgeon: Melina Schools, MD;  Location: [facility];  Service: Orthopedics;  Laterality: N/A;  5 hrs  . APPENDECTOMY    . CATARACT EXTRACTION Bilateral    about 3 years ago   . CHOLECYSTECTOMY    . KNEE ARTHROSCOPY  1999   Dr Theda Sers -- R knee  . LUMBAR LAMINECTOMY/DECOMPRESSION MICRODISCECTOMY Right 08/26/2016   Procedure: Microlumbar decompression L4-5, L5-S1 on Right;  Surgeon: Susa Day, MD;  Location: [facility];  Service: Orthopedics;  Laterality: Right;  . POSTERIOR CERVICAL FUSION/FORAMINOTOMY  N/A 04/04/2020   Procedure: POSTERIOR SPINAL FUSION INSTRUMENTATION CERVICAL THREE THROUGH SEVEN, CERVICAL THREE LAMINOTOMY;  Surgeon: Melina Schools, MD;  Location: [facility];  Service: Orthopedics;  Laterality: N/A;  5 hrs  . TOTAL HIP ARTHROPLASTY Right 04/01/2017   Procedure: RIGHT TOTAL HIP ARTHROPLASTY ANTERIOR APPROACH;  Surgeon: Rod Can, MD;  Location: [facility];  Service: Orthopedics;  Laterality: Right;  Needs  RNFA     OB History   No obstetric history on file.     Family History  Problem Relation Age of Onset  . Heart disease Mother 20       CHF  . COPD Father 50       emphesema    Social History   Tobacco Use  . Smoking status: Former Smoker    Packs/day: 0.50    Years: 58.00    Pack years: 29.00    Types: Cigarettes    Quit date: 02/15/2018    Years since quitting: 2.2  . Smokeless tobacco: Never Used  Vaping Use  . Vaping Use: Never used  Substance Use Topics  . Alcohol use: No  . Drug use: No    Home Medications Prior to Admission medications   Medication Sig Start Date End Date Taking? Authorizing Provider  Cholecalciferol (VITAMIN D3) 5000 units TABS Take 5,000 Units by mouth in the morning and at bedtime. WITH A GLASS OF VANILLA-HONEY ALMOND MILK    [provider]  leflunomide (ARAVA) 20 MG tablet Take 20 mg by mouth daily.    [provider]  Melatonin 3 MG TABS Take 3 mg by mouth at bedtime as needed (for sleep).    [provider]  Multiple Vitamins-Minerals (MULTIVITAMIN WITH MINERALS) tablet Take 1 tablet by mouth daily. Immune    [provider]  ondansetron (ZOFRAN) 4 MG tablet Take 1 tablet (4 mg total) by mouth every 8 (eight) hours as needed for nausea or vomiting. 04/05/20   Melina Schools, MD  predniSONE (DELTASONE) 1 MG tablet Take 4 tablets (4 mg total) by mouth daily with breakfast. 06/16/17   Plotnikov, Evie Lacks, MD  vitamin B-12 (CYANOCOBALAMIN) 1000 MCG tablet Take 3,000 mcg by mouth in the morning and at bedtime.     [provider]  zolpidem (AMBIEN) 10 MG tablet Take 1 tablet (10 mg total) by mouth at bedtime as needed. for sleep Patient taking differently: Take 10 mg by mouth at bedtime.  03/20/20   Plotnikov, Evie Lacks, MD    Allergies    Aspirin, Codeine sulfate, Lipitor [atorvastatin], Nabumetone, Plaquenil [hydroxychloroquine], Pravastatin, Senna, and Statins  Review of Systems   Review of  Systems  Constitutional: Positive for appetite change and fatigue.  HENT: Positive for congestion.   Respiratory: Positive for cough and shortness of breath.   Gastrointestinal: Positive for diarrhea.  Genitourinary: Negative for flank pain.  Musculoskeletal: Positive for neck pain. Negative for back pain.  Skin: Negative for rash.  Neurological: Positive for weakness.  Psychiatric/Behavioral: Negative for confusion.    Physical Exam Updated Vital Signs BP 123/80   Pulse 94   Resp 18   SpO2 95%   Physical Exam Vitals and nursing Farmer reviewed.  HENT:     Head: Normocephalic.  Eyes:     Pupils: Pupils are equal, round, and reactive to light.  Neck:     Comments: Postsurgical changes in anterior left neck. Cardiovascular:     Rate and Rhythm: Regular rhythm.  Pulmonary:     Comments: Mildly harsh  breath sounds without focal rales or rhonchi.  No respiratory distress. Abdominal:     Tenderness: There is no abdominal tenderness.  Musculoskeletal:     Cervical back: Neck supple.     Right lower leg: No edema.     Left lower leg: No edema.  Skin:    General: Skin is warm.     Capillary Refill: Capillary refill takes less than 2 seconds.  Neurological:     Mental Status: She is alert and oriented to person, place, and time.  Psychiatric:        Mood and Affect: Mood normal.     ED Results / Procedures / Treatments   Labs (all labs ordered are listed, but only abnormal results are displayed) Labs Reviewed  COMPREHENSIVE METABOLIC PANEL - Abnormal; Notable for the following components:      Result Value   Sodium 134 (*)    Glucose, Bld 132 (*)    Calcium 8.8 (*)    All other components within normal limits  POC SARS CORONAVIRUS 2 AG -  ED - Abnormal; Notable for the following components:   SARS Coronavirus 2 Ag POSITIVE (*)    All other components within normal limits  CBC WITH DIFFERENTIAL/PLATELET    EKG None  Radiology No results  found.  Procedures Procedures (including critical care time)  Medications Ordered in ED Medications  0.9 %  sodium chloride infusion (has no administration in time range)  diphenhydrAMINE (BENADRYL) injection 50 mg (has no administration in time range)  famotidine (PEPCID) IVPB 20 mg premix (has no administration in time range)  methylPREDNISolone sodium succinate (SOLU-MEDROL) 125 mg/2 mL injection 125 mg (has no administration in time range)  albuterol (VENTOLIN HFA) 108 (90 Base) MCG/ACT inhaler 2 puff (has no administration in time range)  EPINEPHrine (EPI-PEN) injection 0.3 mg (has no administration in time range)  sotrovimab 500 mg in sodium chloride 0.9 % 108 mL IVPB (500 mg Intravenous New Bag/Given 05/11/20 2250)  sodium chloride 0.9 % bolus 500 mL (500 mLs Intravenous New Bag/Given 05/11/20 2206)    ED Course  I have reviewed the triage vital signs and the nursing notes.  Pertinent labs & imaging results that were available during my care of the patient were reviewed by me and considered in my medical decision making (see chart for details).    MDM Rules/Calculators/A&P                          Patient with COVID-19.  Positive test at home and positive test here.  Not hypoxic.  Mild diarrhea.  Labs reassuring.  Patient is requesting and is a good candidate for monoclonal antibody infusion.  Will be infused here then discharged home if she tolerates well.  Care turned over to Dr. Leonides Schanz Final Clinical Impression(s) / ED Diagnoses Final diagnoses:  [id number]    Rx / DC Orders ED Discharge Orders    None       Davonna Belling, MD 05/11/20 2302

## 2020-05-13 ENCOUNTER — Telehealth: Payer: Self-pay | Admitting: Internal Medicine

## 2020-05-13 NOTE — Telephone Encounter (Signed)
Caller states that she tested pos for COVID today using a home test. Pt had not prev vaccinated against COVID. She is having fatigue. Reports fever of 101.3 oral this morning but she is afebrile now after Tylenol. Denies vomiting but has diarrhea x 3 episodes. No SOB or other breathing difficulty. Normal PO fluid intake and urination. She is wanting to know if she can get the infusion.

## 2020-05-14 NOTE — Telephone Encounter (Signed)
Call the hotline number at [phone]. [facility] Appointments required. No walk-in appointments. Thanks

## 2020-05-15 NOTE — Telephone Encounter (Signed)
Called pt gave her MD response. Pt states she has already had the infusion. She states she called Dr., Alain Marion bcz now she is coughing up greenish mucus and she feel like she need antibiotic. Pt has made appt for tomorrow @ 3:20 w/MD (virtual). Inform pt to just keep appt and he will address sxs at that time. She states she feel ok today, no fever, oxygen and HR been running good.Marland KitchenJohny Chess

## 2020-05-16 ENCOUNTER — Telehealth (INDEPENDENT_AMBULATORY_CARE_PROVIDER_SITE_OTHER): Payer: 59 | Admitting: Internal Medicine

## 2020-05-16 ENCOUNTER — Encounter: Payer: Self-pay | Admitting: Internal Medicine

## 2020-05-16 DIAGNOSIS — M48061 Spinal stenosis, lumbar region without neurogenic claudication: Secondary | ICD-10-CM

## 2020-05-16 DIAGNOSIS — U071 COVID-19: Secondary | ICD-10-CM | POA: Diagnosis not present

## 2020-05-16 DIAGNOSIS — J209 Acute bronchitis, unspecified: Secondary | ICD-10-CM | POA: Diagnosis not present

## 2020-05-16 MED ORDER — CEFDINIR 300 MG PO CAPS: 300.0000 mg | ORAL_CAPSULE | Freq: Two times a day (BID) | ORAL | 0 refills | Status: DC

## 2020-05-16 MED ORDER — BENZONATATE 200 MG PO CAPS: 200.0000 mg | ORAL_CAPSULE | Freq: Two times a day (BID) | ORAL | 1 refills | Status: DC | PRN

## 2020-05-16 MED ORDER — TRAMADOL HCL 50 MG PO TABS: 50.0000 mg | ORAL_TABLET | Freq: Four times a day (QID) | ORAL | 3 refills | Status: DC | PRN

## 2020-05-16 NOTE — Assessment & Plan Note (Signed)
  Omnicef 300 mg bid Tessalon

## 2020-05-16 NOTE — Progress Notes (Signed)
Virtual Visit via Telephone Note  I connected with Debra Farmer on 05/16/20 at  3:20 PM EST by telephone and verified that I am speaking with the correct person using two identifiers.  Location: Patient: home Provider: [facility]   I discussed the limitations, risks, security and privacy concerns of performing an evaluation and management service by telephone and the availability of in person appointments. I also discussed with the patient that there may be a patient responsible charge related to this service. The patient expressed understanding and agreed to proceed.   History of Present Illness:   C/o cough, green mucus, COVID (+) on 11/27. Had infusion - some better. She is recovering from her recent back surgery Needs Tramadol Rx   Observations/Objective:  NAD  Assessment and Plan: See plan  Follow Up Instructions:    I discussed the assessment and treatment plan with the patient. The patient was provided an opportunity to ask questions and all were answered. The patient agreed with the plan and demonstrated an understanding of the instructions.   The patient was advised to call back or seek an in-person evaluation if the symptoms worsen or if the condition fails to improve as anticipated.  I provided 23 minutes of non-face-to-face time during this encounter.   Walker Kehr, MD

## 2020-05-16 NOTE — Assessment & Plan Note (Signed)
S/p Fusion 11/21 Dr Rolena Infante Tramadol prn  Potential benefits of a long term opioids use as well as potential risks (i.e. addiction risk, apnea etc) and complications (i.e. Somnolence, constipation and others) were explained to the patient and were aknowledged.

## 2020-05-16 NOTE — Assessment & Plan Note (Signed)
S/p ab infusion Omnicef 300 mg bid Tessalon

## 2020-05-27 DIAGNOSIS — Z4889 Encounter for other specified surgical aftercare: Secondary | ICD-10-CM | POA: Diagnosis not present

## 2020-06-04 DIAGNOSIS — M0609 Rheumatoid arthritis without rheumatoid factor, multiple sites: Secondary | ICD-10-CM | POA: Diagnosis not present

## 2020-06-18 DIAGNOSIS — M2550 Pain in unspecified joint: Secondary | ICD-10-CM | POA: Diagnosis not present

## 2020-06-18 DIAGNOSIS — Z79899 Other long term (current) drug therapy: Secondary | ICD-10-CM | POA: Diagnosis not present

## 2020-06-18 DIAGNOSIS — M0609 Rheumatoid arthritis without rheumatoid factor, multiple sites: Secondary | ICD-10-CM | POA: Diagnosis not present

## 2020-06-18 DIAGNOSIS — Z6826 Body mass index (BMI) 26.0-26.9, adult: Secondary | ICD-10-CM | POA: Diagnosis not present

## 2020-06-18 DIAGNOSIS — M150 Primary generalized (osteo)arthritis: Secondary | ICD-10-CM | POA: Diagnosis not present

## 2020-06-18 DIAGNOSIS — R5383 Other fatigue: Secondary | ICD-10-CM | POA: Diagnosis not present

## 2020-06-18 DIAGNOSIS — E663 Overweight: Secondary | ICD-10-CM | POA: Diagnosis not present

## 2020-07-18 DIAGNOSIS — M961 Postlaminectomy syndrome, not elsewhere classified: Secondary | ICD-10-CM | POA: Diagnosis not present

## 2020-07-18 DIAGNOSIS — M5416 Radiculopathy, lumbar region: Secondary | ICD-10-CM | POA: Diagnosis not present

## 2020-07-19 ENCOUNTER — Inpatient Hospital Stay (HOSPITAL_COMMUNITY): Payer: Medicare Other

## 2020-07-19 ENCOUNTER — Encounter (HOSPITAL_COMMUNITY)
Admission: EM | Disposition: A | Discharge status: Home / Self Care | Payer: Self-pay | Source: Home / Self Care | Attending: Cardiovascular Disease

## 2020-07-19 ENCOUNTER — Emergency Department (HOSPITAL_COMMUNITY): Payer: Medicare Other

## 2020-07-19 ENCOUNTER — Inpatient Hospital Stay (HOSPITAL_COMMUNITY)
Admission: EM | Admit: 2020-07-19 | Discharge: 2020-07-21 | DRG: 247 | Disposition: A | Discharge status: Home / Self Care | Payer: Medicare Other | Attending: Cardiovascular Disease | Admitting: Cardiovascular Disease

## 2020-07-19 DIAGNOSIS — Z886 Allergy status to analgesic agent status: Secondary | ICD-10-CM | POA: Diagnosis not present

## 2020-07-19 DIAGNOSIS — R739 Hyperglycemia, unspecified: Secondary | ICD-10-CM | POA: Diagnosis present

## 2020-07-19 DIAGNOSIS — M069 Rheumatoid arthritis, unspecified: Secondary | ICD-10-CM | POA: Diagnosis present

## 2020-07-19 DIAGNOSIS — Z791 Long term (current) use of non-steroidal anti-inflammatories (NSAID): Secondary | ICD-10-CM

## 2020-07-19 DIAGNOSIS — Z888 Allergy status to other drugs, medicaments and biological substances status: Secondary | ICD-10-CM

## 2020-07-19 DIAGNOSIS — Z79899 Other long term (current) drug therapy: Secondary | ICD-10-CM | POA: Diagnosis not present

## 2020-07-19 DIAGNOSIS — Z885 Allergy status to narcotic agent status: Secondary | ICD-10-CM

## 2020-07-19 DIAGNOSIS — Z825 Family history of asthma and other chronic lower respiratory diseases: Secondary | ICD-10-CM

## 2020-07-19 DIAGNOSIS — I214 Non-ST elevation (NSTEMI) myocardial infarction: Secondary | ICD-10-CM | POA: Diagnosis present

## 2020-07-19 DIAGNOSIS — Z96641 Presence of right artificial hip joint: Secondary | ICD-10-CM | POA: Diagnosis present

## 2020-07-19 DIAGNOSIS — Z87891 Personal history of nicotine dependence: Secondary | ICD-10-CM

## 2020-07-19 DIAGNOSIS — I249 Acute ischemic heart disease, unspecified: Secondary | ICD-10-CM | POA: Diagnosis present

## 2020-07-19 DIAGNOSIS — Z8249 Family history of ischemic heart disease and other diseases of the circulatory system: Secondary | ICD-10-CM | POA: Diagnosis not present

## 2020-07-19 DIAGNOSIS — I350 Nonrheumatic aortic (valve) stenosis: Secondary | ICD-10-CM | POA: Diagnosis present

## 2020-07-19 DIAGNOSIS — J449 Chronic obstructive pulmonary disease, unspecified: Secondary | ICD-10-CM | POA: Diagnosis present

## 2020-07-19 DIAGNOSIS — Z981 Arthrodesis status: Secondary | ICD-10-CM

## 2020-07-19 DIAGNOSIS — R000 Tachycardia, unspecified: Secondary | ICD-10-CM | POA: Diagnosis not present

## 2020-07-19 DIAGNOSIS — J9811 Atelectasis: Secondary | ICD-10-CM | POA: Diagnosis present

## 2020-07-19 DIAGNOSIS — E785 Hyperlipidemia, unspecified: Secondary | ICD-10-CM | POA: Diagnosis present

## 2020-07-19 DIAGNOSIS — I25110 Atherosclerotic heart disease of native coronary artery with unstable angina pectoris: Secondary | ICD-10-CM | POA: Diagnosis present

## 2020-07-19 DIAGNOSIS — K219 Gastro-esophageal reflux disease without esophagitis: Secondary | ICD-10-CM | POA: Diagnosis present

## 2020-07-19 DIAGNOSIS — I219 Acute myocardial infarction, unspecified: Secondary | ICD-10-CM

## 2020-07-19 DIAGNOSIS — Z20822 Contact with and (suspected) exposure to covid-19: Secondary | ICD-10-CM | POA: Diagnosis present

## 2020-07-19 DIAGNOSIS — Z7982 Long term (current) use of aspirin: Secondary | ICD-10-CM | POA: Diagnosis not present

## 2020-07-19 DIAGNOSIS — I2510 Atherosclerotic heart disease of native coronary artery without angina pectoris: Secondary | ICD-10-CM | POA: Diagnosis not present

## 2020-07-19 DIAGNOSIS — E876 Hypokalemia: Secondary | ICD-10-CM | POA: Diagnosis not present

## 2020-07-19 HISTORY — PX: LEFT HEART CATH AND CORONARY ANGIOGRAPHY: CATH118249

## 2020-07-19 HISTORY — DX: Acute myocardial infarction, unspecified: I21.9

## 2020-07-19 HISTORY — PX: CORONARY STENT INTERVENTION: CATH118234

## 2020-07-19 LAB — CBC WITH DIFFERENTIAL/PLATELET
Abs Immature Granulocytes: 0.01 10*3/uL (ref 0.00–0.07)
Basophils Absolute: 0 10*3/uL (ref 0.0–0.1)
Basophils Relative: 1 %
Eosinophils Absolute: 0.1 10*3/uL (ref 0.0–0.5)
Eosinophils Relative: 2 %
HCT: 42.7 % (ref 36.0–46.0)
Hemoglobin: 13.4 g/dL (ref 12.0–15.0)
Immature Granulocytes: 0 %
Lymphocytes Relative: 29 %
Lymphs Abs: 1.2 10*3/uL (ref 0.7–4.0)
MCH: 28.3 pg (ref 26.0–34.0)
MCHC: 31.4 g/dL (ref 30.0–36.0)
MCV: 90.3 fL (ref 80.0–100.0)
Monocytes Absolute: 0.4 10*3/uL (ref 0.1–1.0)
Monocytes Relative: 11 %
Neutro Abs: 2.4 10*3/uL (ref 1.7–7.7)
Neutrophils Relative %: 57 %
Platelets: 179 10*3/uL (ref 150–400)
RBC: 4.73 MIL/uL (ref 3.87–5.11)
RDW: 14.1 % (ref 11.5–15.5)
WBC: 4.1 10*3/uL (ref 4.0–10.5)
nRBC: 0 % (ref 0.0–0.2)

## 2020-07-19 LAB — COMPREHENSIVE METABOLIC PANEL
ALT: 31 U/L (ref 0–44)
AST: 26 U/L (ref 15–41)
Albumin: 3.4 g/dL — ABNORMAL LOW (ref 3.5–5.0)
Alkaline Phosphatase: 68 U/L (ref 38–126)
Anion gap: 10 (ref 5–15)
BUN: 11 mg/dL (ref 8–23)
CO2: 26 mmol/L (ref 22–32)
Calcium: 8.9 mg/dL (ref 8.9–10.3)
Chloride: 102 mmol/L (ref 98–111)
Creatinine, Ser: 0.63 mg/dL (ref 0.44–1.00)
GFR, Estimated: >60 mL/min (ref >60)
Glucose, Bld: 147 mg/dL — ABNORMAL HIGH (ref 70–99)
Potassium: 3.1 mmol/L — ABNORMAL LOW (ref 3.5–5.1)
Sodium: 138 mmol/L (ref 135–145)
Total Bilirubin: 0.5 mg/dL (ref 0.3–1.2)
Total Protein: 6 g/dL — ABNORMAL LOW (ref 6.5–8.1)

## 2020-07-19 LAB — TROPONIN I (HIGH SENSITIVITY)
Troponin I (High Sensitivity): 76 ng/L — ABNORMAL HIGH (ref <18)
Troponin I (High Sensitivity): 288 ng/L (ref <18)
Troponin I (High Sensitivity): 2139 ng/L (ref <18)
Troponin I (High Sensitivity): 2175 ng/L (ref <18)

## 2020-07-19 LAB — ECHOCARDIOGRAM COMPLETE
AR max vel: 1.13 cm2
AV Area VTI: 1.38 cm2
AV Area mean vel: 1.15 cm2
AV Mean grad: 9 mmHg
AV Peak grad: 17.8 mmHg
Ao pk vel: 2.11 m/s
Area-P 1/2: 2.73 cm2
Height: 60 in
MV VTI: 1.76 cm2
P 1/2 time: 347 msec
S' Lateral: 2.5 cm
Weight: 2112 oz

## 2020-07-19 LAB — LIPID PANEL
Cholesterol: 272 mg/dL — ABNORMAL HIGH (ref 0–200)
HDL: 44 mg/dL (ref >40)
LDL Cholesterol: 183 mg/dL — ABNORMAL HIGH (ref 0–99)
Total CHOL/HDL Ratio: 6.2 RATIO
Triglycerides: 224 mg/dL — ABNORMAL HIGH (ref <150)
VLDL: 45 mg/dL — ABNORMAL HIGH (ref 0–40)

## 2020-07-19 LAB — POCT ACTIVATED CLOTTING TIME
Activated Clotting Time: 333 seconds
Activated Clotting Time: 404 seconds
Activated Clotting Time: 333 seconds
Activated Clotting Time: 374 seconds

## 2020-07-19 LAB — SARS CORONAVIRUS 2 (TAT 6-24 HRS): SARS Coronavirus 2: NEGATIVE

## 2020-07-19 SURGERY — LEFT HEART CATH AND CORONARY ANGIOGRAPHY
Anesthesia: LOCAL

## 2020-07-19 MED ORDER — MIDAZOLAM HCL 2 MG/2ML IJ SOLN
INTRAMUSCULAR | Status: AC
  Filled 2020-07-19: qty 2

## 2020-07-19 MED ORDER — SODIUM CHLORIDE 0.9 % IV SOLN: 250.0000 mL | INTRAVENOUS | Status: DC | PRN

## 2020-07-19 MED ORDER — IOHEXOL 350 MG/ML SOLN
INTRAVENOUS | Status: DC | PRN
  Administered 2020-07-19: 125 mL via INTRA_ARTERIAL

## 2020-07-19 MED ORDER — NITROGLYCERIN IN D5W 200-5 MCG/ML-% IV SOLN: 10.0000 ug/min | INTRAVENOUS | Status: DC

## 2020-07-19 MED ORDER — VERAPAMIL HCL 2.5 MG/ML IV SOLN
INTRAVENOUS | Status: AC
  Filled 2020-07-19: qty 2

## 2020-07-19 MED ORDER — NITROGLYCERIN 0.4 MG SL SUBL
0.4000 mg | SUBLINGUAL_TABLET | SUBLINGUAL | Status: DC | PRN
  Administered 2020-07-19 (×2): 0.4 mg via SUBLINGUAL
  Filled 2020-07-19: qty 1

## 2020-07-19 MED ORDER — ONDANSETRON HCL 4 MG/2ML IJ SOLN: 4.0000 mg | Freq: Four times a day (QID) | INTRAMUSCULAR | Status: DC | PRN

## 2020-07-19 MED ORDER — TRAMADOL HCL 50 MG PO TABS
50.0000 mg | ORAL_TABLET | Freq: Four times a day (QID) | ORAL | Status: DC | PRN
  Administered 2020-07-19 – 2020-07-20 (×5): 50 mg via ORAL
  Filled 2020-07-19 (×5): qty 1

## 2020-07-19 MED ORDER — FENTANYL CITRATE (PF) 100 MCG/2ML IJ SOLN
INTRAMUSCULAR | Status: AC
  Filled 2020-07-19: qty 2

## 2020-07-19 MED ORDER — HEPARIN SODIUM (PORCINE) 1000 UNIT/ML IJ SOLN
INTRAMUSCULAR | Status: DC | PRN
  Administered 2020-07-19: 5000 [IU] via INTRAVENOUS
  Administered 2020-07-19: 4000 [IU] via INTRAVENOUS

## 2020-07-19 MED ORDER — POTASSIUM CHLORIDE CRYS ER 10 MEQ PO TBCR
20.0000 meq | EXTENDED_RELEASE_TABLET | Freq: Once | ORAL | Status: AC
  Administered 2020-07-19: 20 meq via ORAL
  Filled 2020-07-19: qty 2

## 2020-07-19 MED ORDER — SODIUM CHLORIDE 0.9% FLUSH
3.0000 mL | Freq: Two times a day (BID) | INTRAVENOUS | Status: DC
  Administered 2020-07-20 – 2020-07-21 (×2): 3 mL via INTRAVENOUS

## 2020-07-19 MED ORDER — HEPARIN SODIUM (PORCINE) 1000 UNIT/ML IJ SOLN
INTRAMUSCULAR | Status: AC
  Filled 2020-07-19: qty 1

## 2020-07-19 MED ORDER — LABETALOL HCL 5 MG/ML IV SOLN: 10.0000 mg | INTRAVENOUS | Status: AC | PRN

## 2020-07-19 MED ORDER — ONDANSETRON HCL 4 MG/2ML IJ SOLN
INTRAMUSCULAR | Status: AC
  Filled 2020-07-19: qty 2

## 2020-07-19 MED ORDER — MIDAZOLAM HCL 2 MG/2ML IJ SOLN
INTRAMUSCULAR | Status: DC | PRN
  Administered 2020-07-19: 1 mg via INTRAVENOUS

## 2020-07-19 MED ORDER — LIDOCAINE HCL (PF) 1 % IJ SOLN
INTRAMUSCULAR | Status: AC
  Filled 2020-07-19: qty 30

## 2020-07-19 MED ORDER — TICAGRELOR 90 MG PO TABS
ORAL_TABLET | ORAL | Status: AC
  Filled 2020-07-19: qty 1

## 2020-07-19 MED ORDER — HEPARIN BOLUS VIA INFUSION
3500.0000 [IU] | Freq: Once | INTRAVENOUS | Status: AC
  Administered 2020-07-19: 3500 [IU] via INTRAVENOUS
  Filled 2020-07-19: qty 3500

## 2020-07-19 MED ORDER — SODIUM CHLORIDE 0.9 % IV SOLN: INTRAVENOUS | Status: DC

## 2020-07-19 MED ORDER — HEPARIN (PORCINE) IN NACL 1000-0.9 UT/500ML-% IV SOLN
INTRAVENOUS | Status: AC
  Filled 2020-07-19: qty 500

## 2020-07-19 MED ORDER — FENTANYL CITRATE (PF) 100 MCG/2ML IJ SOLN
INTRAMUSCULAR | Status: DC | PRN
  Administered 2020-07-19 (×3): 25 ug via INTRAVENOUS

## 2020-07-19 MED ORDER — NITROGLYCERIN 1 MG/10 ML FOR IR/CATH LAB
INTRA_ARTERIAL | Status: DC | PRN
  Administered 2020-07-19 (×2): 200 ug via INTRACORONARY

## 2020-07-19 MED ORDER — FENTANYL CITRATE (PF) 100 MCG/2ML IJ SOLN
INTRAMUSCULAR | Status: DC | PRN
  Administered 2020-07-19: 50 ug via INTRAVENOUS

## 2020-07-19 MED ORDER — ACETAMINOPHEN 325 MG PO TABS
650.0000 mg | ORAL_TABLET | ORAL | Status: DC | PRN
  Administered 2020-07-20: 650 mg via ORAL
  Filled 2020-07-19: qty 2

## 2020-07-19 MED ORDER — HEPARIN (PORCINE) IN NACL 1000-0.9 UT/500ML-% IV SOLN
INTRAVENOUS | Status: DC | PRN
  Administered 2020-07-19 (×2): 500 mL

## 2020-07-19 MED ORDER — HEPARIN (PORCINE) 25000 UT/250ML-% IV SOLN
800.0000 [IU]/h | INTRAVENOUS | Status: DC
  Administered 2020-07-19: 800 [IU]/h via INTRAVENOUS
  Filled 2020-07-19: qty 250

## 2020-07-19 MED ORDER — IOHEXOL 350 MG/ML SOLN
INTRAVENOUS | Status: DC | PRN
  Administered 2020-07-19: 75 mL via INTRA_ARTERIAL

## 2020-07-19 MED ORDER — NITROGLYCERIN 1 MG/10 ML FOR IR/CATH LAB
INTRA_ARTERIAL | Status: AC
  Filled 2020-07-19: qty 10

## 2020-07-19 MED ORDER — TICAGRELOR 90 MG PO TABS
90.0000 mg | ORAL_TABLET | Freq: Two times a day (BID) | ORAL | Status: DC
  Administered 2020-07-19 – 2020-07-21 (×4): 90 mg via ORAL
  Filled 2020-07-19 (×4): qty 1

## 2020-07-19 MED ORDER — CHLORHEXIDINE GLUCONATE CLOTH 2 % EX PADS
6.0000 | MEDICATED_PAD | Freq: Every day | CUTANEOUS | Status: DC
  Administered 2020-07-20: 6 via TOPICAL

## 2020-07-19 MED ORDER — VERAPAMIL HCL 2.5 MG/ML IV SOLN
INTRAVENOUS | Status: DC | PRN
  Administered 2020-07-19: 10 mL via INTRA_ARTERIAL

## 2020-07-19 MED ORDER — CARVEDILOL 3.125 MG PO TABS
3.1250 mg | ORAL_TABLET | Freq: Two times a day (BID) | ORAL | Status: DC
  Administered 2020-07-19 – 2020-07-20 (×2): 3.125 mg via ORAL
  Filled 2020-07-19 (×2): qty 1

## 2020-07-19 MED ORDER — ASPIRIN EC 81 MG PO TBEC: 81.0000 mg | DELAYED_RELEASE_TABLET | Freq: Every day | ORAL | Status: DC

## 2020-07-19 MED ORDER — MORPHINE SULFATE (PF) 4 MG/ML IV SOLN
4.0000 mg | Freq: Once | INTRAVENOUS | Status: AC
  Administered 2020-07-19: 4 mg via INTRAVENOUS
  Filled 2020-07-19: qty 1

## 2020-07-19 MED ORDER — ASPIRIN EC 81 MG PO TBEC
81.0000 mg | DELAYED_RELEASE_TABLET | Freq: Every day | ORAL | Status: DC
  Administered 2020-07-19 – 2020-07-21 (×3): 81 mg via ORAL
  Filled 2020-07-19 (×3): qty 1

## 2020-07-19 MED ORDER — ASPIRIN 81 MG PO CHEW
81.0000 mg | CHEWABLE_TABLET | Freq: Every day | ORAL | Status: DC
Start: 2020-07-19 — End: 2020-07-19

## 2020-07-19 MED ORDER — SODIUM CHLORIDE 0.9 % WEIGHT BASED INFUSION
1.3000 mL/kg/h | INTRAVENOUS | Status: AC
  Administered 2020-07-19: 1.3 mL/kg/h via INTRAVENOUS

## 2020-07-19 MED ORDER — ACETAMINOPHEN 325 MG PO TABS: 650.0000 mg | ORAL_TABLET | ORAL | Status: DC | PRN

## 2020-07-19 MED ORDER — LIDOCAINE HCL (PF) 1 % IJ SOLN
INTRAMUSCULAR | Status: DC | PRN
  Administered 2020-07-19: 2 mL

## 2020-07-19 MED ORDER — HEPARIN (PORCINE) IN NACL 1000-0.9 UT/500ML-% IV SOLN
INTRAVENOUS | Status: DC | PRN
  Administered 2020-07-19: 500 mL

## 2020-07-19 MED ORDER — TICAGRELOR 90 MG PO TABS
ORAL_TABLET | ORAL | Status: DC | PRN
  Administered 2020-07-19: 180 mg via ORAL

## 2020-07-19 MED ORDER — NITROGLYCERIN IN D5W 200-5 MCG/ML-% IV SOLN
INTRAVENOUS | Status: AC | PRN
  Administered 2020-07-19: 5 ug/min via INTRAVENOUS

## 2020-07-19 MED ORDER — ONDANSETRON HCL 4 MG/2ML IJ SOLN
INTRAMUSCULAR | Status: DC | PRN
  Administered 2020-07-19: 4 mg via INTRAVENOUS

## 2020-07-19 MED ORDER — HEPARIN SODIUM (PORCINE) 1000 UNIT/ML IJ SOLN
INTRAMUSCULAR | Status: DC | PRN
  Administered 2020-07-19: 5000 [IU] via INTRAVENOUS

## 2020-07-19 MED ORDER — NITROGLYCERIN IN D5W 200-5 MCG/ML-% IV SOLN
INTRAVENOUS | Status: AC
  Filled 2020-07-19: qty 250

## 2020-07-19 MED ORDER — FENTANYL CITRATE (PF) 100 MCG/2ML IJ SOLN
INTRAMUSCULAR | Status: DC | PRN
  Administered 2020-07-19: 25 ug via INTRAVENOUS

## 2020-07-19 MED ORDER — NITROGLYCERIN 2 % TD OINT
1.0000 [in_us] | TOPICAL_OINTMENT | Freq: Once | TRANSDERMAL | Status: AC
  Administered 2020-07-19: 1 [in_us] via TOPICAL
  Filled 2020-07-19: qty 1

## 2020-07-19 MED ORDER — SODIUM CHLORIDE 0.9% FLUSH: 3.0000 mL | INTRAVENOUS | Status: DC | PRN

## 2020-07-19 MED ORDER — HEPARIN SODIUM (PORCINE) 5000 UNIT/ML IJ SOLN
5000.0000 [IU] | Freq: Three times a day (TID) | INTRAMUSCULAR | Status: DC
  Administered 2020-07-19 – 2020-07-20 (×4): 5000 [IU] via SUBCUTANEOUS
  Filled 2020-07-19 (×4): qty 1

## 2020-07-19 MED ORDER — HYDRALAZINE HCL 20 MG/ML IJ SOLN: 10.0000 mg | INTRAMUSCULAR | Status: AC | PRN

## 2020-07-19 MED ORDER — HEPARIN (PORCINE) IN NACL 1000-0.9 UT/500ML-% IV SOLN
INTRAVENOUS | Status: AC
  Filled 2020-07-19: qty 1500

## 2020-07-19 SURGICAL SUPPLY — 28 items
BAG SNAP BAND KOVER 36X36 (MISCELLANEOUS) ×1 IMPLANT
BALLN SAPPHIRE 2.5X12 (BALLOONS) ×2
BALLN SAPPHIRE 3.5X15 (BALLOONS) ×2
BALLN SAPPHIRE ~~LOC~~ 3.5X12 (BALLOONS) ×1 IMPLANT
BALLOON SAPPHIRE 2.5X12 (BALLOONS) IMPLANT
BALLOON SAPPHIRE 3.5X15 (BALLOONS) IMPLANT
CATH 5FR JL3.5 JR4 ANG PIG MP (CATHETERS) ×1 IMPLANT
CATH INFINITI 5FR AL1 (CATHETERS) ×1 IMPLANT
CATH INFINITI 5FR MPB2 (CATHETERS) ×1 IMPLANT
CATH LAUNCHER 6FR EBU3.5 (CATHETERS) ×1 IMPLANT
CATH LAUNCHER 6FR JR4 (CATHETERS) ×1 IMPLANT
CATH TELESCOPE 6F GEC (CATHETERS) ×1 IMPLANT
CATH VISTA GUIDE 6FR JL3.5 (CATHETERS) ×1 IMPLANT
CATH VISTA GUIDE 6FR XB3 (CATHETERS) ×1 IMPLANT
COVER DOME SNAP 22 D (MISCELLANEOUS) ×1 IMPLANT
DEVICE RAD COMP TR BAND LRG (VASCULAR PRODUCTS) ×1 IMPLANT
GLIDESHEATH SLEND SS 6F .021 (SHEATH) ×1 IMPLANT
GUIDEWIRE INQWIRE 1.5J.035X260 (WIRE) IMPLANT
INQWIRE 1.5J .035X260CM (WIRE) ×2
KIT ENCORE 26 ADVANTAGE (KITS) ×1 IMPLANT
KIT HEART LEFT (KITS) ×2 IMPLANT
PACK CARDIAC CATHETERIZATION (CUSTOM PROCEDURE TRAY) ×2 IMPLANT
SHEATH PROBE COVER 6X72 (BAG) ×1 IMPLANT
STENT RESOLUTE ONYX 2.5X12 (Permanent Stent) ×1 IMPLANT
STENT RESOLUTE ONYX 3.5X30 (Permanent Stent) ×1 IMPLANT
SYR MEDRAD MARK 7 150ML (SYRINGE) ×2 IMPLANT
TRANSDUCER W/STOPCOCK (MISCELLANEOUS) ×2 IMPLANT
WIRE ASAHI PROWATER 180CM (WIRE) ×1 IMPLANT

## 2020-07-19 NOTE — Progress Notes (Signed)
  Echocardiogram 2D Echocardiogram has been performed.  Debra Farmer 07/19/2020, 11:58 AM

## 2020-07-19 NOTE — CV Procedure (Signed)
   PCI and stent of segmental 85 to 90% mid RCA.  TIMI grade III flow pre and post procedure.  A 3.5 x 30 Onyx stent was placed and postdilated with a 3.5 balloon to 14 atm throughout the stent length.  PCI and stent of the mid circumflex 90% stenosis with TIMI grade II flow to 0% with TIMI grade III flow following stent with a 2.5 x 12 mm Onyx.  Procedure was difficult because we were unable to find guide catheters to fit the left main.  Ultimately had to use a 3.5 JL guide catheter which then required a guide extension (telescope) to deliver stent around a right angle circumflex margin.  Post procedure the patient continued to have significant chest pain.  We did not document any areas of reduced flow.  IV nitroglycerin was started.  She will be placed in the intensive care unit for further management.

## 2020-07-19 NOTE — ED Provider Notes (Signed)
[facility] EMERGENCY DEPARTMENT Provider Note   CSN: [id number] Arrival date & time: 07/19/20  [id number]     History No chief complaint on file.   Debra Farmer is a 75 y.o. female.  Patient is a 75 year old female with past medical history of COPD, rheumatoid arthritis, and GERD.  She presents today for evaluation of chest pain.  Patient states that approximately 2 AM, she began with severe pain to the left upper chest that woke her up from sleep.  She describes it as something sitting on her chest.  The pain radiated into her left arm and was associated with some nausea, but no shortness of breath or diaphoresis.  Patient's pain was significant initially, but did improve after she received fentanyl by EMS.  Patient has no prior cardiac history other than an unremarkable stress test in the past.    The history is provided by the patient.       Past Medical History:  Diagnosis Date  . Arthritis    RA  . COPD (chronic obstructive pulmonary disease) ([place])   . GERD (gastroesophageal reflux disease)   . Neuromuscular disorder ([place]) 02/05/2015   Bells Palsy, right    Patient Active Problem List   Diagnosis Date Noted  . COVID-19 05/16/2020  . Cervical myelopathy ([place]) 04/03/2020  . Grief at loss of child 03/20/2020  . Steroid dependence ([place]) 03/20/2020  . Preop exam for internal medicine 03/20/2020  . Bilateral hand numbness 01/24/2020  . Coronary atherosclerosis 09/22/2017  . Chest pain, atypical 09/22/2017  . Primary osteoarthritis of right hip 04/01/2017  . Osteoarthritis of right hip 04/01/2017  . Osteoporosis 03/03/2017  . Decreased libido 10/19/2016  . Spinal stenosis at L4-L5 level 08/26/2016  . 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 Diarrhea 09/23/2015  . Bell's palsy 03/02/2015  . Seronegative rheumatoid arthritis ([place]) 03/02/2015  . Neck pain 04/26/2014  . Herpes zoster 04/05/2014  . Thrush, oral  03/06/2014  . Left arm swelling 01/15/2013  . Cough 09/19/2012  . Insomnia 09/01/2012  . CTS (carpal tunnel syndrome) 04/15/2012  . Arthritis of multiple sites 03/04/2012  . Well adult exam 01/27/2012  . Knee pain, bilateral 01/08/2012  . Hip pain, right 12/26/2010  . DISCITIS 07/25/2010  . SWEATING 05/23/2010  . PARESTHESIA, HANDS 04/25/2010  . COPD mixed type ([place]) 07/26/2009  . SINUSITIS, ACUTE 08/01/2008  . RASH AND OTHER NONSPECIFIC SKIN ERUPTION 08/01/2008  . TOBACCO USE DISORDER/SMOKER-SMOKING CESSATION DISCUSSED 03/08/2008  . BRONCHITIS, ACUTE 03/08/2008  . HYPERLIPIDEMIA 02/27/2008  . CRAMP OF LIMB 02/27/2008  . FATIGUE 02/27/2008  . Wheezing 02/27/2008  . MENINGITIS 08/11/2007  . GERD 08/11/2007  . HEADACHE 08/11/2007  . OSTEOARTHRITIS 01/11/2007    Past Surgical History:  Procedure Laterality Date  . ABDOMINAL HYSTERECTOMY     partial  . ANTERIOR CERVICAL DECOMPRESSION/DISCECTOMY FUSION 4 LEVELS N/A 04/03/2020   Procedure: ANTERIOR CERVICAL DECOMPRESSION/DISCECTOMY FUSION C3-7;  Surgeon: Melina Schools, MD;  Location: [facility];  Service: Orthopedics;  Laterality: N/A;  5 hrs  . APPENDECTOMY    . CATARACT EXTRACTION Bilateral    about 3 years ago   . CHOLECYSTECTOMY    . KNEE ARTHROSCOPY  1999   Dr Theda Sers -- R knee  . LUMBAR LAMINECTOMY/DECOMPRESSION MICRODISCECTOMY Right 08/26/2016   Procedure: Microlumbar decompression L4-5, L5-S1 on Right;  Surgeon: Susa Day, MD;  Location[facility];  Service: Orthopedics;  Laterality: Right;  . POSTERIOR CERVICAL FUSION/FORAMINOTOMY N/A 04/04/2020  Procedure: POSTERIOR SPINAL FUSION INSTRUMENTATION CERVICAL THREE THROUGH SEVEN, CERVICAL THREE LAMINOTOMY;  Surgeon: Melina Schools, MD;  Location: [facility];  Service: Orthopedics;  Laterality: N/A;  5 hrs  . TOTAL HIP ARTHROPLASTY Right 04/01/2017   Procedure: RIGHT TOTAL HIP ARTHROPLASTY ANTERIOR APPROACH;  Surgeon: Rod Can, MD;  Location[facility];  Service: Orthopedics;   Laterality: Right;  Needs RNFA     OB History   No obstetric history on file.     Family History  Problem Relation Age of Onset  . Heart disease Mother 36       CHF  . COPD Father 84       emphesema    Social History   Tobacco Use  . Smoking status: Former Smoker    Packs/day: 0.50    Years: 58.00    Pack years: 29.00    Types: Cigarettes    Quit date: 02/15/2018    Years since quitting: 2.4  . Smokeless tobacco: Never Used  Vaping Use  . Vaping Use: Never used  Substance Use Topics  . Alcohol use: No  . Drug use: No    Home Medications Prior to Admission medications   Medication Sig Start Date End Date Taking? Authorizing Provider  benzonatate (TESSALON) 200 MG capsule Take 1 capsule (200 mg total) by mouth 2 (two) times daily as needed for cough. 05/16/20   Plotnikov, Evie Lacks, MD  cefdinir (OMNICEF) 300 MG capsule Take 1 capsule (300 mg total) by mouth 2 (two) times daily. 05/16/20   Plotnikov, Evie Lacks, MD  Cholecalciferol (VITAMIN D3) 5000 units TABS Take 5,000 Units by mouth in the morning and at bedtime. WITH A GLASS OF VANILLA-HONEY ALMOND MILK    [provider]  leflunomide (ARAVA) 20 MG tablet Take 20 mg by mouth daily.    [provider]  Melatonin 3 MG TABS Take 3 mg by mouth at bedtime as needed (for sleep).    [provider]  Multiple Vitamins-Minerals (MULTIVITAMIN WITH MINERALS) tablet Take 1 tablet by mouth daily. Immune    [provider]  ondansetron (ZOFRAN) 4 MG tablet Take 1 tablet (4 mg total) by mouth every 8 (eight) hours as needed for nausea or vomiting. Patient not taking: Reported on 05/11/2020 04/05/20   Melina Schools, MD  predniSONE (DELTASONE) 1 MG tablet Take 4 tablets (4 mg total) by mouth daily with breakfast. 06/16/17   Plotnikov, Evie Lacks, MD  traMADol (ULTRAM) 50 MG tablet Take 1 tablet (50 mg total) by mouth every 6 (six) hours as needed. 05/16/20   Plotnikov, Evie Lacks, MD  vitamin B-12  (CYANOCOBALAMIN) 1000 MCG tablet Take 3,000 mcg by mouth in the morning and at bedtime.     [provider]  zolpidem (AMBIEN) 10 MG tablet Take 1 tablet (10 mg total) by mouth at bedtime as needed. for sleep Patient taking differently: Take 10 mg by mouth at bedtime.  03/20/20   Plotnikov, Evie Lacks, MD    Allergies    Aspirin, Codeine sulfate, Lipitor [atorvastatin], Nabumetone, Plaquenil [hydroxychloroquine], Pravastatin, Senna, and Statins  Review of Systems   Review of Systems  All other systems reviewed and are negative.   Physical Exam Updated Vital Signs BP (!) 172/70 (BP Location: Right Arm)   Pulse (!) 50   Temp 97.6 F (36.4 C) (Oral)   Resp 16   Ht 5' (1.524 m)   Wt 59.9 kg   SpO2 95%   BMI 25.78 kg/m   Physical Exam  Vitals and nursing note reviewed.  Constitutional:      General: She is not in acute distress.    Appearance: She is well-developed and well-nourished. She is not diaphoretic.  HENT:     Head: Normocephalic and atraumatic.  Cardiovascular:     Rate and Rhythm: Normal rate and regular rhythm.     Heart sounds: No murmur heard. No friction rub. No gallop.   Pulmonary:     Effort: Pulmonary effort is normal. No respiratory distress.     Breath sounds: Normal breath sounds. No wheezing.  Abdominal:     General: Bowel sounds are normal. There is no distension.     Palpations: Abdomen is soft.     Tenderness: There is no abdominal tenderness.  Musculoskeletal:        General: No swelling or tenderness. Normal range of motion.     Cervical back: Normal range of motion and neck supple.     Right lower leg: No edema.     Left lower leg: No edema.  Skin:    General: Skin is warm and dry.  Neurological:     Mental Status: She is alert and oriented to person, place, and time.     ED Results / Procedures / Treatments   Labs (all labs ordered are listed, but only abnormal results are displayed) Labs Reviewed  COMPREHENSIVE METABOLIC PANEL   CBC WITH DIFFERENTIAL/PLATELET  TROPONIN I (HIGH SENSITIVITY)    EKG EKG Interpretation  Date/Time:  Friday July 19 2020 04:23:31 EST Ventricular Rate:  62 PR Interval:    QRS Duration: 74 QT Interval:  446 QTC Calculation: 453 R Axis:   16 Text Interpretation: Sinus rhythm Low voltage, precordial leads Borderline T abnormalities, anterior leads Confirmed by Veryl Speak [phone]) on 07/19/2020 4:26:52 AM   Radiology No results found.  Procedures Procedures   Medications Ordered in ED Medications - No data to display  ED Course  I have reviewed the triage vital signs and the nursing notes.  Pertinent labs & imaging results that were available during my care of the patient were reviewed by me and considered in my medical decision making (see chart for details).    MDM Rules/Calculators/A&P  Patient is a 75 year old female with no prior cardiac history.  She presents today with complaints of chest discomfort that woke her from sleep at approximately 2 AM.  She received fentanyl by EMS with some relief.  Patient arrives here with stable vital signs.  Initial EKG shows no obvious STEMI or ischemic changes.  Laboratory studies obtained do reveal a troponin of 76 with normal renal function.  Patient started on heparin and nitroglycerin paste.  She took 325 mg of aspirin at home prior to coming here at the advice of the 911 operator.  Care has been discussed with Dr. Doylene Canard from cardiology.  He will come to the ER to evaluate her.  CRITICAL CARE Performed by: Veryl Speak Total critical care time: 40 minutes Critical care time was exclusive of separately billable procedures and treating other patients. Critical care was necessary to treat or prevent imminent or life-threatening deterioration. Critical care was time spent personally by me on the following activities: development of treatment plan with patient and/or surrogate as well as nursing, discussions with consultants,  evaluation of patient's response to treatment, examination of patient, obtaining history from patient or surrogate, ordering and performing treatments and interventions, ordering and review of laboratory studies, ordering and review of radiographic studies, pulse oximetry and re-evaluation  of patient's condition.   Final Clinical Impression(s) / ED Diagnoses Final diagnoses:  None    Rx / DC Orders ED Discharge Orders    None       Veryl Speak, MD 07/19/20 [phone]

## 2020-07-19 NOTE — ED Notes (Addendum)
Md notified of pts troponin of 288

## 2020-07-19 NOTE — ED Notes (Signed)
Md aware of pts hypertension.

## 2020-07-19 NOTE — ED Notes (Addendum)
Pt on 3 l [place] 

## 2020-07-19 NOTE — ED Notes (Signed)
Pt places on 2l [place]

## 2020-07-19 NOTE — Progress Notes (Signed)
ANTICOAGULATION CONSULT NOTE - Initial Consult  Pharmacy Consult for Heparin Indication: chest pain/ACS  Allergies  Allergen Reactions   Aspirin Other (See Comments)    MUST HAVE COATED ASA   Codeine Sulfate Other (See Comments)    "drugged out feeling" & hallucinations   Lipitor [Atorvastatin] Other (See Comments)    Muscle cramps   Nabumetone Other (See Comments)    Unsure of reaction (reaction occurred sometime ago)   Plaquenil [Hydroxychloroquine]     Weak, blurred vision   Pravastatin     Weak legs, arthralgias   Senna Other (See Comments)    Causes bleeding from the anus   Statins Other (See Comments)    Muscle aches and cramps    Patient Measurements: Height: 5' (152.4 cm) Weight: 59.9 kg (132 lb) IBW/kg (Calculated) : 45.5 Heparin Dosing Weight: TBW  Vital Signs: Temp: 97.6 F (36.4 C) (02/04 0341) Temp Source: Oral (02/04 0341) BP: 175/85 (02/04 0500) Pulse Rate: 65 (02/04 0500)  Labs: Recent Labs    07/19/20 0405  HGB 13.4  HCT 42.7  PLT 179  CREATININE 0.63  TROPONINIHS 76*    Estimated Creatinine Clearance: 50.7 mL/min (by C-G formula based on SCr of 0.63 mg/dL).   Medical History: Past Medical History:  Diagnosis Date   Arthritis    RA   COPD (chronic obstructive pulmonary disease) (HCC)    GERD (gastroesophageal reflux disease)    Neuromuscular disorder ([place]) 02/05/2015   Bells Palsy, right    Medications:  See electronic med rec  Assessment: 75 y.o. F presents with CP. To begin heparin for r/o ACS. No AC PTA. CBC stable.  Goal of Therapy:  Heparin level 0.3-0.7 units/ml Monitor platelets by anticoagulation protocol: Yes   Plan:  Heparin IV bolus 3500 units Heparin gtt at 800 units/hr Will f/u heparin level in 8 hours Daily heparin level and CBC  Sherlon Handing, PharmD, BCPS Please see amion for complete clinical pharmacist phone list 07/19/2020,5:29 AM

## 2020-07-19 NOTE — Plan of Care (Signed)
  Problem: Cardiovascular: Goal: Ability to achieve and maintain adequate cardiovascular perfusion will improve Outcome: Progressing   Problem: Education: Goal: Understanding of CV disease, CV risk reduction, and recovery process will improve Outcome: Progressing

## 2020-07-19 NOTE — ED Notes (Addendum)
Pt resting quietly.

## 2020-07-19 NOTE — H&P (Signed)
Referring Physician: Everlene Balls, MD  Debra Farmer is an 75 y.o. female.                       Chief Complaint: Chest pain  HPI: 76 years old female with PMH of rheumatoid arthritis, COPD and GERD has retrosternal pressure type chest pain radiating to left arm which started at 2 AM. She had some nausea but no diaphoresis. Chest pain improves with fentanyl by EMS and morphine by ER. She denies prior cardiac history. Her EKG shows non-specific T wave changes. Her chest x-ray is unremarkable except mild atelectasis and her HS-initial Troponin I is slightly elevated.  Past Medical History:  Diagnosis Date  . Arthritis    RA  . COPD (chronic obstructive pulmonary disease) ([facility])   . GERD (gastroesophageal reflux disease)   . Neuromuscular disorder ([place]) 02/05/2015   Bells Palsy, right      Past Surgical History:  Procedure Laterality Date  . ABDOMINAL HYSTERECTOMY     partial  . ANTERIOR CERVICAL DECOMPRESSION/DISCECTOMY FUSION 4 LEVELS N/A 04/03/2020   Procedure: ANTERIOR CERVICAL DECOMPRESSION/DISCECTOMY FUSION C3-7;  Surgeon: Melina Schools, MD;  Location: [facility];  Service: Orthopedics;  Laterality: N/A;  5 hrs  . APPENDECTOMY    . CATARACT EXTRACTION Bilateral    about 3 years ago   . CHOLECYSTECTOMY    . KNEE ARTHROSCOPY  1999   Dr Theda Sers -- R knee  . LUMBAR LAMINECTOMY/DECOMPRESSION MICRODISCECTOMY Right 08/26/2016   Procedure: Microlumbar decompression L4-5, L5-S1 on Right;  Surgeon: Susa Day, MD;  Location: [facility];  Service: Orthopedics;  Laterality: Right;  . POSTERIOR CERVICAL FUSION/FORAMINOTOMY N/A 04/04/2020   Procedure: POSTERIOR SPINAL FUSION INSTRUMENTATION CERVICAL THREE THROUGH SEVEN, CERVICAL THREE LAMINOTOMY;  Surgeon: Melina Schools, MD;  Location: [facility];  Service: Orthopedics;  Laterality: N/A;  5 hrs  . TOTAL HIP ARTHROPLASTY Right 04/01/2017   Procedure: RIGHT TOTAL HIP ARTHROPLASTY ANTERIOR APPROACH;  Surgeon: Rod Can, MD;  Location: [facility];   Service: Orthopedics;  Laterality: Right;  Needs RNFA    Family History  Problem Relation Age of Onset  . Heart disease Mother 60       CHF  . COPD Father 74       emphesema   Social History:  reports that she quit smoking about 2 years ago. Her smoking use included cigarettes. She has a 29.00 pack-year smoking history. She has never used smokeless tobacco. She reports that she does not drink alcohol and does not use drugs.  Allergies:  Allergies  Allergen Reactions  . Aspirin Other (See Comments)    MUST HAVE COATED ASA  . Codeine Sulfate Other (See Comments)    "drugged out feeling" & hallucinations  . Lipitor [Atorvastatin] Other (See Comments)    Muscle cramps  . Nabumetone Other (See Comments)    Unsure of reaction (reaction occurred sometime ago)  . Plaquenil [Hydroxychloroquine]     Weak, blurred vision  . Pravastatin     Weak legs, arthralgias  . Senna Other (See Comments)    Causes bleeding from the anus  . Statins Other (See Comments)    Muscle aches and cramps    (Not in a hospital admission)   Results for orders placed or performed during the hospital encounter of 07/19/20 (from the past 48 hour(s))  Troponin I (High Sensitivity)     Status: Abnormal   Collection Time: 07/19/20  4:05 AM  Result Value Ref Range   Troponin  I (High Sensitivity) 76 (H) <18 ng/L    Comment: (NOTE) Elevated high sensitivity troponin I (hsTnI) values and significant  changes across serial measurements may suggest ACS but many other  chronic and acute conditions are known to elevate hsTnI results.  Refer to the "Links" section for chest pain algorithms and additional  guidance. Performed at [facility], [place] [address], [place], [place] [address]   Comprehensive metabolic panel     Status: Abnormal   Collection Time: 07/19/20  4:05 AM  Result Value Ref Range   Sodium 138 135 - 145 mmol/L   Potassium 3.1 (L) 3.5 - 5.1 mmol/L   Chloride 102 98 - 111 mmol/L   CO2 26 22 -  32 mmol/L   Glucose, Bld 147 (H) 70 - 99 mg/dL    Comment: Glucose reference range applies only to samples taken after fasting for at least 8 hours.   BUN 11 8 - 23 mg/dL   Creatinine, Ser 0.63 0.44 - 1.00 mg/dL   Calcium 8.9 8.9 - 10.3 mg/dL   Total Protein 6.0 (L) 6.5 - 8.1 g/dL   Albumin 3.4 (L) 3.5 - 5.0 g/dL   AST 26 15 - 41 U/L   ALT 31 0 - 44 U/L   Alkaline Phosphatase 68 38 - 126 U/L   Total Bilirubin 0.5 0.3 - 1.2 mg/dL   GFR, Estimated >60 >60 mL/min    Comment: (NOTE) Calculated using the CKD-EPI Creatinine Equation (2021)    Anion gap 10 5 - 15    Comment: Performed at [facility], [place] [address], [place], [place] [address]  CBC with Differential     Status: None   Collection Time: 07/19/20  4:05 AM  Result Value Ref Range   WBC 4.1 4.0 - 10.5 K/uL   RBC 4.73 3.87 - 5.11 MIL/uL   Hemoglobin 13.4 12.0 - 15.0 g/dL   HCT 42.7 36.0 - 46.0 %   MCV 90.3 80.0 - 100.0 fL   MCH 28.3 26.0 - 34.0 pg   MCHC 31.4 30.0 - 36.0 g/dL   RDW 14.1 11.5 - 15.5 %   Platelets 179 150 - 400 K/uL   nRBC 0.0 0.0 - 0.2 %   Neutrophils Relative % 57 %   Neutro Abs 2.4 1.7 - 7.7 K/uL   Lymphocytes Relative 29 %   Lymphs Abs 1.2 0.7 - 4.0 K/uL   Monocytes Relative 11 %   Monocytes Absolute 0.4 0.1 - 1.0 K/uL   Eosinophils Relative 2 %   Eosinophils Absolute 0.1 0.0 - 0.5 K/uL   Basophils Relative 1 %   Basophils Absolute 0.0 0.0 - 0.1 K/uL   Immature Granulocytes 0 %   Abs Immature Granulocytes 0.01 0.00 - 0.07 K/uL    Comment: Performed at [facility], [address] [place], [place] [address]   DG Chest Port 1 View  Result Date: 07/19/2020 CLINICAL DATA:  Chest pain which started a few hours ago EXAM: PORTABLE CHEST 1 VIEW COMPARISON:  Radiograph 04/01/2020, CT 09/21/2017 FINDINGS: Slightly diminished lung volumes with some streaky opacities in the lung bases, increased from comparison. No focal consolidation. No pneumothorax or visible effusion. The aorta is calcified.  The remaining cardiomediastinal contours are unremarkable. No acute osseous or soft tissue abnormality. Prior cervical fusion, incompletely assessed on this exam. Telemetry leads overlie the chest. IMPRESSION: Slightly diminished lung volumes with some streaky opacities in the lung bases, favor atelectasis. No other acute cardiopulmonary abnormality.  Electronically Signed   By: Lovena Le M.D.   On: 07/19/2020 04:23    Review Of Systems Constitutional: No fever, chills, weight loss or gain. Eyes: No vision change, wears glasses. No discharge or pain. Ears: No hearing loss, No tinnitus. Respiratory: No asthma, positive COPD, pneumonias. Positive shortness of breath. No hemoptysis. Cardiovascular: Positive chest pain, palpitation, leg edema. Gastrointestinal: No nausea, vomiting, diarrhea, constipation. No GI bleed. No hepatitis. Genitourinary: No dysuria, hematuria, kidney stone. No incontinance. Neurological: No headache, stroke, seizures.  Psychiatry: No psych facility admission for anxiety, depression, suicide. No detox. Skin: No rash. Musculoskeletal: Positive joint pain, no fibromyalgia. Positive neck pain, back pain. Lymphadenopathy: No lymphadenopathy. Hematology: No anemia or easy bruising.   Blood pressure (!) 166/71, pulse 78, temperature 97.6 F (36.4 C), temperature source Oral, resp. rate (!) 23, height 5' (1.524 m), weight 59.9 kg, SpO2 100 %. Body mass index is 25.78 kg/m. General appearance: alert, cooperative, appears stated age and mild respiratory distress Head: Normocephalic, atraumatic. Eyes: Light Brown eyes, pink conjunctiva, corneas clear. PERRL, EOM's intact. Neck: No adenopathy, no carotid bruit, no JVD, supple, symmetrical, trachea midline and thyroid not enlarged. Resp: Clear to auscultation bilaterally. Cardio: Regular rate and rhythm, S1, S2 normal, III/VI systolic murmur, no click, rub or gallop GI: Soft, non-tender; bowel sounds normal; no  organomegaly. Extremities: Trace edema, cyanosis or clubbing. Skin: Warm and dry.  Neurologic: Alert and oriented X 3, normal strength.   Assessment/Plan Acute coronary syndrome H/O tobacco use disorder Rheumatoid arthritis R/O aortic stenosis GERD  Plan: Discussed possible cardiac catheterization with intervention. Echocardiogram to r/o AS severity Continue IV heparin.  Time spent: Review of old records, Lab, x-rays, EKG, other cardiac tests, examination, discussion with patient over 70 minutes.  Birdie Riddle, MD  07/19/2020, 6:27 AM

## 2020-07-19 NOTE — ED Notes (Signed)
ECHO in progress at bedside.

## 2020-07-19 NOTE — Plan of Care (Signed)
  Problem: Cardiovascular: Goal: Ability to achieve and maintain adequate cardiovascular perfusion will improve Outcome: Progressing Goal: Vascular access site(s) Level 0-1 will be maintained Outcome: Progressing   Problem: Education: Goal: Understanding of CV disease, CV risk reduction, and recovery process will improve Outcome: Progressing Goal: Individualized Educational Video(s) Outcome: Progressing   Problem: Activity: Goal: Ability to return to baseline activity level will improve Outcome: Progressing   Problem: Cardiovascular: Goal: Ability to achieve and maintain adequate cardiovascular perfusion will improve Outcome: Progressing Goal: Vascular access site(s) Level 0-1 will be maintained Outcome: Progressing   Problem: Health Behavior/Discharge Planning: Goal: Ability to safely manage health-related needs after discharge will improve Outcome: Progressing   Problem: Education: Goal: Knowledge of General Education information will improve Description: Including pain rating scale, medication(s)/side effects and non-pharmacologic comfort measures Outcome: Progressing   Problem: Health Behavior/Discharge Planning: Goal: Ability to manage health-related needs will improve Outcome: Progressing   Problem: Clinical Measurements: Goal: Ability to maintain clinical measurements within normal limits will improve Outcome: Progressing Goal: Will remain free from infection Outcome: Progressing Goal: Diagnostic test results will improve Outcome: Progressing Goal: Respiratory complications will improve Outcome: Progressing Goal: Cardiovascular complication will be avoided Outcome: Progressing   Problem: Activity: Goal: Risk for activity intolerance will decrease Outcome: Progressing   Problem: Nutrition: Goal: Adequate nutrition will be maintained Outcome: Progressing   Problem: Coping: Goal: Level of anxiety will decrease Outcome: Progressing   Problem:  Elimination: Goal: Will not experience complications related to bowel motility Outcome: Progressing Goal: Will not experience complications related to urinary retention Outcome: Progressing   Problem: Pain Managment: Goal: General experience of comfort will improve Outcome: Progressing   Problem: Safety: Goal: Ability to remain free from injury will improve Outcome: Progressing   Problem: Skin Integrity: Goal: Risk for impaired skin integrity will decrease Outcome: Progressing   

## 2020-07-19 NOTE — Progress Notes (Signed)
  Echocardiogram 2D Echocardiogram has been performed.  Debra Farmer 07/19/2020, 11:59 AM

## 2020-07-19 NOTE — ED Triage Notes (Signed)
Pt came in via [facility] ems with complaints of chest pain that has been radiating the left side of her arm. Pt denies any sob,nausea,vomitting, diarrhea. EMS reports pt has been hypertensive with a palpated at 200, other vitals were within normal limits.  EMS gave pt 50 mcg of fentanyl as well as 325 of aspirin at 3:30. Pt states no relief from the medications given. EMS reports EKG was unremarkable. EMS reports pt was clammy on arrival, here she is afebrile. Pt reports tramadol relieves her chest pain.

## 2020-07-20 LAB — BASIC METABOLIC PANEL
Anion gap: 9 (ref 5–15)
BUN: 6 mg/dL — ABNORMAL LOW (ref 8–23)
CO2: 23 mmol/L (ref 22–32)
Calcium: 8.5 mg/dL — ABNORMAL LOW (ref 8.9–10.3)
Chloride: 105 mmol/L (ref 98–111)
Creatinine, Ser: 0.58 mg/dL (ref 0.44–1.00)
GFR, Estimated: >60 mL/min (ref >60)
Glucose, Bld: 115 mg/dL — ABNORMAL HIGH (ref 70–99)
Potassium: 3.9 mmol/L (ref 3.5–5.1)
Sodium: 137 mmol/L (ref 135–145)

## 2020-07-20 LAB — CBC
HCT: 39.4 % (ref 36.0–46.0)
Hemoglobin: 12.6 g/dL (ref 12.0–15.0)
MCH: 28.6 pg (ref 26.0–34.0)
MCHC: 32 g/dL (ref 30.0–36.0)
MCV: 89.3 fL (ref 80.0–100.0)
Platelets: 190 10*3/uL (ref 150–400)
RBC: 4.41 MIL/uL (ref 3.87–5.11)
RDW: 14.6 % (ref 11.5–15.5)
WBC: 5.1 10*3/uL (ref 4.0–10.5)
nRBC: 0 % (ref 0.0–0.2)

## 2020-07-20 LAB — LIPID PANEL
Cholesterol: 275 mg/dL — ABNORMAL HIGH (ref 0–200)
HDL: 40 mg/dL — ABNORMAL LOW (ref >40)
LDL Cholesterol: 171 mg/dL — ABNORMAL HIGH (ref 0–99)
Total CHOL/HDL Ratio: 6.9 RATIO
Triglycerides: 321 mg/dL — ABNORMAL HIGH (ref <150)
VLDL: 64 mg/dL — ABNORMAL HIGH (ref 0–40)

## 2020-07-20 LAB — MRSA PCR SCREENING: MRSA by PCR: NEGATIVE

## 2020-07-20 LAB — HEMOGLOBIN A1C
Hgb A1c MFr Bld: 5.9 % — ABNORMAL HIGH (ref 4.8–5.6)
Mean Plasma Glucose: 122.63 mg/dL

## 2020-07-20 LAB — MAGNESIUM: Magnesium: 1.8 mg/dL (ref 1.7–2.4)

## 2020-07-20 MED ORDER — DILTIAZEM HCL 60 MG PO TABS
30.0000 mg | ORAL_TABLET | Freq: Two times a day (BID) | ORAL | Status: DC
  Administered 2020-07-20 – 2020-07-21 (×3): 30 mg via ORAL
  Filled 2020-07-20 (×3): qty 1

## 2020-07-20 MED ORDER — ONDANSETRON HCL 4 MG PO TABS
4.0000 mg | ORAL_TABLET | Freq: Three times a day (TID) | ORAL | Status: DC | PRN
  Administered 2020-07-20: 4 mg via ORAL
  Filled 2020-07-20: qty 1

## 2020-07-20 MED ORDER — SARILUMAB 200 MG/1.14ML ~~LOC~~ SOAJ: 1.0000 | SUBCUTANEOUS | Status: DC

## 2020-07-20 MED ORDER — ADULT MULTIVITAMIN W/MINERALS CH
1.0000 | ORAL_TABLET | Freq: Every day | ORAL | Status: DC
  Administered 2020-07-20 – 2020-07-21 (×2): 1 via ORAL
  Filled 2020-07-20 (×2): qty 1

## 2020-07-20 MED ORDER — CARVEDILOL 6.25 MG PO TABS
6.2500 mg | ORAL_TABLET | Freq: Two times a day (BID) | ORAL | Status: DC
  Administered 2020-07-20 – 2020-07-21 (×3): 6.25 mg via ORAL
  Filled 2020-07-20 (×3): qty 1

## 2020-07-20 MED ORDER — LEFLUNOMIDE 20 MG PO TABS
20.0000 mg | ORAL_TABLET | Freq: Every day | ORAL | Status: DC
  Administered 2020-07-20 – 2020-07-21 (×2): 20 mg via ORAL
  Filled 2020-07-20 (×2): qty 1

## 2020-07-20 NOTE — Plan of Care (Signed)
  Problem: Cardiovascular: Goal: Ability to achieve and maintain adequate cardiovascular perfusion will improve Outcome: Progressing Goal: Vascular access site(s) Level 0-1 will be maintained Outcome: Progressing   Problem: Education: Goal: Understanding of CV disease, CV risk reduction, and recovery process will improve Outcome: Progressing Goal: Individualized Educational Video(s) Outcome: Progressing   Problem: Activity: Goal: Ability to return to baseline activity level will improve Outcome: Progressing   Problem: Cardiovascular: Goal: Ability to achieve and maintain adequate cardiovascular perfusion will improve Outcome: Progressing Goal: Vascular access site(s) Level 0-1 will be maintained Outcome: Progressing   Problem: Health Behavior/Discharge Planning: Goal: Ability to safely manage health-related needs after discharge will improve Outcome: Progressing   Problem: Education: Goal: Knowledge of General Education information will improve Description: Including pain rating scale, medication(s)/side effects and non-pharmacologic comfort measures Outcome: Progressing   Problem: Health Behavior/Discharge Planning: Goal: Ability to manage health-related needs will improve Outcome: Progressing   Problem: Clinical Measurements: Goal: Ability to maintain clinical measurements within normal limits will improve Outcome: Progressing Goal: Will remain free from infection Outcome: Progressing Goal: Diagnostic test results will improve Outcome: Progressing Goal: Respiratory complications will improve Outcome: Progressing Goal: Cardiovascular complication will be avoided Outcome: Progressing   Problem: Activity: Goal: Risk for activity intolerance will decrease Outcome: Progressing   Problem: Nutrition: Goal: Adequate nutrition will be maintained Outcome: Progressing   Problem: Coping: Goal: Level of anxiety will decrease Outcome: Progressing   Problem:  Elimination: Goal: Will not experience complications related to bowel motility Outcome: Progressing Goal: Will not experience complications related to urinary retention Outcome: Progressing   Problem: Pain Managment: Goal: General experience of comfort will improve Outcome: Progressing   Problem: Safety: Goal: Ability to remain free from injury will improve Outcome: Progressing   Problem: Skin Integrity: Goal: Risk for impaired skin integrity will decrease Outcome: Progressing

## 2020-07-20 NOTE — Progress Notes (Signed)
Physical Therapy Discharge Patient Details Name: Debra Farmer MRN: [id number] DOB: [birth year] Today's Date: 07/20/2020 Time: 0569-7948 PT Time Calculation (min) (ACUTE ONLY): 15 min  Patient discharged from PT services secondary to goals met and no further PT needs identified.  Please see latest therapy progress note for current level of functioning and progress toward goals.    Progress and discharge plan discussed with patient and/or caregiver: Patient/Caregiver agrees with plan  [name], DPT Acute Rehabilitation Services [phone]  GP     [name] 07/20/2020, 4:54 PM

## 2020-07-20 NOTE — Progress Notes (Signed)
Ref: Plotnikov, Evie Lacks, MD   Subjective:  Feeling better. Mild tachycardia at rest.  CBS and BMET are stable with mild hyperglycemia.  Objective:  Vital Signs in the last 24 hours: Temp:  [98 F (36.7 C)-98.4 F (36.9 C)] 98.4 F (36.9 C) (02/05 0753) Pulse Rate:  [65-101] 91 (02/05 0800) Cardiac Rhythm: Normal sinus rhythm (02/05 0800) Resp:  [0-26] 13 (02/05 0800) BP: (99-174)/(52-141) 131/69 (02/05 0800) SpO2:  [90 %-100 %] 94 % (02/05 0800) Weight:  [62.9 kg] 62.9 kg (02/05 0400)  Physical Exam: BP Readings from Last 1 Encounters:  07/20/20 131/69     Wt Readings from Last 1 Encounters:  07/20/20 62.9 kg    Weight change: 3.025 kg Body mass index is 27.08 kg/m. HEENT: [place]/AT, Eyes-Light Brown, Bilateral lens implants, Conjunctiva-Pink, Sclera-Non-icteric Neck: No JVD, No bruit, Trachea midline. Lungs:  Clear, Bilateral. Cardiac:  Regular rhythm, normal S1 and S2, no S3. II/VI systolic murmur. Abdomen:  Soft, non-tender. BS present. Extremities:  Trace ankle edema present. No cyanosis. No clubbing. CNS: AxOx3, Cranial nerves grossly intact, moves all 4 extremities.  Skin: Warm and dry.   Intake/Output from previous day: 02/04 0701 - 02/05 0700 In: 1471.7 [P.O.:100; I.V.:1371.7] Out: 925 [Urine:925]    Lab Results: BMET    Component Value Date/Time   NA 137 07/20/2020 0024   NA 138 07/19/2020 0405   NA 134 (L) 05/11/2020 2203   NA 136 (A) 03/23/2018 0000   K 3.9 07/20/2020 0024   K 3.1 (L) 07/19/2020 0405   K 3.5 05/11/2020 2203   CL 105 07/20/2020 0024   CL 102 07/19/2020 0405   CL 98 05/11/2020 2203   CO2 23 07/20/2020 0024   CO2 26 07/19/2020 0405   CO2 25 05/11/2020 2203   GLUCOSE 115 (H) 07/20/2020 0024   GLUCOSE 147 (H) 07/19/2020 0405   GLUCOSE 132 (H) 05/11/2020 2203   BUN 6 (L) 07/20/2020 0024   BUN 11 07/19/2020 0405   BUN 14 05/11/2020 2203   BUN 16 03/23/2018 0000   CREATININE 0.58 07/20/2020 0024   CREATININE 0.63 07/19/2020 0405    CREATININE 0.64 05/11/2020 2203   CREATININE 0.72 01/23/2020 1608   CALCIUM 8.5 (L) 07/20/2020 0024   CALCIUM 8.9 07/19/2020 0405   CALCIUM 8.8 (L) 05/11/2020 2203   GFRNONAA >60 07/20/2020 0024   GFRNONAA >60 07/19/2020 0405   GFRNONAA >60 05/11/2020 2203   GFRAA >60 09/21/2017 1113   GFRAA >60 04/02/2017 0542   GFRAA >60 03/23/2017 1414   CBC    Component Value Date/Time   WBC 5.1 07/20/2020 0024   RBC 4.41 07/20/2020 0024   HGB 12.6 07/20/2020 0024   HCT 39.4 07/20/2020 0024   PLT 190 07/20/2020 0024   MCV 89.3 07/20/2020 0024   MCH 28.6 07/20/2020 0024   MCHC 32.0 07/20/2020 0024   RDW 14.6 07/20/2020 0024   LYMPHSABS 1.2 07/19/2020 0405   MONOABS 0.4 07/19/2020 0405   EOSABS 0.1 07/19/2020 0405   BASOSABS 0.0 07/19/2020 0405   HEPATIC Function Panel Recent Labs    01/23/20 1608 05/11/20 2203 07/19/20 0405  PROT 7.1 7.2 6.0*   HEMOGLOBIN A1C No components found for: HGA1C,  MPG CARDIAC ENZYMES No results found for: CKTOTAL, CKMB, CKMBINDEX, TROPONINI BNP No results for input(s): PROBNP in the last 8760 hours. TSH Recent Labs    10/26/19 1614 01/23/20 1608  TSH 1.25 1.83   CHOLESTEROL Recent Labs    10/26/19 1614 07/19/20 1840  CHOL  266* 272*    Scheduled Meds: . aspirin EC  81 mg Oral Daily  . carvedilol  6.25 mg Oral BID WC  . Chlorhexidine Gluconate Cloth  6 each Topical Daily  . diltiazem  30 mg Oral Q12H  . heparin  5,000 Units Subcutaneous Q8H  . leflunomide  20 mg Oral Daily  . multivitamin with minerals  1 tablet Oral Daily  . sodium chloride flush  3 mL Intravenous Q12H  . ticagrelor  90 mg Oral BID   Continuous Infusions: . sodium chloride    . nitroGLYCERIN 15 mcg/min (07/20/20 0600)   PRN Meds:.sodium chloride, acetaminophen, nitroGLYCERIN, ondansetron (ZOFRAN) IV, ondansetron, sodium chloride flush, traMADol  Assessment/Plan: NSTEMI S/P Stent in RCA and LCx Rheumatoid arthritis Mild aortic stenosis H/O tobacco use  disorder GERD  Check Lipid panel, Hgb A1C. Increase Carvedilol dose. Add small dose diltiazem for frequent APCs and tachycardia PT evaluation and treatment.   LOS: 1 day   Time spent including chart review, lab review, examination, discussion with patient : 30 min   Dixie Dials  MD  07/20/2020, 10:16 AM

## 2020-07-20 NOTE — Evaluation (Signed)
Physical Therapy Evaluation Patient Details Name: Debra Farmer MRN: [id number] DOB: 12-19-1945 Today's Date: 07/20/2020   History of Present Illness  75 years old female with PMH of rheumatoid arthritis, COPD and GERD has retrosternal pressure type chest pain radiating to left arm which started at 2 AM. She had some nausea but no diaphoresis. Chest pain improves with fentanyl by EMS and morphine by ER. She denies prior cardiac history.  Her EKG shows non-specific T wave changes. Her chest x-ray is unremarkable except mild atelectasis and her HS-initial Troponin I is slightly elevated. S/p stent placement  Clinical Impression  Pt seen leaving bathroom without assist. Per RN and limited gait assessment in room pt I with mobility. Pt requiring increased education regarding energy conservation, participation in cardiac rehab, level of activity exertion and stair negotiation due to R hip pain. Follow education pt and husband verbalized understanding. No further acute PT needs noted, PT will sign off    Follow Up Recommendations No PT follow up;Supervision - Intermittent    Equipment Recommendations  None recommended by PT    Recommendations for Other Services       Precautions / Restrictions Precautions Precautions: None      Mobility  Bed Mobility Overal bed mobility: Independent                  Transfers Overall transfer level: Independent Equipment used: None                Ambulation/Gait Ambulation/Gait assistance: Independent Gait Distance (Feet): 15 Feet Assistive device: None       General Gait Details: pt ambulated from bathroom to chair I no LOB noted  Stairs            Wheelchair Mobility    Modified Rankin (Stroke Patients Only)       Balance Overall balance assessment: Mild deficits observed, not formally tested                                           Pertinent Vitals/Pain Pain Assessment:  (pt complained of  scaitic pain)    Home Living Family/patient expects to be discharged to:: Private residence Living Arrangements: Spouse/significant other Available Help at Discharge: Family;Available PRN/intermittently Type of Home: House Home Access: Stairs to enter Entrance Stairs-Rails: Right;Left;Can reach both Entrance Stairs-Number of Steps: 3 Home Layout: One level Home Equipment: Shower seat - built in;Grab bars - tub/shower      Prior Function Level of Independence: Independent         Comments: 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 offered to perform stais for practice, pt states her hip pain is  too much right now. therapist providing education and demonstration on sequencing stairs. increased time spent educating pt on participation in cardiac rehab    Exercises     Assessment/Plan    PT Assessment Patent does not need any further PT services  PT Problem List         PT Treatment Interventions      PT Goals (Current goals can be found in the Care Plan section)  Acute Rehab PT Goals Patient Stated Goal: To go home PT Goal Formulation: With patient/family Time For Goal Achievement: 07/20/20 Potential to Achieve Goals: Good    Frequency     Barriers to discharge        Co-evaluation               AM-PAC PT "6 Clicks" Mobility  Outcome Measure Help  needed turning from your back to your side while in a flat bed without using bedrails?: None Help needed moving from lying on your back to sitting on the side of a flat bed without using bedrails?: None Help needed moving to and from a bed to a chair (including a wheelchair)?: None Help needed standing up from a chair using your arms (e.g., wheelchair or bedside chair)?: None Help needed to walk in hospital room?: None Help needed climbing 3-5 steps with a railing? : A Little 6 Click Score: 23    End of Session   Activity Tolerance: Patient tolerated treatment well Patient left: in chair;with family/visitor present;with call bell/phone within reach Nurse Communication: Mobility status      Time: 1275-1700 PT Time Calculation (min) (ACUTE ONLY): 15 min   Charges:              [name], DPT Acute Rehabilitation Services [phone]  [name] 07/20/2020, 4:51 PM

## 2020-07-20 NOTE — Progress Notes (Signed)
CARDIAC REHAB PHASE I   PRE:  Rate/Rhythm:   BP:  Supine:   Sitting:   Standing:    SaO2:   MODE:  Ambulation: 700 ft   POST:  Rate/Rhythm:   BP:  Supine:   Sitting:   Standing:    SaO2:  Patient ambulated with nursing staff earlier without angina or difficulty. Education completed re: MI and restrictions, angina symptoms, NTG usage, exercise guidelines and progression, referred to phase II cardiac Rehab in [place].  Discussed heart healthy nutrition tips and made diet suggestions to incorporate more fruits and vegetables.  Instructed to walk aga in with nursing staff this evening. [phone] Liliane Channel RN, BSN 07/20/2020 3:13 PM

## 2020-07-21 LAB — CBC
HCT: 36 % (ref 36.0–46.0)
Hemoglobin: 11.6 g/dL — ABNORMAL LOW (ref 12.0–15.0)
MCH: 28.7 pg (ref 26.0–34.0)
MCHC: 32.2 g/dL (ref 30.0–36.0)
MCV: 89.1 fL (ref 80.0–100.0)
Platelets: 171 10*3/uL (ref 150–400)
RBC: 4.04 MIL/uL (ref 3.87–5.11)
RDW: 14.5 % (ref 11.5–15.5)
WBC: 3.9 10*3/uL — ABNORMAL LOW (ref 4.0–10.5)
nRBC: 0 % (ref 0.0–0.2)

## 2020-07-21 MED ORDER — TICAGRELOR 90 MG PO TABS: 90.0000 mg | ORAL_TABLET | Freq: Two times a day (BID) | ORAL | 3 refills | Status: DC

## 2020-07-21 MED ORDER — CARVEDILOL 6.25 MG PO TABS: 6.2500 mg | ORAL_TABLET | Freq: Two times a day (BID) | ORAL | 3 refills | Status: DC

## 2020-07-21 MED ORDER — NITROGLYCERIN 0.4 MG SL SUBL: 0.4000 mg | SUBLINGUAL_TABLET | SUBLINGUAL | 1 refills | Status: AC | PRN

## 2020-07-21 MED ORDER — DILTIAZEM HCL 30 MG PO TABS: 30.0000 mg | ORAL_TABLET | Freq: Two times a day (BID) | ORAL | 3 refills | Status: DC

## 2020-07-21 MED ORDER — EZETIMIBE 10 MG PO TABS: 10.0000 mg | ORAL_TABLET | Freq: Every day | ORAL | 3 refills | Status: DC

## 2020-07-21 MED ORDER — ASPIRIN 81 MG PO TBEC: 81.0000 mg | DELAYED_RELEASE_TABLET | Freq: Every day | ORAL | 11 refills | Status: DC

## 2020-07-21 MED ORDER — EZETIMIBE 10 MG PO TABS
10.0000 mg | ORAL_TABLET | Freq: Every day | ORAL | Status: DC
  Administered 2020-07-21: 10 mg via ORAL
  Filled 2020-07-21: qty 1

## 2020-07-21 NOTE — Progress Notes (Signed)
Patient discharge instructions reviewed and questions answered.  Patient verbalized understanding of medications and follow up appointments.  Brilentat coupon given along with written prescriptions as patient pharmacy is closed.  Via wheelchair to spouse's waiting care in stable condition.

## 2020-07-21 NOTE — Plan of Care (Signed)
  Problem: Cardiovascular: Goal: Ability to achieve and maintain adequate cardiovascular perfusion will improve Outcome: Completed/Met Goal: Vascular access site(s) Level 0-1 will be maintained Outcome: Completed/Met   Problem: Education: Goal: Understanding of CV disease, CV risk reduction, and recovery process will improve Outcome: Completed/Met Goal: Individualized Educational Video(s) Outcome: Completed/Met   Problem: Activity: Goal: Ability to return to baseline activity level will improve Outcome: Completed/Met   Problem: Cardiovascular: Goal: Ability to achieve and maintain adequate cardiovascular perfusion will improve Outcome: Completed/Met Goal: Vascular access site(s) Level 0-1 will be maintained Outcome: Completed/Met   Problem: Health Behavior/Discharge Planning: Goal: Ability to safely manage health-related needs after discharge will improve Outcome: Completed/Met   Problem: Education: Goal: Knowledge of General Education information will improve Description: Including pain rating scale, medication(s)/side effects and non-pharmacologic comfort measures Outcome: Completed/Met   Problem: Health Behavior/Discharge Planning: Goal: Ability to manage health-related needs will improve Outcome: Completed/Met   Problem: Clinical Measurements: Goal: Ability to maintain clinical measurements within normal limits will improve Outcome: Completed/Met Goal: Will remain free from infection Outcome: Completed/Met Goal: Diagnostic test results will improve Outcome: Completed/Met Goal: Respiratory complications will improve Outcome: Completed/Met Goal: Cardiovascular complication will be avoided Outcome: Completed/Met   Problem: Activity: Goal: Risk for activity intolerance will decrease Outcome: Completed/Met   Problem: Nutrition: Goal: Adequate nutrition will be maintained Outcome: Completed/Met   Problem: Coping: Goal: Level of anxiety will decrease Outcome:  Completed/Met   Problem: Elimination: Goal: Will not experience complications related to bowel motility Outcome: Completed/Met Goal: Will not experience complications related to urinary retention Outcome: Completed/Met   Problem: Pain Managment: Goal: General experience of comfort will improve Outcome: Completed/Met   Problem: Safety: Goal: Ability to remain free from injury will improve Outcome: Completed/Met   Problem: Skin Integrity: Goal: Risk for impaired skin integrity will decrease Outcome: Completed/Met

## 2020-07-21 NOTE — Discharge Summary (Signed)
Physician Discharge Summary  Patient ID: Debra Farmer MRN: [id number] DOB/AGE: 1945/09/10 75 y.o.  Admit date: 07/19/2020 Discharge date: 07/21/2020  Admission Diagnoses: Acute coronary syndrome H/O tobacco use disorder Rheumatoid arthritis R/O aortic valve stenosis GERD  Discharge Diagnoses:  Principle Problem: NSTEMI (non-ST elevated myocardial infarction) ([place]) Active Problems:  S/P Stent in RCA and LCx Rheumatoid arthritis Mild Aortic valve stenosis H/O tobacco use disorder Hyperglycemia COPD GERD  Discharged Condition: fair  Hospital Course: 74 years old female with PMH of rheumatoid arthritis, COPD and GERD had typical angina with nausea. Her Troponin I increased to 2175 ng. She underwent cardiac catheterization showing multivessel CAD. Culprit lesion in Mid RCA and LCx were treated with drug eluting stents with good result. Patient ambulated well post procedure. Right wrist cath site had mild ecchymosis. Diet and activity were discussed. She needed small dose of carvedilol and diltiazem for heart rate control.  Since she has Statin allergy Ezetimibe was started for hyperlipidemia. Sinus tachycardia with frequent APCs responded to Carvedilol and small dose diltiazem. She may resume prednisone after 1 month. She will see me in 1 week and Dr. Alain Marion in 2 weeks. She may join cardiac rehab.  Consults: cardiology  Significant Diagnostic Studies: labs: Normal CBC, normal BMET except mild hyperglycemia and hypokalemia, corrected with potassium supplement. Hgb A1C was 5.9 %  EKG: SR, anterior ischemia. EKG post stenting was normal.  Chest X-ray: Mild atelectasis otherwise unremarkable.  Echocardiogram: Normal LV systolic function. Mild MR, TR and AS.  Cardiac cath: Multivessel CAD. 3.5 x 30 Onyx stent in RCA and 2.5 x 12 mm Onyx stent in LCx with good result.  Treatments: cardiac meds: Aspirin, Brilinta, Ezetimibe, carvedilol and diltiazem.  Discharge Exam: Blood  pressure 111/66, pulse 70, temperature 98.2 F (36.8 C), temperature source Oral, resp. rate 20, height 5' (1.524 m), weight 62.2 kg, SpO2 100 %. General appearance: alert, cooperative and appears stated age. Head: Normocephalic, atraumatic. Eyes: Light Brown eyes, pink conjunctiva, corneas clear.   Neck: No adenopathy, no carotid bruit, no JVD, supple, symmetrical, trachea midline and thyroid not enlarged. Resp: Clear to auscultation bilaterally. Cardio: Regular rate and rhythm, S1, S2 normal, II/VI systolic murmur, no click, rub or gallop. GI: Soft, non-tender; bowel sounds normal; no organomegaly. Extremities: No edema, cyanosis or clubbing. Mild ecchymosis of right wrist. Skin: Warm and dry.  Neurologic: Alert and oriented X 3, normal strength and tone. Normal coordination and gait.  Disposition: Discharge disposition: 01-Home or Self Care       Discharge Instructions    Amb Referral to Cardiac Rehabilitation   Complete by: As directed    Diagnosis:  Coronary Stents NSTEMI     After initial evaluation and assessments completed: Virtual Based Care may be provided alone or in conjunction with Phase 2 Cardiac Rehab based on patient barriers.: Yes     Allergies as of 07/21/2020      Reactions   Aspirin Other (See Comments)   MUST HAVE COATED ASA   Codeine Sulfate Other (See Comments)   "drugged out feeling" & hallucinations   Lipitor [atorvastatin] Other (See Comments)   Muscle cramps   Nabumetone Other (See Comments)   Unsure of reaction (reaction occurred sometime ago)   Plaquenil [hydroxychloroquine]    Weak, blurred vision   Pravastatin    Weak legs, arthralgias   Senna Other (See Comments)   Causes bleeding from the anus   Statins Other (See Comments)   Muscle aches and cramps  Medication List    STOP taking these medications   predniSONE 1 MG tablet Commonly known as: DELTASONE     TAKE these medications   aspirin 81 MG EC tablet Take 1 tablet (81 mg  total) by mouth daily. Swallow whole.   BIOTIN PO Take 2 tablets by mouth daily. gummy   carvedilol 6.25 MG tablet Commonly known as: COREG Take 1 tablet (6.25 mg total) by mouth 2 (two) times daily with a meal.   diltiazem 30 MG tablet Commonly known as: CARDIZEM Take 1 tablet (30 mg total) by mouth every 12 (twelve) hours.   ezetimibe 10 MG tablet Commonly known as: ZETIA Take 1 tablet (10 mg total) by mouth daily.   Kevzara 200 MG/1.14ML Soaj Generic drug: Sarilumab Inject 1 Dose into the skin See admin instructions. Every 2 weeks   leflunomide 20 MG tablet Commonly known as: ARAVA Take 20 mg by mouth daily.   melatonin 3 MG Tabs tablet Take 3 mg by mouth at bedtime as needed (for sleep).   multivitamin with minerals tablet Take 1 tablet by mouth daily. Immune   nitroGLYCERIN 0.4 MG SL tablet Commonly known as: NITROSTAT Place 1 tablet (0.4 mg total) under the tongue every 5 (five) minutes x 3 doses as needed for chest pain.   ondansetron 4 MG tablet Commonly known as: Zofran Take 1 tablet (4 mg total) by mouth every 8 (eight) hours as needed for nausea or vomiting.   ticagrelor 90 MG Tabs tablet Commonly known as: BRILINTA Take 1 tablet (90 mg total) by mouth 2 (two) times daily.   traMADol 50 MG tablet Commonly known as: ULTRAM Take 1 tablet (50 mg total) by mouth every 6 (six) hours as needed. What changed: reasons to take this   Vitamin B-12 5000 MCG Tbdp Take 5,000 mcg by mouth daily.   Vitamin D3 125 MCG (5000 UT) Tabs Take 5,000 Units by mouth in the morning and at bedtime. WITH A GLASS OF VANILLA-HONEY ALMOND MILK   zolpidem 10 MG tablet Commonly known as: AMBIEN Take 1 tablet (10 mg total) by mouth at bedtime as needed. for sleep What changed:   when to take this  additional instructions       Follow-up Information    Plotnikov, Evie Lacks, MD. Schedule an appointment as soon as possible for a visit in 2 week(s).   Specialty: Internal  Medicine Contact information: [place] [address] [phone]        Dixie Dials, MD. Schedule an appointment as soon as possible for a visit in 1 week(s).   Specialty: Cardiology Why: New office address is [address] [place], [place] [address] Contact information: [place] [address] [phone]               Time spent: Review of old chart, current chart, lab, x-ray, cardiac tests and discussion with patient/Nurse over 60 minutes.  Signed: Birdie Riddle 07/21/2020, 7:47 AM

## 2020-07-22 ENCOUNTER — Telehealth: Payer: Self-pay

## 2020-07-22 ENCOUNTER — Encounter (HOSPITAL_COMMUNITY): Payer: Self-pay | Admitting: *Deleted

## 2020-07-22 ENCOUNTER — Encounter (HOSPITAL_COMMUNITY): Payer: Self-pay | Admitting: Cardiovascular Disease

## 2020-07-22 NOTE — Progress Notes (Signed)
When I worked with patient on 07/20/2020 it was 1400-1520, the previous time was incorrect.

## 2020-07-22 NOTE — Telephone Encounter (Signed)
Transition Care Management Follow-up Telephone Call  Date of discharge and from where: 07/19/2020 from [facility]  How have you been since you were released from the hospital?  Still weak; was told to only walk and rest  Any questions or concerns? No  Items Reviewed:  Did the pt receive and understand the discharge instructions provided? Yes   Medications obtained and verified? Yes   Other? No   Any new allergies since your discharge? No   Dietary orders reviewed? Yes, heart healthy diet  Do you have support at home? Yes , husband  [place] and Equipment/Supplies: Were home health services ordered? no If so, what is the name of the agency? n/a  Has the agency set up a time to come to the patient's home? not applicable Were any new equipment or medical supplies ordered?  No What is the name of the medical supply agency? n/a Were you able to get the supplies/equipment? not applicable Do you have any questions related to the use of the equipment or supplies? No  Functional Questionnaire: (I = Independent and D = Dependent) ADLs: I  Bathing/Dressing- I  Meal Prep- I  Eating- I  Maintaining continence- I  Transferring/Ambulation- I  Managing Meds- I  Follow up appointments reviewed:   PCP Hospital f/u appt confirmed? Yes  Scheduled to see Cathlyn Parsons, MD on 08/05/2020 @ 2:40pm.  [facility] f/u appt confirmed? No  Scheduled to see Dixie Dials, MD as soon as possible in 1 week.  Are transportation arrangements needed? No   If their condition worsens, is the pt aware to call PCP or go to the Emergency Dept.? Yes  Was the patient provided with contact information for the PCP's office or ED? Yes  Was to pt encouraged to call back with questions or concerns? Yes

## 2020-07-29 ENCOUNTER — Telehealth (HOSPITAL_COMMUNITY): Payer: Self-pay

## 2020-07-29 NOTE — Telephone Encounter (Signed)
Per phase I, fax cardiac rehab referral to [facility].  

## 2020-07-30 DIAGNOSIS — J449 Chronic obstructive pulmonary disease, unspecified: Secondary | ICD-10-CM | POA: Diagnosis not present

## 2020-07-30 DIAGNOSIS — I2510 Atherosclerotic heart disease of native coronary artery without angina pectoris: Secondary | ICD-10-CM | POA: Diagnosis not present

## 2020-07-30 DIAGNOSIS — I350 Nonrheumatic aortic (valve) stenosis: Secondary | ICD-10-CM | POA: Diagnosis not present

## 2020-07-30 DIAGNOSIS — Z9861 Coronary angioplasty status: Secondary | ICD-10-CM | POA: Diagnosis not present

## 2020-08-05 ENCOUNTER — Other Ambulatory Visit: Payer: Self-pay

## 2020-08-05 ENCOUNTER — Encounter: Payer: Self-pay | Admitting: Internal Medicine

## 2020-08-05 ENCOUNTER — Ambulatory Visit (INDEPENDENT_AMBULATORY_CARE_PROVIDER_SITE_OTHER): Payer: Medicare Other | Admitting: Internal Medicine

## 2020-08-05 DIAGNOSIS — G4709 Other insomnia: Secondary | ICD-10-CM | POA: Diagnosis not present

## 2020-08-05 DIAGNOSIS — I2510 Atherosclerotic heart disease of native coronary artery without angina pectoris: Secondary | ICD-10-CM | POA: Diagnosis not present

## 2020-08-05 DIAGNOSIS — I214 Non-ST elevation (NSTEMI) myocardial infarction: Secondary | ICD-10-CM

## 2020-08-05 DIAGNOSIS — E785 Hyperlipidemia, unspecified: Secondary | ICD-10-CM

## 2020-08-05 DIAGNOSIS — G720 Drug-induced myopathy: Secondary | ICD-10-CM

## 2020-08-05 DIAGNOSIS — I249 Acute ischemic heart disease, unspecified: Secondary | ICD-10-CM | POA: Diagnosis not present

## 2020-08-05 DIAGNOSIS — T466X5A Adverse effect of antihyperlipidemic and antiarteriosclerotic drugs, initial encounter: Secondary | ICD-10-CM | POA: Diagnosis not present

## 2020-08-05 NOTE — Assessment & Plan Note (Signed)
Zolpidem prn  Potential benefits of a long term benzodiazepines  use as well as potential risks  and complications were explained to the patient and were aknowledged. 

## 2020-08-05 NOTE — Progress Notes (Signed)
Subjective:  Patient ID: Debra Farmer, female    DOB: [birth year]  Age: 75 y.o. MRN: [id number]  CC: Follow-up (TCM Hosp f/u)   HPI AMAN BATLEY presents for post - MI f/u F/u RA, LBP, insomnia  F/u neck pain - - s/p surgery Per hx:  "Admit date: 07/19/2020 Discharge date: 07/21/2020  Admission Diagnoses: Acute coronary syndrome H/O tobacco use disorder Rheumatoid arthritis R/O aortic valve stenosis GERD  Discharge Diagnoses:  Principle Problem: NSTEMI (non-ST elevated myocardial infarction) ([place]) Active Problems:  S/P Stent in RCA and LCx Rheumatoid arthritis Mild Aortic valve stenosis H/O tobacco use disorder Hyperglycemia COPD GERD  Discharged Condition: fair  Hospital Course: 75 years old female with PMH of rheumatoid arthritis, COPD and GERD had typical angina with nausea. Her Troponin I increased to 2175 ng. She underwent cardiac catheterization showing multivessel CAD. Culprit lesion in Mid RCA and LCx were treated with drug eluting stents with good result. Patient ambulated well post procedure. Right wrist cath site had mild ecchymosis. Diet and activity were discussed. She needed small dose of carvedilol and diltiazem for heart rate control.  Since she has Statin allergy Ezetimibe was started for hyperlipidemia. Sinus tachycardia with frequent APCs responded to Carvedilol and small dose diltiazem. She may resume prednisone after 1 month. She will see me in 1 week and Dr. Alain Marion in 2 weeks. She may join cardiac rehab.  Consults: cardiology  Significant Diagnostic Studies: labs: Normal CBC, normal BMET except mild hyperglycemia and hypokalemia, corrected with potassium supplement. Hgb A1C was 5.9 %  EKG: SR, anterior ischemia. EKG post stenting was normal.  Chest X-ray: Mild atelectasis otherwise unremarkable.  Echocardiogram: Normal LV systolic function. Mild MR, TR and AS.  Cardiac cath: Multivessel CAD. 3.5 x 30 Onyx stent in RCA  and 2.5 x 12 mm Onyx stent in LCx with good result.  Treatments: cardiac meds: Aspirin, Brilinta, Ezetimibe, carvedilol and diltiazem.  Discharge Exam: Blood pressure 111/66, pulse 70, temperature 98.2 F (36.8 C), temperature source Oral, resp. rate 20, height 5' (1.524 m), weight 62.2 kg, SpO2 100 %. General appearance: alert, cooperative and appears stated age. Head: Normocephalic, atraumatic. Eyes: Light Brown eyes, pink conjunctiva, corneas clear.   Neck: No adenopathy, no carotid bruit, no JVD, supple, symmetrical, trachea midline and thyroid not enlarged. Resp: Clear to auscultation bilaterally. Cardio: Regular rate and rhythm, S1, S2 normal, II/VI systolic murmur, no click, rub or gallop. GI: Soft, non-tender; bowel sounds normal; no organomegaly. Extremities: No edema, cyanosis or clubbing. Mild ecchymosis of right wrist. Skin: Warm and dry.  Neurologic: Alert and oriented X 3, normal strength and tone. Normal coordination and gait.  Disposition: Discharge disposition: 01-Home or Self Care"   Outpatient Medications Prior to Visit  Medication Sig Dispense Refill  . aspirin EC 81 MG EC tablet Take 1 tablet (81 mg total) by mouth daily. Swallow whole. 30 tablet 11  . BIOTIN PO Take 2 tablets by mouth daily. gummy    . carvedilol (COREG) 6.25 MG tablet Take 1 tablet (6.25 mg total) by mouth 2 (two) times daily with a meal. 60 tablet 3  . Cholecalciferol (VITAMIN D3) 5000 units TABS Take 5,000 Units by mouth in the morning and at bedtime. WITH A GLASS OF VANILLA-HONEY ALMOND MILK    . Cyanocobalamin (VITAMIN B-12) 5000 MCG TBDP Take 5,000 mcg by mouth daily.    [name] diltiazem (CARDIZEM) 30 MG tablet Take 1 tablet (30 mg total) by mouth every 12 (twelve) hours. 60 tablet  3  . ezetimibe (ZETIA) 10 MG tablet Take 1 tablet (10 mg total) by mouth daily. 30 tablet 3  . leflunomide (ARAVA) 20 MG tablet Take 20 mg by mouth daily.    . Melatonin 3 MG TABS Take 3 mg by mouth at bedtime as  needed (for sleep).    . Multiple Vitamins-Minerals (MULTIVITAMIN WITH MINERALS) tablet Take 1 tablet by mouth daily. Immune    . nitroGLYCERIN (NITROSTAT) 0.4 MG SL tablet Place 1 tablet (0.4 mg total) under the tongue every 5 (five) minutes x 3 doses as needed for chest pain. 25 tablet 1  . ondansetron (ZOFRAN) 4 MG tablet Take 1 tablet (4 mg total) by mouth every 8 (eight) hours as needed for nausea or vomiting. 20 tablet 0  . Sarilumab (KEVZARA) 200 MG/1.14ML SOAJ Inject 1 Dose into the skin See admin instructions. Every 2 weeks    . ticagrelor (BRILINTA) 90 MG TABS tablet Take 1 tablet (90 mg total) by mouth 2 (two) times daily. 60 tablet 3  . traMADol (ULTRAM) 50 MG tablet Take 1 tablet (50 mg total) by mouth every 6 (six) hours as needed. (Patient taking differently: Take 50 mg by mouth every 6 (six) hours as needed for moderate pain.) 120 tablet 3  . zolpidem (AMBIEN) 10 MG tablet Take 1 tablet (10 mg total) by mouth at bedtime as needed. for sleep (Patient taking differently: Take 10 mg by mouth at bedtime.) 90 tablet 1   No facility-administered medications prior to visit.    ROS: Review of Systems  Constitutional: Positive for fatigue. Negative for activity change, appetite change, chills and unexpected weight change.  HENT: Negative for congestion, mouth sores and sinus pressure.   Eyes: Negative for visual disturbance.  Respiratory: Negative for cough and chest tightness.   Gastrointestinal: Negative for abdominal pain and nausea.  Genitourinary: Negative for difficulty urinating, frequency and vaginal pain.  Musculoskeletal: Positive for arthralgias and back pain. Negative for gait problem.  Skin: Negative for pallor and rash.  Neurological: Negative for dizziness, tremors, weakness, numbness and headaches.  Hematological: Bruises/bleeds easily.  Psychiatric/Behavioral: Negative for confusion, sleep disturbance and suicidal ideas.    Objective:  BP 130/70 (BP Location: Left  Arm)   Pulse 79   Temp 98.4 F (36.9 C) (Oral)   Ht 5\' 2"  (1.575 m)   Wt 134 lb (60.8 kg)   SpO2 94%   BMI 24.51 kg/m   BP Readings from Last 3 Encounters:  08/05/20 130/70  07/21/20 102/71  05/11/20 (!) 147/71    Wt Readings from Last 3 Encounters:  08/05/20 134 lb (60.8 kg)  07/21/20 137 lb 2 oz (62.2 kg)  04/03/20 137 lb 6.4 oz (62.3 kg)    Physical Exam Constitutional:      General: She is not in acute distress.    Appearance: She is well-developed.  HENT:     Head: Normocephalic.     Right Ear: External ear normal.     Left Ear: External ear normal.     Nose: Nose normal.     Mouth/Throat:     Mouth: Oropharynx is clear and moist.  Eyes:     General:        Right eye: No discharge.        Left eye: No discharge.     Conjunctiva/sclera: Conjunctivae normal.     Pupils: Pupils are equal, round, and reactive to light.  Neck:     Thyroid: No thyromegaly.  Vascular: No JVD.     Trachea: No tracheal deviation.  Cardiovascular:     Rate and Rhythm: Normal rate and regular rhythm.     Heart sounds: Normal heart sounds.  Pulmonary:     Effort: No respiratory distress.     Breath sounds: No stridor. No wheezing.  Abdominal:     General: Bowel sounds are normal. There is no distension.     Palpations: Abdomen is soft. There is no mass.     Tenderness: There is abdominal tenderness. There is no guarding or rebound.  Musculoskeletal:        General: No tenderness or edema.     Cervical back: Normal range of motion and neck supple.  Lymphadenopathy:     Cervical: No cervical adenopathy.  Skin:    Findings: Bruising present. No erythema or rash.  Neurological:     Mental Status: She is oriented to person, place, and time.     Cranial Nerves: No cranial nerve deficit.     Motor: No abnormal muscle tone.     Coordination: Coordination normal.     Deep Tendon Reflexes: Reflexes normal.  Psychiatric:        Mood and Affect: Mood and affect normal.         Behavior: Behavior normal.        Thought Content: Thought content normal.        Judgment: Judgment normal.     Lab Results  Component Value Date   WBC 3.9 (L) 07/21/2020   HGB 11.6 (L) 07/21/2020   HCT 36.0 07/21/2020   PLT 171 07/21/2020   GLUCOSE 115 (H) 07/20/2020   CHOL 275 (H) 07/20/2020   TRIG 321 (H) 07/20/2020   HDL 40 (L) 07/20/2020   LDLDIRECT 170.0 12/05/2014   LDLCALC 171 (H) 07/20/2020   ALT 31 07/19/2020   AST 26 07/19/2020   NA 137 07/20/2020   K 3.9 07/20/2020   CL 105 07/20/2020   CREATININE 0.58 07/20/2020   BUN 6 (L) 07/20/2020   CO2 23 07/20/2020   TSH 1.83 01/23/2020   INR 0.9 04/01/2020   HGBA1C 5.9 (H) 07/20/2020    CARDIAC CATHETERIZATION  Result Date: 07/19/2020  1st Diag lesion is 70% stenosed.  2nd Diag lesion is 75% stenosed.  Mid Cx lesion is 90% stenosed.  Prox RCA to Mid RCA lesion is 85% stenosed.  Dr. Tamala Julian placed 3.5 x 30 Onyx drug eluting stent in RCA, reduced 85 % lesion to 0 % with TIMI grade III flow and 2.5 x 12 mm Onyx stent in LCx, reduced 90 % lesion to 0 % with TIMI grade 3 flow.   CARDIAC CATHETERIZATION  Result Date: 07/19/2020  A stent was successfully placed.   Complicated procedure due to unfamiliarity with the patient's clinical history, ongoing severe chest discomfort, and technical issues associated with the left circumflex intervention.  85% mid right coronary with TIMI III flow was reduced to 10% with TIMI grade III flow using a 30 x 3.5 Onyx deployed at high pressure.  90% mid circumflex stenosis with TIMI grade II-III flow treated with a 2.5 x 12 mm Onyx reducing stenosis to less than 10% with TIMI grade III flow. RECOMMENDATIONS:  17-month duration of dual antiplatelet therapy.  Aspirin and Brilinta for now.  Can switch to clopidogrel and drop Brilinta after 30 days.  Management, general orders, and clinical follow-up per Dr. Dixie Dials.   DG Chest Port 1 View  Result Date: 07/19/2020 CLINICAL DATA:  Chest  pain which started a few hours ago EXAM: PORTABLE CHEST 1 VIEW COMPARISON:  Radiograph 04/01/2020, CT 09/21/2017 FINDINGS: Slightly diminished lung volumes with some streaky opacities in the lung bases, increased from comparison. No focal consolidation. No pneumothorax or visible effusion. The aorta is calcified. The remaining cardiomediastinal contours are unremarkable. No acute osseous or soft tissue abnormality. Prior cervical fusion, incompletely assessed on this exam. Telemetry leads overlie the chest. IMPRESSION: Slightly diminished lung volumes with some streaky opacities in the lung bases, favor atelectasis. No other acute cardiopulmonary abnormality. Electronically Signed   By: Lovena Le M.D.   On: 07/19/2020 04:23   ECHOCARDIOGRAM COMPLETE  Result Date: 07/19/2020    ECHOCARDIOGRAM REPORT   Patient Name:   LEKEYA ROLLINGS Date of Exam: 07/19/2020 Medical Rec #:  [id number]         Height:       60.0 in Accession #:    [id number]        Weight:       132.0 lb Date of Birth:  1945/07/10        BSA:          1.564 m Patient Age:    54 years          BP:           135/78 mmHg Patient Gender: F                 HR:           75 bpm. Exam Location:  Inpatient Procedure: 2D Echo, Cardiac Doppler and Color Doppler Indications:     CAD Native Vessel  History:         Patient has no prior history of Echocardiogram examinations.                  COPD; Risk Factors:Dyslipidemia and Former Smoker. GERD.  Sonographer:     Clayton Lefort RDCS (AE) Referring Phys:  [place] Diagnosing Phys: Dixie Dials MD IMPRESSIONS  1. Left ventricular ejection fraction, by estimation, is 55 to 60%. The left ventricle has normal function. The left ventricle has no regional wall motion abnormalities. There is mild concentric left ventricular hypertrophy. Left ventricular diastolic parameters are consistent with Grade I diastolic dysfunction (impaired relaxation).  2. Right ventricular systolic function is normal. The right  ventricular size is normal. Mildly increased right ventricular wall thickness.  3. Left atrial size was mildly dilated.  4. Right atrial size was mildly dilated.  5. The mitral valve is degenerative. Mild mitral valve regurgitation.  6. The aortic valve is tricuspid. There is moderate calcification of the aortic valve. There is moderate thickening of the aortic valve. Aortic valve regurgitation is mild. Mild aortic valve stenosis.  7. There is mild (Grade II) atheroma plaque involving the aortic root and ascending aorta.  8. The inferior vena cava is normal in size with greater than 50% respiratory variability, suggesting right atrial pressure of 3 mmHg. FINDINGS  Left Ventricle: Left ventricular ejection fraction, by estimation, is 55 to 60%. The left ventricle has normal function. The left ventricle has no regional wall motion abnormalities. The left ventricular internal cavity size was normal in size. There is  mild concentric left ventricular hypertrophy. Left ventricular diastolic parameters are consistent with Grade I diastolic dysfunction (impaired relaxation). Right Ventricle: The right ventricular size is normal. Mildly increased right ventricular wall thickness. Right ventricular systolic function is normal. Left Atrium: Left atrial size was mildly  dilated. Right Atrium: Right atrial size was mildly dilated. Pericardium: There is no evidence of pericardial effusion. Mitral Valve: The mitral valve is degenerative in appearance. There is mild thickening of the mitral valve leaflet(s). There is mild calcification of the mitral valve leaflet(s). Mild mitral annular calcification. Mild mitral valve regurgitation. MV peak  gradient, 4.6 mmHg. The mean mitral valve gradient is 2.0 mmHg. Tricuspid Valve: The tricuspid valve is normal in structure. Tricuspid valve regurgitation is mild. Aortic Valve: The aortic valve is tricuspid. There is moderate calcification of the aortic valve. There is moderate thickening of  the aortic valve. There is mild aortic valve annular calcification. Aortic valve regurgitation is mild. Aortic regurgitation  PHT measures 347 msec. Mild aortic stenosis is present. Aortic valve mean gradient measures 9.0 mmHg. Aortic valve peak gradient measures 17.8 mmHg. Aortic valve area, by VTI measures 1.38 cm. Pulmonic Valve: The pulmonic valve was normal in structure. Pulmonic valve regurgitation is not visualized. Aorta: The aortic root is normal in size and structure. There is mild (Grade II) atheroma plaque involving the aortic root and ascending aorta. Venous: The inferior vena cava is normal in size with greater than 50% respiratory variability, suggesting right atrial pressure of 3 mmHg. IAS/Shunts: No atrial level shunt detected by color flow Doppler.  LEFT VENTRICLE PLAX 2D LVIDd:         3.60 cm  Diastology LVIDs:         2.50 cm  LV e' medial:    4.62 cm/s LV PW:         1.40 cm  LV E/e' medial:  11.1 LV IVS:        1.10 cm  LV e' lateral:   5.92 cm/s LVOT diam:     1.90 cm  LV E/e' lateral: 8.7 LV SV:         65 LV SV Index:   42 LVOT Area:     2.84 cm  RIGHT VENTRICLE            IVC RV Basal diam:  2.90 cm    IVC diam: 1.50 cm RV S prime:     8.35 cm/s TAPSE (M-mode): 1.5 cm LEFT ATRIUM             Index       RIGHT ATRIUM           Index LA diam:        2.40 cm 1.53 cm/m  RA Area:     16.00 cm LA Vol (A2C):   47.3 ml 30.23 ml/m RA Volume:   39.30 ml  25.12 ml/m LA Vol (A4C):   35.2 ml 22.50 ml/m LA Biplane Vol: 41.7 ml 26.65 ml/m  AORTIC VALVE AV Area (Vmax):    1.13 cm AV Area (Vmean):   1.15 cm AV Area (VTI):     1.38 cm AV Vmax:           211.00 cm/s AV Vmean:          140.000 cm/s AV VTI:            0.473 m AV Peak Grad:      17.8 mmHg AV Mean Grad:      9.0 mmHg LVOT Vmax:         84.20 cm/s LVOT Vmean:        57.000 cm/s LVOT VTI:          0.230 m LVOT/AV VTI ratio: 0.49 AI PHT:  347 msec  AORTA Ao Root diam: 3.20 cm Ao Asc diam:  2.80 cm MITRAL VALVE MV Area (PHT):  2.73 cm    SHUNTS MV Area VTI:   1.76 cm    Systemic VTI:  0.23 m MV Peak grad:  4.6 mmHg    Systemic Diam: 1.90 cm MV Mean grad:  2.0 mmHg MV Vmax:       1.07 m/s MV Vmean:      58.3 cm/s MV Decel Time: 278 msec MV E velocity: 51.40 cm/s MV A velocity: 96.70 cm/s MV E/A ratio:  0.53 Dixie Dials MD Electronically signed by Dixie Dials MD Signature Date/Time: 07/19/2020/12:34:07 PM    Final      A total time of >45 minutes was spent preparing to see the patient, reviewing tests, x-rays, operative reports heart catheterization and other records.  Also, obtaining history and performing comprehensive physical exam.  Additionally, counseling the patient regarding the above listed issues.   Finally, documenting clinical information in the health records, coordination of care, educating the patient on coronary artery disease. It is a complex case.  Assessment & Plan:   Walker Kehr, MD

## 2020-08-05 NOTE — Assessment & Plan Note (Addendum)
On Zetia Follow-up with Dr Doylene Canard.  NSTEMI (non-ST elevated myocardial infarction) ([facility]) S/P Stent in RCA and LCx

## 2020-08-07 DIAGNOSIS — M79642 Pain in left hand: Secondary | ICD-10-CM | POA: Diagnosis not present

## 2020-08-07 DIAGNOSIS — M4712 Other spondylosis with myelopathy, cervical region: Secondary | ICD-10-CM | POA: Diagnosis not present

## 2020-08-07 DIAGNOSIS — R200 Anesthesia of skin: Secondary | ICD-10-CM | POA: Diagnosis not present

## 2020-08-07 DIAGNOSIS — M79641 Pain in right hand: Secondary | ICD-10-CM | POA: Diagnosis not present

## 2020-08-09 ENCOUNTER — Telehealth: Payer: Self-pay | Admitting: Internal Medicine

## 2020-08-09 DIAGNOSIS — I739 Peripheral vascular disease, unspecified: Secondary | ICD-10-CM

## 2020-08-09 NOTE — Telephone Encounter (Signed)
Patient referral to vascular surgeon. She states her cardiologist suggested the referral be made by PCP No preference

## 2020-08-11 DIAGNOSIS — G720 Drug-induced myopathy: Secondary | ICD-10-CM | POA: Insufficient documentation

## 2020-08-11 DIAGNOSIS — T466X5A Adverse effect of antihyperlipidemic and antiarteriosclerotic drugs, initial encounter: Secondary | ICD-10-CM | POA: Insufficient documentation

## 2020-08-11 NOTE — Assessment & Plan Note (Signed)
NSTEMI (non-ST elevated myocardial infarction) ([facility]) S/P Stent in RCA and LCx

## 2020-08-11 NOTE — Telephone Encounter (Signed)
Is this for coronary disease or something else? Thanks

## 2020-08-11 NOTE — Assessment & Plan Note (Signed)
Statin intolerant On Zetia NSTEMI (non-ST elevated myocardial infarction) ([place]) 2022 S/P Stent in RCA and LCx 2022

## 2020-08-12 NOTE — Telephone Encounter (Signed)
Called pt there was no answer LMOM RTC w/MD response.Marland Kitchenlmb

## 2020-08-13 NOTE — Telephone Encounter (Signed)
Hi Dr Doylene Canard, I am sorry to bother you.  Debra Farmer said that you want me to refer her to see a vascular surgeon.  She couldn't tell me why.  Do you remember why you need me to refer Debra Farmer to see a vascular surgeon? Thank you, AP

## 2020-08-13 NOTE — Telephone Encounter (Signed)
Patient states she is not sure why the referral to vascular surgeon needed, just that Dr. Doylene Canard asked that her Primary enter the referral.  Patient seen at [place] because of no feeling in both hands and he wants to do a nerve test Monday with Dr. Nelva Bush, probably will need the okay from Dr. Alain Marion  Dr. Janyth Contes ([facility]) will be doing the procedure if she has it.  Patient wants to return to work, but can't until her hands are better.

## 2020-08-15 NOTE — Telephone Encounter (Signed)
She has possibly decreased circulation in lower extremities as her DP/PT pulses were weak and may need ABI +/- vascular surgery referral

## 2020-08-16 NOTE — Telephone Encounter (Signed)
Will do.  Thank you, AP

## 2020-08-19 DIAGNOSIS — G5603 Carpal tunnel syndrome, bilateral upper limbs: Secondary | ICD-10-CM | POA: Diagnosis not present

## 2020-08-21 DIAGNOSIS — M13839 Other specified arthritis, unspecified wrist: Secondary | ICD-10-CM | POA: Diagnosis not present

## 2020-08-21 DIAGNOSIS — G5603 Carpal tunnel syndrome, bilateral upper limbs: Secondary | ICD-10-CM | POA: Diagnosis not present

## 2020-08-27 DIAGNOSIS — Z9861 Coronary angioplasty status: Secondary | ICD-10-CM | POA: Diagnosis not present

## 2020-08-27 DIAGNOSIS — I350 Nonrheumatic aortic (valve) stenosis: Secondary | ICD-10-CM | POA: Diagnosis not present

## 2020-08-27 DIAGNOSIS — I2510 Atherosclerotic heart disease of native coronary artery without angina pectoris: Secondary | ICD-10-CM | POA: Diagnosis not present

## 2020-08-27 DIAGNOSIS — J449 Chronic obstructive pulmonary disease, unspecified: Secondary | ICD-10-CM | POA: Diagnosis not present

## 2020-09-11 ENCOUNTER — Other Ambulatory Visit: Payer: Self-pay | Admitting: *Deleted

## 2020-09-11 DIAGNOSIS — I739 Peripheral vascular disease, unspecified: Secondary | ICD-10-CM

## 2020-09-17 DIAGNOSIS — G5601 Carpal tunnel syndrome, right upper limb: Secondary | ICD-10-CM | POA: Diagnosis not present

## 2020-09-23 ENCOUNTER — Encounter: Payer: 59 | Admitting: Vascular Surgery

## 2020-09-23 ENCOUNTER — Encounter (HOSPITAL_COMMUNITY): Payer: 59

## 2020-09-26 ENCOUNTER — Other Ambulatory Visit: Payer: Self-pay | Admitting: Internal Medicine

## 2020-09-30 ENCOUNTER — Other Ambulatory Visit: Payer: Self-pay | Admitting: Internal Medicine

## 2020-09-30 ENCOUNTER — Other Ambulatory Visit: Payer: Self-pay

## 2020-09-30 NOTE — Telephone Encounter (Signed)
Patient checking on status of the medication

## 2020-10-01 ENCOUNTER — Ambulatory Visit: Payer: PPO | Admitting: Internal Medicine

## 2020-10-01 DIAGNOSIS — M5416 Radiculopathy, lumbar region: Secondary | ICD-10-CM | POA: Diagnosis not present

## 2020-10-07 DIAGNOSIS — Z981 Arthrodesis status: Secondary | ICD-10-CM | POA: Diagnosis not present

## 2020-10-17 DIAGNOSIS — E663 Overweight: Secondary | ICD-10-CM | POA: Diagnosis not present

## 2020-10-17 DIAGNOSIS — Z79899 Other long term (current) drug therapy: Secondary | ICD-10-CM | POA: Diagnosis not present

## 2020-10-17 DIAGNOSIS — M2550 Pain in unspecified joint: Secondary | ICD-10-CM | POA: Diagnosis not present

## 2020-10-17 DIAGNOSIS — M0609 Rheumatoid arthritis without rheumatoid factor, multiple sites: Secondary | ICD-10-CM | POA: Diagnosis not present

## 2020-10-17 DIAGNOSIS — M150 Primary generalized (osteo)arthritis: Secondary | ICD-10-CM | POA: Diagnosis not present

## 2020-10-17 DIAGNOSIS — Z6825 Body mass index (BMI) 25.0-25.9, adult: Secondary | ICD-10-CM | POA: Diagnosis not present

## 2020-10-17 DIAGNOSIS — R5383 Other fatigue: Secondary | ICD-10-CM | POA: Diagnosis not present

## 2020-10-29 IMAGING — RF DG CERVICAL SPINE 2 OR 3 VIEWS
1 series · 4 of 4 positions shown · non-contrast
Comparison: Radiographs of the cervical spine 04/03/2020.

CLINICAL DATA: Surgery, elective. Additional history provided: C3-7
posterior fusion. Reported fluoroscopy time 1 minutes, 24 seconds
(9.86 mGy).

EXAM:
CERVICAL SPINE - 2-3 VIEW; DG C-ARM 1-60 MIN

[Series 1: run · 4 of 4 slices shown]
[im 1/4]
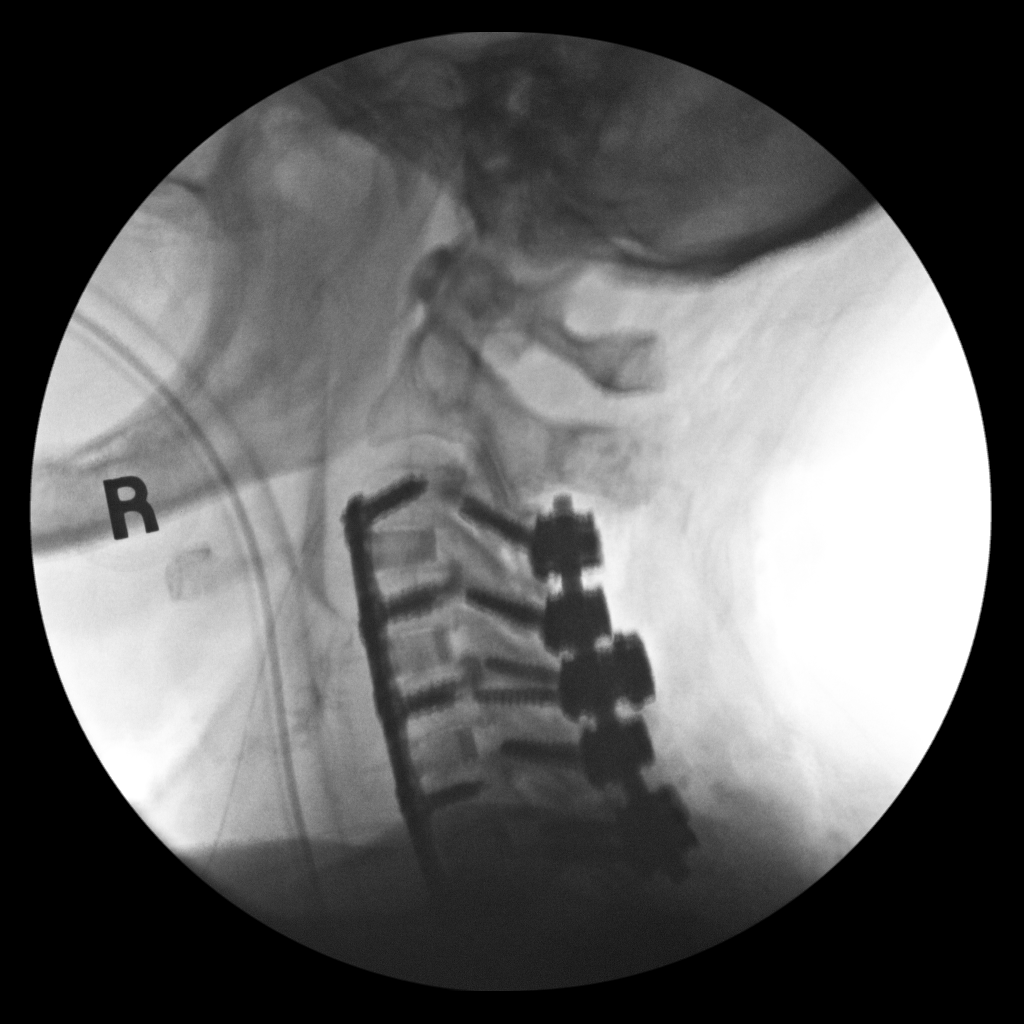
[im 2/4]
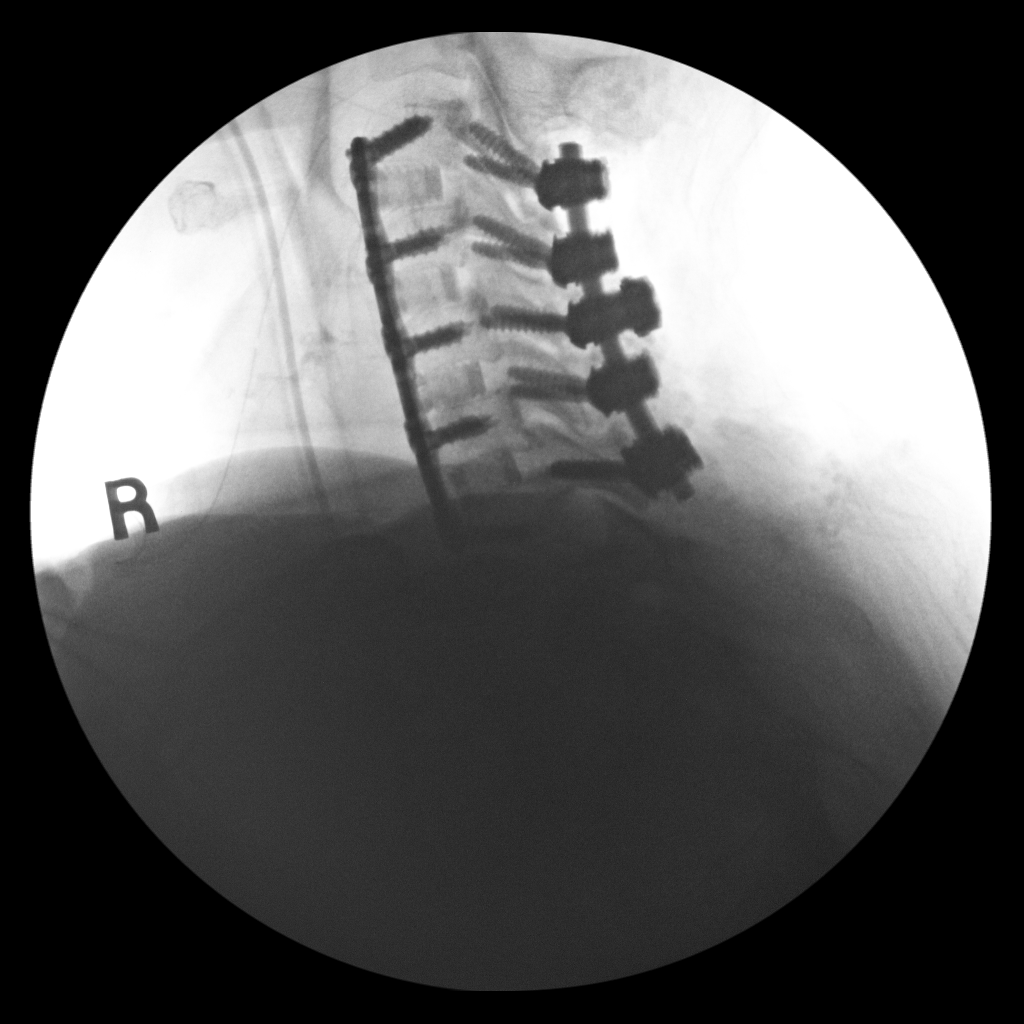
[im 3/4]
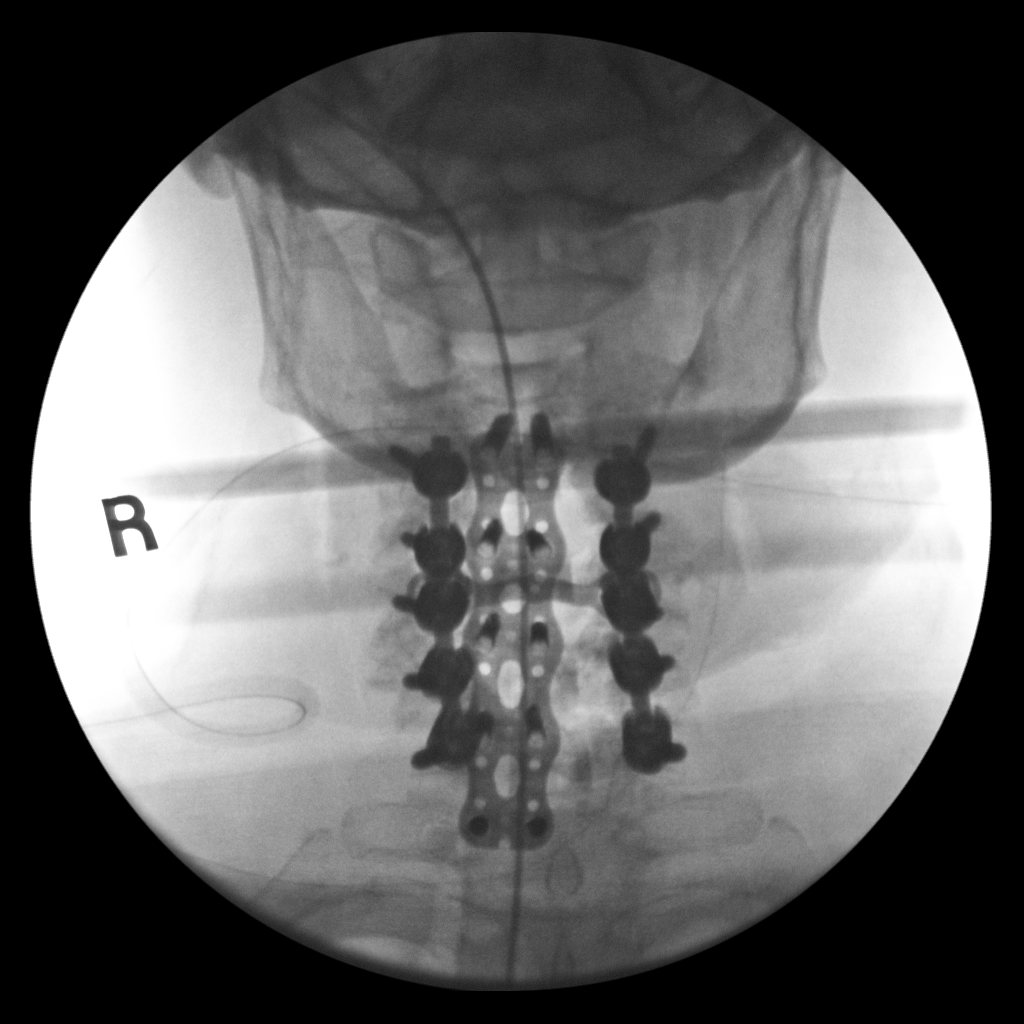
[im 4/4]
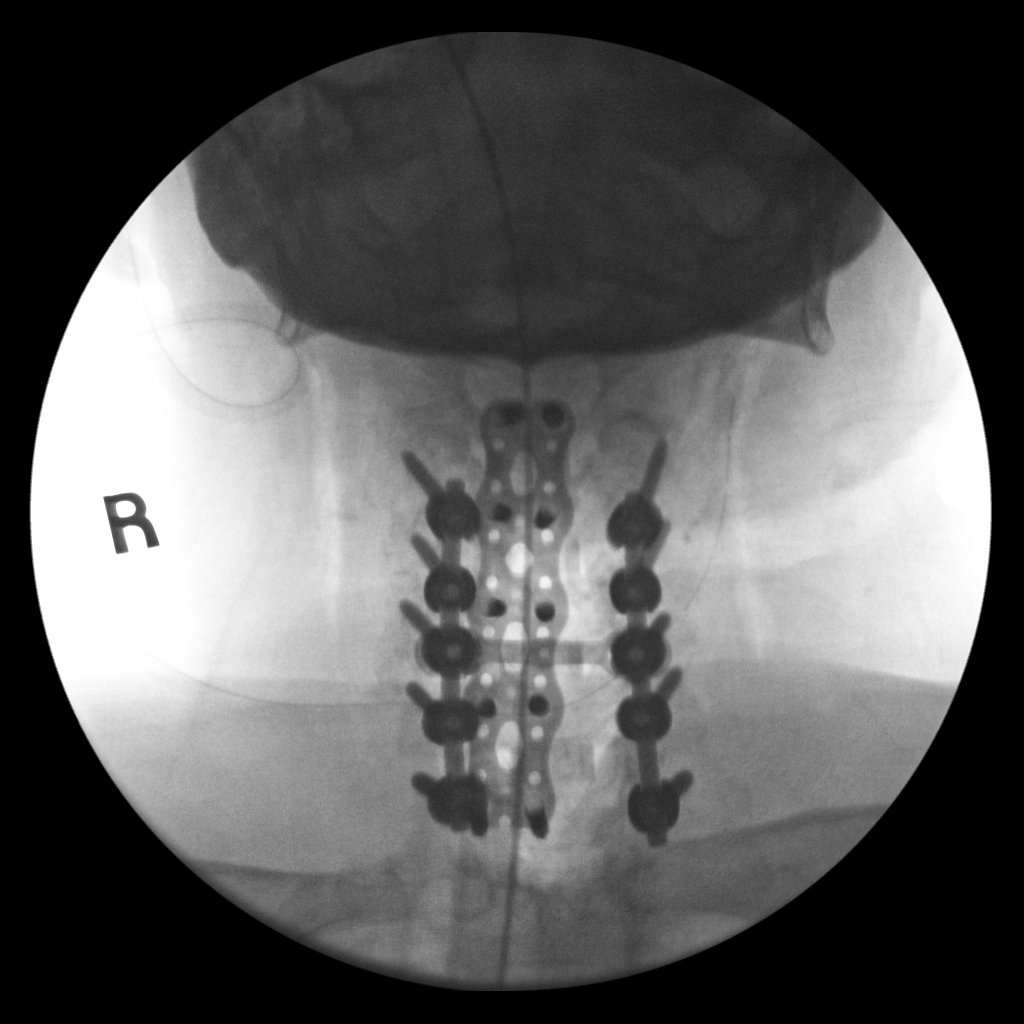

[4 of 4 positions shown; findings below may reference images not displayed]

FINDINGS: PA and lateral view intraoperative fluoroscopic images of the
cervical spine are submitted, 4 images total. The images demonstrate
a new posterior spinal fusion construct spanning the C3-C7 levels
utilizing bilateral screws and vertical interconnecting rods. No
unexpected finding. Redemonstrated sequela of prior C3-C7 ACDF.
Partially visualized support tubes.
IMPRESSION: Four intraoperative fluoroscopic images of the cervical spine as
described.

## 2020-11-04 ENCOUNTER — Encounter: Payer: Self-pay | Admitting: Internal Medicine

## 2020-11-04 ENCOUNTER — Other Ambulatory Visit: Payer: Self-pay

## 2020-11-04 ENCOUNTER — Ambulatory Visit (INDEPENDENT_AMBULATORY_CARE_PROVIDER_SITE_OTHER): Payer: PPO | Admitting: Internal Medicine

## 2020-11-04 DIAGNOSIS — E785 Hyperlipidemia, unspecified: Secondary | ICD-10-CM

## 2020-11-04 DIAGNOSIS — G629 Polyneuropathy, unspecified: Secondary | ICD-10-CM | POA: Insufficient documentation

## 2020-11-04 DIAGNOSIS — G6181 Chronic inflammatory demyelinating polyneuritis: Secondary | ICD-10-CM

## 2020-11-04 DIAGNOSIS — M130 Polyarthritis, unspecified: Secondary | ICD-10-CM | POA: Diagnosis not present

## 2020-11-04 DIAGNOSIS — I2510 Atherosclerotic heart disease of native coronary artery without angina pectoris: Secondary | ICD-10-CM | POA: Diagnosis not present

## 2020-11-04 MED ORDER — TRAMADOL HCL 50 MG PO TABS: 50.0000 mg | ORAL_TABLET | Freq: Four times a day (QID) | ORAL | 3 refills | Status: DC | PRN

## 2020-11-04 MED ORDER — ZOLPIDEM TARTRATE 10 MG PO TABS: ORAL_TABLET | ORAL | 1 refills | Status: DC

## 2020-11-04 NOTE — Assessment & Plan Note (Addendum)
C/o numbness, no feeling in B hands Pt saw Dr Rolena Infante, Dr Amedeo Plenty Gabapentin caused side effects Try Lion's mane

## 2020-11-04 NOTE — Assessment & Plan Note (Signed)
On  Red Rice yeast

## 2020-11-04 NOTE — Assessment & Plan Note (Signed)
Due to RA/OA Tramadol prn  Potential benefits of a long term opioids use as well as potential risks (i.e. addiction risk, apnea etc) and complications (i.e. Somnolence, constipation and others) were explained to the patient and were aknowledged.

## 2020-11-04 NOTE — Patient Instructions (Signed)
You can try Lion's Mane Mushroom capsules for memory problems, decreased focus, mental fog, neuropathy ([employer])   

## 2020-11-04 NOTE — Assessment & Plan Note (Signed)
NSTEMI (non-ST elevated myocardial infarction) ([place]) S/P Stent in RCA and LCx On Brilinta, Coreg, Red Rice yeast

## 2020-11-04 NOTE — Progress Notes (Signed)
Subjective:  Patient ID: Debra Farmer, female    DOB: 08-Dec-1945  Age: 75 y.o. MRN: [id number]  CC: Follow-up (3 month f/u)   HPI Debra Farmer presents for fatigue, CAD, RA C/o numbness, no feeling in B hands Pt saw Dr Rolena Infante, Dr Amedeo Plenty  Outpatient Medications Prior to Visit  Medication Sig Dispense Refill  . Antarctic Krill Oil 500 MG CAPS Take 1 capsule by mouth 2 (two) times daily.    [name] aspirin EC 81 MG EC tablet Take 1 tablet (81 mg total) by mouth daily. Swallow whole. 30 tablet 11  . BIOTIN PO Take 2 tablets by mouth daily. gummy    . carvedilol (COREG) 6.25 MG tablet Take 1 tablet (6.25 mg total) by mouth 2 (two) times daily with a meal. 60 tablet 3  . Cholecalciferol (VITAMIN D3) 5000 units TABS Take 5,000 Units by mouth in the morning and at bedtime. WITH A GLASS OF VANILLA-HONEY ALMOND MILK    . Coenzyme Q10 (COQ10) 100 MG CAPS Take 1 capsule by mouth in the morning, at noon, in the evening, and at bedtime.    . Cyanocobalamin (VITAMIN B-12) 5000 MCG TBDP Take 5,000 mcg by mouth daily.    [name] diltiazem (CARDIZEM) 30 MG tablet Take 1 tablet (30 mg total) by mouth every 12 (twelve) hours. 60 tablet 3  . ezetimibe (ZETIA) 10 MG tablet Take 1 tablet (10 mg total) by mouth daily. 30 tablet 3  . leflunomide (ARAVA) 20 MG tablet Take 20 mg by mouth daily.    . Melatonin 3 MG TABS Take 3 mg by mouth at bedtime as needed (for sleep).    . Multiple Vitamins-Minerals (MULTIVITAMIN WITH MINERALS) tablet Take 1 tablet by mouth daily. Immune    . nitroGLYCERIN (NITROSTAT) 0.4 MG SL tablet Place 1 tablet (0.4 mg total) under the tongue every 5 (five) minutes x 3 doses as needed for chest pain. 25 tablet 1  . ondansetron (ZOFRAN) 4 MG tablet Take 1 tablet (4 mg total) by mouth every 8 (eight) hours as needed for nausea or vomiting. 20 tablet 0  . Red Yeast Rice 600 MG TABS Take 1 tablet by mouth 2 (two) times daily.    . Sarilumab (KEVZARA) 200 MG/1.14ML SOAJ Inject 1 Dose into the  skin See admin instructions. Every 2 weeks    . ticagrelor (BRILINTA) 90 MG TABS tablet Take 1 tablet (90 mg total) by mouth 2 (two) times daily. 60 tablet 3  . traMADol (ULTRAM) 50 MG tablet TAKE 1 TABLET BY MOUTH EVERY 6 HOURS AS NEEDED 120 tablet 3  . zolpidem (AMBIEN) 10 MG tablet TAKE 1 TABLET BY MOUTH DAILY AT BEDTIME FOR SLEEP 90 tablet 1   No facility-administered medications prior to visit.    ROS: Review of Systems  Constitutional: Positive for fatigue. Negative for activity change, appetite change, chills, diaphoresis, fever and unexpected weight change.  HENT: Negative for congestion, dental problem, ear pain, hearing loss, mouth sores, postnasal drip, sinus pressure, sneezing, sore throat and voice change.   Eyes: Negative for pain and visual disturbance.  Respiratory: Negative for cough, chest tightness, wheezing and stridor.   Cardiovascular: Negative for chest pain, palpitations and leg swelling.  Gastrointestinal: Negative for abdominal distention, abdominal pain, blood in stool, nausea, rectal pain and vomiting.  Genitourinary: Negative for decreased urine volume, difficulty urinating, dysuria, frequency, hematuria, menstrual problem, vaginal bleeding, vaginal discharge and vaginal pain.  Musculoskeletal: Positive for arthralgias, back pain, neck pain and neck  stiffness. Negative for gait problem and joint swelling.  Skin: Negative for color change, rash and wound.  Neurological: Positive for numbness. Negative for dizziness, tremors, syncope, speech difficulty, weakness and light-headedness.  Hematological: Negative for adenopathy. Bruises/bleeds easily.  Psychiatric/Behavioral: Negative for behavioral problems, confusion, decreased concentration, dysphoric mood, hallucinations, sleep disturbance and suicidal ideas. The patient is not nervous/anxious and is not hyperactive.     Objective:  BP (!) 142/70 (BP Location: Left Arm)   Pulse 89   Temp 98.6 F (37 C) (Oral)    Ht 5\' 2"  (1.575 m)   Wt 132 lb 13.6 oz (60.3 kg)   SpO2 96%   BMI 24.30 kg/m   BP Readings from Last 3 Encounters:  11/04/20 (!) 142/70  08/05/20 130/70  07/21/20 102/71    Wt Readings from Last 3 Encounters:  11/04/20 132 lb 13.6 oz (60.3 kg)  08/05/20 134 lb (60.8 kg)  07/21/20 137 lb 2 oz (62.2 kg)    Physical Exam Constitutional:      General: She is not in acute distress.    Appearance: Normal appearance. She is well-developed.  HENT:     Head: Normocephalic.     Right Ear: External ear normal.     Left Ear: External ear normal.     Nose: Nose normal.  Eyes:     General:        Right eye: No discharge.        Left eye: No discharge.     Conjunctiva/sclera: Conjunctivae normal.     Pupils: Pupils are equal, round, and reactive to light.  Neck:     Thyroid: No thyromegaly.     Vascular: No JVD.     Trachea: No tracheal deviation.  Cardiovascular:     Rate and Rhythm: Normal rate and regular rhythm.     Heart sounds: Normal heart sounds.  Pulmonary:     Effort: No respiratory distress.     Breath sounds: No stridor. No wheezing.  Abdominal:     General: Bowel sounds are normal. There is no distension.     Palpations: Abdomen is soft. There is no mass.     Tenderness: There is no abdominal tenderness. There is no guarding or rebound.  Musculoskeletal:        General: Tenderness present.     Cervical back: Normal range of motion and neck supple.  Lymphadenopathy:     Cervical: No cervical adenopathy.  Skin:    Findings: Bruising present. No erythema or rash.  Neurological:     Mental Status: She is oriented to person, place, and time.     Cranial Nerves: No cranial nerve deficit.     Motor: No abnormal muscle tone.     Coordination: Coordination normal.     Deep Tendon Reflexes: Reflexes normal.  Psychiatric:        Behavior: Behavior normal.        Thought Content: Thought content normal.        Judgment: Judgment normal.     Lab Results  Component  Value Date   WBC 3.9 (L) 07/21/2020   HGB 11.6 (L) 07/21/2020   HCT 36.0 07/21/2020   PLT 171 07/21/2020   GLUCOSE 115 (H) 07/20/2020   CHOL 275 (H) 07/20/2020   TRIG 321 (H) 07/20/2020   HDL 40 (L) 07/20/2020   LDLDIRECT 170.0 12/05/2014   LDLCALC 171 (H) 07/20/2020   ALT 31 07/19/2020   AST 26 07/19/2020   NA 137 07/20/2020  K 3.9 07/20/2020   CL 105 07/20/2020   CREATININE 0.58 07/20/2020   BUN 6 (L) 07/20/2020   CO2 23 07/20/2020   TSH 1.83 01/23/2020   INR 0.9 04/01/2020   HGBA1C 5.9 (H) 07/20/2020    CARDIAC CATHETERIZATION  Result Date: 07/19/2020  1st Diag lesion is 70% stenosed.  2nd Diag lesion is 75% stenosed.  Mid Cx lesion is 90% stenosed.  Prox RCA to Mid RCA lesion is 85% stenosed.  Dr. Tamala Julian placed 3.5 x 30 Onyx drug eluting stent in RCA, reduced 85 % lesion to 0 % with TIMI grade III flow and 2.5 x 12 mm Onyx stent in LCx, reduced 90 % lesion to 0 % with TIMI grade 3 flow.   CARDIAC CATHETERIZATION  Result Date: 07/19/2020  A stent was successfully placed.   Complicated procedure due to unfamiliarity with the patient's clinical history, ongoing severe chest discomfort, and technical issues associated with the left circumflex intervention.  85% mid right coronary with TIMI III flow was reduced to 10% with TIMI grade III flow using a 30 x 3.5 Onyx deployed at high pressure.  90% mid circumflex stenosis with TIMI grade II-III flow treated with a 2.5 x 12 mm Onyx reducing stenosis to less than 10% with TIMI grade III flow. RECOMMENDATIONS:  1-month duration of dual antiplatelet therapy.  Aspirin and Brilinta for now.  Can switch to clopidogrel and drop Brilinta after 30 days.  Management, general orders, and clinical follow-up per Dr. Dixie Dials.   DG Chest Port 1 View  Result Date: 07/19/2020 CLINICAL DATA:  Chest pain which started a few hours ago EXAM: PORTABLE CHEST 1 VIEW COMPARISON:  Radiograph 04/01/2020, CT 09/21/2017 FINDINGS: Slightly diminished  lung volumes with some streaky opacities in the lung bases, increased from comparison. No focal consolidation. No pneumothorax or visible effusion. The aorta is calcified. The remaining cardiomediastinal contours are unremarkable. No acute osseous or soft tissue abnormality. Prior cervical fusion, incompletely assessed on this exam. Telemetry leads overlie the chest. IMPRESSION: Slightly diminished lung volumes with some streaky opacities in the lung bases, favor atelectasis. No other acute cardiopulmonary abnormality. Electronically Signed   By: Lovena Le M.D.   On: 07/19/2020 04:23   ECHOCARDIOGRAM COMPLETE  Result Date: 07/19/2020    ECHOCARDIOGRAM REPORT   Patient Name:   MARZETTA LANZA Date of Exam: 07/19/2020 Medical Rec #:  [id number]         Height:       60.0 in Accession #:    [id number]        Weight:       132.0 lb Date of Birth:  12-18-1945        BSA:          1.564 m Patient Age:    39 years          BP:           135/78 mmHg Patient Gender: F                 HR:           75 bpm. Exam Location:  Inpatient Procedure: 2D Echo, Cardiac Doppler and Color Doppler Indications:     CAD Native Vessel  History:         Patient has no prior history of Echocardiogram examinations.                  COPD; Risk Factors:Dyslipidemia and Former Smoker. GERD.  Sonographer:  Clayton Lefort RDCS (AE) Referring Phys:  [place] Diagnosing Phys: Dixie Dials MD IMPRESSIONS  1. Left ventricular ejection fraction, by estimation, is 55 to 60%. The left ventricle has normal function. The left ventricle has no regional wall motion abnormalities. There is mild concentric left ventricular hypertrophy. Left ventricular diastolic parameters are consistent with Grade I diastolic dysfunction (impaired relaxation).  2. Right ventricular systolic function is normal. The right ventricular size is normal. Mildly increased right ventricular wall thickness.  3. Left atrial size was mildly dilated.  4. Right atrial size was  mildly dilated.  5. The mitral valve is degenerative. Mild mitral valve regurgitation.  6. The aortic valve is tricuspid. There is moderate calcification of the aortic valve. There is moderate thickening of the aortic valve. Aortic valve regurgitation is mild. Mild aortic valve stenosis.  7. There is mild (Grade II) atheroma plaque involving the aortic root and ascending aorta.  8. The inferior vena cava is normal in size with greater than 50% respiratory variability, suggesting right atrial pressure of 3 mmHg. FINDINGS  Left Ventricle: Left ventricular ejection fraction, by estimation, is 55 to 60%. The left ventricle has normal function. The left ventricle has no regional wall motion abnormalities. The left ventricular internal cavity size was normal in size. There is  mild concentric left ventricular hypertrophy. Left ventricular diastolic parameters are consistent with Grade I diastolic dysfunction (impaired relaxation). Right Ventricle: The right ventricular size is normal. Mildly increased right ventricular wall thickness. Right ventricular systolic function is normal. Left Atrium: Left atrial size was mildly dilated. Right Atrium: Right atrial size was mildly dilated. Pericardium: There is no evidence of pericardial effusion. Mitral Valve: The mitral valve is degenerative in appearance. There is mild thickening of the mitral valve leaflet(s). There is mild calcification of the mitral valve leaflet(s). Mild mitral annular calcification. Mild mitral valve regurgitation. MV peak  gradient, 4.6 mmHg. The mean mitral valve gradient is 2.0 mmHg. Tricuspid Valve: The tricuspid valve is normal in structure. Tricuspid valve regurgitation is mild. Aortic Valve: The aortic valve is tricuspid. There is moderate calcification of the aortic valve. There is moderate thickening of the aortic valve. There is mild aortic valve annular calcification. Aortic valve regurgitation is mild. Aortic regurgitation  PHT measures 347  msec. Mild aortic stenosis is present. Aortic valve mean gradient measures 9.0 mmHg. Aortic valve peak gradient measures 17.8 mmHg. Aortic valve area, by VTI measures 1.38 cm. Pulmonic Valve: The pulmonic valve was normal in structure. Pulmonic valve regurgitation is not visualized. Aorta: The aortic root is normal in size and structure. There is mild (Grade II) atheroma plaque involving the aortic root and ascending aorta. Venous: The inferior vena cava is normal in size with greater than 50% respiratory variability, suggesting right atrial pressure of 3 mmHg. IAS/Shunts: No atrial level shunt detected by color flow Doppler.  LEFT VENTRICLE PLAX 2D LVIDd:         3.60 cm  Diastology LVIDs:         2.50 cm  LV e' medial:    4.62 cm/s LV PW:         1.40 cm  LV E/e' medial:  11.1 LV IVS:        1.10 cm  LV e' lateral:   5.92 cm/s LVOT diam:     1.90 cm  LV E/e' lateral: 8.7 LV SV:         65 LV SV Index:   42 LVOT Area:  2.84 cm  RIGHT VENTRICLE            IVC RV Basal diam:  2.90 cm    IVC diam: 1.50 cm RV S prime:     8.35 cm/s TAPSE (M-mode): 1.5 cm LEFT ATRIUM             Index       RIGHT ATRIUM           Index LA diam:        2.40 cm 1.53 cm/m  RA Area:     16.00 cm LA Vol (A2C):   47.3 ml 30.23 ml/m RA Volume:   39.30 ml  25.12 ml/m LA Vol (A4C):   35.2 ml 22.50 ml/m LA Biplane Vol: 41.7 ml 26.65 ml/m  AORTIC VALVE AV Area (Vmax):    1.13 cm AV Area (Vmean):   1.15 cm AV Area (VTI):     1.38 cm AV Vmax:           211.00 cm/s AV Vmean:          140.000 cm/s AV VTI:            0.473 m AV Peak Grad:      17.8 mmHg AV Mean Grad:      9.0 mmHg LVOT Vmax:         84.20 cm/s LVOT Vmean:        57.000 cm/s LVOT VTI:          0.230 m LVOT/AV VTI ratio: 0.49 AI PHT:            347 msec  AORTA Ao Root diam: 3.20 cm Ao Asc diam:  2.80 cm MITRAL VALVE MV Area (PHT): 2.73 cm    SHUNTS MV Area VTI:   1.76 cm    Systemic VTI:  0.23 m MV Peak grad:  4.6 mmHg    Systemic Diam: 1.90 cm MV Mean grad:  2.0 mmHg MV  Vmax:       1.07 m/s MV Vmean:      58.3 cm/s MV Decel Time: 278 msec MV E velocity: 51.40 cm/s MV A velocity: 96.70 cm/s MV E/A ratio:  0.53 Dixie Dials MD Electronically signed by Dixie Dials MD Signature Date/Time: 07/19/2020/12:34:07 PM    Final     Assessment & Plan:   There are no diagnoses linked to this encounter.   No orders of the defined types were placed in this encounter.    Follow-up: No follow-ups on file.  Walker Kehr, MD

## 2020-12-13 DIAGNOSIS — Z4889 Encounter for other specified surgical aftercare: Secondary | ICD-10-CM | POA: Diagnosis not present

## 2020-12-13 DIAGNOSIS — M4187 Other forms of scoliosis, lumbosacral region: Secondary | ICD-10-CM | POA: Diagnosis not present

## 2020-12-13 DIAGNOSIS — M5136 Other intervertebral disc degeneration, lumbar region: Secondary | ICD-10-CM | POA: Diagnosis not present

## 2020-12-13 DIAGNOSIS — M4310 Spondylolisthesis, site unspecified: Secondary | ICD-10-CM | POA: Diagnosis not present

## 2021-01-02 DIAGNOSIS — M5416 Radiculopathy, lumbar region: Secondary | ICD-10-CM | POA: Diagnosis not present

## 2021-01-22 ENCOUNTER — Ambulatory Visit: Payer: PPO

## 2021-02-05 ENCOUNTER — Other Ambulatory Visit: Payer: Self-pay

## 2021-02-05 ENCOUNTER — Ambulatory Visit (INDEPENDENT_AMBULATORY_CARE_PROVIDER_SITE_OTHER): Payer: PPO | Admitting: Internal Medicine

## 2021-02-05 ENCOUNTER — Encounter: Payer: Self-pay | Admitting: Internal Medicine

## 2021-02-05 DIAGNOSIS — G959 Disease of spinal cord, unspecified: Secondary | ICD-10-CM | POA: Diagnosis not present

## 2021-02-05 MED ORDER — TRAMADOL HCL 50 MG PO TABS: 50.0000 mg | ORAL_TABLET | Freq: Four times a day (QID) | ORAL | 3 refills | Status: DC | PRN

## 2021-02-05 MED ORDER — ZOLPIDEM TARTRATE 10 MG PO TABS: ORAL_TABLET | ORAL | 1 refills | Status: DC

## 2021-02-05 MED ORDER — FUROSEMIDE 20 MG PO TABS: 20.0000 mg | ORAL_TABLET | Freq: Every day | ORAL | 5 refills | Status: DC | PRN

## 2021-02-05 NOTE — Assessment & Plan Note (Signed)
Not better B hand numbness - worse after neck surgery 2021 Try TENS unit, hand massager S/p CTS release (R - 2022) - did not help Tramadol prn

## 2021-02-05 NOTE — Progress Notes (Signed)
Subjective:  Patient ID: Debra Farmer, female    DOB: 20-Feb-1946  Age: 75 y.o. MRN: [id number]  CC: Foot Swelling   HPI LAKIA RUMRILL presents for feet swelling - worse F/u on insomnia, LBP/RA  Outpatient Medications Prior to Visit  Medication Sig Dispense Refill   Antarctic Krill Oil 500 MG CAPS Take 1 capsule by mouth 2 (two) times daily.     aspirin EC 81 MG EC tablet Take 1 tablet (81 mg total) by mouth daily. Swallow whole. 30 tablet 11   BIOTIN PO Take 2 tablets by mouth daily. gummy     Cholecalciferol (VITAMIN D3) 5000 units TABS Take 5,000 Units by mouth in the morning and at bedtime. WITH A GLASS OF VANILLA-HONEY ALMOND MILK     Coenzyme Q10 (COQ10) 100 MG CAPS Take 1 capsule by mouth in the morning, at noon, in the evening, and at bedtime.     Cyanocobalamin (VITAMIN B-12) 5000 MCG TBDP Take 5,000 mcg by mouth daily.     diltiazem (CARDIZEM) 30 MG tablet Take 1 tablet (30 mg total) by mouth every 12 (twelve) hours. 60 tablet 3   ezetimibe (ZETIA) 10 MG tablet Take 1 tablet (10 mg total) by mouth daily. 30 tablet 3   leflunomide (ARAVA) 20 MG tablet Take 20 mg by mouth daily.     Melatonin 3 MG TABS Take 3 mg by mouth at bedtime as needed (for sleep).     Multiple Vitamins-Minerals (MULTIVITAMIN WITH MINERALS) tablet Take 1 tablet by mouth daily. Immune     nitroGLYCERIN (NITROSTAT) 0.4 MG SL tablet Place 1 tablet (0.4 mg total) under the tongue every 5 (five) minutes x 3 doses as needed for chest pain. 25 tablet 1   ondansetron (ZOFRAN) 4 MG tablet Take 1 tablet (4 mg total) by mouth every 8 (eight) hours as needed for nausea or vomiting. 20 tablet 0   Red Yeast Rice 600 MG TABS Take 1 tablet by mouth 2 (two) times daily.     Sarilumab (KEVZARA) 200 MG/1.14ML SOAJ Inject 1 Dose into the skin See admin instructions. Every 2 weeks     ticagrelor (BRILINTA) 90 MG TABS tablet Take 1 tablet (90 mg total) by mouth 2 (two) times daily. 60 tablet 3   traMADol (ULTRAM) 50 MG  tablet Take 1 tablet (50 mg total) by mouth every 6 (six) hours as needed. 120 tablet 3   zolpidem (AMBIEN) 10 MG tablet TAKE 1 TABLET BY MOUTH DAILY AT BEDTIME FOR SLEEP 90 tablet 1   carvedilol (COREG) 6.25 MG tablet Take 1 tablet (6.25 mg total) by mouth 2 (two) times daily with a meal. (Patient not taking: Reported on 02/05/2021) 60 tablet 3   No facility-administered medications prior to visit.    ROS: Review of Systems  Constitutional:  Negative for activity change, appetite change, chills, fatigue and unexpected weight change.  HENT:  Negative for congestion, mouth sores and sinus pressure.   Eyes:  Negative for visual disturbance.  Respiratory:  Positive for cough. Negative for chest tightness.   Gastrointestinal:  Negative for abdominal pain and nausea.  Genitourinary:  Negative for difficulty urinating, frequency and vaginal pain.  Musculoskeletal:  Positive for arthralgias, back pain and gait problem.  Skin:  Negative for pallor and rash.  Neurological:  Negative for dizziness, tremors, weakness, numbness and headaches.  Psychiatric/Behavioral:  Positive for decreased concentration and sleep disturbance. Negative for confusion and suicidal ideas.    Objective:  BP 122/68 (BP  Location: Left Arm)   Pulse 72   Temp 98 F (36.7 C) (Oral)   Ht '5\' 2"'$  (1.575 m)   Wt 141 lb 9.6 oz (64.2 kg)   SpO2 98%   BMI 25.90 kg/m   BP Readings from Last 3 Encounters:  02/05/21 122/68  11/04/20 (!) 142/70  08/05/20 130/70    Wt Readings from Last 3 Encounters:  02/05/21 141 lb 9.6 oz (64.2 kg)  11/04/20 132 lb 13.6 oz (60.3 kg)  08/05/20 134 lb (60.8 kg)    Physical Exam Constitutional:      General: She is not in acute distress.    Appearance: She is well-developed.  HENT:     Head: Normocephalic.     Right Ear: External ear normal.     Left Ear: External ear normal.     Nose: Nose normal.  Eyes:     General:        Right eye: No discharge.        Left eye: No discharge.      Conjunctiva/sclera: Conjunctivae normal.     Pupils: Pupils are equal, round, and reactive to light.  Neck:     Thyroid: No thyromegaly.     Vascular: No JVD.     Trachea: No tracheal deviation.  Cardiovascular:     Rate and Rhythm: Normal rate and regular rhythm.     Heart sounds: Normal heart sounds.  Pulmonary:     Effort: No respiratory distress.     Breath sounds: No stridor. No wheezing.  Abdominal:     General: Bowel sounds are normal. There is no distension.     Palpations: Abdomen is soft. There is no mass.     Tenderness: There is no abdominal tenderness. There is no guarding or rebound.  Musculoskeletal:        General: Tenderness present.     Cervical back: Normal range of motion and neck supple. No rigidity.     Right lower leg: Edema present.     Left lower leg: Edema (trace to 1+ B) present.  Lymphadenopathy:     Cervical: No cervical adenopathy.  Skin:    Findings: No erythema or rash.  Neurological:     Cranial Nerves: No cranial nerve deficit.     Motor: No abnormal muscle tone.     Coordination: Coordination normal.     Deep Tendon Reflexes: Reflexes normal.  Psychiatric:        Behavior: Behavior normal.        Thought Content: Thought content normal.        Judgment: Judgment normal.    Lab Results  Component Value Date   WBC 3.9 (L) 07/21/2020   HGB 11.6 (L) 07/21/2020   HCT 36.0 07/21/2020   PLT 171 07/21/2020   GLUCOSE 115 (H) 07/20/2020   CHOL 275 (H) 07/20/2020   TRIG 321 (H) 07/20/2020   HDL 40 (L) 07/20/2020   LDLDIRECT 170.0 12/05/2014   LDLCALC 171 (H) 07/20/2020   ALT 31 07/19/2020   AST 26 07/19/2020   NA 137 07/20/2020   K 3.9 07/20/2020   CL 105 07/20/2020   CREATININE 0.58 07/20/2020   BUN 6 (L) 07/20/2020   CO2 23 07/20/2020   TSH 1.83 01/23/2020   INR 0.9 04/01/2020   HGBA1C 5.9 (H) 07/20/2020    CARDIAC CATHETERIZATION  Result Date: 07/19/2020  1st Diag lesion is 70% stenosed.  2nd Diag lesion is 75% stenosed.   Mid Cx lesion is 90%  stenosed.  Prox RCA to Mid RCA lesion is 85% stenosed.  Dr. Tamala Julian placed 3.5 x 30 Onyx drug eluting stent in RCA, reduced 85 % lesion to 0 % with TIMI grade III flow and 2.5 x 12 mm Onyx stent in LCx, reduced 90 % lesion to 0 % with TIMI grade 3 flow.   CARDIAC CATHETERIZATION  Result Date: 07/19/2020  A stent was successfully placed.   Complicated procedure due to unfamiliarity with the patient's clinical history, ongoing severe chest discomfort, and technical issues associated with the left circumflex intervention.  85% mid right coronary with TIMI III flow was reduced to 10% with TIMI grade III flow using a 30 x 3.5 Onyx deployed at high pressure.  90% mid circumflex stenosis with TIMI grade II-III flow treated with a 2.5 x 12 mm Onyx reducing stenosis to less than 10% with TIMI grade III flow. RECOMMENDATIONS:  2-monthduration of dual antiplatelet therapy.  Aspirin and Brilinta for now.  Can switch to clopidogrel and drop Brilinta after 30 days.  Management, general orders, and clinical follow-up per Dr. ADixie Dials   DG Chest Port 1 View  Result Date: 07/19/2020 CLINICAL DATA:  Chest pain which started a few hours ago EXAM: PORTABLE CHEST 1 VIEW COMPARISON:  Radiograph 04/01/2020, CT 09/21/2017 FINDINGS: Slightly diminished lung volumes with some streaky opacities in the lung bases, increased from comparison. No focal consolidation. No pneumothorax or visible effusion. The aorta is calcified. The remaining cardiomediastinal contours are unremarkable. No acute osseous or soft tissue abnormality. Prior cervical fusion, incompletely assessed on this exam. Telemetry leads overlie the chest. IMPRESSION: Slightly diminished lung volumes with some streaky opacities in the lung bases, favor atelectasis. No other acute cardiopulmonary abnormality. Electronically Signed   By: PLovena LeM.D.   On: 07/19/2020 04:23   ECHOCARDIOGRAM COMPLETE  Result Date: 07/19/2020     ECHOCARDIOGRAM REPORT   Patient Name:   PKAROLYNE BUECHELDate of Exam: 07/19/2020 Medical Rec #:  [id number]        Height:       60.0 in Accession #:    [id number]       Weight:       132.0 lb Date of Birth:  1October 19, 1947       BSA:          1.564 m Patient Age:    89years          BP:           135/78 mmHg Patient Gender: F                 HR:           75 bpm. Exam Location:  Inpatient Procedure: 2D Echo, Cardiac Doppler and Color Doppler Indications:     CAD Native Vessel  History:         Patient has no prior history of Echocardiogram examinations.                  COPD; Risk Factors:Dyslipidemia and Former Smoker. GERD.  Sonographer:     AClayton LefortRDCS (AE) Referring Phys:  1FosterDiagnosing Phys: ADixie DialsMD IMPRESSIONS  1. Left ventricular ejection fraction, by estimation, is 55 to 60%. The left ventricle has normal function. The left ventricle has no regional wall motion abnormalities. There is mild concentric left ventricular hypertrophy. Left ventricular diastolic parameters are consistent with Grade I diastolic dysfunction (impaired relaxation).  2. Right  ventricular systolic function is normal. The right ventricular size is normal. Mildly increased right ventricular wall thickness.  3. Left atrial size was mildly dilated.  4. Right atrial size was mildly dilated.  5. The mitral valve is degenerative. Mild mitral valve regurgitation.  6. The aortic valve is tricuspid. There is moderate calcification of the aortic valve. There is moderate thickening of the aortic valve. Aortic valve regurgitation is mild. Mild aortic valve stenosis.  7. There is mild (Grade II) atheroma plaque involving the aortic root and ascending aorta.  8. The inferior vena cava is normal in size with greater than 50% respiratory variability, suggesting right atrial pressure of 3 mmHg. FINDINGS  Left Ventricle: Left ventricular ejection fraction, by estimation, is 55 to 60%. The left ventricle has normal function. The  left ventricle has no regional wall motion abnormalities. The left ventricular internal cavity size was normal in size. There is  mild concentric left ventricular hypertrophy. Left ventricular diastolic parameters are consistent with Grade I diastolic dysfunction (impaired relaxation). Right Ventricle: The right ventricular size is normal. Mildly increased right ventricular wall thickness. Right ventricular systolic function is normal. Left Atrium: Left atrial size was mildly dilated. Right Atrium: Right atrial size was mildly dilated. Pericardium: There is no evidence of pericardial effusion. Mitral Valve: The mitral valve is degenerative in appearance. There is mild thickening of the mitral valve leaflet(s). There is mild calcification of the mitral valve leaflet(s). Mild mitral annular calcification. Mild mitral valve regurgitation. MV peak  gradient, 4.6 mmHg. The mean mitral valve gradient is 2.0 mmHg. Tricuspid Valve: The tricuspid valve is normal in structure. Tricuspid valve regurgitation is mild. Aortic Valve: The aortic valve is tricuspid. There is moderate calcification of the aortic valve. There is moderate thickening of the aortic valve. There is mild aortic valve annular calcification. Aortic valve regurgitation is mild. Aortic regurgitation  PHT measures 347 msec. Mild aortic stenosis is present. Aortic valve mean gradient measures 9.0 mmHg. Aortic valve peak gradient measures 17.8 mmHg. Aortic valve area, by VTI measures 1.38 cm. Pulmonic Valve: The pulmonic valve was normal in structure. Pulmonic valve regurgitation is not visualized. Aorta: The aortic root is normal in size and structure. There is mild (Grade II) atheroma plaque involving the aortic root and ascending aorta. Venous: The inferior vena cava is normal in size with greater than 50% respiratory variability, suggesting right atrial pressure of 3 mmHg. IAS/Shunts: No atrial level shunt detected by color flow Doppler.  LEFT VENTRICLE PLAX  2D LVIDd:         3.60 cm  Diastology LVIDs:         2.50 cm  LV e' medial:    4.62 cm/s LV PW:         1.40 cm  LV E/e' medial:  11.1 LV IVS:        1.10 cm  LV e' lateral:   5.92 cm/s LVOT diam:     1.90 cm  LV E/e' lateral: 8.7 LV SV:         65 LV SV Index:   42 LVOT Area:     2.84 cm  RIGHT VENTRICLE            IVC RV Basal diam:  2.90 cm    IVC diam: 1.50 cm RV S prime:     8.35 cm/s TAPSE (M-mode): 1.5 cm LEFT ATRIUM             Index       RIGHT  ATRIUM           Index LA diam:        2.40 cm 1.53 cm/m  RA Area:     16.00 cm LA Vol (A2C):   47.3 ml 30.23 ml/m RA Volume:   39.30 ml  25.12 ml/m LA Vol (A4C):   35.2 ml 22.50 ml/m LA Biplane Vol: 41.7 ml 26.65 ml/m  AORTIC VALVE AV Area (Vmax):    1.13 cm AV Area (Vmean):   1.15 cm AV Area (VTI):     1.38 cm AV Vmax:           211.00 cm/s AV Vmean:          140.000 cm/s AV VTI:            0.473 m AV Peak Grad:      17.8 mmHg AV Mean Grad:      9.0 mmHg LVOT Vmax:         84.20 cm/s LVOT Vmean:        57.000 cm/s LVOT VTI:          0.230 m LVOT/AV VTI ratio: 0.49 AI PHT:            347 msec  AORTA Ao Root diam: 3.20 cm Ao Asc diam:  2.80 cm MITRAL VALVE MV Area (PHT): 2.73 cm    SHUNTS MV Area VTI:   1.76 cm    Systemic VTI:  0.23 m MV Peak grad:  4.6 mmHg    Systemic Diam: 1.90 cm MV Mean grad:  2.0 mmHg MV Vmax:       1.07 m/s MV Vmean:      58.3 cm/s MV Decel Time: 278 msec MV E velocity: 51.40 cm/s MV A velocity: 96.70 cm/s MV E/A ratio:  0.53 Dixie Dials MD Electronically signed by Dixie Dials MD Signature Date/Time: 07/19/2020/12:34:07 PM    Final     Assessment & Plan:    Walker Kehr, MD

## 2021-02-05 NOTE — Patient Instructions (Addendum)
  Try TENS unit  Try hand massager

## 2021-03-11 ENCOUNTER — Telehealth: Payer: Self-pay

## 2021-03-11 NOTE — Telephone Encounter (Signed)
Please advise as the pt has stated she is on furosemide (LASIX) 20 MG tablet and is asking that she be put on Potassium as her provider normally rx this when she is on the fluid pills.

## 2021-03-12 MED ORDER — POTASSIUM CHLORIDE CRYS ER 10 MEQ PO TBCR: 10.0000 meq | EXTENDED_RELEASE_TABLET | Freq: Every day | ORAL | 3 refills | Status: DC

## 2021-03-12 NOTE — Telephone Encounter (Signed)
Notified pt MD sent rx to [facility] 

## 2021-03-12 NOTE — Telephone Encounter (Signed)
Okay.  Done.  Thanks 

## 2021-04-02 ENCOUNTER — Other Ambulatory Visit: Payer: Self-pay | Admitting: Internal Medicine

## 2021-04-08 DIAGNOSIS — M150 Primary generalized (osteo)arthritis: Secondary | ICD-10-CM | POA: Diagnosis not present

## 2021-04-08 DIAGNOSIS — M0609 Rheumatoid arthritis without rheumatoid factor, multiple sites: Secondary | ICD-10-CM | POA: Diagnosis not present

## 2021-04-08 DIAGNOSIS — M545 Low back pain, unspecified: Secondary | ICD-10-CM | POA: Diagnosis not present

## 2021-04-08 DIAGNOSIS — Z79899 Other long term (current) drug therapy: Secondary | ICD-10-CM | POA: Diagnosis not present

## 2021-04-08 DIAGNOSIS — Z13220 Encounter for screening for lipoid disorders: Secondary | ICD-10-CM | POA: Diagnosis not present

## 2021-04-08 DIAGNOSIS — R600 Localized edema: Secondary | ICD-10-CM | POA: Diagnosis not present

## 2021-04-08 DIAGNOSIS — Z6828 Body mass index (BMI) 28.0-28.9, adult: Secondary | ICD-10-CM | POA: Diagnosis not present

## 2021-04-08 DIAGNOSIS — E663 Overweight: Secondary | ICD-10-CM | POA: Diagnosis not present

## 2021-05-13 DIAGNOSIS — M5451 Vertebrogenic low back pain: Secondary | ICD-10-CM | POA: Diagnosis not present

## 2021-05-13 DIAGNOSIS — M542 Cervicalgia: Secondary | ICD-10-CM | POA: Diagnosis not present

## 2021-05-13 NOTE — Progress Notes (Deleted)
Subjective:    Patient ID: Debra Farmer, female    DOB: [birth year], 75 y.o.   MRN: [id number]  This visit occurred during the SARS-CoV-2 public health emergency.  Safety protocols were in place, including screening questions prior to the visit, additional usage of staff PPE, and extensive cleaning of exam room while observing appropriate contact time as indicated for disinfecting solutions.    HPI The patient is here for an acute visit.   Lower back pain since 11/3 -     Medications and allergies reviewed with patient and updated if appropriate.  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[place]) 03/20/2020   Preop exam for internal medicine 03/20/2020   Bilateral hand numbness 01/24/2020   Coronary atherosclerosis 09/22/2017   Chest pain, atypical 09/22/2017   Primary osteoarthritis of right hip 04/01/2017   Osteoarthritis of right hip 04/01/2017   Osteoporosis 03/03/2017   Decreased libido 10/19/2016   Spinal stenosis at L4-L5 level 08/26/2016   Hip osteoarthritis 04/28/2016   Neuropathy of right peroneal nerve 02/10/2016   Diarrhea 09/23/2015   Bell's palsy 03/02/2015   Seronegative rheumatoid arthritis ([place]) 03/02/2015   Neck pain 04/26/2014   Herpes zoster 04/05/2014   Thrush, oral 03/06/2014   Left arm swelling 01/15/2013   Cough 09/19/2012   Insomnia 09/01/2012   CTS (carpal tunnel syndrome) 04/15/2012   Arthritis of multiple sites 03/04/2012   Well adult exam 01/27/2012   Knee pain, bilateral 01/08/2012   Hip pain, right 12/26/2010   DISCITIS 07/25/2010   SWEATING 05/23/2010   PARESTHESIA, HANDS 04/25/2010   COPD mixed type ([place]) 07/26/2009   SINUSITIS, ACUTE  08/01/2008   RASH AND OTHER NONSPECIFIC SKIN ERUPTION 08/01/2008   TOBACCO USE DISORDER/SMOKER-SMOKING CESSATION DISCUSSED 03/08/2008   BRONCHITIS, ACUTE 03/08/2008   Dyslipidemia 02/27/2008   CRAMP OF LIMB 02/27/2008   FATIGUE 02/27/2008   Wheezing 02/27/2008   MENINGITIS 08/11/2007   GERD 08/11/2007   HEADACHE 08/11/2007   OSTEOARTHRITIS 01/11/2007    Current Outpatient Medications on File Prior to Visit  Medication Sig Dispense Refill   Antarctic Krill Oil 500 MG CAPS Take 1 capsule by mouth 2 (two) times daily.     aspirin EC 81 MG EC tablet Take 1 tablet (81 mg total) by mouth daily. Swallow whole. 30 tablet 11   BIOTIN PO Take 2 tablets by mouth daily. gummy     Cholecalciferol (VITAMIN D3) 5000 units TABS Take 5,000 Units by mouth in the morning and at bedtime. WITH A GLASS OF VANILLA-HONEY ALMOND MILK     Coenzyme Q10 (COQ10) 100 MG CAPS Take 1 capsule by mouth in the morning, at noon, in the evening, and at bedtime.     Cyanocobalamin (VITAMIN B-12) 5000 MCG TBDP Take 5,000 mcg by mouth daily.     ezetimibe (ZETIA) 10 MG tablet Take 1 tablet (10 mg total) by mouth daily. 30 tablet 3   furosemide (LASIX) 20 MG tablet Take 1-2 tablets (20-40 mg total) by mouth daily as needed for edema. 60 tablet 5   leflunomide (ARAVA) 20 MG tablet Take 20 mg by mouth daily.     Melatonin 3 MG TABS Take 3 mg by mouth at bedtime  as needed (for sleep).     Multiple Vitamins-Minerals (MULTIVITAMIN WITH MINERALS) tablet Take 1 tablet by mouth daily. Immune     nitroGLYCERIN (NITROSTAT) 0.4 MG SL tablet Place 1 tablet (0.4 mg total) under the tongue every 5 (five) minutes x 3 doses as needed for chest pain. 25 tablet 1   ondansetron (ZOFRAN) 4 MG tablet Take 1 tablet (4 mg total) by mouth every 8 (eight) hours as needed for nausea or vomiting. 20 tablet 0   potassium chloride (KLOR-CON) 10 MEQ tablet Take 1 tablet (10 mEq total) by mouth daily. 90 tablet 3   Red Yeast Rice 600 MG TABS Take 1 tablet  by mouth 2 (two) times daily.     Sarilumab (KEVZARA) 200 MG/1.14ML SOAJ Inject 1 Dose into the skin See admin instructions. Every 2 weeks     ticagrelor (BRILINTA) 90 MG TABS tablet Take 1 tablet (90 mg total) by mouth 2 (two) times daily. 60 tablet 3   traMADol (ULTRAM) 50 MG tablet Take 1 tablet (50 mg total) by mouth every 6 (six) hours as needed. 120 tablet 3   zolpidem (AMBIEN) 10 MG tablet TAKE 1 TABLET BY MOUTH AT BEDTIME AS NEEDED FOR SLEEP 90 tablet 1   No current facility-administered medications on file prior to visit.    Past Medical History:  Diagnosis Date   Arthritis    RA   COPD (chronic obstructive pulmonary disease) (HCC)    GERD (gastroesophageal reflux disease)    Neuromuscular disorder ([place]) 02/05/2015   Bells Palsy, right    Past Surgical History:  Procedure Laterality Date   ABDOMINAL HYSTERECTOMY     partial   ANTERIOR CERVICAL DECOMPRESSION/DISCECTOMY FUSION 4 LEVELS N/A 04/03/2020   Procedure: ANTERIOR CERVICAL DECOMPRESSION/DISCECTOMY FUSION C3-7;  Surgeon: Melina Schools, MD;  Location: [facility];  Service: Orthopedics;  Laterality: N/A;  5 hrs   APPENDECTOMY     CATARACT EXTRACTION Bilateral    about 3 years ago    CHOLECYSTECTOMY     CORONARY STENT INTERVENTION N/A 07/19/2020   Procedure: CORONARY STENT INTERVENTION;  Surgeon: Belva Crome, MD;  Location: [facility];  Service: Cardiovascular;  Laterality: N/A;   KNEE ARTHROSCOPY  1999   Dr Theda Sers -- R knee   LEFT HEART CATH AND CORONARY ANGIOGRAPHY N/A 07/19/2020   Procedure: LEFT HEART CATH AND CORONARY ANGIOGRAPHY;  Surgeon: Dixie Dials, MD;  Location: [facility];  Service: Cardiovascular;  Laterality: N/A;   LUMBAR LAMINECTOMY/DECOMPRESSION MICRODISCECTOMY Right 08/26/2016   Procedure: Microlumbar decompression L4-5, L5-S1 on Right;  Surgeon: Susa Day, MD;  Location: [facility];  Service: Orthopedics;  Laterality: Right;   POSTERIOR CERVICAL FUSION/FORAMINOTOMY N/A 04/04/2020    Procedure: POSTERIOR SPINAL FUSION INSTRUMENTATION CERVICAL THREE THROUGH SEVEN, CERVICAL THREE LAMINOTOMY;  Surgeon: Melina Schools, MD;  Location: [facility];  Service: Orthopedics;  Laterality: N/A;  5 hrs   TOTAL HIP ARTHROPLASTY Right 04/01/2017   Procedure: RIGHT TOTAL HIP ARTHROPLASTY ANTERIOR APPROACH;  Surgeon: Rod Can, MD;  Location: [facility];  Service: Orthopedics;  Laterality: Right;  Needs RNFA    Social History   Socioeconomic History   Marital status: Married    Spouse name: Not on file   Number of children: Not on file   Years of education: Not on file   Highest education level: Not on file  Occupational History   Not on file  Tobacco Use   Smoking status: Former    Packs/day: 0.50    Years:  58.00    Pack years: 29.00    Types: Cigarettes    Quit date: 02/15/2018    Years since quitting: 3.2   Smokeless tobacco: Never  Vaping Use   Vaping Use: Never used  Substance and Sexual Activity   Alcohol use: No   Drug use: No   Sexual activity: Yes  Other Topics Concern   Not on file  Social History Narrative   Not on file   Social Determinants of Health   Financial Resource Strain: Not on file  Food Insecurity: Not on file  Transportation Needs: Not on file  Physical Activity: Not on file  Stress: Not on file  Social Connections: Not on file    Family History  Problem Relation Age of Onset   Heart disease Mother 30       CHF   COPD Father 70       emphesema    Review of Systems     Objective:  There were no vitals filed for this visit. BP Readings from Last 3 Encounters:  02/05/21 122/68  11/04/20 (!) 142/70  08/05/20 130/70   Wt Readings from Last 3 Encounters:  02/05/21 141 lb 9.6 oz (64.2 kg)  11/04/20 132 lb 13.6 oz (60.3 kg)  08/05/20 134 lb (60.8 kg)   There is no height or weight on file to calculate BMI.   Physical Exam         Assessment & Plan:    See Problem List for Assessment and Plan of chronic medical problems.

## 2021-05-14 ENCOUNTER — Ambulatory Visit: Payer: PPO | Admitting: Internal Medicine

## 2021-06-04 DIAGNOSIS — Z6827 Body mass index (BMI) 27.0-27.9, adult: Secondary | ICD-10-CM | POA: Diagnosis not present

## 2021-06-04 DIAGNOSIS — M150 Primary generalized (osteo)arthritis: Secondary | ICD-10-CM | POA: Diagnosis not present

## 2021-06-04 DIAGNOSIS — Z79899 Other long term (current) drug therapy: Secondary | ICD-10-CM | POA: Diagnosis not present

## 2021-06-04 DIAGNOSIS — R5383 Other fatigue: Secondary | ICD-10-CM | POA: Diagnosis not present

## 2021-06-04 DIAGNOSIS — M2550 Pain in unspecified joint: Secondary | ICD-10-CM | POA: Diagnosis not present

## 2021-06-04 DIAGNOSIS — E663 Overweight: Secondary | ICD-10-CM | POA: Diagnosis not present

## 2021-06-04 DIAGNOSIS — M0609 Rheumatoid arthritis without rheumatoid factor, multiple sites: Secondary | ICD-10-CM | POA: Diagnosis not present

## 2021-06-04 DIAGNOSIS — R768 Other specified abnormal immunological findings in serum: Secondary | ICD-10-CM | POA: Diagnosis not present

## 2021-06-18 ENCOUNTER — Other Ambulatory Visit: Payer: Self-pay

## 2021-06-18 ENCOUNTER — Ambulatory Visit (INDEPENDENT_AMBULATORY_CARE_PROVIDER_SITE_OTHER): Payer: PPO

## 2021-06-18 DIAGNOSIS — Z23 Encounter for immunization: Secondary | ICD-10-CM | POA: Diagnosis not present

## 2021-06-18 NOTE — Progress Notes (Addendum)
Pt given HD flu vacc w/o any complications.  Medical screening examination/treatment/procedure(s) were performed by non-physician practitioner and as supervising physician I was immediately available for consultation/collaboration.  I agree with above. Lew Dawes, MD

## 2021-06-23 DIAGNOSIS — E7800 Pure hypercholesterolemia, unspecified: Secondary | ICD-10-CM | POA: Insufficient documentation

## 2021-06-23 DIAGNOSIS — Z6827 Body mass index (BMI) 27.0-27.9, adult: Secondary | ICD-10-CM | POA: Diagnosis not present

## 2021-06-23 DIAGNOSIS — Z124 Encounter for screening for malignant neoplasm of cervix: Secondary | ICD-10-CM | POA: Diagnosis not present

## 2021-06-23 DIAGNOSIS — R159 Full incontinence of feces: Secondary | ICD-10-CM | POA: Diagnosis not present

## 2021-06-23 DIAGNOSIS — Z1272 Encounter for screening for malignant neoplasm of vagina: Secondary | ICD-10-CM | POA: Diagnosis not present

## 2021-06-23 DIAGNOSIS — N819 Female genital prolapse, unspecified: Secondary | ICD-10-CM | POA: Diagnosis not present

## 2021-06-23 DIAGNOSIS — N952 Postmenopausal atrophic vaginitis: Secondary | ICD-10-CM | POA: Diagnosis not present

## 2021-06-23 DIAGNOSIS — R32 Unspecified urinary incontinence: Secondary | ICD-10-CM | POA: Diagnosis not present

## 2021-07-08 ENCOUNTER — Other Ambulatory Visit: Payer: Self-pay | Admitting: Internal Medicine

## 2021-09-02 DIAGNOSIS — M5416 Radiculopathy, lumbar region: Secondary | ICD-10-CM | POA: Diagnosis not present

## 2021-09-04 DIAGNOSIS — R5383 Other fatigue: Secondary | ICD-10-CM | POA: Diagnosis not present

## 2021-09-04 DIAGNOSIS — E663 Overweight: Secondary | ICD-10-CM | POA: Diagnosis not present

## 2021-09-04 DIAGNOSIS — M1991 Primary osteoarthritis, unspecified site: Secondary | ICD-10-CM | POA: Diagnosis not present

## 2021-09-04 DIAGNOSIS — R768 Other specified abnormal immunological findings in serum: Secondary | ICD-10-CM | POA: Diagnosis not present

## 2021-09-04 DIAGNOSIS — Z79899 Other long term (current) drug therapy: Secondary | ICD-10-CM | POA: Diagnosis not present

## 2021-09-04 DIAGNOSIS — M7989 Other specified soft tissue disorders: Secondary | ICD-10-CM | POA: Diagnosis not present

## 2021-09-04 DIAGNOSIS — Z6827 Body mass index (BMI) 27.0-27.9, adult: Secondary | ICD-10-CM | POA: Diagnosis not present

## 2021-09-04 DIAGNOSIS — M0609 Rheumatoid arthritis without rheumatoid factor, multiple sites: Secondary | ICD-10-CM | POA: Diagnosis not present

## 2021-09-09 DIAGNOSIS — M5416 Radiculopathy, lumbar region: Secondary | ICD-10-CM | POA: Diagnosis not present

## 2021-09-23 DIAGNOSIS — M5136 Other intervertebral disc degeneration, lumbar region: Secondary | ICD-10-CM | POA: Diagnosis not present

## 2021-10-02 ENCOUNTER — Other Ambulatory Visit: Payer: Self-pay | Admitting: Internal Medicine

## 2021-10-03 NOTE — Telephone Encounter (Signed)
Check [employer] registry last filled 09/05/2021.Marland KitchenJohny Chess ? ?

## 2021-11-04 ENCOUNTER — Other Ambulatory Visit: Payer: Self-pay | Admitting: Internal Medicine

## 2021-11-11 ENCOUNTER — Encounter: Payer: Self-pay | Admitting: Internal Medicine

## 2021-11-11 ENCOUNTER — Ambulatory Visit (INDEPENDENT_AMBULATORY_CARE_PROVIDER_SITE_OTHER): Payer: PPO | Admitting: Internal Medicine

## 2021-11-11 DIAGNOSIS — K5901 Slow transit constipation: Secondary | ICD-10-CM

## 2021-11-11 DIAGNOSIS — M660 Rupture of popliteal cyst: Secondary | ICD-10-CM | POA: Insufficient documentation

## 2021-11-11 DIAGNOSIS — Z79899 Other long term (current) drug therapy: Secondary | ICD-10-CM | POA: Insufficient documentation

## 2021-11-11 DIAGNOSIS — M0600 Rheumatoid arthritis without rheumatoid factor, unspecified site: Secondary | ICD-10-CM | POA: Diagnosis not present

## 2021-11-11 DIAGNOSIS — M159 Polyosteoarthritis, unspecified: Secondary | ICD-10-CM | POA: Insufficient documentation

## 2021-11-11 DIAGNOSIS — M130 Polyarthritis, unspecified: Secondary | ICD-10-CM

## 2021-11-11 DIAGNOSIS — K5900 Constipation, unspecified: Secondary | ICD-10-CM | POA: Insufficient documentation

## 2021-11-11 DIAGNOSIS — M48061 Spinal stenosis, lumbar region without neurogenic claudication: Secondary | ICD-10-CM | POA: Diagnosis not present

## 2021-11-11 DIAGNOSIS — M2550 Pain in unspecified joint: Secondary | ICD-10-CM | POA: Insufficient documentation

## 2021-11-11 DIAGNOSIS — R609 Edema, unspecified: Secondary | ICD-10-CM

## 2021-11-11 DIAGNOSIS — M5430 Sciatica, unspecified side: Secondary | ICD-10-CM | POA: Insufficient documentation

## 2021-11-11 DIAGNOSIS — R768 Other specified abnormal immunological findings in serum: Secondary | ICD-10-CM | POA: Insufficient documentation

## 2021-11-11 MED ORDER — LINACLOTIDE 290 MCG PO CAPS: 290.0000 ug | ORAL_CAPSULE | Freq: Every day | ORAL | 11 refills | Status: DC

## 2021-11-11 NOTE — Assessment & Plan Note (Signed)
Worse - on Lasix

## 2021-11-11 NOTE — Patient Instructions (Signed)
Blue-Emu cream -- use 2-3 times a day ? ?

## 2021-11-11 NOTE — Assessment & Plan Note (Signed)
Worse Will try Linzess

## 2021-11-11 NOTE — Progress Notes (Signed)
Subjective:  Patient ID: Debra Farmer, female    DOB: Aug 01, 1945  Age: 76 y.o. MRN: [id number]  CC: No chief complaint on file.   HPI Debra Farmer presents for edema on Embrel x 7 wks (new). Debra Farmer  was d/c'd C/o constipation, gas  Outpatient Medications Prior to Visit  Medication Sig Dispense Refill   Antarctic Krill Oil 500 MG CAPS Take 1 capsule by mouth 2 (two) times daily.     aspirin EC 81 MG EC tablet Take 1 tablet (81 mg total) by mouth daily. Swallow whole. 30 tablet 11   BIOTIN PO Take 2 tablets by mouth daily. gummy     Cholecalciferol (VITAMIN D3) 5000 units TABS Take 5,000 Units by mouth in the morning and at bedtime. WITH A GLASS OF VANILLA-HONEY ALMOND MILK     Coenzyme Q10 (COQ10) 100 MG CAPS Take 1 capsule by mouth in the morning, at noon, in the evening, and at bedtime.     Cyanocobalamin (VITAMIN B-12) 5000 MCG TBDP Take 5,000 mcg by mouth daily.     ezetimibe (ZETIA) 10 MG tablet Take 1 tablet (10 mg total) by mouth daily. 30 tablet 3   furosemide (LASIX) 20 MG tablet Take 1-2 tablets (20-40 mg total) by mouth daily as needed for edema. 60 tablet 5   leflunomide (ARAVA) 20 MG tablet Take 20 mg by mouth daily.     Melatonin 3 MG TABS Take 3 mg by mouth at bedtime as needed (for sleep).     Multiple Vitamins-Minerals (MULTIVITAMIN WITH MINERALS) tablet Take 1 tablet by mouth daily. Immune     nitroGLYCERIN (NITROSTAT) 0.4 MG SL tablet Place 1 tablet (0.4 mg total) under the tongue every 5 (five) minutes x 3 doses as needed for chest pain. 25 tablet 1   ondansetron (ZOFRAN) 4 MG tablet Take 1 tablet (4 mg total) by mouth every 8 (eight) hours as needed for nausea or vomiting. 20 tablet 0   potassium chloride (KLOR-CON) 10 MEQ tablet Take 1 tablet (10 mEq total) by mouth daily. 90 tablet 3   Red Yeast Rice 600 MG TABS Take 1 tablet by mouth 2 (two) times daily.     ticagrelor (BRILINTA) 90 MG TABS tablet Take 1 tablet (90 mg total) by mouth 2 (two) times  daily. 60 tablet 3   traMADol (ULTRAM) 50 MG tablet TAKE 1 TABLET BY MOUTH EVERY 6 HOURS 120 tablet 3   zolpidem (AMBIEN) 10 MG tablet TAKE 1 TABLET BY MOUTH EACH NIGHT AT BEDTIME FOR SLEEP 90 tablet 0   Sarilumab (KEVZARA) 200 MG/1.14ML SOAJ Inject 1 Dose into the skin See admin instructions. Every 2 weeks     No facility-administered medications prior to visit.    ROS: Review of Systems  Constitutional:  Negative for activity change, appetite change, chills, fatigue and unexpected weight change.  HENT:  Negative for congestion, mouth sores and sinus pressure.   Eyes:  Negative for visual disturbance.  Respiratory:  Negative for cough and chest tightness.   Cardiovascular:  Positive for leg swelling.  Gastrointestinal:  Positive for abdominal distention and constipation. Negative for abdominal pain and nausea.  Genitourinary:  Negative for difficulty urinating, frequency and vaginal pain.  Musculoskeletal:  Positive for arthralgias, back pain, gait problem and joint swelling.  Skin:  Negative for pallor and rash.  Neurological:  Negative for dizziness, tremors, weakness, numbness and headaches.  Psychiatric/Behavioral:  Negative for confusion and sleep disturbance.    Objective:  BP 122/68 (  BP Location: Left Arm, Patient Position: Sitting, Cuff Size: Large)   Pulse 73   Temp 98.8 F (37.1 C) (Oral)   Ht '5\' 2"'$  (1.575 m)   Wt 141 lb (64 kg)   SpO2 94%   BMI 25.79 kg/m   BP Readings from Last 3 Encounters:  11/11/21 122/68  02/05/21 122/68  11/04/20 (!) 142/70    Wt Readings from Last 3 Encounters:  11/11/21 141 lb (64 kg)  02/05/21 141 lb 9.6 oz (64.2 kg)  11/04/20 132 lb 13.6 oz (60.3 kg)    Physical Exam Constitutional:      General: She is not in acute distress.    Appearance: Normal appearance. She is well-developed.  HENT:     Head: Normocephalic.     Right Ear: External ear normal.     Left Ear: External ear normal.     Nose: Nose normal.  Eyes:      General:        Right eye: No discharge.        Left eye: No discharge.     Conjunctiva/sclera: Conjunctivae normal.     Pupils: Pupils are equal, round, and reactive to light.  Neck:     Thyroid: No thyromegaly.     Vascular: No JVD.     Trachea: No tracheal deviation.  Cardiovascular:     Rate and Rhythm: Normal rate and regular rhythm.     Heart sounds: Normal heart sounds.  Pulmonary:     Effort: No respiratory distress.     Breath sounds: No stridor. No wheezing.  Abdominal:     General: Bowel sounds are normal. There is no distension.     Palpations: Abdomen is soft. There is no mass.     Tenderness: There is no abdominal tenderness. There is no guarding or rebound.  Musculoskeletal:        General: Tenderness present.     Cervical back: Normal range of motion and neck supple. No rigidity.     Right lower leg: Edema present.     Left lower leg: Edema present.  Lymphadenopathy:     Cervical: No cervical adenopathy.  Skin:    Findings: No erythema or rash.  Neurological:     Cranial Nerves: No cranial nerve deficit.     Motor: No abnormal muscle tone.     Coordination: Coordination normal.     Deep Tendon Reflexes: Reflexes normal.  Psychiatric:        Behavior: Behavior normal.        Thought Content: Thought content normal.        Judgment: Judgment normal.  Trace edema  Lab Results  Component Value Date   WBC 3.9 (L) 07/21/2020   HGB 11.6 (L) 07/21/2020   HCT 36.0 07/21/2020   PLT 171 07/21/2020   GLUCOSE 115 (H) 07/20/2020   CHOL 275 (H) 07/20/2020   TRIG 321 (H) 07/20/2020   HDL 40 (L) 07/20/2020   LDLDIRECT 170.0 12/05/2014   LDLCALC 171 (H) 07/20/2020   ALT 31 07/19/2020   AST 26 07/19/2020   NA 137 07/20/2020   K 3.9 07/20/2020   CL 105 07/20/2020   CREATININE 0.58 07/20/2020   BUN 6 (L) 07/20/2020   CO2 23 07/20/2020   TSH 1.83 01/23/2020   INR 0.9 04/01/2020   HGBA1C 5.9 (H) 07/20/2020    CARDIAC CATHETERIZATION  Result Date: 07/19/2020   1st Diag lesion is 70% stenosed.  2nd Diag lesion is 75% stenosed.  Mid  Cx lesion is 90% stenosed.  Prox RCA to Mid RCA lesion is 85% stenosed.  Dr. Tamala Julian placed 3.5 x 30 Onyx drug eluting stent in RCA, reduced 85 % lesion to 0 % with TIMI grade III flow and 2.5 x 12 mm Onyx stent in LCx, reduced 90 % lesion to 0 % with TIMI grade 3 flow.   CARDIAC CATHETERIZATION  Result Date: 07/19/2020  A stent was successfully placed.   Complicated procedure due to unfamiliarity with the patient's clinical history, ongoing severe chest discomfort, and technical issues associated with the left circumflex intervention.  85% mid right coronary with TIMI III flow was reduced to 10% with TIMI grade III flow using a 30 x 3.5 Onyx deployed at high pressure.  90% mid circumflex stenosis with TIMI grade II-III flow treated with a 2.5 x 12 mm Onyx reducing stenosis to less than 10% with TIMI grade III flow. RECOMMENDATIONS:  78-monthduration of dual antiplatelet therapy.  Aspirin and Brilinta for now.  Can switch to clopidogrel and drop Brilinta after 30 days.  Management, general orders, and clinical follow-up per Dr. ADixie Dials   DG Chest Port 1 View  Result Date: 07/19/2020 CLINICAL DATA:  Chest pain which started a few hours ago EXAM: PORTABLE CHEST 1 VIEW COMPARISON:  Radiograph 04/01/2020, CT 09/21/2017 FINDINGS: Slightly diminished lung volumes with some streaky opacities in the lung bases, increased from comparison. No focal consolidation. No pneumothorax or visible effusion. The aorta is calcified. The remaining cardiomediastinal contours are unremarkable. No acute osseous or soft tissue abnormality. Prior cervical fusion, incompletely assessed on this exam. Telemetry leads overlie the chest. IMPRESSION: Slightly diminished lung volumes with some streaky opacities in the lung bases, favor atelectasis. No other acute cardiopulmonary abnormality. Electronically Signed   By: PLovena LeM.D.   On: 07/19/2020  04:23   ECHOCARDIOGRAM COMPLETE  Result Date: 07/19/2020    ECHOCARDIOGRAM REPORT   Patient Name:   PGRICELDA FOLANDDate of Exam: 07/19/2020 Medical Rec #:  [id number]        Height:       60.0 in Accession #:    [id number]       Weight:       132.0 lb Date of Birth:  [birth year]       BSA:          1.564 m Patient Age:    89years          BP:           135/78 mmHg Patient Gender: F                 HR:           75 bpm. Exam Location:  Inpatient Procedure: 2D Echo, Cardiac Doppler and Color Doppler Indications:     CAD Native Vessel  History:         Patient has no prior history of Echocardiogram examinations.                  COPD; Risk Factors:Dyslipidemia and Former Smoker. GERD.  Sonographer:     AClayton LefortRDCS (AE) Referring Phys:  [email or web address] Phys: ADixie DialsMD IMPRESSIONS  1. Left ventricular ejection fraction, by estimation, is 55 to 60%. The left ventricle has normal function. The left ventricle has no regional wall motion abnormalities. There is mild concentric left ventricular hypertrophy. Left ventricular diastolic parameters are consistent with Grade I diastolic dysfunction (impaired  relaxation).  2. Right ventricular systolic function is normal. The right ventricular size is normal. Mildly increased right ventricular wall thickness.  3. Left atrial size was mildly dilated.  4. Right atrial size was mildly dilated.  5. The mitral valve is degenerative. Mild mitral valve regurgitation.  6. The aortic valve is tricuspid. There is moderate calcification of the aortic valve. There is moderate thickening of the aortic valve. Aortic valve regurgitation is mild. Mild aortic valve stenosis.  7. There is mild (Grade II) atheroma plaque involving the aortic root and ascending aorta.  8. The inferior vena cava is normal in size with greater than 50% respiratory variability, suggesting right atrial pressure of 3 mmHg. FINDINGS  Left Ventricle: Left ventricular ejection fraction, by  estimation, is 55 to 60%. The left ventricle has normal function. The left ventricle has no regional wall motion abnormalities. The left ventricular internal cavity size was normal in size. There is  mild concentric left ventricular hypertrophy. Left ventricular diastolic parameters are consistent with Grade I diastolic dysfunction (impaired relaxation). Right Ventricle: The right ventricular size is normal. Mildly increased right ventricular wall thickness. Right ventricular systolic function is normal. Left Atrium: Left atrial size was mildly dilated. Right Atrium: Right atrial size was mildly dilated. Pericardium: There is no evidence of pericardial effusion. Mitral Valve: The mitral valve is degenerative in appearance. There is mild thickening of the mitral valve leaflet(s). There is mild calcification of the mitral valve leaflet(s). Mild mitral annular calcification. Mild mitral valve regurgitation. MV peak  gradient, 4.6 mmHg. The mean mitral valve gradient is 2.0 mmHg. Tricuspid Valve: The tricuspid valve is normal in structure. Tricuspid valve regurgitation is mild. Aortic Valve: The aortic valve is tricuspid. There is moderate calcification of the aortic valve. There is moderate thickening of the aortic valve. There is mild aortic valve annular calcification. Aortic valve regurgitation is mild. Aortic regurgitation  PHT measures 347 msec. Mild aortic stenosis is present. Aortic valve mean gradient measures 9.0 mmHg. Aortic valve peak gradient measures 17.8 mmHg. Aortic valve area, by VTI measures 1.38 cm. Pulmonic Valve: The pulmonic valve was normal in structure. Pulmonic valve regurgitation is not visualized. Aorta: The aortic root is normal in size and structure. There is mild (Grade II) atheroma plaque involving the aortic root and ascending aorta. Venous: The inferior vena cava is normal in size with greater than 50% respiratory variability, suggesting right atrial pressure of 3 mmHg. IAS/Shunts: No  atrial level shunt detected by color flow Doppler.  LEFT VENTRICLE PLAX 2D LVIDd:         3.60 cm  Diastology LVIDs:         2.50 cm  LV e' medial:    4.62 cm/s LV PW:         1.40 cm  LV E/e' medial:  11.1 LV IVS:        1.10 cm  LV e' lateral:   5.92 cm/s LVOT diam:     1.90 cm  LV E/e' lateral: 8.7 LV SV:         65 LV SV Index:   42 LVOT Area:     2.84 cm  RIGHT VENTRICLE            IVC RV Basal diam:  2.90 cm    IVC diam: 1.50 cm RV S prime:     8.35 cm/s TAPSE (M-mode): 1.5 cm LEFT ATRIUM             Index  RIGHT ATRIUM           Index LA diam:        2.40 cm 1.53 cm/m  RA Area:     16.00 cm LA Vol (A2C):   47.3 ml 30.23 ml/m RA Volume:   39.30 ml  25.12 ml/m LA Vol (A4C):   35.2 ml 22.50 ml/m LA Biplane Vol: 41.7 ml 26.65 ml/m  AORTIC VALVE AV Area (Vmax):    1.13 cm AV Area (Vmean):   1.15 cm AV Area (VTI):     1.38 cm AV Vmax:           211.00 cm/s AV Vmean:          140.000 cm/s AV VTI:            0.473 m AV Peak Grad:      17.8 mmHg AV Mean Grad:      9.0 mmHg LVOT Vmax:         84.20 cm/s LVOT Vmean:        57.000 cm/s LVOT VTI:          0.230 m LVOT/AV VTI ratio: 0.49 AI PHT:            347 msec  AORTA Ao Root diam: 3.20 cm Ao Asc diam:  2.80 cm MITRAL VALVE MV Area (PHT): 2.73 cm    SHUNTS MV Area VTI:   1.76 cm    Systemic VTI:  0.23 m MV Peak grad:  4.6 mmHg    Systemic Diam: 1.90 cm MV Mean grad:  2.0 mmHg MV Vmax:       1.07 m/s MV Vmean:      58.3 cm/s MV Decel Time: 278 msec MV E velocity: 51.40 cm/s MV A velocity: 96.70 cm/s MV E/A ratio:  0.53 Dixie Dials MD Electronically signed by Dixie Dials MD Signature Date/Time: 07/19/2020/12:34:07 PM    Final     Assessment & Plan:   Problem List Items Addressed This Visit     Arthritis of multiple sites    Blue-Emu cream was recommended to use 2-3 times a day On Humira- d/c; on Embrel x 7 wks (new). Debra Farmer  was d/c'd 4/23 Tramadol prn  Prednisone - 4 mg/d      Constipation    Worse Will try Linzess       Edema     Worse - on Lasix       Seronegative rheumatoid arthritis ([place])    On Embrel x 7 wks (new). Debra Farmer  was d/c'd      Spinal stenosis at L4-L5 level    Cont on Tramadol prn  Potential benefits of a long term opioids use as well as potential risks (i.e. addiction risk, apnea etc) and complications (i.e. Somnolence, constipation and others) were explained to the patient and were aknowledged.          Meds ordered this encounter  Medications   linaclotide (LINZESS) 290 MCG CAPS capsule    Sig: Take 1 capsule (290 mcg total) by mouth daily.    Dispense:  30 capsule    Refill:  11      Follow-up: No follow-ups on file.  Walker Kehr, MD

## 2021-11-11 NOTE — Assessment & Plan Note (Signed)
On Embrel x 7 wks (new). Debra Farmer  was d/c'd

## 2021-11-11 NOTE — Assessment & Plan Note (Signed)
Blue-Emu cream was recommended to use 2-3 times a day On Humira- d/c; on Embrel x 7 wks (new). Darcus Pester  was d/c'd 4/23 Tramadol prn  Prednisone - 4 mg/d

## 2021-11-11 NOTE — Assessment & Plan Note (Signed)
Cont on Tramadol prn  Potential benefits of a long term opioids use as well as potential risks (i.e. addiction risk, apnea etc) and complications (i.e. Somnolence, constipation and others) were explained to the patient and were aknowledged.

## 2021-12-04 DIAGNOSIS — M5416 Radiculopathy, lumbar region: Secondary | ICD-10-CM | POA: Diagnosis not present

## 2021-12-11 DIAGNOSIS — M5451 Vertebrogenic low back pain: Secondary | ICD-10-CM | POA: Diagnosis not present

## 2021-12-31 DIAGNOSIS — M1991 Primary osteoarthritis, unspecified site: Secondary | ICD-10-CM | POA: Diagnosis not present

## 2021-12-31 DIAGNOSIS — M7989 Other specified soft tissue disorders: Secondary | ICD-10-CM | POA: Diagnosis not present

## 2021-12-31 DIAGNOSIS — R5383 Other fatigue: Secondary | ICD-10-CM | POA: Diagnosis not present

## 2021-12-31 DIAGNOSIS — M0609 Rheumatoid arthritis without rheumatoid factor, multiple sites: Secondary | ICD-10-CM | POA: Diagnosis not present

## 2021-12-31 DIAGNOSIS — R768 Other specified abnormal immunological findings in serum: Secondary | ICD-10-CM | POA: Diagnosis not present

## 2021-12-31 DIAGNOSIS — Z79899 Other long term (current) drug therapy: Secondary | ICD-10-CM | POA: Diagnosis not present

## 2021-12-31 DIAGNOSIS — E663 Overweight: Secondary | ICD-10-CM | POA: Diagnosis not present

## 2021-12-31 DIAGNOSIS — Z6828 Body mass index (BMI) 28.0-28.9, adult: Secondary | ICD-10-CM | POA: Diagnosis not present

## 2022-01-06 ENCOUNTER — Telehealth: Payer: Self-pay | Admitting: Internal Medicine

## 2022-01-06 ENCOUNTER — Other Ambulatory Visit: Payer: Self-pay | Admitting: Internal Medicine

## 2022-01-06 NOTE — Telephone Encounter (Signed)
Check [place] registry last filled 10/06/2021. MD is out of the office pls advise on refill...Debra Farmer

## 2022-01-06 NOTE — Telephone Encounter (Signed)
Left message for patient to call back to schedule Medicare Annual Wellness Visit   Last AWV  03/17/18  Please schedule at anytime with [facility] if patient calls the office back.     Any questions, please call me at [phone]

## 2022-02-10 DIAGNOSIS — M48062 Spinal stenosis, lumbar region with neurogenic claudication: Secondary | ICD-10-CM | POA: Diagnosis not present

## 2022-02-10 DIAGNOSIS — M5416 Radiculopathy, lumbar region: Secondary | ICD-10-CM | POA: Diagnosis not present

## 2022-02-26 ENCOUNTER — Other Ambulatory Visit: Payer: Self-pay | Admitting: Internal Medicine

## 2022-03-04 ENCOUNTER — Other Ambulatory Visit: Payer: Self-pay | Admitting: Internal Medicine

## 2022-03-05 DIAGNOSIS — M5416 Radiculopathy, lumbar region: Secondary | ICD-10-CM | POA: Diagnosis not present

## 2022-03-05 DIAGNOSIS — Z6826 Body mass index (BMI) 26.0-26.9, adult: Secondary | ICD-10-CM | POA: Diagnosis not present

## 2022-03-06 ENCOUNTER — Other Ambulatory Visit: Payer: Self-pay | Admitting: Neurological Surgery

## 2022-03-06 DIAGNOSIS — M5416 Radiculopathy, lumbar region: Secondary | ICD-10-CM

## 2022-03-18 ENCOUNTER — Ambulatory Visit (INDEPENDENT_AMBULATORY_CARE_PROVIDER_SITE_OTHER): Payer: PPO

## 2022-03-18 DIAGNOSIS — Z23 Encounter for immunization: Secondary | ICD-10-CM | POA: Diagnosis not present

## 2022-03-18 NOTE — Progress Notes (Signed)
After obtaining consent, and per orders of Dr. Alain Marion, injection of High Dose FLu given in the right deltoid  by Marrian Salvage. Patient tolerated well and instructed  to report any adverse reaction to me immediately.

## 2022-03-19 ENCOUNTER — Ambulatory Visit
Admission: RE | Admit: 2022-03-19 | Discharge: 2022-03-19 | Disposition: A | Discharge status: Home / Self Care | Payer: PPO | Source: Ambulatory Visit | Attending: Neurological Surgery | Admitting: Neurological Surgery

## 2022-03-19 DIAGNOSIS — M2578 Osteophyte, vertebrae: Secondary | ICD-10-CM | POA: Diagnosis not present

## 2022-03-19 DIAGNOSIS — M5416 Radiculopathy, lumbar region: Secondary | ICD-10-CM

## 2022-03-19 DIAGNOSIS — M5126 Other intervertebral disc displacement, lumbar region: Secondary | ICD-10-CM | POA: Diagnosis not present

## 2022-03-19 DIAGNOSIS — M48061 Spinal stenosis, lumbar region without neurogenic claudication: Secondary | ICD-10-CM | POA: Diagnosis not present

## 2022-03-19 MED ORDER — IOPAMIDOL (ISOVUE-M 200) INJECTION 41%
20.0000 mL | Freq: Once | INTRAMUSCULAR | Status: AC
  Administered 2022-03-19: 20 mL via INTRATHECAL

## 2022-03-19 MED ORDER — DIAZEPAM 5 MG PO TABS
5.0000 mg | ORAL_TABLET | Freq: Once | ORAL | Status: AC
  Administered 2022-03-19: 5 mg via ORAL

## 2022-03-19 MED ORDER — ONDANSETRON HCL 4 MG/2ML IJ SOLN
4.0000 mg | Freq: Once | INTRAMUSCULAR | Status: AC | PRN
  Administered 2022-03-19: 4 mg via INTRAMUSCULAR

## 2022-03-19 MED ORDER — MEPERIDINE HCL 50 MG/ML IJ SOLN
50.0000 mg | Freq: Once | INTRAMUSCULAR | Status: AC | PRN
  Administered 2022-03-19: 50 mg via INTRAMUSCULAR

## 2022-03-19 NOTE — Discharge Instr - Other Info (Addendum)
1000: pt reports pain 9/10 from myelogram procedure. See MAR 1020: pt reports relief in pain. Ranking pain 0/10. Pt resting in nursing recovery area until d/c.

## 2022-03-19 NOTE — Discharge Instructions (Signed)

## 2022-04-02 DIAGNOSIS — M5416 Radiculopathy, lumbar region: Secondary | ICD-10-CM | POA: Diagnosis not present

## 2022-04-02 DIAGNOSIS — Z6827 Body mass index (BMI) 27.0-27.9, adult: Secondary | ICD-10-CM | POA: Diagnosis not present

## 2022-04-14 ENCOUNTER — Other Ambulatory Visit: Payer: Self-pay | Admitting: Internal Medicine

## 2022-04-14 DIAGNOSIS — E663 Overweight: Secondary | ICD-10-CM | POA: Diagnosis not present

## 2022-04-14 DIAGNOSIS — M0609 Rheumatoid arthritis without rheumatoid factor, multiple sites: Secondary | ICD-10-CM | POA: Diagnosis not present

## 2022-04-14 DIAGNOSIS — M545 Low back pain, unspecified: Secondary | ICD-10-CM | POA: Diagnosis not present

## 2022-04-14 DIAGNOSIS — Z79899 Other long term (current) drug therapy: Secondary | ICD-10-CM | POA: Diagnosis not present

## 2022-04-14 DIAGNOSIS — Z6827 Body mass index (BMI) 27.0-27.9, adult: Secondary | ICD-10-CM | POA: Diagnosis not present

## 2022-04-14 DIAGNOSIS — M1991 Primary osteoarthritis, unspecified site: Secondary | ICD-10-CM | POA: Diagnosis not present

## 2022-04-16 DIAGNOSIS — Z780 Asymptomatic menopausal state: Secondary | ICD-10-CM | POA: Diagnosis not present

## 2022-04-16 DIAGNOSIS — M8589 Other specified disorders of bone density and structure, multiple sites: Secondary | ICD-10-CM | POA: Diagnosis not present

## 2022-04-17 ENCOUNTER — Other Ambulatory Visit: Payer: Self-pay | Admitting: Internal Medicine

## 2022-04-17 ENCOUNTER — Telehealth: Payer: Self-pay | Admitting: Internal Medicine

## 2022-04-17 MED ORDER — ZOLPIDEM TARTRATE 10 MG PO TABS: ORAL_TABLET | ORAL | 1 refills | Status: DC

## 2022-04-17 NOTE — Telephone Encounter (Signed)
Called pt back inform zolpidem wasn't denied.. request had went to another provider and she routed rx to [facility]. Inform MD is not in the office today but the request has been routed for refill.Marland KitchenJohny Farmer

## 2022-04-17 NOTE — Telephone Encounter (Signed)
PT calls back today in regards to this RX. Pharmacy had stated that Dr.Plotnikov had denied the refill of the RX. PT was wanting more clarification on this when possible.

## 2022-04-17 NOTE — Telephone Encounter (Signed)
Caller & Relationship to patient: PT  Call back number: [phone]  Date of last office visit: 11/11/2021  Date of next office visit: N/A  Medication(s) to be refilled:  zolpidem (AMBIEN) 10 MG tablet       Preferred Pharmacy:    [place], [place] might be working on an e-script for this but wanted to make sure it got back to you all.

## 2022-04-17 NOTE — Telephone Encounter (Signed)
I reordered.  Thanks

## 2022-04-21 DIAGNOSIS — Z9861 Coronary angioplasty status: Secondary | ICD-10-CM | POA: Diagnosis not present

## 2022-04-21 DIAGNOSIS — I2510 Atherosclerotic heart disease of native coronary artery without angina pectoris: Secondary | ICD-10-CM | POA: Diagnosis not present

## 2022-04-21 DIAGNOSIS — I7389 Other specified peripheral vascular diseases: Secondary | ICD-10-CM | POA: Diagnosis not present

## 2022-04-21 DIAGNOSIS — J449 Chronic obstructive pulmonary disease, unspecified: Secondary | ICD-10-CM | POA: Diagnosis not present

## 2022-04-23 ENCOUNTER — Ambulatory Visit: Payer: PPO | Admitting: Internal Medicine

## 2022-05-12 ENCOUNTER — Other Ambulatory Visit: Payer: Self-pay | Admitting: Neurological Surgery

## 2022-05-19 ENCOUNTER — Telehealth: Payer: Self-pay | Admitting: Internal Medicine

## 2022-05-19 NOTE — Telephone Encounter (Signed)
Patient is having back surgery May 27, 2022 and has to stop her aspirin till after the surgery.

## 2022-05-20 DIAGNOSIS — M5416 Radiculopathy, lumbar region: Secondary | ICD-10-CM | POA: Diagnosis not present

## 2022-05-20 NOTE — Telephone Encounter (Signed)
Notified pt w/MD response.../lmb 

## 2022-05-20 NOTE — Telephone Encounter (Signed)
Noted.  Okay to stop/restart aspirin per surgical protocol.  Thank you

## 2022-05-21 NOTE — Pre-Procedure Instructions (Signed)
Surgical Instructions    Your procedure is scheduled on Wednesday May 27, 2022.  Report to [facility] Main Entrance "A" at 8:00 A.M., then check in with the Admitting office.  Call this number if you have problems the morning of surgery:  [phone]   If you have any questions prior to your surgery date call [phone]: Open Monday-Friday 8am-4pm If you experience any cold or flu symptoms such as cough, fever, chills, shortness of breath, etc. between now and your scheduled surgery, please notify Debra Farmer at the above number     Remember:  Do not eat or drink after midnight the night before your surgery   Take these medicines the morning of surgery with A SIP OF WATER:  predniSONE (DELTASONE)   leflunomide (ARAVA)  nitroGLYCERIN (NITROSTAT) -if needed  traMADol (ULTRAM) -if needed  As of today, STOP taking any Aspirin (unless otherwise instructed by your surgeon) Aleve, Naproxen, Ibuprofen, Motrin, Advil, Goody's, BC's, all herbal medications, fish oil, and all vitamins.  Follow your surgeon's instructions on when to stop Aspirin.  If no instructions were given by your surgeon then you will need to call the office to get those instructions.           Do not wear jewelry or makeup. Do not wear lotions, powders, perfumes or deodorant. Do not shave 48 hours prior to surgery.  Do not bring valuables to the hospital. Do not wear nail polish, gel polish, artificial nails, or any other type of covering on natural nails (fingers and toes) If you have artificial nails or gel coating that need to be removed by a nail salon, please have this removed prior to surgery. Artificial nails or gel coating may interfere with anesthesia's ability to adequately monitor your vital signs.  [facility] is not responsible for any belongings or valuables.    Do NOT Smoke (Tobacco/Vaping)  24 hours prior to your procedure  If you use a CPAP at night, you may bring your mask for your overnight stay.    Contacts, glasses, hearing aids, dentures or partials may not be worn into surgery, please bring cases for these belongings   For patients admitted to the hospital, discharge time will be determined by your treatment team.   Patients discharged the day of surgery will not be allowed to drive home, and someone needs to stay with them for 24 hours.   SURGICAL WAITING ROOM VISITATION Patients having surgery or a procedure may have no more than 2 support people in the waiting area - these visitors may rotate.   Children under the age of 70 must have an adult with them who is not the patient. If the patient needs to stay at the hospital during part of their recovery, the visitor guidelines for inpatient rooms apply. Pre-op nurse will coordinate an appropriate time for 1 support person to accompany patient in pre-op.  This support person may not rotate.   Please refer to [email or web address] for the visitor guidelines for Inpatients (after your surgery is over and you are in a regular room).    Special instructions:    Oral Hygiene is also important to reduce your risk of infection.  Remember - BRUSH YOUR TEETH THE MORNING OF SURGERY WITH YOUR REGULAR TOOTHPASTE   [facility]- Preparing For Surgery  Before surgery, you can play an important role. Because skin is not sterile, your skin needs to be as free of germs as possible. You can reduce the number of germs on your skin  by washing with CHG (chlorahexidine gluconate) Soap before surgery.  CHG is an antiseptic cleaner which kills germs and bonds with the skin to continue killing germs even after washing.     Please do not use if you have an allergy to CHG or antibacterial soaps. If your skin becomes reddened/irritated stop using the CHG.  Do not shave (including legs and underarms) for at least 48 hours prior to first CHG shower. It is OK to shave your face.  Please follow these instructions  carefully.     Shower the NIGHT BEFORE SURGERY and the MORNING OF SURGERY with CHG Soap.   If you chose to wash your hair, wash your hair first as usual with your normal shampoo. After you shampoo, rinse your hair and body thoroughly to remove the shampoo.  Then [employer] and genitals (private parts) with your normal soap and rinse thoroughly to remove soap.  After that Use CHG Soap as you would any other liquid soap. You can apply CHG directly to the skin and wash gently with a scrungie or a clean washcloth.   Apply the CHG Soap to your body ONLY FROM THE NECK DOWN.  Do not use on open wounds or open sores. Avoid contact with your eyes, ears, mouth and genitals (private parts). Wash Face and genitals (private parts)  with your normal soap.   Wash thoroughly, paying special attention to the area where your surgery will be performed.  Thoroughly rinse your body with warm water from the neck down.  DO NOT shower/wash with your normal soap after using and rinsing off the CHG Soap.  Pat yourself dry with a CLEAN TOWEL.  Wear CLEAN PAJAMAS to bed the night before surgery  Place CLEAN SHEETS on your bed the night before your surgery  DO NOT SLEEP WITH PETS.   Day of Surgery:  Take a shower with CHG soap. Wear Clean/Comfortable clothing the morning of surgery Do not apply any deodorants/lotions.   Remember to brush your teeth WITH YOUR REGULAR TOOTHPASTE.    If you received a COVID test during your pre-op visit, it is requested that you wear a mask when out in public, stay away from anyone that may not be feeling well, and notify your surgeon if you develop symptoms. If you have been in contact with anyone that has tested positive in the last 10 days, please notify your surgeon.    Please read over the following fact sheets that you were given.

## 2022-05-22 ENCOUNTER — Encounter (HOSPITAL_COMMUNITY): Payer: Self-pay

## 2022-05-22 ENCOUNTER — Encounter (HOSPITAL_COMMUNITY)
Admission: RE | Admit: 2022-05-22 | Discharge: 2022-05-22 | Disposition: A | Discharge status: Home / Self Care | Payer: PPO | Source: Ambulatory Visit | Attending: Neurological Surgery | Admitting: Neurological Surgery

## 2022-05-22 ENCOUNTER — Other Ambulatory Visit: Payer: Self-pay

## 2022-05-22 VITALS — BP 142/70 | Temp 98.0°F | Resp 18 | Ht 59.0 in | Wt 144.8 lb

## 2022-05-22 DIAGNOSIS — Z01818 Encounter for other preprocedural examination: Secondary | ICD-10-CM | POA: Diagnosis not present

## 2022-05-22 DIAGNOSIS — I493 Ventricular premature depolarization: Secondary | ICD-10-CM | POA: Insufficient documentation

## 2022-05-22 DIAGNOSIS — I491 Atrial premature depolarization: Secondary | ICD-10-CM | POA: Insufficient documentation

## 2022-05-22 HISTORY — DX: Pneumonia, unspecified organism: J18.9

## 2022-05-22 LAB — SURGICAL PCR SCREEN
MRSA, PCR: NEGATIVE
Staphylococcus aureus: POSITIVE — AB

## 2022-05-22 LAB — TYPE AND SCREEN
ABO/RH(D): O POS
Antibody Screen: NEGATIVE

## 2022-05-22 LAB — CBC
HCT: 43.2 % (ref 36.0–46.0)
Hemoglobin: 14.3 g/dL (ref 12.0–15.0)
MCH: 29.1 pg (ref 26.0–34.0)
MCHC: 33.1 g/dL (ref 30.0–36.0)
MCV: 88 fL (ref 80.0–100.0)
Platelets: 265 10*3/uL (ref 150–400)
RBC: 4.91 MIL/uL (ref 3.87–5.11)
RDW: 13.2 % (ref 11.5–15.5)
WBC: 11.8 10*3/uL — ABNORMAL HIGH (ref 4.0–10.5)
nRBC: 0 % (ref 0.0–0.2)

## 2022-05-22 LAB — BASIC METABOLIC PANEL
Anion gap: 7 (ref 5–15)
BUN: 14 mg/dL (ref 8–23)
CO2: 28 mmol/L (ref 22–32)
Calcium: 9.3 mg/dL (ref 8.9–10.3)
Chloride: 102 mmol/L (ref 98–111)
Creatinine, Ser: 0.7 mg/dL (ref 0.44–1.00)
GFR, Estimated: >60 mL/min (ref >60)
Glucose, Bld: 99 mg/dL (ref 70–99)
Potassium: 3.7 mmol/L (ref 3.5–5.1)
Sodium: 137 mmol/L (ref 135–145)

## 2022-05-22 LAB — PROTIME-INR
INR: 1 (ref 0.8–1.2)
Prothrombin Time: 12.7 seconds (ref 11.4–15.2)

## 2022-05-22 NOTE — Progress Notes (Signed)
PCP - Laroy Apple Plotnikov Cardiologist - Delight Stare  PPM/ICD - pt denies Device Orders - n/a Rep Notified - n/a  Chest x-ray - n/a EKG - 05/22/22 Stress Test - pt denies ECHO - 07/19/20 Cardiac Cath - 07/19/20  Sleep Study - pt denies CPAP - n/a  Fasting Blood Sugar - pt denies diabetes  Checks Blood Sugar _____ times a day  Last dose of GLP1 agonist-  pt denies GLP1 instructions: n/a  Blood Thinner Instructions:pt denies Aspirin Instructions:Follow your surgeon's instructions on when to stop Aspirin.  If no instructions were given by your surgeon then you will need to call the office to get those instructions.     ERAS Protcol -NPO after MN PRE-SURGERY Ensure or G2- n/a  COVID TEST- n/a   Anesthesia review: yes. Patient had cardiac stents placed on 07/19/2020. Patient states she received "cardiac clearance" from [facility] for this surgery. Patient states this was given to [facility] office also. I faxed [email or web address] office a request for surgical cardiac clearance records. Fax # [phone].   Patient denies shortness of breath, fever, cough and chest pain at PAT appointment. Pt denies any current cardiac concerns.    All instructions explained to the patient, with a verbal understanding of the material. Patient agrees to go over the instructions while at home for a better understanding. Patient also instructed to self quarantine after being tested for COVID-19. The opportunity to ask questions was provided.

## 2022-05-25 NOTE — Progress Notes (Signed)
Anesthesia Chart Review:  Follows with cardiologist Dr. Doylene Canard for history of NSTEMI treated with DES to RCA and LCx 07/2020, mild aortic stenosis.  History of rheumatoid arthritis maintained on leflunomide and Enbrel.  Former smoker with associated COPD.  History of right-sided Bell's palsy.    EKG 05/22/2022: Sinus rhythm with PVCs in a pattern of bigeminy.  Rate 67.  Cath and PCI 07/19/2020:  A stent was successfully placed.    Complicated procedure due to unfamiliarity with the patient's clinical history, ongoing severe chest discomfort, and technical issues associated with the left circumflex intervention.  85% mid right coronary with TIMI III flow was reduced to 10% with TIMI grade III flow using a 30 x 3.5 Onyx deployed at high pressure.  90% mid circumflex stenosis with TIMI grade II-III flow treated with a 2.5 x 12 mm Onyx reducing stenosis to less than 10% with TIMI grade III flow.   RECOMMENDATIONS:    66-monthduration of dual antiplatelet therapy.  Aspirin and Brilinta for now.  Can switch to clopidogrel and drop Brilinta after 30 days.  Management, general orders, and clinical follow-up per Dr. ADixie Dials  TTE 07/19/2020:  1. Left ventricular ejection fraction, by estimation, is 55 to 60%. The  left ventricle has normal function. The left ventricle has no regional  wall motion abnormalities. There is mild concentric left ventricular  hypertrophy. Left ventricular diastolic  parameters are consistent with Grade I diastolic dysfunction (impaired  relaxation).   2. Right ventricular systolic function is normal. The right ventricular  size is normal. Mildly increased right ventricular wall thickness.   3. Left atrial size was mildly dilated.   4. Right atrial size was mildly dilated.   5. The mitral valve is degenerative. Mild mitral valve regurgitation.   6. The aortic valve is tricuspid. There is moderate calcification of the  aortic valve. There is moderate  thickening of the aortic valve. Aortic  valve regurgitation is mild. Mild aortic valve stenosis.   7. There is mild (Grade II) atheroma plaque involving the aortic root and  ascending aorta.   8. The inferior vena cava is normal in size with greater than 50%  respiratory variability, suggesting right atrial pressure of 3 mmHg.

## 2022-05-26 NOTE — Anesthesia Preprocedure Evaluation (Addendum)
Anesthesia Evaluation  Patient identified by MRN, date of birth, ID band Patient awake    Reviewed: Allergy & Precautions, NPO status , Patient's Chart, lab work & pertinent test results  History of Anesthesia Complications Negative for: history of anesthetic complications  Airway Mallampati: II  TM Distance: >3 FB Neck ROM: Full    Dental  (+) Dental Advisory Given, Edentulous Upper, Edentulous Lower   Pulmonary COPD, former smoker   Pulmonary exam normal        Cardiovascular + CAD, + Past MI, + Cardiac Stents (07/2020) and + Peripheral Vascular Disease  Normal cardiovascular exam+ Valvular Problems/Murmurs AS   Echo 07/19/20: EF 55-60%, mild LVH, g1dd, mild MR, mild AR, mild AS (MG 9, AVA 1.38)   Neuro/Psych    GI/Hepatic Neg liver ROS,GERD  ,,  Endo/Other  negative endocrine ROS    Renal/GU negative Renal ROS  negative genitourinary   Musculoskeletal  (+) Arthritis , Rheumatoid disorders,    Abdominal   Peds  Hematology negative hematology ROS (+)   Anesthesia Other Findings   Reproductive/Obstetrics                             Anesthesia Physical Anesthesia Plan  ASA: 3  Anesthesia Plan: General   Post-op Pain Management: Ofirmev IV (intra-op)* and Toradol IV (intra-op)*   Induction: Intravenous  PONV Risk Score and Plan: 3 and Ondansetron, Dexamethasone, Treatment may vary due to age or medical condition and Midazolam  Airway Management Planned: Oral ETT  Additional Equipment: Arterial line  Intra-op Plan:   Post-operative Plan: Extubation in OR  Informed Consent: I have reviewed the patients History and Physical, chart, labs and discussed the procedure including the risks, benefits and alternatives for the proposed anesthesia with the patient or authorized representative who has indicated his/her understanding and acceptance.     Dental advisory given  Plan Discussed  with:   Anesthesia Plan Comments: (PAT note by Karoline Caldwell, PA-C: Follows with cardiologist Dr. Doylene Canard for history of NSTEMI treated with DES to RCA and LCx 07/2020, mild aortic stenosis. Last seen by Dr. Doylene Canard 04/21/22 and discussed upcoming surgery. Per note, " May undergo back surgery in the absence of chest pain with activity."   History of rheumatoid arthritis maintained on leflunomide and Enbrel.  Former smoker with associated COPD. Not on any daily inhaled medications.  History of right-sided Bell's palsy.  History of C3-7 anterior/posterior fusion.  Preop labs reviewed, unremarkable.   EKG 05/22/2022: Sinus rhythm with PVCs in a pattern of bigeminy.  Rate 67.  Cath and PCI 07/19/2020:  A stent was successfully placed.    Complicated procedure due to unfamiliarity with the patient's clinical history, ongoing severe chest discomfort, and technical issues associated with the left circumflex intervention.  85% mid right coronary with TIMI III flow was reduced to 10% with TIMI grade III flow using a 30 x 3.5 Onyx deployed at high pressure.  90% mid circumflex stenosis with TIMI grade II-III flow treated with a 2.5 x 12 mm Onyx reducing stenosis to less than 10% with TIMI grade III flow.   RECOMMENDATIONS:    62-monthduration of dual antiplatelet therapy.  Aspirin and Brilinta for now.  Can switch to clopidogrel and drop Brilinta after 30 days.  Management, general orders, and clinical follow-up per Dr. ADixie Dials  TTE 07/19/2020: 1. Left ventricular ejection fraction, by estimation, is 55 to 60%. The  left ventricle has normal  function. The left ventricle has no regional  wall motion abnormalities. There is mild concentric left ventricular  hypertrophy. Left ventricular diastolic  parameters are consistent with Grade I diastolic dysfunction (impaired  relaxation).  2. Right ventricular systolic function is normal. The right ventricular  size is normal. Mildly increased  right ventricular wall thickness.  3. Left atrial size was mildly dilated.  4. Right atrial size was mildly dilated.  5. The mitral valve is degenerative. Mild mitral valve regurgitation.  6. The aortic valve is tricuspid. There is moderate calcification of the  aortic valve. There is moderate thickening of the aortic valve. Aortic  valve regurgitation is mild. Mild aortic valve stenosis.  7. There is mild (Grade II) atheroma plaque involving the aortic root and  ascending aorta.  8. The inferior vena cava is normal in size with greater than 50%  respiratory variability, suggesting right atrial pressure of 3 mmHg.    )        Anesthesia Quick Evaluation

## 2022-05-27 ENCOUNTER — Encounter (HOSPITAL_COMMUNITY): Payer: Self-pay | Admitting: Neurological Surgery

## 2022-05-27 ENCOUNTER — Ambulatory Visit (HOSPITAL_BASED_OUTPATIENT_CLINIC_OR_DEPARTMENT_OTHER): Payer: PPO | Admitting: Physician Assistant

## 2022-05-27 ENCOUNTER — Other Ambulatory Visit: Payer: Self-pay

## 2022-05-27 ENCOUNTER — Observation Stay (HOSPITAL_COMMUNITY)
Admission: RE | Admit: 2022-05-27 | Discharge: 2022-05-28 | Disposition: A | Discharge status: Home / Self Care | Payer: PPO | Attending: Neurological Surgery | Admitting: Neurological Surgery

## 2022-05-27 ENCOUNTER — Ambulatory Visit (HOSPITAL_COMMUNITY): Payer: PPO

## 2022-05-27 ENCOUNTER — Ambulatory Visit (HOSPITAL_COMMUNITY): Payer: PPO | Admitting: Physician Assistant

## 2022-05-27 ENCOUNTER — Encounter (HOSPITAL_COMMUNITY)
Admission: RE | Disposition: A | Discharge status: Home / Self Care | Payer: Self-pay | Source: Home / Self Care | Attending: Neurological Surgery

## 2022-05-27 DIAGNOSIS — M79604 Pain in right leg: Secondary | ICD-10-CM | POA: Insufficient documentation

## 2022-05-27 DIAGNOSIS — J449 Chronic obstructive pulmonary disease, unspecified: Secondary | ICD-10-CM | POA: Diagnosis not present

## 2022-05-27 DIAGNOSIS — I2510 Atherosclerotic heart disease of native coronary artery without angina pectoris: Secondary | ICD-10-CM

## 2022-05-27 DIAGNOSIS — M5416 Radiculopathy, lumbar region: Secondary | ICD-10-CM | POA: Diagnosis not present

## 2022-05-27 DIAGNOSIS — M48061 Spinal stenosis, lumbar region without neurogenic claudication: Principal | ICD-10-CM | POA: Insufficient documentation

## 2022-05-27 DIAGNOSIS — Z8541 Personal history of malignant neoplasm of cervix uteri: Secondary | ICD-10-CM | POA: Diagnosis not present

## 2022-05-27 DIAGNOSIS — M4186 Other forms of scoliosis, lumbar region: Secondary | ICD-10-CM | POA: Diagnosis not present

## 2022-05-27 DIAGNOSIS — I252 Old myocardial infarction: Secondary | ICD-10-CM | POA: Diagnosis not present

## 2022-05-27 DIAGNOSIS — Z981 Arthrodesis status: Secondary | ICD-10-CM | POA: Diagnosis not present

## 2022-05-27 DIAGNOSIS — Z87891 Personal history of nicotine dependence: Secondary | ICD-10-CM | POA: Diagnosis not present

## 2022-05-27 DIAGNOSIS — M4156 Other secondary scoliosis, lumbar region: Secondary | ICD-10-CM

## 2022-05-27 DIAGNOSIS — Z96641 Presence of right artificial hip joint: Secondary | ICD-10-CM | POA: Insufficient documentation

## 2022-05-27 DIAGNOSIS — M961 Postlaminectomy syndrome, not elsewhere classified: Secondary | ICD-10-CM | POA: Insufficient documentation

## 2022-05-27 DIAGNOSIS — Z7982 Long term (current) use of aspirin: Secondary | ICD-10-CM | POA: Insufficient documentation

## 2022-05-27 DIAGNOSIS — Z79899 Other long term (current) drug therapy: Secondary | ICD-10-CM | POA: Insufficient documentation

## 2022-05-27 SURGERY — POSTERIOR LUMBAR FUSION 1 LEVEL
Anesthesia: General | Site: Back

## 2022-05-27 MED ORDER — THROMBIN 5000 UNITS EX SOLR
CUTANEOUS | Status: AC
  Filled 2022-05-27: qty 5000

## 2022-05-27 MED ORDER — DEXAMETHASONE 4 MG PO TABS: 4.0000 mg | ORAL_TABLET | Freq: Four times a day (QID) | ORAL | Status: DC

## 2022-05-27 MED ORDER — HYDROMORPHONE HCL 1 MG/ML IJ SOLN
0.2500 mg | INTRAMUSCULAR | Status: DC | PRN
  Administered 2022-05-27 (×4): 0.5 mg via INTRAVENOUS

## 2022-05-27 MED ORDER — CHLORHEXIDINE GLUCONATE CLOTH 2 % EX PADS: 6.0000 | MEDICATED_PAD | Freq: Once | CUTANEOUS | Status: DC

## 2022-05-27 MED ORDER — BUPIVACAINE HCL (PF) 0.25 % IJ SOLN
INTRAMUSCULAR | Status: DC | PRN
  Administered 2022-05-27: 4 mL

## 2022-05-27 MED ORDER — ONDANSETRON HCL 4 MG/2ML IJ SOLN: 4.0000 mg | Freq: Four times a day (QID) | INTRAMUSCULAR | Status: DC | PRN

## 2022-05-27 MED ORDER — CEFAZOLIN SODIUM-DEXTROSE 2-4 GM/100ML-% IV SOLN
2.0000 g | INTRAVENOUS | Status: AC
  Administered 2022-05-27: 2 g via INTRAVENOUS
  Filled 2022-05-27: qty 100

## 2022-05-27 MED ORDER — ACETAMINOPHEN 500 MG PO TABS
1000.0000 mg | ORAL_TABLET | Freq: Four times a day (QID) | ORAL | Status: DC
  Administered 2022-05-27 – 2022-05-28 (×3): 1000 mg via ORAL
  Filled 2022-05-27 (×3): qty 2

## 2022-05-27 MED ORDER — PHENYLEPHRINE 80 MCG/ML (10ML) SYRINGE FOR IV PUSH (FOR BLOOD PRESSURE SUPPORT)
PREFILLED_SYRINGE | INTRAVENOUS | Status: DC | PRN
  Administered 2022-05-27: 40 ug via INTRAVENOUS

## 2022-05-27 MED ORDER — VANCOMYCIN HCL IN DEXTROSE 1-5 GM/200ML-% IV SOLN
INTRAVENOUS | Status: AC
  Filled 2022-05-27: qty 200

## 2022-05-27 MED ORDER — METHOCARBAMOL 1000 MG/10ML IJ SOLN: 500.0000 mg | Freq: Four times a day (QID) | INTRAVENOUS | Status: DC | PRN

## 2022-05-27 MED ORDER — OXYCODONE HCL 5 MG PO TABS: 5.0000 mg | ORAL_TABLET | Freq: Once | ORAL | Status: AC | PRN

## 2022-05-27 MED ORDER — DEXAMETHASONE SODIUM PHOSPHATE 10 MG/ML IJ SOLN
INTRAMUSCULAR | Status: DC | PRN
  Administered 2022-05-27: 10 mg via INTRAVENOUS

## 2022-05-27 MED ORDER — FENTANYL CITRATE (PF) 250 MCG/5ML IJ SOLN
INTRAMUSCULAR | Status: AC
  Filled 2022-05-27: qty 5

## 2022-05-27 MED ORDER — FENTANYL CITRATE (PF) 250 MCG/5ML IJ SOLN
INTRAMUSCULAR | Status: DC | PRN
  Administered 2022-05-27 (×3): 50 ug via INTRAVENOUS

## 2022-05-27 MED ORDER — CEFAZOLIN SODIUM-DEXTROSE 2-4 GM/100ML-% IV SOLN
2.0000 g | Freq: Three times a day (TID) | INTRAVENOUS | Status: AC
  Administered 2022-05-27 – 2022-05-28 (×2): 2 g via INTRAVENOUS
  Filled 2022-05-27 (×2): qty 100

## 2022-05-27 MED ORDER — OXYCODONE HCL 5 MG/5ML PO SOLN
ORAL | Status: AC
  Filled 2022-05-27: qty 5

## 2022-05-27 MED ORDER — ONDANSETRON HCL 4 MG PO TABS: 4.0000 mg | ORAL_TABLET | Freq: Four times a day (QID) | ORAL | Status: DC | PRN

## 2022-05-27 MED ORDER — THROMBIN 20000 UNITS EX SOLR
CUTANEOUS | Status: DC | PRN
  Administered 2022-05-27: 20 mL via TOPICAL

## 2022-05-27 MED ORDER — HYDROMORPHONE HCL 1 MG/ML IJ SOLN
INTRAMUSCULAR | Status: AC
  Filled 2022-05-27: qty 1

## 2022-05-27 MED ORDER — PHENYLEPHRINE HCL-NACL 20-0.9 MG/250ML-% IV SOLN
INTRAVENOUS | Status: DC | PRN
  Administered 2022-05-27: 20 ug/min via INTRAVENOUS

## 2022-05-27 MED ORDER — BUPIVACAINE HCL (PF) 0.25 % IJ SOLN
INTRAMUSCULAR | Status: AC
  Filled 2022-05-27: qty 30

## 2022-05-27 MED ORDER — THROMBIN 20000 UNITS EX SOLR
CUTANEOUS | Status: AC
  Filled 2022-05-27: qty 20000

## 2022-05-27 MED ORDER — SODIUM CHLORIDE 0.9% FLUSH
3.0000 mL | Freq: Two times a day (BID) | INTRAVENOUS | Status: DC
  Administered 2022-05-27: 3 mL via INTRAVENOUS

## 2022-05-27 MED ORDER — DEXAMETHASONE SODIUM PHOSPHATE 4 MG/ML IJ SOLN
4.0000 mg | Freq: Four times a day (QID) | INTRAMUSCULAR | Status: DC
  Administered 2022-05-27 – 2022-05-28 (×3): 4 mg via INTRAVENOUS
  Filled 2022-05-27 (×3): qty 1

## 2022-05-27 MED ORDER — PROPOFOL 10 MG/ML IV BOLUS
INTRAVENOUS | Status: DC | PRN
  Administered 2022-05-27: 150 mg via INTRAVENOUS
  Administered 2022-05-27: 30 mg via INTRAVENOUS

## 2022-05-27 MED ORDER — LEFLUNOMIDE 10 MG PO TABS
20.0000 mg | ORAL_TABLET | Freq: Every day | ORAL | Status: DC
  Filled 2022-05-27: qty 2

## 2022-05-27 MED ORDER — ONDANSETRON HCL 4 MG/2ML IJ SOLN: 4.0000 mg | Freq: Once | INTRAMUSCULAR | Status: DC | PRN

## 2022-05-27 MED ORDER — ASPIRIN 81 MG PO TBEC: 81.0000 mg | DELAYED_RELEASE_TABLET | Freq: Every day | ORAL | Status: DC

## 2022-05-27 MED ORDER — PHENOL 1.4 % MT LIQD: 1.0000 | OROMUCOSAL | Status: DC | PRN

## 2022-05-27 MED ORDER — KETAMINE HCL 50 MG/5ML IJ SOSY
PREFILLED_SYRINGE | INTRAMUSCULAR | Status: AC
  Filled 2022-05-27: qty 5

## 2022-05-27 MED ORDER — THROMBIN 5000 UNITS EX SOLR
OROMUCOSAL | Status: DC | PRN
  Administered 2022-05-27: 5 mL via TOPICAL

## 2022-05-27 MED ORDER — MORPHINE SULFATE (PF) 2 MG/ML IV SOLN: 2.0000 mg | INTRAVENOUS | Status: DC | PRN

## 2022-05-27 MED ORDER — LACTATED RINGERS IV SOLN: INTRAVENOUS | Status: DC

## 2022-05-27 MED ORDER — CHLORHEXIDINE GLUCONATE 0.12 % MT SOLN
15.0000 mL | Freq: Once | OROMUCOSAL | Status: AC
  Administered 2022-05-27: 15 mL via OROMUCOSAL
  Filled 2022-05-27: qty 15

## 2022-05-27 MED ORDER — PROPOFOL 10 MG/ML IV BOLUS
INTRAVENOUS | Status: AC
  Filled 2022-05-27: qty 20

## 2022-05-27 MED ORDER — OXYCODONE HCL 5 MG PO TABS
10.0000 mg | ORAL_TABLET | ORAL | Status: DC | PRN
  Administered 2022-05-27 – 2022-05-28 (×3): 10 mg via ORAL
  Filled 2022-05-27 (×3): qty 2

## 2022-05-27 MED ORDER — EPHEDRINE SULFATE-NACL 50-0.9 MG/10ML-% IV SOSY
PREFILLED_SYRINGE | INTRAVENOUS | Status: DC | PRN
  Administered 2022-05-27 (×2): 2.5 mg via INTRAVENOUS

## 2022-05-27 MED ORDER — METHOCARBAMOL 500 MG PO TABS
500.0000 mg | ORAL_TABLET | Freq: Four times a day (QID) | ORAL | Status: DC | PRN
  Administered 2022-05-27: 500 mg via ORAL
  Filled 2022-05-27: qty 1

## 2022-05-27 MED ORDER — 0.9 % SODIUM CHLORIDE (POUR BTL) OPTIME
TOPICAL | Status: DC | PRN
  Administered 2022-05-27 (×2): 1000 mL

## 2022-05-27 MED ORDER — KETAMINE HCL 10 MG/ML IJ SOLN
INTRAMUSCULAR | Status: DC | PRN
  Administered 2022-05-27: 20 mg via INTRAVENOUS
  Administered 2022-05-27 (×2): 10 mg via INTRAVENOUS

## 2022-05-27 MED ORDER — MENTHOL 3 MG MT LOZG: 1.0000 | LOZENGE | OROMUCOSAL | Status: DC | PRN

## 2022-05-27 MED ORDER — SUGAMMADEX SODIUM 200 MG/2ML IV SOLN
INTRAVENOUS | Status: DC | PRN
  Administered 2022-05-27: 200 mg via INTRAVENOUS

## 2022-05-27 MED ORDER — LIDOCAINE 2% (20 MG/ML) 5 ML SYRINGE
INTRAMUSCULAR | Status: DC | PRN
  Administered 2022-05-27: 60 mg via INTRAVENOUS

## 2022-05-27 MED ORDER — OXYCODONE HCL 5 MG/5ML PO SOLN
5.0000 mg | Freq: Once | ORAL | Status: AC | PRN
  Administered 2022-05-27: 5 mg via ORAL

## 2022-05-27 MED ORDER — POTASSIUM CHLORIDE IN NACL 20-0.9 MEQ/L-% IV SOLN: INTRAVENOUS | Status: DC

## 2022-05-27 MED ORDER — HYDROMORPHONE HCL 1 MG/ML IJ SOLN
INTRAMUSCULAR | Status: DC | PRN
  Administered 2022-05-27: .5 mg via INTRAVENOUS

## 2022-05-27 MED ORDER — AMISULPRIDE (ANTIEMETIC) 5 MG/2ML IV SOLN: 10.0000 mg | Freq: Once | INTRAVENOUS | Status: DC | PRN

## 2022-05-27 MED ORDER — PREDNISONE 1 MG PO TABS: 4.0000 mg | ORAL_TABLET | Freq: Every day | ORAL | Status: DC

## 2022-05-27 MED ORDER — HYDROMORPHONE HCL 1 MG/ML IJ SOLN
INTRAMUSCULAR | Status: AC
  Filled 2022-05-27: qty 0.5

## 2022-05-27 MED ORDER — HEMOSTATIC AGENTS (NO CHARGE) OPTIME
TOPICAL | Status: DC | PRN
  Administered 2022-05-27: 1 via TOPICAL

## 2022-05-27 MED ORDER — ORAL CARE MOUTH RINSE: 15.0000 mL | Freq: Once | OROMUCOSAL | Status: AC

## 2022-05-27 MED ORDER — SODIUM CHLORIDE 0.9 % IV SOLN
250.0000 mL | INTRAVENOUS | Status: DC
  Administered 2022-05-27: 250 mL via INTRAVENOUS

## 2022-05-27 MED ORDER — ROCURONIUM BROMIDE 10 MG/ML (PF) SYRINGE
PREFILLED_SYRINGE | INTRAVENOUS | Status: DC | PRN
  Administered 2022-05-27: 20 mg via INTRAVENOUS
  Administered 2022-05-27: 30 mg via INTRAVENOUS
  Administered 2022-05-27: 70 mg via INTRAVENOUS

## 2022-05-27 MED ORDER — CHLORHEXIDINE GLUCONATE 0.12 % MT SOLN
OROMUCOSAL | Status: AC
  Filled 2022-05-27: qty 15

## 2022-05-27 MED ORDER — SODIUM CHLORIDE 0.9% FLUSH: 3.0000 mL | INTRAVENOUS | Status: DC | PRN

## 2022-05-27 SURGICAL SUPPLY — 65 items
ADH SKN CLS APL DERMABOND .7 (GAUZE/BANDAGES/DRESSINGS) ×1
APL SKNCLS STERI-STRIP NONHPOA (GAUZE/BANDAGES/DRESSINGS) ×1
BAG COUNTER SPONGE SURGICOUNT (BAG) ×2 IMPLANT
BAG SPNG CNTER NS LX DISP (BAG) ×2
BASKET BONE COLLECTION (BASKET) ×2 IMPLANT
BENZOIN TINCTURE PRP APPL 2/3 (GAUZE/BANDAGES/DRESSINGS) ×2 IMPLANT
BLADE BONE MILL MEDIUM (MISCELLANEOUS) ×2 IMPLANT
BLADE CLIPPER SURG (BLADE) IMPLANT
BUR CARBIDE MATCH 3.0 (BURR) ×2 IMPLANT
CANISTER SUCT 3000ML PPV (MISCELLANEOUS) ×2 IMPLANT
CNTNR URN SCR LID CUP LEK RST (MISCELLANEOUS) ×2 IMPLANT
CONT SPEC 4OZ STRL OR WHT (MISCELLANEOUS) ×1
COVER BACK TABLE 60X90IN (DRAPES) ×2 IMPLANT
DERMABOND ADVANCED .7 DNX12 (GAUZE/BANDAGES/DRESSINGS) ×2 IMPLANT
DRAPE C-ARM 42X72 X-RAY (DRAPES) ×4 IMPLANT
DRAPE LAPAROTOMY 100X72X124 (DRAPES) ×2 IMPLANT
DRAPE SURG 17X23 STRL (DRAPES) ×2 IMPLANT
DRSG OPSITE POSTOP 4X6 (GAUZE/BANDAGES/DRESSINGS) IMPLANT
DURAPREP 26ML APPLICATOR (WOUND CARE) ×2 IMPLANT
ELECT REM PT RETURN 9FT ADLT (ELECTROSURGICAL) ×1
ELECTRODE REM PT RTRN 9FT ADLT (ELECTROSURGICAL) ×2 IMPLANT
EVACUATOR 1/8 PVC DRAIN (DRAIN) ×2 IMPLANT
GAUZE 4X4 16PLY ~~LOC~~+RFID DBL (SPONGE) IMPLANT
GLOVE BIO SURGEON STRL SZ7 (GLOVE) IMPLANT
GLOVE BIO SURGEON STRL SZ8 (GLOVE) ×4 IMPLANT
GLOVE BIOGEL PI IND STRL 7.0 (GLOVE) IMPLANT
GOWN STRL REUS W/ TWL LRG LVL3 (GOWN DISPOSABLE) IMPLANT
GOWN STRL REUS W/ TWL XL LVL3 (GOWN DISPOSABLE) ×4 IMPLANT
GOWN STRL REUS W/TWL 2XL LVL3 (GOWN DISPOSABLE) IMPLANT
GOWN STRL REUS W/TWL LRG LVL3 (GOWN DISPOSABLE)
GOWN STRL REUS W/TWL XL LVL3 (GOWN DISPOSABLE) ×2
GRAFT BONE PROTEIOS LRG 5CC (Orthopedic Implant) IMPLANT
HEMOSTAT POWDER KIT SURGIFOAM (HEMOSTASIS) ×2 IMPLANT
KIT BASIN OR (CUSTOM PROCEDURE TRAY) ×2 IMPLANT
KIT GRAFTMAG DEL NEURO DISP (NEUROSURGERY SUPPLIES) IMPLANT
KIT TURNOVER KIT B (KITS) ×2 IMPLANT
MATRIX STRIP NEOCORE 12C (Putty) IMPLANT
MILL BONE PREP (MISCELLANEOUS) ×2 IMPLANT
NDL HYPO 25X1 1.5 SAFETY (NEEDLE) ×2 IMPLANT
NEEDLE HYPO 25X1 1.5 SAFETY (NEEDLE) ×1 IMPLANT
NS IRRIG 1000ML POUR BTL (IV SOLUTION) ×2 IMPLANT
PACK LAMINECTOMY NEURO (CUSTOM PROCEDURE TRAY) ×2 IMPLANT
PAD ARMBOARD 7.5X6 YLW CONV (MISCELLANEOUS) ×6 IMPLANT
ROD LORD LIPPED TI 5.5X35 (Rod) IMPLANT
SCREW CORT SHANK MOD 6.5X40 (Screw) IMPLANT
SCREW KODIAK 6.5X40 (Screw) IMPLANT
SCREW POLYAXIAL TULIP (Screw) IMPLANT
SEALANT ADHERUS EXTEND TIP (MISCELLANEOUS) IMPLANT
SET SCREW (Screw) ×4 IMPLANT
SET SCREW SPNE (Screw) IMPLANT
SPACER IDENTI PS 9X9X25 5D (Spacer) IMPLANT
SPACER IDENTITI PS 5D 8X9X25 (Spacer) IMPLANT
SPONGE SURGIFOAM ABS GEL 100 (HEMOSTASIS) ×2 IMPLANT
SPONGE T-LAP 4X18 ~~LOC~~+RFID (SPONGE) IMPLANT
STRIP CLOSURE SKIN 1/2X4 (GAUZE/BANDAGES/DRESSINGS) ×4 IMPLANT
STRIP MATRIX NEOCORE 12CC (Putty) ×1 IMPLANT
SUT VIC AB 0 CT1 18XCR BRD8 (SUTURE) ×2 IMPLANT
SUT VIC AB 0 CT1 8-18 (SUTURE) ×1
SUT VIC AB 2-0 CP2 18 (SUTURE) ×2 IMPLANT
SUT VIC AB 3-0 SH 8-18 (SUTURE) ×4 IMPLANT
SYR CONTROL 10ML LL (SYRINGE) IMPLANT
TOWEL GREEN STERILE (TOWEL DISPOSABLE) ×2 IMPLANT
TOWEL GREEN STERILE FF (TOWEL DISPOSABLE) ×2 IMPLANT
TRAY FOLEY MTR SLVR 16FR STAT (SET/KITS/TRAYS/PACK) ×2 IMPLANT
WATER STERILE IRR 1000ML POUR (IV SOLUTION) ×2 IMPLANT

## 2022-05-27 NOTE — Op Note (Signed)
05/27/2022  1:44 PM  PATIENT:  Debra Farmer  75 y.o. 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 Decompressive lumbar laminectomy, hemi facetectomy and foraminotomies L4-5 with osteotomy to address her curvature and deformity, requiring more work than would be required for a simple exposure of the disk for PLIF in order to adequately decompress the neural elements and address the spinal stenosis 2. Posterior lumbar interbody fusion L4-5 using PTI interbody cages packed with morcellized allograft and autograft  3. Posterior fixation L4-5 using Alphatec cortical pedicle screws.  4. Intertransverse arthrodesis L4-5 right using morcellized autograft and allograft.  SURGEON:  Sherley Bounds, MD  ASSISTANTS: Dr. Kathyrn Sheriff  ANESTHESIA:  General  EBL: 150 ml  Total I/O In: 1000 [I.V.:1000] Out: 275 [Urine:125; Blood:150]  BLOOD ADMINISTERED:none  DRAINS: none   INDICATION FOR PROCEDURE: This patient presented with severe back pain with right leg pain. Imaging revealed previous surgery at L4-5 and L5-S1 with scoliosis and right foraminal stenosis. The patient tried a reasonable attempt at conservative medical measures without relief. I recommended decompression and instrumented fusion to address the stenosis as well as the segmental  instability and spinal deformity.  Patient understood the risks, benefits, and alternatives and potential outcomes and wished to proceed.  PROCEDURE DETAILS:  The patient was brought to the operating room. After induction of generalized endotracheal anesthesia the patient was rolled into the prone position on chest rolls and all pressure points were padded. The patient's lumbar region was cleaned and then prepped with DuraPrep and draped in the usual sterile fashion. Anesthesia was injected and then a dorsal  midline incision was made and carried down to the lumbosacral fascia. The fascia was opened and the paraspinous musculature was taken down in a subperiosteal fashion to expose L4-5. A self-retaining retractor was placed. Intraoperative fluoroscopy confirmed my level, and I started with placement of the L4 cortical pedicle screws. The pedicle screw entry zones were identified utilizing surface landmarks and  AP and lateral fluoroscopy. I scored the cortex with the high-speed drill and then used the hand drill to drill an upward and outward direction into the pedicle. I then tapped line to line. I then placed a 6.5 x 40 mm cortical pedicle screw into the pedicles of L4 bilaterally.    I then turned my attention to the decompression and complete lumbar laminectomies, hemi- facetectomies, and foraminotomies were performed at L4-5.  My nurse practitioner was directly involved in the decompression and exposure of the neural elements. the patient had significant spinal stenosis and this required more work than would be required for a simple exposure of the disc for posterior lumbar interbody fusion which would only require a limited laminotomy. Much more generous decompression and generous foraminotomy was undertaken in order to adequately decompress the neural elements and address the patient's leg pain.  Also, because of her deformity and scoliosis we ended up performing osteotomies to correct her curvature. the yellow ligament was removed to expose the underlying dura and nerve roots, and generous foraminotomies were performed to adequately decompress the neural elements. Both the exiting and traversing nerve roots were decompressed on both sides until a coronary dilator passed easily along the nerve roots. Once the decompression was complete, I turned my attention to the posterior lower lumbar interbody fusion. The epidural venous vasculature was coagulated and cut sharply. Disc space was incised and the initial  discectomy was performed with pituitary rongeurs. The disc space was distracted with sequential distractors to a height of 8 mm on the right and 9 mm on the left. We then used a series of scrapers and shavers to prepare the endplates for fusion. The midline was prepared with Epstein curettes. Once the complete discectomy was finished, we packed an appropriate sized interbody cage with local autograft and morcellized allograft, gently retracted the nerve root, and tapped the cage into position at L4-5.  The midline between the cages was packed with morselized autograft and allograft.   We then turned our attention to the placement of the lower pedicle screws. The pedicle screw entry zones were identified utilizing surface landmarks and fluoroscopy. I drilled into each pedicle utilizing the hand drill, and tapped each pedicle with the appropriate tap. We palpated with a ball probe to assure no break in the cortex. We then placed 0.5 x 40 mm pedicle screws into the pedicles bilaterally at L5.  My nurse practitioner assisted in placement of the pedicle screws.  We then decorticated the transverse processes and laid a mixture of morcellized autograft and allograft out over these to perform intertransverse arthrodesis at L4-5 on the right. We then placed lordotic rods into the multiaxial screw heads of the pedicle screws and locked these in position with the locking caps and anti-torque device. We then checked our construct with AP and lateral fluoroscopy. Irrigated with copious amounts of bacitracin-containing saline solution. Inspected the nerve roots once again to assure adequate decompression, lined to the dura with Gelfoam,  and then we closed the muscle and the fascia with 0 Vicryl. Closed the subcutaneous tissues with 2-0 Vicryl and subcuticular tissues with 3-0 Vicryl. The skin was closed with benzoin and Steri-Strips. Dressing was then applied, the patient was awakened from general anesthesia and transported to  the recovery room in stable condition. At the end of the procedure all sponge, needle and instrument counts were correct.   PLAN OF CARE: admit to inpatient  PATIENT DISPOSITION:  PACU - hemodynamically stable.   Delay start of Pharmacological VTE agent (>24hrs) due to surgical blood loss or risk of bleeding:  yes

## 2022-05-27 NOTE — Anesthesia Postprocedure Evaluation (Signed)
Anesthesia Post Note  Patient: Debra Farmer  Procedure(s) Performed: Posterior Lumbar Interbody Fusion - Lumbar Four- Lumbar Five (Back)     Patient location during evaluation: PACU Anesthesia Type: General Level of consciousness: awake and alert Pain management: pain level controlled Vital Signs Assessment: post-procedure vital signs reviewed and stable Respiratory status: spontaneous breathing, nonlabored ventilation and respiratory function stable Cardiovascular status: blood pressure returned to baseline and stable Postop Assessment: no apparent nausea or vomiting Anesthetic complications: no   No notable events documented.  Last Vitals:  Vitals:   05/27/22 1515 05/27/22 1530  BP: 129/69 (!) 131/51  Pulse: 72 73  Resp: 11 13  Temp:  36.6 C  SpO2: 98% 99%    Last Pain:  Vitals:   05/27/22 1530  TempSrc:   PainSc: 3                  Lidia Collum

## 2022-05-27 NOTE — Anesthesia Procedure Notes (Signed)
Procedure Name: Intubation Date/Time: 05/27/2022 10:28 AM  Performed by: Georgia Duff, CRNAPre-anesthesia Checklist: Patient identified, Emergency Drugs available, Suction available and Patient being monitored Patient Re-evaluated:Patient Re-evaluated prior to induction Oxygen Delivery Method: Circle System Utilized Preoxygenation: Pre-oxygenation with 100% oxygen Induction Type: IV induction Ventilation: Mask ventilation without difficulty Laryngoscope Size: Miller and 2 Grade View: Grade I Tube type: Oral Tube size: 7.0 mm Number of attempts: 1 Airway Equipment and Method: Stylet and Oral airway Placement Confirmation: ETT inserted through vocal cords under direct vision, positive ETCO2 and breath sounds checked- equal and bilateral Secured at: 20 cm Tube secured with: Tape Dental Injury: Teeth and Oropharynx as per pre-operative assessment

## 2022-05-27 NOTE — H&P (Signed)
Subjective: Patient is a 76 y.o. female admitted for plif L4-5. Onset of symptoms was several years ago, gradually worsening since that time.  The pain is rated severe, and is located at the across the lower back and radiates to legs. The pain is described as aching and occurs all day. The symptoms have been progressive. Symptoms are exacerbated by exercise and standing. 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, right   Pneumonia    10 years ago per pt    Past Surgical History:  Procedure Laterality Date   ABDOMINAL HYSTERECTOMY     partial   ANTERIOR CERVICAL DECOMPRESSION/DISCECTOMY FUSION 4 LEVELS N/A 04/03/2020   Procedure: ANTERIOR CERVICAL DECOMPRESSION/DISCECTOMY FUSION C3-7;  Surgeon: Melina Schools, MD;  Location: [facility];  Service: Orthopedics;  Laterality: N/A;  5 hrs   APPENDECTOMY     CATARACT EXTRACTION Bilateral    about 3 years ago    CHOLECYSTECTOMY     CORONARY STENT INTERVENTION N/A 07/19/2020   Procedure: CORONARY STENT INTERVENTION;  Surgeon: Belva Crome, MD;  Location: [facility];  Service: Cardiovascular;  Laterality: N/A;   KNEE ARTHROSCOPY  1999   Dr Theda Sers -- R knee   LEFT HEART CATH AND CORONARY ANGIOGRAPHY N/A 07/19/2020   Procedure: LEFT HEART CATH AND CORONARY ANGIOGRAPHY;  Surgeon: Dixie Dials, MD;  Location: [facility];  Service: Cardiovascular;  Laterality: N/A;   LUMBAR LAMINECTOMY/DECOMPRESSION MICRODISCECTOMY Right 08/26/2016   Procedure: Microlumbar decompression L4-5, L5-S1 on Right;  Surgeon: Susa Day, MD;  Location: [facility];  Service: Orthopedics;  Laterality: Right;   POSTERIOR  CERVICAL FUSION/FORAMINOTOMY N/A 04/04/2020   Procedure: POSTERIOR SPINAL FUSION INSTRUMENTATION CERVICAL THREE THROUGH SEVEN, CERVICAL THREE LAMINOTOMY;  Surgeon: Melina Schools, MD;  Location: [facility];  Service: Orthopedics;  Laterality: N/A;  5 hrs   TOTAL HIP ARTHROPLASTY Right 04/01/2017   Procedure: RIGHT TOTAL HIP ARTHROPLASTY ANTERIOR APPROACH;  Surgeon: Rod Can, MD;  Location: [facility];  Service: Orthopedics;  Laterality: Right;  Needs RNFA    Prior to Admission medications   Medication Sig Start Date End Date Taking? Authorizing Provider  aspirin EC 81 MG EC tablet Take 1 tablet (81 mg total) by mouth daily. Swallow whole. 07/21/20  Yes Dixie Dials, MD  Cholecalciferol (VITAMIN D3) 5000 units TABS Take 5,000 Units by mouth in the morning and at bedtime. WITH A GLASS OF VANILLA-HONEY ALMOND MILK   Yes [provider]  Cyanocobalamin (VITAMIN B-12) 5000 MCG TBDP Take 5,000 mcg by mouth daily.   Yes [provider]  etanercept (ENBREL SURECLICK) 50 MG/ML injection Inject 50 mg into the skin every Thursday.   Yes [provider]  furosemide (LASIX) 20 MG tablet TAKE 1-2 TABLETS BY MOUTH DAILY AS NEEDED FOR EDEMA 03/04/22  Yes Plotnikov, Evie Lacks, MD  leflunomide (ARAVA) 20 MG tablet Take 20 mg by mouth daily.   Yes [provider]  linaclotide (LINZESS) 290 MCG CAPS capsule Take 1 capsule (290 mcg total) by mouth daily. Patient taking differently: Take 290 mcg by mouth daily as needed (constipation). 11/11/21  Yes Plotnikov, Evie Lacks, MD  Multiple Vitamins-Minerals (MULTIVITAMIN WITH MINERALS) tablet Take 1 tablet by mouth daily. Immune Vitamin   Yes [provider]  nitroGLYCERIN (NITROSTAT) 0.4 MG SL tablet Place 1 tablet (0.4 mg total) under the tongue every 5 (five) minutes x 3 doses as needed for chest pain. 07/21/20  Yes Dixie Dials, MD  potassium chloride (KLOR-CON) 10 MEQ tablet Take 1 tablet (10 mEq total) by mouth daily. Patient  taking differently: Take 10 mEq by mouth daily as needed (Takes with Lasix). 03/12/21  Yes Plotnikov, Evie Lacks, MD  predniSONE (DELTASONE) 1 MG tablet Take 4 mg by mouth daily with breakfast.   Yes [provider]  traMADol (ULTRAM) 50 MG tablet TAKE 1 TABLET BY MOUTH EVERY 6 HOURS Patient taking differently: Take 50 mg by mouth every 4 (four) hours as needed for moderate pain or severe pain. 02/27/22  Yes Plotnikov, Evie Lacks, MD  zolpidem (AMBIEN) 10 MG tablet TAKE 1 TABLET BY MOUTH EACH NIGHT AT BEDTIME FOR SLEEP Patient taking differently: Take 10 mg by mouth at bedtime as needed for sleep. 04/17/22  Yes Plotnikov, Evie Lacks, MD   Allergies  Allergen Reactions   Aspirin Other (See Comments)    MUST HAVE COATED ASA   Codeine Sulfate Other (See Comments)    "drugged out feeling" & hallucinations   Diltiazem     fatigue   Gabapentin     Too sedated   Lipitor [Atorvastatin] Other (See Comments)    Muscle cramps   Methotrexate      Swelling & Nausea   Miralax [Polyethylene Glycol]     bloating   Nabumetone Other (See Comments)    Unsure of reaction (reaction occurred sometime ago)   Plaquenil [Hydroxychloroquine]     Weak, blurred vision   Pravastatin     Weak legs, arthralgias   Sarilumab      increased cholesterol  Kevsarsa*   Senna Other (See Comments)    Causes bleeding from the anus   Statins Other (See Comments)    Muscle aches and cramps    Social History   Tobacco Use   Smoking status: Former    Packs/day: 0.50    Years: 58.00    Total pack years: 29.00    Types: Cigarettes    Quit date: 02/15/2018    Years since quitting: 4.2   Smokeless tobacco: Never  Substance Use Topics   Alcohol use: No    Family History  Problem Relation Age of Onset   Heart disease Mother 91       CHF   COPD Father 59       emphesema     Review of Systems  Positive ROS: neg  All other systems have been reviewed and were otherwise negative with the exception of those  mentioned in the HPI and as above.  Objective: Vital signs in last 24 hours: Temp:  [97.9 F (36.6 C)] 97.9 F (36.6 C) (12/13 0809) Pulse Rate:  [75] 75 (12/13 0809) Resp:  [18] 18 (12/13 0809) BP: (154)/(63) 154/63 (12/13 0809) SpO2:  [96 %] 96 % (12/13 0809) Weight:  [64.9 kg] 64.9 kg (12/13 0809)  General Appearance: Alert, cooperative, no distress, appears stated age Head: Normocephalic, without obvious abnormality, atraumatic Eyes: PERRL, conjunctiva/corneas clear, EOM's intact    Neck: Supple, symmetrical, trachea midline Back: Symmetric, no curvature, ROM normal, no CVA tenderness Lungs:  respirations unlabored Heart: Regular rate and rhythm Abdomen: Soft, non-tender Extremities: Extremities normal, atraumatic, no cyanosis or edema Pulses: 2+  and symmetric all extremities Skin: Skin color, texture, turgor normal, no rashes or lesions  NEUROLOGIC:   Mental status: Alert and oriented x4,  no aphasia, good attention span, fund of knowledge, and memory Motor Exam - grossly normal Sensory Exam - grossly normal Reflexes: 1+ Coordination - grossly normal Gait - grossly normal Balance - grossly normal Cranial Nerves: I: smell Not tested  II: visual acuity  OS: nl    OD: nl  II: visual fields Full to confrontation  II: pupils Equal, round, reactive to light  III,VII: ptosis None  III,IV,VI: extraocular muscles  Full ROM  V: mastication Normal  V: facial light touch sensation  Normal  V,VII: corneal reflex  Present  VII: facial muscle function - upper  Normal  VII: facial muscle function - lower Normal  VIII: hearing Not tested  IX: soft palate elevation  Normal  IX,X: gag reflex Present  XI: trapezius strength  5/5  XI: sternocleidomastoid strength 5/5  XI: neck flexion strength  5/5  XII: tongue strength  Normal    Data Review Lab Results  Component Value Date   WBC 11.8 (H) 05/22/2022   HGB 14.3 05/22/2022   HCT 43.2 05/22/2022   MCV 88.0 05/22/2022    PLT 265 05/22/2022   Lab Results  Component Value Date   NA 137 05/22/2022   K 3.7 05/22/2022   CL 102 05/22/2022   CO2 28 05/22/2022   BUN 14 05/22/2022   CREATININE 0.70 05/22/2022   GLUCOSE 99 05/22/2022   Lab Results  Component Value Date   INR 1.0 05/22/2022    Assessment/Plan:  Estimated body mass index is 27.93 kg/m as calculated from the following:   Height as of this encounter: 5' (1.524 m).   Weight as of this encounter: 64.9 kg. Patient admitted for PLIF L4-5. Patient has failed a reasonable attempt at conservative therapy.  I explained the condition and procedure to the patient and answered any questions.  Patient wishes to proceed with procedure as planned. Understands risks/ benefits and typical outcomes of procedure.   Debra Farmer 05/27/2022 9:50 AM

## 2022-05-27 NOTE — Transfer of Care (Signed)
Immediate Anesthesia Transfer of Care Note  Patient: Debra Farmer  Procedure(s) Performed: Posterior Lumbar Interbody Fusion - Lumbar Four- Lumbar Five (Back)  Patient Location: PACU  Anesthesia Type:General  Level of Consciousness: drowsy  Airway & Oxygen Therapy: Patient Spontanous Breathing and Patient connected to face mask oxygen  Post-op Assessment: Report given to RN and Post -op Vital signs reviewed and stable  Post vital signs: Reviewed and stable  Last Vitals:  Vitals Value Taken Time  BP 131/81 05/27/22 1351  Temp 36.6 C 05/27/22 1350  Pulse 74 05/27/22 1358  Resp 12 05/27/22 1358  SpO2 94 % 05/27/22 1358  Vitals shown include unvalidated device data.  Last Pain:  Vitals:   05/27/22 1350  TempSrc:   PainSc: 9       Patients Stated Pain Goal: 0 (23/55/73 2202)  Complications: No notable events documented.

## 2022-05-28 DIAGNOSIS — M48061 Spinal stenosis, lumbar region without neurogenic claudication: Secondary | ICD-10-CM | POA: Diagnosis not present

## 2022-05-28 MED ORDER — OXYCODONE HCL 5 MG PO TABS: 5.0000 mg | ORAL_TABLET | ORAL | 0 refills | Status: DC | PRN

## 2022-05-28 MED ORDER — METHOCARBAMOL 500 MG PO TABS: 500.0000 mg | ORAL_TABLET | Freq: Four times a day (QID) | ORAL | 1 refills | Status: DC | PRN

## 2022-05-28 NOTE — Evaluation (Signed)
Occupational Therapy Evaluation Patient Details Name: Debra Farmer MRN: [id number] DOB: December 04, 1945 Today's Date: 05/28/2022   History of Present Illness 76 yo F adm 12/13 for PLIF.  PMH includes: COPD, GERD, prior ACDF.   Clinical Impression   Patient admitted for the scheduled surgery.  PTA she continues to remain active, needing no assist with ADL or mobility.  Patient's spouse will be staying home for a few days to assist as needed, but she should return to baseline quickly.  All precautions reviewed, and all questions answered.  No further therapy needs in the acute setting.  Recommended follow as prescribed by MD.        Recommendations for follow up therapy are one component of a multi-disciplinary discharge planning process, led by the attending physician.  Recommendations may be updated based on patient status, additional functional criteria and insurance authorization.   Follow Up Recommendations  No OT follow up     Assistance Recommended at Discharge PRN  Patient can return home with the following Assist for transportation    Functional Status Assessment  Patient has not had a recent decline in their functional status  Equipment Recommendations  None recommended by OT    Recommendations for Other Services       Precautions / Restrictions Precautions Precautions: Back Precaution Booklet Issued: Yes (comment) Required Braces or Orthoses: Spinal Brace Spinal Brace: Lumbar corset Restrictions Weight Bearing Restrictions: No      Mobility Bed Mobility Overal bed mobility: Modified Independent                  Transfers Overall transfer level: Modified independent                        Balance Overall balance assessment: Mild deficits observed, not formally tested                                         ADL either performed or assessed with clinical judgement   ADL Overall ADL's : At baseline                                              Vision Patient Visual Report: No change from baseline       Perception     Praxis      Pertinent Vitals/Pain Pain Assessment Pain Assessment: Faces Faces Pain Scale: Hurts a little bit Pain Location: Incisional Pain Descriptors / Indicators: Tender Pain Intervention(s): Monitored during session     Hand Dominance Right   Extremity/Trunk Assessment Upper Extremity Assessment Upper Extremity Assessment: Overall WFL for tasks assessed   Lower Extremity Assessment Lower Extremity Assessment: Overall WFL for tasks assessed   Cervical / Trunk Assessment Cervical / Trunk Assessment: Back Surgery   Communication Communication Communication: No difficulties   Cognition Arousal/Alertness: Awake/alert Behavior During Therapy: WFL for tasks assessed/performed Overall Cognitive Status: Within Functional Limits for tasks assessed                                       General Comments   VSS on RA    Exercises     Shoulder Instructions  Home Living Family/patient expects to be discharged to:: Private residence Living Arrangements: Spouse/significant other Available Help at Discharge: Family;Available PRN/intermittently Type of Home: House Home Access: Stairs to enter [employer] of Steps: 3   Home Layout: One level     Bathroom Shower/Tub: Occupational psychologist: Standard Bathroom Accessibility: Yes How Accessible: Accessible via walker Home Equipment: Shower seat - built in;Grab bars - tub/shower          Prior Functioning/Environment Prior Level of Function : Independent/Modified Independent;Driving                        OT Problem List: Pain      OT Treatment/Interventions:      OT Goals(Current goals can be found in the care plan section) Acute Rehab OT Goals Patient Stated Goal: Going home OT Goal Formulation: With patient Time For Goal Achievement:  06/01/22 Potential to Achieve Goals: Good  OT Frequency: Min 2X/week    Co-evaluation              AM-PAC OT "6 Clicks" Daily Activity     Outcome Measure Help from another person eating meals?: None Help from another person taking care of personal grooming?: None Help from another person toileting, which includes using toliet, bedpan, or urinal?: None Help from another person bathing (including washing, rinsing, drying)?: None Help from another person to put on and taking off regular upper body clothing?: None Help from another person to put on and taking off regular lower body clothing?: None 6 Click Score: 24   End of Session Equipment Utilized During Treatment: Back brace Nurse Communication: Mobility status;Precautions  Activity Tolerance: Patient tolerated treatment well Patient left: in chair;with call bell/phone within reach  OT Visit Diagnosis: Unsteadiness on feet (R26.81)                Time: 5573-2202 OT Time Calculation (min): 18 min Charges:  OT General Charges $OT Visit: 1 Visit OT Evaluation $OT Eval Moderate Complexity: 1 Mod  05/28/2022  RP, OTR/L  Acute Rehabilitation Services  Office:  [phone]   Metta Clines 05/28/2022, 8:31 AM

## 2022-05-28 NOTE — Discharge Summary (Signed)
Physician Discharge Summary  Patient ID: Debra Farmer MRN: [id number] DOB/AGE: 1945/08/13 76 y.o.  Admit date: 05/27/2022 Discharge date: 05/28/2022  Admission Diagnoses: lumbar radiculopathy    Discharge Diagnoses: same   Discharged Condition: good  Hospital Course: The patient was admitted on 05/27/2022 and taken to the operating room where the patient underwent PLIF L4-5. The patient tolerated the procedure well and was taken to the recovery room and then to the flor in stable condition. The hospital course was routine. There were no complications. The wound remained clean dry and intact. Pt had appropriate back soreness. No complaints of leg pain or new N/T/W. The patient remained afebrile with stable vital signs, and tolerated a regular diet. The patient continued to increase activities, and pain was well controlled with oral pain medications.   Consults: None  Significant Diagnostic Studies:  Results for orders placed or performed during the hospital encounter of 05/22/22  Surgical pcr screen   Specimen: Nasal Mucosa; Nasal Swab  Result Value Ref Range   MRSA, PCR NEGATIVE NEGATIVE   Staphylococcus aureus POSITIVE (A) NEGATIVE  Protime-INR  Result Value Ref Range   Prothrombin Time 12.7 11.4 - 15.2 seconds   INR 1.0 0.8 - 1.2  Basic metabolic panel per protocol  Result Value Ref Range   Sodium 137 135 - 145 mmol/L   Potassium 3.7 3.5 - 5.1 mmol/L   Chloride 102 98 - 111 mmol/L   CO2 28 22 - 32 mmol/L   Glucose, Bld 99 70 - 99 mg/dL   BUN 14 8 - 23 mg/dL   Creatinine, Ser 0.70 0.44 - 1.00 mg/dL   Calcium 9.3 8.9 - 10.3 mg/dL   GFR, Estimated >60 >60 mL/min   Anion gap 7 5 - 15  CBC per protocol  Result Value Ref Range   WBC 11.8 (H) 4.0 - 10.5 K/uL   RBC 4.91 3.87 - 5.11 MIL/uL   Hemoglobin 14.3 12.0 - 15.0 g/dL   HCT 43.2 36.0 - 46.0 %   MCV 88.0 80.0 - 100.0 fL   MCH 29.1 26.0 - 34.0 pg   MCHC 33.1 30.0 - 36.0 g/dL   RDW 13.2 11.5 - 15.5 %    Platelets 265 150 - 400 K/uL   nRBC 0.0 0.0 - 0.2 %  Type and screen [place]  Result Value Ref Range   ABO/RH(D) O POS    Antibody Screen NEG    Sample Expiration 06/05/2022,2359    Extend sample reason      NO TRANSFUSIONS OR PREGNANCY IN THE PAST 3 MONTHS Performed at [facility], [address] [place], [place] [address]     DG Lumbar Spine 2-3 Views  Result Date: 05/27/2022 CLINICAL DATA:  Fluoroscopic assistance for lumbar spine fusion EXAM: LUMBAR SPINE - 2-3 VIEW COMPARISON:  CT done on 03/19/2022 FINDINGS: Fluoroscopic images show surgical fusion at the L4-L5 level. Intervertebral disc spacer is noted in place. Fluoroscopic time was 69 seconds. Radiation dose 52.67 mGy. IMPRESSION: Fluoroscopic assistance was provided for surgical fusion at L4-L5 level. Electronically Signed   By: Elmer Picker M.D.   On: 05/27/2022 13:44   DG C-Arm 1-60 Min-No Report  Result Date: 05/27/2022 Fluoroscopy was utilized by the requesting physician.  No radiographic interpretation.   DG C-Arm 1-60 Min-No Report  Result Date: 05/27/2022 Fluoroscopy was utilized by the requesting physician.  No radiographic interpretation.   DG C-Arm 1-60 Min-No Report  Result Date: 05/27/2022 Fluoroscopy was utilized by  the requesting physician.  No radiographic interpretation.    Antibiotics:  Anti-infectives (From admission, onward)    Start     Dose/Rate Route Frequency Ordered Stop   05/27/22 1800  ceFAZolin (ANCEF) IVPB 2g/100 mL premix        2 g 200 mL/hr over 30 Minutes Intravenous Every 8 hours 05/27/22 1532 05/28/22 0226   05/27/22 0815  ceFAZolin (ANCEF) IVPB 2g/100 mL premix        2 g 200 mL/hr over 30 Minutes Intravenous On call to O.R. 05/27/22 0806 05/27/22 1029   05/27/22 0718  vancomycin (VANCOCIN) 1-5 GM/200ML-% IVPB       Note to Pharmacy: Wendall Mola M: cabinet override      05/27/22 0718 05/27/22 1929       Discharge Exam: Blood pressure  101/70, pulse 66, temperature 97.9 F (36.6 C), temperature source Oral, resp. rate 18, height 5' (1.524 m), weight 64.9 kg, SpO2 97 %. Neurologic: Grossly normal Dressing dry  Discharge Medications:   Allergies as of 05/28/2022       Reactions   Aspirin Other (See Comments)   MUST HAVE COATED ASA   Codeine Sulfate Other (See Comments)   "drugged out feeling" & hallucinations   Diltiazem    fatigue   Gabapentin    Too sedated   Lipitor [atorvastatin] Other (See Comments)   Muscle cramps   Methotrexate     Swelling & Nausea   Miralax [polyethylene Glycol]    bloating   Nabumetone Other (See Comments)   Unsure of reaction (reaction occurred sometime ago)   Plaquenil [hydroxychloroquine]    Weak, blurred vision   Pravastatin    Weak legs, arthralgias   Sarilumab     increased cholesterol Kevsarsa*   Senna Other (See Comments)   Causes bleeding from the anus   Statins Other (See Comments)   Muscle aches and cramps        Medication List     STOP taking these medications    traMADol 50 MG tablet Commonly known as: ULTRAM       TAKE these medications    aspirin EC 81 MG tablet Take 1 tablet (81 mg total) by mouth daily. Swallow whole.   Enbrel SureClick 50 MG/ML injection Generic drug: etanercept Inject 50 mg into the skin every Thursday.   furosemide 20 MG tablet Commonly known as: LASIX TAKE 1-2 TABLETS BY MOUTH DAILY AS NEEDED FOR EDEMA   leflunomide 20 MG tablet Commonly known as: ARAVA Take 20 mg by mouth daily.   linaclotide 290 MCG Caps capsule Commonly known as: LINZESS Take 1 capsule (290 mcg total) by mouth daily. What changed:  when to take this reasons to take this   methocarbamol 500 MG tablet Commonly known as: ROBAXIN Take 1 tablet (500 mg total) by mouth every 6 (six) hours as needed for muscle spasms.   multivitamin with minerals tablet Take 1 tablet by mouth daily. Immune Vitamin   nitroGLYCERIN 0.4 MG SL tablet Commonly  known as: NITROSTAT Place 1 tablet (0.4 mg total) under the tongue every 5 (five) minutes x 3 doses as needed for chest pain.   oxyCODONE 5 MG immediate release tablet Commonly known as: Oxy IR/ROXICODONE Take 1 tablet (5 mg total) by mouth every 3 (three) hours as needed for severe pain ((score 7 to 10)).   potassium chloride 10 MEQ tablet Commonly known as: KLOR-CON M Take 1 tablet (10 mEq total) by mouth daily. What changed:  when to  take this reasons to take this   predniSONE 1 MG tablet Commonly known as: DELTASONE Take 4 mg by mouth daily with breakfast.   Vitamin B-12 5000 MCG Tbdp Take 5,000 mcg by mouth daily.   Vitamin D3 125 MCG (5000 UT) Tabs Take 5,000 Units by mouth in the morning and at bedtime. WITH A GLASS OF VANILLA-HONEY ALMOND MILK   zolpidem 10 MG tablet Commonly known as: AMBIEN TAKE 1 TABLET BY MOUTH EACH NIGHT AT BEDTIME FOR SLEEP What changed:  how much to take how to take this when to take this reasons to take this additional instructions               Durable Medical Equipment  (From admission, onward)           Start     Ordered   05/27/22 1546  DME Walker rolling  Once       Question:  Patient needs a walker to treat with the following condition  Answer:  S/P lumbar fusion   05/27/22 1546   05/27/22 1546  DME 3 n 1  Once        05/27/22 1546            Disposition: home   Final Dx: PLIF L4-5  Discharge Instructions      Remove dressing in 72 hours   Complete by: As directed    Call MD for:  difficulty breathing, headache or visual disturbances   Complete by: As directed    Call MD for:  persistant nausea and vomiting   Complete by: As directed    Call MD for:  redness, tenderness, or signs of infection (pain, swelling, redness, odor or green/yellow discharge around incision site)   Complete by: As directed    Call MD for:  severe uncontrolled pain   Complete by: As directed    Call MD for:  temperature >100.4    Complete by: As directed    Diet - low sodium heart healthy   Complete by: As directed    Discharge instructions   Complete by: As directed    No driving, may shower, no lifting more than 8 lbs, no repetitive bending   Increase activity slowly   Complete by: As directed         Follow-up Information     Eustace Moore, MD. Schedule an appointment as soon as possible for a visit in 2 week(s).   Specialty: Neurosurgery Contact information: [address] [place] [address] [phone]                  Signed: Eustace Moore 05/28/2022, 7:49 AM

## 2022-05-28 NOTE — Progress Notes (Signed)
PT Cancellation Note and Discharge  Patient Details Name: Debra Farmer MRN: [id number] DOB: Jan 04, 1946   Cancelled Treatment:    Reason Eval/Treat Not Completed: PT screened, no needs identified, will sign off. Discussed pt case with OT who reports pt is currently mobilizing at a modified independent level and does not require a formal PT evaluation at this time. PT signing off. If needs change, please reconsult.     Thelma Comp 05/28/2022, 10:34 AM  Rolinda Roan, PT, DPT Acute Rehabilitation Services Secure Chat Preferred Office: [phone]

## 2022-06-25 ENCOUNTER — Other Ambulatory Visit: Payer: Self-pay | Admitting: Internal Medicine

## 2022-07-21 DIAGNOSIS — M5416 Radiculopathy, lumbar region: Secondary | ICD-10-CM | POA: Diagnosis not present

## 2022-07-29 ENCOUNTER — Ambulatory Visit (INDEPENDENT_AMBULATORY_CARE_PROVIDER_SITE_OTHER): Payer: PPO | Admitting: Internal Medicine

## 2022-07-29 ENCOUNTER — Encounter: Payer: Self-pay | Admitting: Internal Medicine

## 2022-07-29 VITALS — BP 110/60 | HR 60 | Temp 98.7°F | Ht 60.0 in | Wt 145.0 lb

## 2022-07-29 DIAGNOSIS — M48061 Spinal stenosis, lumbar region without neurogenic claudication: Secondary | ICD-10-CM

## 2022-07-29 DIAGNOSIS — F1920 Other psychoactive substance dependence, uncomplicated: Secondary | ICD-10-CM | POA: Diagnosis not present

## 2022-07-29 DIAGNOSIS — G4709 Other insomnia: Secondary | ICD-10-CM

## 2022-07-29 MED ORDER — PHENTERMINE HCL 37.5 MG PO CAPS: 37.5000 mg | ORAL_CAPSULE | ORAL | 2 refills | Status: DC

## 2022-07-29 NOTE — Progress Notes (Signed)
Subjective:  Patient ID: Debra Farmer, female    DOB: 04-06-1946  Age: 76 y.o. MRN: [id number]  CC: No chief complaint on file.   HPI Debra Farmer presents for LBP. S/p PLIF L4-5 by Dr. Ronnald Ramp. C/o joint pain, wt gain 20 lbs, insomnia  Outpatient Medications Prior to Visit  Medication Sig Dispense Refill   aspirin EC 81 MG EC tablet Take 1 tablet (81 mg total) by mouth daily. Swallow whole. 30 tablet 11   Cholecalciferol (VITAMIN D3) 5000 units TABS Take 5,000 Units by mouth in the morning and at bedtime. WITH A GLASS OF VANILLA-HONEY ALMOND MILK     Cyanocobalamin (VITAMIN B-12) 5000 MCG TBDP Take 5,000 mcg by mouth daily.     etanercept (ENBREL SURECLICK) 50 MG/ML injection Inject 50 mg into the skin every Thursday.     furosemide (LASIX) 20 MG tablet TAKE 1-2 TABLETS BY MOUTH DAILY AS NEEDED FOR EDEMA 60 tablet 5   hydroxychloroquine (PLAQUENIL) 200 MG tablet      leflunomide (ARAVA) 20 MG tablet Take 20 mg by mouth daily.     linaclotide (LINZESS) 290 MCG CAPS capsule Take 1 capsule (290 mcg total) by mouth daily. (Patient taking differently: Take 290 mcg by mouth daily as needed (constipation).) 30 capsule 11   methocarbamol (ROBAXIN) 500 MG tablet Take 1 tablet (500 mg total) by mouth every 6 (six) hours as needed for muscle spasms. 40 tablet 1   Multiple Vitamins-Minerals (MULTIVITAMIN WITH MINERALS) tablet Take 1 tablet by mouth daily. Immune Vitamin     nitroGLYCERIN (NITROSTAT) 0.4 MG SL tablet Place 1 tablet (0.4 mg total) under the tongue every 5 (five) minutes x 3 doses as needed for chest pain. 25 tablet 1   oxyCODONE (OXY IR/ROXICODONE) 5 MG immediate release tablet Take 1 tablet (5 mg total) by mouth every 3 (three) hours as needed for severe pain ((score 7 to 10)). 25 tablet 0   potassium chloride (KLOR-CON) 10 MEQ tablet Take 1 tablet (10 mEq total) by mouth daily. (Patient taking differently: Take 10 mEq by mouth daily as needed (Takes with Lasix).) 90 tablet 3    predniSONE (DELTASONE) 1 MG tablet Take 4 mg by mouth daily with breakfast.     traMADol (ULTRAM) 50 MG tablet TAKE 1 TABLET BY MOUTH EVERY 6 HOURS 120 tablet 3   zolpidem (AMBIEN) 10 MG tablet TAKE 1 TABLET BY MOUTH EACH NIGHT AT BEDTIME FOR SLEEP (Patient taking differently: Take 10 mg by mouth at bedtime as needed for sleep.) 90 tablet 1   No facility-administered medications prior to visit.    ROS: Review of Systems  Constitutional:  Negative for activity change, appetite change, chills, fatigue and unexpected weight change.  HENT:  Negative for congestion, mouth sores and sinus pressure.   Eyes:  Negative for visual disturbance.  Respiratory:  Negative for cough and chest tightness.   Gastrointestinal:  Negative for abdominal pain and nausea.  Genitourinary:  Negative for difficulty urinating, frequency and vaginal pain.  Musculoskeletal:  Positive for arthralgias, back pain and joint swelling. Negative for gait problem.  Skin:  Negative for pallor and rash.  Neurological:  Negative for dizziness, tremors, weakness, numbness and headaches.  Psychiatric/Behavioral:  Negative for confusion and sleep disturbance. The patient is not nervous/anxious.     Objective:  BP 110/60 (BP Location: Right Arm, Patient Position: Sitting, Cuff Size: Normal)   Pulse 60   Temp 98.7 F (37.1 C) (Oral)   Ht 5' (1.524  m)   Wt 145 lb (65.8 kg)   SpO2 95%   BMI 28.32 kg/m   BP Readings from Last 3 Encounters:  07/29/22 110/60  05/28/22 (!) 131/49  05/22/22 (!) 142/70    Wt Readings from Last 3 Encounters:  07/29/22 145 lb (65.8 kg)  05/27/22 143 lb (64.9 kg)  05/22/22 144 lb 12.8 oz (65.7 kg)    Physical Exam Constitutional:      General: She is not in acute distress.    Appearance: She is well-developed. She is obese.  HENT:     Head: Normocephalic.     Right Ear: External ear normal.     Left Ear: External ear normal.     Nose: Nose normal.     Mouth/Throat:     Pharynx: No  posterior oropharyngeal erythema.  Eyes:     General:        Right eye: No discharge.        Left eye: No discharge.     Conjunctiva/sclera: Conjunctivae normal.     Pupils: Pupils are equal, round, and reactive to light.  Neck:     Thyroid: No thyromegaly.     Vascular: No JVD.     Trachea: No tracheal deviation.  Cardiovascular:     Rate and Rhythm: Normal rate and regular rhythm.     Heart sounds: Normal heart sounds.  Pulmonary:     Effort: No respiratory distress.     Breath sounds: No stridor. No wheezing.  Abdominal:     General: Bowel sounds are normal. There is no distension.     Palpations: Abdomen is soft. There is no mass.     Tenderness: There is no abdominal tenderness. There is no guarding or rebound.  Musculoskeletal:        General: Tenderness present.     Cervical back: Normal range of motion and neck supple. No rigidity.     Right lower leg: Edema present.     Left lower leg: Edema present.  Lymphadenopathy:     Cervical: No cervical adenopathy.  Skin:    Findings: No erythema or rash.  Neurological:     Mental Status: She is oriented to person, place, and time.     Cranial Nerves: Cranial nerve deficit present.     Motor: No abnormal muscle tone.     Coordination: Coordination normal.     Deep Tendon Reflexes: Reflexes normal.  Psychiatric:        Behavior: Behavior normal.        Thought Content: Thought content normal.        Judgment: Judgment normal.   Trace edema  LS spine with a scar; painful on range of motion  Lab Results  Component Value Date   WBC 11.8 (H) 05/22/2022   HGB 14.3 05/22/2022   HCT 43.2 05/22/2022   PLT 265 05/22/2022   GLUCOSE 99 05/22/2022   CHOL 275 (H) 07/20/2020   TRIG 321 (H) 07/20/2020   HDL 40 (L) 07/20/2020   LDLDIRECT 170.0 12/05/2014   LDLCALC 171 (H) 07/20/2020   ALT 31 07/19/2020   AST 26 07/19/2020   NA 137 05/22/2022   K 3.7 05/22/2022   CL 102 05/22/2022   CREATININE 0.70 05/22/2022   BUN 14  05/22/2022   CO2 28 05/22/2022   TSH 1.83 01/23/2020   INR 1.0 05/22/2022   HGBA1C 5.9 (H) 07/20/2020    DG Lumbar Spine 2-3 Views  Result Date: 05/27/2022 CLINICAL DATA:  Fluoroscopic  assistance for lumbar spine fusion EXAM: LUMBAR SPINE - 2-3 VIEW COMPARISON:  CT done on 03/19/2022 FINDINGS: Fluoroscopic images show surgical fusion at the L4-L5 level. Intervertebral disc spacer is noted in place. Fluoroscopic time was 69 seconds. Radiation dose 52.67 mGy. IMPRESSION: Fluoroscopic assistance was provided for surgical fusion at L4-L5 level. Electronically Signed   By: Elmer Picker M.D.   On: 05/27/2022 13:44   DG C-Arm 1-60 Min-No Report  Result Date: 05/27/2022 Fluoroscopy was utilized by the requesting physician.  No radiographic interpretation.   DG C-Arm 1-60 Min-No Report  Result Date: 05/27/2022 Fluoroscopy was utilized by the requesting physician.  No radiographic interpretation.   DG C-Arm 1-60 Min-No Report  Result Date: 05/27/2022 Fluoroscopy was utilized by the requesting physician.  No radiographic interpretation.    Assessment & Plan:   Problem List Items Addressed This Visit       Other   Steroid dependence ([place]) - Primary    On prednisone for rheumatoid arthritis      Spinal stenosis at L4-L5 level    Tramadol prn  Potential benefits of a long term opioids use as well as potential risks (i.e. addiction risk, apnea etc) and complications (i.e. Somnolence, constipation and others) were explained to the patient and were aknowledged.  S/p PLIF L4-5.        Insomnia    Chronic  Zolpidem prn  Potential benefits of a long term benzodiazepines  use as well as potential risks  and complications were explained to the patient and were aknowledged.         Meds ordered this encounter  Medications   phentermine 37.5 MG capsule    Sig: Take 1 capsule (37.5 mg total) by mouth every morning.    Dispense:  30 capsule    Refill:  2      Follow-up:  Return in about 3 months (around 10/27/2022) for a follow-up visit.  Walker Kehr, MD

## 2022-08-04 NOTE — Assessment & Plan Note (Signed)
On prednisone for rheumatoid arthritis

## 2022-08-04 NOTE — Assessment & Plan Note (Signed)
Tramadol prn  Potential benefits of a long term opioids use as well as potential risks (i.e. addiction risk, apnea etc) and complications (i.e. Somnolence, constipation and others) were explained to the patient and were aknowledged.  S/p PLIF L4-5.

## 2022-08-04 NOTE — Assessment & Plan Note (Signed)
Chronic  Zolpidem prn  Potential benefits of a long term benzodiazepines  use as well as potential risks  and complications were explained to the patient and were aknowledged.

## 2022-08-18 ENCOUNTER — Telehealth: Payer: Self-pay | Admitting: Internal Medicine

## 2022-08-18 NOTE — Telephone Encounter (Signed)
Patient called wanting to know if Handicap Sticker paperwork could be sent to her due to her just recently having back surgery and it is hard for her to come in the office. Best callback number for patient is [phone].

## 2022-08-18 NOTE — Telephone Encounter (Signed)
Called pt inform MD is out of the office until Friday, but once he sign will mail to her address. She states that would be fine. Place Handi capp form in MD purple folder.Marland KitchenJohny Farmer

## 2022-08-24 NOTE — Telephone Encounter (Signed)
Mailed handi-capp form to pt address.Marland KitchenJohny Farmer

## 2022-08-24 NOTE — Telephone Encounter (Signed)
Okay. Thank you.

## 2022-09-03 DIAGNOSIS — G5603 Carpal tunnel syndrome, bilateral upper limbs: Secondary | ICD-10-CM | POA: Diagnosis not present

## 2022-09-03 DIAGNOSIS — G5601 Carpal tunnel syndrome, right upper limb: Secondary | ICD-10-CM | POA: Diagnosis not present

## 2022-09-03 DIAGNOSIS — G5602 Carpal tunnel syndrome, left upper limb: Secondary | ICD-10-CM | POA: Diagnosis not present

## 2022-09-03 DIAGNOSIS — M545 Low back pain, unspecified: Secondary | ICD-10-CM | POA: Diagnosis not present

## 2022-09-08 DIAGNOSIS — M1991 Primary osteoarthritis, unspecified site: Secondary | ICD-10-CM | POA: Diagnosis not present

## 2022-09-08 DIAGNOSIS — Z79899 Other long term (current) drug therapy: Secondary | ICD-10-CM | POA: Diagnosis not present

## 2022-09-08 DIAGNOSIS — Z6827 Body mass index (BMI) 27.0-27.9, adult: Secondary | ICD-10-CM | POA: Diagnosis not present

## 2022-09-08 DIAGNOSIS — M545 Low back pain, unspecified: Secondary | ICD-10-CM | POA: Diagnosis not present

## 2022-09-08 DIAGNOSIS — R5382 Chronic fatigue, unspecified: Secondary | ICD-10-CM | POA: Diagnosis not present

## 2022-09-08 DIAGNOSIS — E663 Overweight: Secondary | ICD-10-CM | POA: Diagnosis not present

## 2022-09-08 DIAGNOSIS — M0609 Rheumatoid arthritis without rheumatoid factor, multiple sites: Secondary | ICD-10-CM | POA: Diagnosis not present

## 2022-10-05 ENCOUNTER — Ambulatory Visit (INDEPENDENT_AMBULATORY_CARE_PROVIDER_SITE_OTHER): Payer: PPO | Admitting: Internal Medicine

## 2022-10-05 ENCOUNTER — Encounter: Payer: Self-pay | Admitting: Internal Medicine

## 2022-10-05 VITALS — BP 140/80 | HR 98 | Temp 98.1°F | Ht 60.0 in | Wt 141.0 lb

## 2022-10-05 DIAGNOSIS — R768 Other specified abnormal immunological findings in serum: Secondary | ICD-10-CM | POA: Insufficient documentation

## 2022-10-05 DIAGNOSIS — M7062 Trochanteric bursitis, left hip: Secondary | ICD-10-CM

## 2022-10-05 DIAGNOSIS — G720 Drug-induced myopathy: Secondary | ICD-10-CM | POA: Diagnosis not present

## 2022-10-05 DIAGNOSIS — M0600 Rheumatoid arthritis without rheumatoid factor, unspecified site: Secondary | ICD-10-CM

## 2022-10-05 DIAGNOSIS — I2510 Atherosclerotic heart disease of native coronary artery without angina pectoris: Secondary | ICD-10-CM | POA: Diagnosis not present

## 2022-10-05 DIAGNOSIS — T466X5A Adverse effect of antihyperlipidemic and antiarteriosclerotic drugs, initial encounter: Secondary | ICD-10-CM

## 2022-10-05 MED ORDER — TRAMADOL HCL 50 MG PO TABS: 50.0000 mg | ORAL_TABLET | Freq: Four times a day (QID) | ORAL | 3 refills | Status: DC

## 2022-10-05 MED ORDER — ZOLPIDEM TARTRATE 10 MG PO TABS: ORAL_TABLET | ORAL | 1 refills | Status: DC

## 2022-10-05 NOTE — Assessment & Plan Note (Signed)
Blue-Emu cream was recommended to use 2-3 times a day ? ?

## 2022-10-05 NOTE — Assessment & Plan Note (Signed)
Pt had Hep A IgM (+) test on 09/08/22 w/Rheumatology for Enbrel No sx's Repeat the test

## 2022-10-05 NOTE — Progress Notes (Signed)
Subjective:  Patient ID: Debra Farmer, female    DOB: [birth year]  Age: 77 y.o. MRN: [id number]  CC: No chief complaint on file.   HPI Debra Farmer presents for RA, fatigue Pt had Hep A IgM (+) test on 09/08/22 w/Rheumatology for Enbrel  Outpatient Medications Prior to Visit  Medication Sig Dispense Refill   aspirin EC 81 MG EC tablet Take 1 tablet (81 mg total) by mouth daily. Swallow whole. 30 tablet 11   Cholecalciferol (VITAMIN D3) 5000 units TABS Take 5,000 Units by mouth in the morning and at bedtime. WITH A GLASS OF VANILLA-HONEY ALMOND MILK     Cyanocobalamin (VITAMIN B-12) 5000 MCG TBDP Take 5,000 mcg by mouth daily.     etanercept (ENBREL SURECLICK) 50 MG/ML injection Inject 50 mg into the skin every Thursday.     furosemide (LASIX) 20 MG tablet TAKE 1-2 TABLETS BY MOUTH DAILY AS NEEDED FOR EDEMA 60 tablet 5   hydroxychloroquine (PLAQUENIL) 200 MG tablet      leflunomide (ARAVA) 20 MG tablet Take 20 mg by mouth daily.     linaclotide (LINZESS) 290 MCG CAPS capsule Take 1 capsule (290 mcg total) by mouth daily. (Patient taking differently: Take 290 mcg by mouth daily as needed (constipation).) 30 capsule 11   methocarbamol (ROBAXIN) 500 MG tablet Take 1 tablet (500 mg total) by mouth every 6 (six) hours as needed for muscle spasms. 40 tablet 1   Multiple Vitamins-Minerals (MULTIVITAMIN WITH MINERALS) tablet Take 1 tablet by mouth daily. Immune Vitamin     nitroGLYCERIN (NITROSTAT) 0.4 MG SL tablet Place 1 tablet (0.4 mg total) under the tongue every 5 (five) minutes x 3 doses as needed for chest pain. 25 tablet 1   oxyCODONE (OXY IR/ROXICODONE) 5 MG immediate release tablet Take 1 tablet (5 mg total) by mouth every 3 (three) hours as needed for severe pain ((score 7 to 10)). 25 tablet 0   phentermine 37.5 MG capsule Take 1 capsule (37.5 mg total) by mouth every morning. 30 capsule 2   potassium chloride (KLOR-CON) 10 MEQ tablet Take 1 tablet (10 mEq total) by mouth daily.  (Patient taking differently: Take 10 mEq by mouth daily as needed (Takes with Lasix).) 90 tablet 3   predniSONE (DELTASONE) 1 MG tablet Take 4 mg by mouth daily with breakfast.     traMADol (ULTRAM) 50 MG tablet TAKE 1 TABLET BY MOUTH EVERY 6 HOURS 120 tablet 3   zolpidem (AMBIEN) 10 MG tablet TAKE 1 TABLET BY MOUTH EACH NIGHT AT BEDTIME FOR SLEEP (Patient taking differently: Take 10 mg by mouth at bedtime as needed for sleep.) 90 tablet 1   No facility-administered medications prior to visit.    ROS: Review of Systems  Constitutional:  Positive for fatigue. Negative for activity change, appetite change, chills and unexpected weight change.  HENT:  Negative for congestion, mouth sores and sinus pressure.   Eyes:  Negative for visual disturbance.  Respiratory:  Negative for cough and chest tightness.   Gastrointestinal:  Negative for abdominal pain and nausea.  Genitourinary:  Negative for difficulty urinating, frequency and vaginal pain.  Musculoskeletal:  Positive for arthralgias and back pain. Negative for gait problem.  Skin:  Negative for pallor and rash.  Neurological:  Negative for dizziness, tremors, weakness, numbness and headaches.  Psychiatric/Behavioral:  Positive for sleep disturbance. Negative for confusion and suicidal ideas. The patient is not nervous/anxious.     Objective:  BP (!) 140/80 (BP Location: Left Arm,  Patient Position: Sitting, Cuff Size: Normal)   Pulse 98   Temp 98.1 F (36.7 C) (Oral)   Ht 5' (1.524 m)   Wt 141 lb (64 kg)   SpO2 92%   BMI 27.54 kg/m   BP Readings from Last 3 Encounters:  10/05/22 (!) 140/80  07/29/22 110/60  05/28/22 (!) 131/49    Wt Readings from Last 3 Encounters:  10/05/22 141 lb (64 kg)  07/29/22 145 lb (65.8 kg)  05/27/22 143 lb (64.9 kg)    Physical Exam Constitutional:      General: She is not in acute distress.    Appearance: She is well-developed.  HENT:     Head: Normocephalic.     Right Ear: External ear  normal.     Left Ear: External ear normal.     Nose: Nose normal.  Eyes:     General:        Right eye: No discharge.        Left eye: No discharge.     Conjunctiva/sclera: Conjunctivae normal.     Pupils: Pupils are equal, round, and reactive to light.  Neck:     Thyroid: No thyromegaly.     Vascular: No JVD.     Trachea: No tracheal deviation.  Cardiovascular:     Rate and Rhythm: Normal rate and regular rhythm.     Heart sounds: Normal heart sounds.  Pulmonary:     Effort: No respiratory distress.     Breath sounds: No stridor. No wheezing.  Abdominal:     General: Bowel sounds are normal. There is no distension.     Palpations: Abdomen is soft. There is no mass.     Tenderness: There is no abdominal tenderness. There is no guarding or rebound.  Musculoskeletal:        General: No tenderness.     Cervical back: Normal range of motion and neck supple. No rigidity.  Lymphadenopathy:     Cervical: No cervical adenopathy.  Skin:    Findings: No erythema or rash.  Neurological:     Cranial Nerves: No cranial nerve deficit.     Motor: No abnormal muscle tone.     Coordination: Coordination normal.     Deep Tendon Reflexes: Reflexes normal.  Psychiatric:        Behavior: Behavior normal.        Thought Content: Thought content normal.        Judgment: Judgment normal.   L hip pain  Lab Results  Component Value Date   WBC 11.8 (H) 05/22/2022   HGB 14.3 05/22/2022   HCT 43.2 05/22/2022   PLT 265 05/22/2022   GLUCOSE 85 10/05/2022   CHOL 275 (H) 07/20/2020   TRIG 321 (H) 07/20/2020   HDL 40 (L) 07/20/2020   LDLDIRECT 170.0 12/05/2014   LDLCALC 171 (H) 07/20/2020   ALT 16 10/05/2022   AST 29 10/05/2022   NA 135 10/05/2022   K 4.0 10/05/2022   CL 99 10/05/2022   CREATININE 0.76 10/05/2022   BUN 11 10/05/2022   CO2 28 10/05/2022   TSH 1.83 01/23/2020   INR 1.0 05/22/2022   HGBA1C 5.9 (H) 07/20/2020    DG Lumbar Spine 2-3 Views  Result Date:  05/27/2022 CLINICAL DATA:  Fluoroscopic assistance for lumbar spine fusion EXAM: LUMBAR SPINE - 2-3 VIEW COMPARISON:  CT done on 03/19/2022 FINDINGS: Fluoroscopic images show surgical fusion at the L4-L5 level. Intervertebral disc spacer is noted in place. Fluoroscopic time was 82  seconds. Radiation dose 52.67 mGy. IMPRESSION: Fluoroscopic assistance was provided for surgical fusion at L4-L5 level. Electronically Signed   By: Ernie Avena M.D.   On: 05/27/2022 13:44   DG C-Arm 1-60 Min-No Report  Result Date: 05/27/2022 Fluoroscopy was utilized by the requesting physician.  No radiographic interpretation.   DG C-Arm 1-60 Min-No Report  Result Date: 05/27/2022 Fluoroscopy was utilized by the requesting physician.  No radiographic interpretation.   DG C-Arm 1-60 Min-No Report  Result Date: 05/27/2022 Fluoroscopy was utilized by the requesting physician.  No radiographic interpretation.    Assessment & Plan:   Problem List Items Addressed This Visit     Seronegative rheumatoid arthritis (HCC)   Relevant Medications   traMADol (ULTRAM) 50 MG tablet   Other Relevant Orders   Hepatitis Panel ([facility]) (Completed)   Comprehensive metabolic panel (Completed)   Coronary atherosclerosis    On Brilinta, Coreg, Red Rice yeast      Statin myopathy    Off statins      Hepatitis A antibody positive - Primary    Pt had Hep A IgM (+) test on 09/08/22 w/Rheumatology for Enbrel No sx's Repeat the test      Relevant Orders   Hepatitis Panel ([facility]FL) (Completed)   Comprehensive metabolic panel (Completed)   Trochanteric bursitis of left hip    Blue-Emu cream was recommended to use 2-3 times a day          Meds ordered this encounter  Medications   traMADol (ULTRAM) 50 MG tablet    Sig: Take 1 tablet (50 mg total) by mouth every 6 (six) hours.    Dispense:  120 tablet    Refill:  3   zolpidem (AMBIEN) 10 MG tablet    Sig: TAKE 1 TABLET BY MOUTH EACH NIGHT AT BEDTIME FOR SLEEP     Dispense:  90 tablet    Refill:  1      Follow-up: No follow-ups on file.  Sonda Primes, MD

## 2022-10-05 NOTE — Assessment & Plan Note (Signed)
Off statins 

## 2022-10-05 NOTE — Assessment & Plan Note (Signed)
On Brilinta, Coreg, Red Rice yeast

## 2022-10-06 LAB — COMPREHENSIVE METABOLIC PANEL
ALT: 16 U/L (ref 0–35)
AST: 29 U/L (ref 0–37)
Albumin: 4.1 g/dL (ref 3.5–5.2)
Alkaline Phosphatase: 110 U/L (ref 39–117)
BUN: 11 mg/dL (ref 6–23)
CO2: 28 mEq/L (ref 19–32)
Calcium: 10.2 mg/dL (ref 8.4–10.5)
Chloride: 99 mEq/L (ref 96–112)
Creatinine, Ser: 0.76 mg/dL (ref 0.40–1.20)
GFR: 76.64 mL/min (ref >60.00)
Glucose, Bld: 85 mg/dL (ref 70–99)
Potassium: 4 mEq/L (ref 3.5–5.1)
Sodium: 135 mEq/L (ref 135–145)
Total Bilirubin: 0.4 mg/dL (ref 0.2–1.2)
Total Protein: 7.6 g/dL (ref 6.0–8.3)

## 2022-10-06 LAB — HEPATITIS PANEL(REFL)
HEPATITIS C ANTIBODY REFILL$(REFL): NONREACTIVE
Hep B Core Total Ab: NONREACTIVE
Hep B S Ab: NONREACTIVE
Hepatitis A AB,Total: NONREACTIVE
Hepatitis B Surface Ag: NONREACTIVE

## 2022-10-06 LAB — REFLEX TIQ

## 2022-10-12 DIAGNOSIS — M06252 Rheumatoid bursitis, left hip: Secondary | ICD-10-CM | POA: Diagnosis not present

## 2022-10-14 DIAGNOSIS — M7062 Trochanteric bursitis, left hip: Secondary | ICD-10-CM | POA: Diagnosis not present

## 2022-11-16 DIAGNOSIS — M7062 Trochanteric bursitis, left hip: Secondary | ICD-10-CM | POA: Diagnosis not present

## 2022-11-17 ENCOUNTER — Telehealth: Payer: Self-pay | Admitting: Internal Medicine

## 2022-11-17 NOTE — Telephone Encounter (Signed)
You must schedule a surgical clearance appt w/ MD. They will do EKG...lmb

## 2022-11-17 NOTE — Telephone Encounter (Signed)
Pt was requesting that she wanted a EKG done and also she is faxing in a form for surgery (Hip replacement) for Dr. Macario Golds to fill out.

## 2022-11-18 ENCOUNTER — Telehealth: Payer: Self-pay | Admitting: Internal Medicine

## 2022-11-18 NOTE — Telephone Encounter (Signed)
Patient dropped off document Surgical Clearance, to be filled out by provider. Patient requested to send it via Fax within 7-days. Document is located in providers tray at front office.Please advise at Mobile [phone] (mobile)   Please fax to: [phone]

## 2022-11-18 NOTE — Telephone Encounter (Signed)
Place form in MD purple folder to complete../lmb 

## 2022-11-19 NOTE — Telephone Encounter (Signed)
Faxed surgical clearance back to [facility] @ [phone].Marland KitchenRaechel Chute

## 2022-11-20 NOTE — Telephone Encounter (Signed)
It has been completed.  Thanks

## 2022-11-30 ENCOUNTER — Ambulatory Visit (INDEPENDENT_AMBULATORY_CARE_PROVIDER_SITE_OTHER): Payer: PPO | Admitting: Internal Medicine

## 2022-11-30 ENCOUNTER — Encounter: Payer: Self-pay | Admitting: Internal Medicine

## 2022-11-30 ENCOUNTER — Ambulatory Visit (INDEPENDENT_AMBULATORY_CARE_PROVIDER_SITE_OTHER): Payer: PPO

## 2022-11-30 VITALS — BP 130/64 | HR 85 | Temp 98.6°F | Ht 60.0 in | Wt 145.0 lb

## 2022-11-30 DIAGNOSIS — R609 Edema, unspecified: Secondary | ICD-10-CM

## 2022-11-30 DIAGNOSIS — E785 Hyperlipidemia, unspecified: Secondary | ICD-10-CM

## 2022-11-30 DIAGNOSIS — Z01818 Encounter for other preprocedural examination: Secondary | ICD-10-CM

## 2022-11-30 DIAGNOSIS — I2510 Atherosclerotic heart disease of native coronary artery without angina pectoris: Secondary | ICD-10-CM

## 2022-11-30 DIAGNOSIS — J449 Chronic obstructive pulmonary disease, unspecified: Secondary | ICD-10-CM | POA: Diagnosis not present

## 2022-11-30 DIAGNOSIS — I2583 Coronary atherosclerosis due to lipid rich plaque: Secondary | ICD-10-CM

## 2022-11-30 DIAGNOSIS — J439 Emphysema, unspecified: Secondary | ICD-10-CM | POA: Diagnosis not present

## 2022-11-30 DIAGNOSIS — M2550 Pain in unspecified joint: Secondary | ICD-10-CM | POA: Diagnosis not present

## 2022-11-30 LAB — PROTIME-INR
INR: 1 ratio (ref 0.8–1.0)
Prothrombin Time: 10.2 s (ref 9.6–13.1)

## 2022-11-30 LAB — COMPREHENSIVE METABOLIC PANEL
ALT: 15 U/L (ref 0–35)
AST: 20 U/L (ref 0–37)
Albumin: 4.1 g/dL (ref 3.5–5.2)
Alkaline Phosphatase: 87 U/L (ref 39–117)
BUN: 14 mg/dL (ref 6–23)
CO2: 27 mEq/L (ref 19–32)
Calcium: 9.8 mg/dL (ref 8.4–10.5)
Chloride: 99 mEq/L (ref 96–112)
Creatinine, Ser: 0.72 mg/dL (ref 0.40–1.20)
GFR: 81.69 mL/min (ref >60.00)
Glucose, Bld: 104 mg/dL — ABNORMAL HIGH (ref 70–99)
Potassium: 3.7 mEq/L (ref 3.5–5.1)
Sodium: 136 mEq/L (ref 135–145)
Total Bilirubin: 0.4 mg/dL (ref 0.2–1.2)
Total Protein: 7.9 g/dL (ref 6.0–8.3)

## 2022-11-30 LAB — CBC WITH DIFFERENTIAL/PLATELET
Basophils Absolute: 0 10*3/uL (ref 0.0–0.1)
Basophils Relative: 0.4 % (ref 0.0–3.0)
Eosinophils Absolute: 0.1 10*3/uL (ref 0.0–0.7)
Eosinophils Relative: 0.8 % (ref 0.0–5.0)
HCT: 43.4 % (ref 36.0–46.0)
Hemoglobin: 14.2 g/dL (ref 12.0–15.0)
Lymphocytes Relative: 20.8 % (ref 12.0–46.0)
Lymphs Abs: 1.9 10*3/uL (ref 0.7–4.0)
MCHC: 32.6 g/dL (ref 30.0–36.0)
MCV: 88 fl (ref 78.0–100.0)
Monocytes Absolute: 0.6 10*3/uL (ref 0.1–1.0)
Monocytes Relative: 6.6 % (ref 3.0–12.0)
Neutro Abs: 6.7 10*3/uL (ref 1.4–7.7)
Neutrophils Relative %: 71.4 % (ref 43.0–77.0)
Platelets: 314 10*3/uL (ref 150.0–400.0)
RBC: 4.94 Mil/uL (ref 3.87–5.11)
RDW: 14.3 % (ref 11.5–15.5)
WBC: 9.3 10*3/uL (ref 4.0–10.5)

## 2022-11-30 MED ORDER — POTASSIUM CHLORIDE CRYS ER 10 MEQ PO TBCR: 10.0000 meq | EXTENDED_RELEASE_TABLET | Freq: Every day | ORAL | 3 refills | Status: DC

## 2022-11-30 NOTE — Assessment & Plan Note (Signed)
Debra Farmer is clear for her hip surgery assuming her labs/CXR are acceptable. She has been on chronic steroids. She needs stress steroids peri-operatively. She would benefit from using inhalers perioperatively EKG today w/o acute changes. Thank you!

## 2022-11-30 NOTE — Progress Notes (Signed)
Subjective:  Patient ID: Debra Farmer, female    DOB: 1946/01/14  Age: 77 y.o. MRN: [id number]  CC: Medical Clearance (SURGICAL CLEARANCE)   HPI Debra Farmer presents for surgical clearance - L hip arthroplasty (Dr Linna Caprice) F/u on CAD, RA, HTN  Outpatient Medications Prior to Visit  Medication Sig Dispense Refill   aspirin EC 81 MG EC tablet Take 1 tablet (81 mg total) by mouth daily. Swallow whole. 30 tablet 11   Cholecalciferol (VITAMIN D3) 5000 units TABS Take 5,000 Units by mouth in the morning and at bedtime. WITH A GLASS OF VANILLA-HONEY ALMOND MILK     Cyanocobalamin (VITAMIN B-12) 5000 MCG TBDP Take 5,000 mcg by mouth daily.     etanercept (ENBREL SURECLICK) 50 MG/ML injection Inject 50 mg into the skin every Thursday.     furosemide (LASIX) 20 MG tablet TAKE 1-2 TABLETS BY MOUTH DAILY AS NEEDED FOR EDEMA 60 tablet 5   hydroxychloroquine (PLAQUENIL) 200 MG tablet      leflunomide (ARAVA) 20 MG tablet Take 20 mg by mouth daily.     linaclotide (LINZESS) 290 MCG CAPS capsule Take 1 capsule (290 mcg total) by mouth daily. (Patient taking differently: Take 290 mcg by mouth daily as needed (constipation).) 30 capsule 11   methocarbamol (ROBAXIN) 500 MG tablet Take 1 tablet (500 mg total) by mouth every 6 (six) hours as needed for muscle spasms. 40 tablet 1   Multiple Vitamins-Minerals (MULTIVITAMIN WITH MINERALS) tablet Take 1 tablet by mouth daily. Immune Vitamin     nitroGLYCERIN (NITROSTAT) 0.4 MG SL tablet Place 1 tablet (0.4 mg total) under the tongue every 5 (five) minutes x 3 doses as needed for chest pain. 25 tablet 1   oxyCODONE (OXY IR/ROXICODONE) 5 MG immediate release tablet Take 1 tablet (5 mg total) by mouth every 3 (three) hours as needed for severe pain ((score 7 to 10)). 25 tablet 0   phentermine 37.5 MG capsule Take 1 capsule (37.5 mg total) by mouth every morning. 30 capsule 2   predniSONE (DELTASONE) 1 MG tablet Take 4 mg by mouth daily with breakfast.      traMADol (ULTRAM) 50 MG tablet Take 1 tablet (50 mg total) by mouth every 6 (six) hours. 120 tablet 3   zolpidem (AMBIEN) 10 MG tablet TAKE 1 TABLET BY MOUTH EACH NIGHT AT BEDTIME FOR SLEEP 90 tablet 1   potassium chloride (KLOR-CON) 10 MEQ tablet Take 1 tablet (10 mEq total) by mouth daily. (Patient taking differently: Take 10 mEq by mouth daily as needed (Takes with Lasix).) 90 tablet 3   No facility-administered medications prior to visit.    ROS: Review of Systems  Constitutional:  Negative for activity change, appetite change, chills, fatigue and unexpected weight change.  HENT:  Negative for congestion, mouth sores and sinus pressure.   Eyes:  Negative for visual disturbance.  Respiratory:  Negative for cough and chest tightness.   Gastrointestinal:  Negative for abdominal pain and nausea.  Genitourinary:  Negative for difficulty urinating, frequency and vaginal pain.  Musculoskeletal:  Positive for arthralgias. Negative for back pain and gait problem.  Skin:  Negative for pallor and rash.  Neurological:  Negative for dizziness, tremors, weakness, numbness and headaches.  Psychiatric/Behavioral:  Negative for confusion and sleep disturbance.     Objective:  BP 130/64 (BP Location: Left Arm, Patient Position: Sitting, Cuff Size: Normal)   Pulse 85   Temp 98.6 F (37 C) (Oral)   Ht 5' (1.524 m)  Wt 145 lb (65.8 kg)   SpO2 97%   BMI 28.32 kg/m   BP Readings from Last 3 Encounters:  11/30/22 130/64  10/05/22 (!) 140/80  07/29/22 110/60    Wt Readings from Last 3 Encounters:  11/30/22 145 lb (65.8 kg)  10/05/22 141 lb (64 kg)  07/29/22 145 lb (65.8 kg)    Physical Exam Constitutional:      General: She is not in acute distress.    Appearance: She is well-developed.  HENT:     Head: Normocephalic.     Right Ear: External ear normal.     Left Ear: External ear normal.     Nose: Nose normal.  Eyes:     General:        Right eye: No discharge.        Left eye:  No discharge.     Conjunctiva/sclera: Conjunctivae normal.     Pupils: Pupils are equal, round, and reactive to light.  Neck:     Thyroid: No thyromegaly.     Vascular: No JVD.     Trachea: No tracheal deviation.  Cardiovascular:     Rate and Rhythm: Normal rate and regular rhythm.     Heart sounds: Normal heart sounds.  Pulmonary:     Effort: No respiratory distress.     Breath sounds: No stridor. No wheezing.  Abdominal:     General: Bowel sounds are normal. There is no distension.     Palpations: Abdomen is soft. There is no mass.     Tenderness: There is no abdominal tenderness. There is no guarding or rebound.  Musculoskeletal:        General: Tenderness present.     Cervical back: Normal range of motion and neck supple. No rigidity.  Lymphadenopathy:     Cervical: No cervical adenopathy.  Skin:    Findings: No erythema or rash.  Neurological:     Cranial Nerves: No cranial nerve deficit.     Motor: No abnormal muscle tone.     Coordination: Coordination normal.     Deep Tendon Reflexes: Reflexes normal.  Psychiatric:        Behavior: Behavior normal.        Thought Content: Thought content normal.        Judgment: Judgment normal.   L hip - very tender Antalgic gait; stiff  Procedure: EKG Indication: chest pain Impression: NSR. No acute changes.   Lab Results  Component Value Date   WBC 11.8 (H) 05/22/2022   HGB 14.3 05/22/2022   HCT 43.2 05/22/2022   PLT 265 05/22/2022   GLUCOSE 85 10/05/2022   CHOL 275 (H) 07/20/2020   TRIG 321 (H) 07/20/2020   HDL 40 (L) 07/20/2020   LDLDIRECT 170.0 12/05/2014   LDLCALC 171 (H) 07/20/2020   ALT 16 10/05/2022   AST 29 10/05/2022   NA 135 10/05/2022   K 4.0 10/05/2022   CL 99 10/05/2022   CREATININE 0.76 10/05/2022   BUN 11 10/05/2022   CO2 28 10/05/2022   TSH 1.83 01/23/2020   INR 1.0 05/22/2022   HGBA1C 5.9 (H) 07/20/2020    DG Lumbar Spine 2-3 Views  Result Date: 05/27/2022 CLINICAL DATA:  Fluoroscopic  assistance for lumbar spine fusion EXAM: LUMBAR SPINE - 2-3 VIEW COMPARISON:  CT done on 03/19/2022 FINDINGS: Fluoroscopic images show surgical fusion at the L4-L5 level. Intervertebral disc spacer is noted in place. Fluoroscopic time was 69 seconds. Radiation dose 52.67 mGy. IMPRESSION: Fluoroscopic assistance was provided  for surgical fusion at L4-L5 level. Electronically Signed   By: Ernie Avena M.D.   On: 05/27/2022 13:44   DG C-Arm 1-60 Min-No Report  Result Date: 05/27/2022 Fluoroscopy was utilized by the requesting physician.  No radiographic interpretation.   DG C-Arm 1-60 Min-No Report  Result Date: 05/27/2022 Fluoroscopy was utilized by the requesting physician.  No radiographic interpretation.   DG C-Arm 1-60 Min-No Report  Result Date: 05/27/2022 Fluoroscopy was utilized by the requesting physician.  No radiographic interpretation.    Assessment & Plan:   Problem List Items Addressed This Visit     Dyslipidemia   Relevant Orders   CBC with Differential/Platelet   Comprehensive metabolic panel   Protime-INR   COPD mixed type (HCC)    Pat is clear for her hip surgery assuming her labs/CXR are acceptable. She has been on chronic steroids. She needs stress steroids peri-operatively. She would benefit from using inhalers perioperatively       Preop exam for internal medicine - Primary    Dennie Bible is clear for her hip surgery assuming her labs/CXR are acceptable. She has been on chronic steroids. She needs stress steroids peri-operatively. She would benefit from using inhalers perioperatively EKG today w/o acute changes. Thank you!      Relevant Orders   EKG 12-Lead   CBC with Differential/Platelet   Comprehensive metabolic panel   Protime-INR   DG Chest 2 View   Coronary artery disease    Remote No CP      Edema    Trace edema B. Lasix prn      Polyarthralgia   Relevant Orders   CBC with Differential/Platelet   Comprehensive metabolic panel    Protime-INR      Meds ordered this encounter  Medications   potassium chloride (KLOR-CON M) 10 MEQ tablet    Sig: Take 1 tablet (10 mEq total) by mouth daily.    Dispense:  90 tablet    Refill:  3      Follow-up: Return in about 3 months (around 03/02/2023) for a follow-up visit.  Sonda Primes, MD

## 2022-11-30 NOTE — Assessment & Plan Note (Signed)
Debra Bible is clear for her hip surgery assuming her labs/CXR are acceptable. She has been on chronic steroids. She needs stress steroids peri-operatively. She would benefit from using inhalers perioperatively

## 2022-11-30 NOTE — Assessment & Plan Note (Signed)
Trace edema B. Lasix prn

## 2022-11-30 NOTE — Assessment & Plan Note (Signed)
Remote No CP

## 2022-12-02 ENCOUNTER — Telehealth: Payer: Self-pay | Admitting: Internal Medicine

## 2022-12-02 DIAGNOSIS — I2510 Atherosclerotic heart disease of native coronary artery without angina pectoris: Secondary | ICD-10-CM

## 2022-12-02 NOTE — Telephone Encounter (Signed)
Called pt she states was told by ortho scheduler that Dr. Winona Legato want her to see a cardiologist before she can have hip surgery. Pt states her cardiology is out of network, She would like referral  for cardiologist in her network...Raechel Chute

## 2022-12-02 NOTE — Telephone Encounter (Signed)
Patient would like a call back to discuss the need for a heart doctor. She is confused and would like to speak with a nurse. Best callback is [phone].

## 2022-12-03 DIAGNOSIS — Z6828 Body mass index (BMI) 28.0-28.9, adult: Secondary | ICD-10-CM | POA: Diagnosis not present

## 2022-12-03 DIAGNOSIS — M5416 Radiculopathy, lumbar region: Secondary | ICD-10-CM | POA: Diagnosis not present

## 2022-12-03 NOTE — Telephone Encounter (Signed)
Okay. Thank you.

## 2022-12-14 ENCOUNTER — Ambulatory Visit: Payer: PPO | Admitting: Internal Medicine

## 2022-12-21 ENCOUNTER — Ambulatory Visit: Payer: Self-pay | Admitting: Student

## 2022-12-21 NOTE — Progress Notes (Signed)
Surgery orders requested via Epic inbox. °

## 2022-12-23 NOTE — Patient Instructions (Addendum)
SURGICAL WAITING ROOM VISITATION  Patients having surgery or a procedure may have no more than 2 support people in the waiting area - these visitors may rotate.    Children under the age of 52 must have an adult with them who is not the patient.  Due to an increase in RSV and influenza rates and associated hospitalizations, children ages 43 and under may not visit patients in [facility] hospitals.  If the patient needs to stay at the hospital during part of their recovery, the visitor guidelines for inpatient rooms apply. Pre-op nurse will coordinate an appropriate time for 1 support person to accompany patient in pre-op.  This support person may not rotate.    Please refer to the [facility] website for the visitor guidelines for Inpatients (after your surgery is over and you are in a regular room).       Your procedure is scheduled on: 01/07/23   Report to [facility] Main Entrance    Report to admitting at  7:55 AM   Call this number if you have problems the morning of surgery [phone]   Do not eat food :After Midnight.   After Midnight you may have the following liquids until 7:30 AM  Water Non-Citrus Juices (without pulp, NO RED-Apple, White grape, White cranberry) Black Coffee (NO MILK/CREAM OR CREAMERS, sugar ok)  Clear Tea (NO MILK/CREAM OR CREAMERS, sugar ok) regular and decaf                             Plain Jell-O (NO RED)                                           Fruit ices (not with fruit pulp, NO RED)                                     Popsicles (NO RED)                                                               Sports drinks like Gatorade (NO RED)                  The day of surgery:  Drink ONE (1) Pre-Surgery Clear Ensure at 7:30 AM the morning of surgery. Drink in one sitting. Do not sip.  This drink was given to you during your hospital  pre-op appointment visit. Nothing else to drink after completing the  Pre-Surgery Clear Ensure      Oral Hygiene is also important to reduce your risk of infection.                                    Remember - BRUSH YOUR TEETH THE MORNING OF SURGERY WITH YOUR REGULAR TOOTHPASTE  DENTURES WILL BE REMOVED PRIOR TO SURGERY PLEASE DO NOT APPLY "Poly grip" OR ADHESIVES!!!   Do NOT smoke after Midnight   Take these medicines the morning of surgery : Arava(leflunomide), Prednisone, Tramadol  if needed             You may not have any metal on your body including hair pins, jewelry, and body piercing             Do not wear make-up, lotions, powders, perfumes, or deodorant  Do not wear nail polish including gel and S&S, artificial/acrylic nails, or any other type of covering on natural nails including finger and toenails. If you have artificial nails, gel coating, etc. that needs to be removed by a nail salon please have this removed prior to surgery or surgery may need to be canceled/ delayed if the surgeon/ anesthesia feels like they are unable to be safely monitored.   Do not shave  48 hours prior to surgery.    Do not bring valuables to the hospital. [facility] IS NOT             RESPONSIBLE   FOR VALUABLES.   Contacts, glasses, dentures or bridgework may not be worn into surgery.  DO NOT BRING YOUR HOME MEDICATIONS TO THE HOSPITAL. PHARMACY WILL DISPENSE MEDICATIONS LISTED ON YOUR MEDICATION LIST TO YOU DURING YOUR ADMISSION IN THE HOSPITAL!    Patients discharged on the day of surgery will not be allowed to drive home.  Someone NEEDS to stay with you for the first 24 hours after anesthesia.   Special Instructions: Bring a copy of your healthcare power of attorney and living will documents the day of surgery if you haven't scanned them before.              Please read over the following fact sheets you were given: IF YOU HAVE QUESTIONS ABOUT YOUR PRE-OP INSTRUCTIONS PLEASE CALL [phone]   If you received a COVID test during your pre-op visit  it is requested that you wear a mask  when out in public, stay away from anyone that may not be feeling well and notify your surgeon if you develop symptoms. If you test positive for Covid or have been in contact with anyone that has tested positive in the last 10 days please notify you surgeon.      Pre-operative 5 CHG Bath Instructions   You can play a key role in reducing the risk of infection after surgery. Your skin needs to be as free of germs as possible. You can reduce the number of germs on your skin by washing with CHG (chlorhexidine gluconate) soap before surgery. CHG is an antiseptic soap that kills germs and continues to kill germs even after washing.   DO NOT use if you have an allergy to chlorhexidine/CHG or antibacterial soaps. If your skin becomes reddened or irritated, stop using the CHG and notify one of our RNs at [phone].   Please shower with the CHG soap starting 4 days before surgery using the following schedule:     Please keep in mind the following:  DO NOT shave, including legs and underarms, starting the day of your first shower.   You may shave your face at any point before/day of surgery.  Place clean sheets on your bed the day you start using CHG soap. Use a clean washcloth (not used since being washed) for each shower. DO NOT sleep with pets once you start using the CHG.   CHG Shower Instructions:  If you choose to wash your hair and private area, wash first with your normal shampoo/soap.  After you use shampoo/soap, rinse your hair and body thoroughly to remove shampoo/soap residue.  Turn the water OFF and apply about 3 tablespoons (45 ml) of CHG soap to a CLEAN washcloth.  Apply CHG soap ONLY FROM YOUR NECK DOWN TO YOUR TOES (washing for 3-5 minutes)  DO NOT use CHG soap on face, private areas, open wounds, or sores.  Pay special attention to the area where your surgery is being performed.  If you are having back surgery, having someone wash your back for you may be helpful. Wait 2  minutes after CHG soap is applied, then you may rinse off the CHG soap.  Pat dry with a clean towel  Put on clean clothes/pajamas   If you choose to wear lotion, please use ONLY the CHG-compatible lotions on the back of this paper.     Additional instructions for the day of surgery: DO NOT APPLY any lotions, deodorants, cologne, or perfumes.   Put on clean/comfortable clothes.  Brush your teeth.  Ask your nurse before applying any prescription medications to the skin.      CHG Compatible Lotions   Aveeno Moisturizing lotion  Cetaphil Moisturizing Cream  Cetaphil Moisturizing Lotion  Clairol Herbal Essence Moisturizing Lotion, Dry Skin  Clairol Herbal Essence Moisturizing Lotion, Extra Dry Skin  Clairol Herbal Essence Moisturizing Lotion, Normal Skin  Curel Age Defying Therapeutic Moisturizing Lotion with Alpha Hydroxy  Curel Extreme Care Body Lotion  Curel Soothing Hands Moisturizing Hand Lotion  Curel Therapeutic Moisturizing Cream, Fragrance-Free  Curel Therapeutic Moisturizing Lotion, Fragrance-Free  Curel Therapeutic Moisturizing Lotion, Original Formula  Eucerin Daily Replenishing Lotion  Eucerin Dry Skin Therapy Plus Alpha Hydroxy Crme  Eucerin Dry Skin Therapy Plus Alpha Hydroxy Lotion  Eucerin Original Crme  Eucerin Original Lotion  Eucerin Plus Crme Eucerin Plus Lotion  Eucerin TriLipid Replenishing Lotion  Keri Anti-Bacterial Hand Lotion  Keri Deep Conditioning Original Lotion Dry Skin Formula Softly Scented  Keri Deep Conditioning Original Lotion, Fragrance Free Sensitive Skin Formula  Keri Lotion Fast Absorbing Fragrance Free Sensitive Skin Formula  Keri Lotion Fast Absorbing Softly Scented Dry Skin Formula  Keri Original Lotion  Keri Skin Renewal Lotion Keri Silky Smooth Lotion  Keri Silky Smooth Sensitive Skin Lotion  Nivea Body Creamy Conditioning Oil  Nivea Body Extra Enriched Lotion  Nivea Body Original Lotion  Nivea Body Sheer Moisturizing Lotion  Nivea Crme  Nivea Skin Firming Lotion  NutraDerm 30 Skin Lotion  NutraDerm Skin Lotion  NutraDerm Therapeutic Skin Cream  NutraDerm Therapeutic Skin Lotion  ProShield Protective Hand Cream   Incentive Spirometer (Watch this video at home: [email or web address])  An incentive spirometer is a tool that can help keep your lungs clear and active. This tool measures how well you are filling your lungs with each breath. Taking long deep breaths may help reverse or decrease the chance of developing breathing (pulmonary) problems (especially infection) following: A long period of time when you are unable to move or be active. BEFORE THE PROCEDURE  If the spirometer includes an indicator to show your best effort, your nurse or respiratory therapist will set it to a desired goal. If possible, sit up straight or lean slightly forward. Try not to slouch. Hold the incentive spirometer in an upright position. INSTRUCTIONS FOR USE  Sit on the edge of your bed if possible, or sit up as far as you can in bed or on a chair. Hold the incentive spirometer in an upright position. Breathe out normally. Place the mouthpiece in your mouth and seal your lips tightly around it. Breathe in slowly and as deeply as  possible, raising the piston or the ball toward the top of the column. Hold your breath for 3-5 seconds or for as long as possible. Allow the piston or ball to fall to the bottom of the column. Remove the mouthpiece from your mouth and breathe out normally. Rest for a few seconds and repeat Steps 1 through 7 at least 10 times every 1-2 hours when you are awake. Take your time and take a few normal breaths between deep breaths. The spirometer may include an indicator to show your best effort. Use the indicator as a goal to work toward during each repetition. After each set of 10 deep breaths, practice coughing to be sure your lungs are clear. If you have an incision (the cut made at  the time of surgery), support your incision when coughing by placing a pillow or rolled up towels firmly against it. Once you are able to get out of bed, walk around indoors and cough well. You may stop using the incentive spirometer when instructed by your caregiver.  RISKS AND COMPLICATIONS Take your time so you do not get dizzy or light-headed. If you are in pain, you may need to take or ask for pain medication before doing incentive spirometry. It is harder to take a deep breath if you are having pain. AFTER USE Rest and breathe slowly and easily. It can be helpful to keep track of a log of your progress. Your caregiver can provide you with a simple table to help with this. If you are using the spirometer at home, follow these instructions: SEEK MEDICAL CARE IF:  You are having difficultly using the spirometer. You have trouble using the spirometer as often as instructed. Your pain medication is not giving enough relief while using the spirometer. You develop fever of 100.5 F (38.1 C) or higher. SEEK IMMEDIATE MEDICAL CARE IF:  You cough up bloody sputum that had not been present before. You develop fever of 102 F (38.9 C) or greater. You develop worsening pain at or near the incision site. MAKE SURE YOU:  Understand these instructions. Will watch your condition. Will get help right away if you are not doing well or get worse  WHAT IS A BLOOD TRANSFUSION? Blood Transfusion Information  A transfusion is the replacement of blood or some of its parts. Blood is made up of multiple cells which provide different functions. Red blood cells carry oxygen and are used for blood loss replacement. White blood cells fight against infection. Platelets control bleeding. Plasma helps clot blood. Other blood products are available for specialized needs, such as hemophilia or other clotting disorders. BEFORE THE TRANSFUSION  Who gives blood for transfusions?  Healthy volunteers who are fully  evaluated to make sure their blood is safe. This is blood bank blood. Transfusion therapy is the safest it has ever been in the practice of medicine. Before blood is taken from a donor, a complete history is taken to make sure that person has no history of diseases nor engages in risky social behavior (examples are intravenous drug use or sexual activity with multiple partners). The donor's travel history is screened to minimize risk of transmitting infections, such as malaria. The donated blood is tested for signs of infectious diseases, such as HIV and hepatitis. The blood is then tested to be sure it is compatible with you in order to minimize the chance of a transfusion reaction. If you or a relative donates blood, this is often done in anticipation of surgery and  is not appropriate for emergency situations. It takes many days to process the donated blood. RISKS AND COMPLICATIONS Although transfusion therapy is very safe and saves many lives, the main dangers of transfusion include:  Getting an infectious disease. Developing a transfusion reaction. This is an allergic reaction to something in the blood you were given. Every precaution is taken to prevent this. The decision to have a blood transfusion has been considered carefully by your caregiver before blood is given. Blood is not given unless the benefits outweigh the risks. AFTER THE TRANSFUSION Right after receiving a blood transfusion, you will usually feel much better and more energetic. This is especially true if your red blood cells have gotten low (anemic). The transfusion raises the level of the red blood cells which carry oxygen, and this usually causes an energy increase. The nurse administering the transfusion will monitor you carefully for complications. HOME CARE INSTRUCTIONS  No special instructions are needed after a transfusion. You may find your energy is better. Speak with your caregiver about any limitations on activity for  underlying diseases you may have. SEEK MEDICAL CARE IF:  Your condition is not improving after your transfusion. You develop redness or irritation at the intravenous (IV) site. SEEK IMMEDIATE MEDICAL CARE IF:  Any of the following symptoms occur over the next 12 hours: Shaking chills. You have a temperature by mouth above 102 F (38.9 C), not controlled by medicine. Chest, back, or muscle pain. People around you feel you are not acting correctly or are confused. Shortness of breath or difficulty breathing. Dizziness and fainting. You get a rash or develop hives. You have a decrease in urine output. Your urine turns a dark color or changes to pink, red, or brown. Any of the following symptoms occur over the next 10 days: You have a temperature by mouth above 102 F (38.9 C), not controlled by medicine. Shortness of breath. Weakness after normal activity. The white part of the eye turns yellow (jaundice). You have a decrease in the amount of urine or are urinating less often. Your urine turns a dark color or changes to pink, red, or brown. Document Released: 05/29/2000 Document Revised: 08/24/2011 Document Reviewed: 01/16/2008 [facility] Patient Information 2014 [place], [place].

## 2022-12-23 NOTE — Progress Notes (Addendum)
COVID Vaccine received:  [x]  No []  Yes Date of any COVID positive Test in last 90 days: No PCP - Jacinta Shoe MD Cardiologist - Dr. Algie Coffer  Chest x-ray - 11/30/22  EPIC EKG -  11/30/22  EPIC Stress Test - no ECHO - 07/19/20 EPIC Cardiac Cath - 07/19/20  EPIC  Medical Clearance  Dr. Mervyn Skeeters. Plotnikov 11/30/22  Bowel Prep - [x]  No  []   Yes ______  Pacemaker / ICD device [x]  No []  Yes   Spinal Cord Stimulator:[x]  No []  Yes       History of Sleep Apnea? [x]  No []  Yes   CPAP used?- [x]  No []  Yes    Does the patient monitor blood sugar?          [x]  No []  Yes  []  N/A  Patient has: [x]  NO Hx DM   []  Pre-DM                 []  DM1  []   DM2 Does patient have a [employer] or Dexacom? [x]  No []  Yes   Fasting Blood Sugar Ranges- N/A Checks Blood Sugar __N/A___ times a day  GLP1 agonist / usual dose - no GLP1 instructions:  SGLT-2 inhibitors / usual dose - No SGLT-2 instructions:   Blood Thinner / Instructions: Aspirin Instructions:81mg  ASA. Instucted pt. To call Plotnikov to see when to stop Stopped Enbrel lastdose was last Saturday.7/13 Comments:   Activity level: Patient is able to climb a flight of stairs without difficulty; [x]  No CP  [x]  No SOB, but would have __hip pain_   Patient can perform ADLs without assistance.   Anesthesia review: CAD, COPD, Steroid dependence, PVD, stents 07/19/20  Patient denies shortness of breath, fever, cough and chest pain at PAT appointment.  Patient verbalized understanding and agreement to the Pre-Surgical Instructions that were given to them at this PAT appointment. Patient was also educated of the need to review these PAT instructions again prior to his/her surgery.I reviewed the appropriate phone numbers to call if they have any and questions or concerns.

## 2022-12-28 ENCOUNTER — Other Ambulatory Visit: Payer: Self-pay

## 2022-12-28 ENCOUNTER — Encounter (HOSPITAL_COMMUNITY): Payer: Self-pay

## 2022-12-28 ENCOUNTER — Encounter (HOSPITAL_COMMUNITY)
Admission: RE | Admit: 2022-12-28 | Discharge: 2022-12-28 | Disposition: A | Discharge status: Home / Self Care | Payer: PPO | Source: Ambulatory Visit | Attending: Orthopedic Surgery | Admitting: Orthopedic Surgery

## 2022-12-28 VITALS — BP 161/65 | HR 78 | Temp 98.2°F | Resp 18 | Ht 60.0 in | Wt 150.0 lb

## 2022-12-28 DIAGNOSIS — M069 Rheumatoid arthritis, unspecified: Secondary | ICD-10-CM | POA: Insufficient documentation

## 2022-12-28 DIAGNOSIS — I2510 Atherosclerotic heart disease of native coronary artery without angina pectoris: Secondary | ICD-10-CM | POA: Insufficient documentation

## 2022-12-28 DIAGNOSIS — M1612 Unilateral primary osteoarthritis, left hip: Secondary | ICD-10-CM | POA: Insufficient documentation

## 2022-12-28 DIAGNOSIS — Z01812 Encounter for preprocedural laboratory examination: Secondary | ICD-10-CM | POA: Insufficient documentation

## 2022-12-28 DIAGNOSIS — Z87891 Personal history of nicotine dependence: Secondary | ICD-10-CM | POA: Diagnosis not present

## 2022-12-28 DIAGNOSIS — J449 Chronic obstructive pulmonary disease, unspecified: Secondary | ICD-10-CM | POA: Diagnosis not present

## 2022-12-28 DIAGNOSIS — Z01818 Encounter for other preprocedural examination: Secondary | ICD-10-CM

## 2022-12-28 LAB — CBC
HCT: 41.2 % (ref 36.0–46.0)
Hemoglobin: 13.4 g/dL (ref 12.0–15.0)
MCH: 29.5 pg (ref 26.0–34.0)
MCHC: 32.5 g/dL (ref 30.0–36.0)
MCV: 90.5 fL (ref 80.0–100.0)
Platelets: 273 10*3/uL (ref 150–400)
RBC: 4.55 MIL/uL (ref 3.87–5.11)
RDW: 13.5 % (ref 11.5–15.5)
WBC: 9.4 10*3/uL (ref 4.0–10.5)
nRBC: 0 % (ref 0.0–0.2)

## 2022-12-28 LAB — BASIC METABOLIC PANEL
Anion gap: 7 (ref 5–15)
BUN: 15 mg/dL (ref 8–23)
CO2: 25 mmol/L (ref 22–32)
Calcium: 9.6 mg/dL (ref 8.9–10.3)
Chloride: 104 mmol/L (ref 98–111)
Creatinine, Ser: 0.62 mg/dL (ref 0.44–1.00)
GFR, Estimated: >60 mL/min (ref >60)
Glucose, Bld: 106 mg/dL — ABNORMAL HIGH (ref 70–99)
Potassium: 4.3 mmol/L (ref 3.5–5.1)
Sodium: 136 mmol/L (ref 135–145)

## 2022-12-28 LAB — SURGICAL PCR SCREEN
MRSA, PCR: NEGATIVE
Staphylococcus aureus: NEGATIVE

## 2022-12-29 NOTE — Progress Notes (Addendum)
Anesthesia Chart Review   Case: [id number] Date/Time: 01/07/23 1009   Procedure: TOTAL HIP ARTHROPLASTY ANTERIOR APPROACH (Left: Hip) - 120   Anesthesia type: Spinal   Pre-op diagnosis: Left hip osteoarthritis   Location: [facility]OM 08 / [facility]   Surgeons: Samson Frederic, MD       DISCUSSION:76 y.o. former smoker with h/o COPD, CAD s/p stents, RA, left hip OA scheduled for above procedure 01/07/2023 with Dr. Samson Frederic.   Pt seen by PCP 11/30/2022. Per OV note, "Dennie Bible is clear for her hip surgery assuming her labs/CXR are acceptable. She has been on chronic steroids. She needs stress steroids peri-operatively. She would benefit from using inhalers perioperatively"  Cardiac clearance received which states pt is optimized for planned procedure and is moderate risk.   VS: BP (!) 161/65   Pulse 78   Temp 36.8 C (Oral)   Resp 18   Ht 5' (1.524 m)   Wt 68 kg   SpO2 95%   BMI 29.29 kg/m   PROVIDERS: Plotnikov, Georgina Quint, MD is PCP    LABS: Labs reviewed: Acceptable for surgery. (all labs ordered are listed, but only abnormal results are displayed)  Labs Reviewed  BASIC METABOLIC PANEL - Abnormal; Notable for the following components:      Result Value   Glucose, Bld 106 (*)    All other components within normal limits  SURGICAL PCR SCREEN  CBC  TYPE AND SCREEN     IMAGES:   EKG:   CV: Echo 07/19/2020 1. Left ventricular ejection fraction, by estimation, is 55 to 60%. The  left ventricle has normal function. The left ventricle has no regional  wall motion abnormalities. There is mild concentric left ventricular  hypertrophy. Left ventricular diastolic  parameters are consistent with Grade I diastolic dysfunction (impaired  relaxation).   2. Right ventricular systolic function is normal. The right ventricular  size is normal. Mildly increased right ventricular wall thickness.   3. Left atrial size was mildly dilated.   4. Right atrial size was mildly dilated.   5.  The mitral valve is degenerative. Mild mitral valve regurgitation.   6. The aortic valve is tricuspid. There is moderate calcification of the  aortic valve. There is moderate thickening of the aortic valve. Aortic  valve regurgitation is mild. Mild aortic valve stenosis.   7. There is mild (Grade II) atheroma plaque involving the aortic root and  ascending aorta.   8. The inferior vena cava is normal in size with greater than 50%  respiratory variability, suggesting right atrial pressure of 3 mmHg.  Past Medical History:  Diagnosis Date   Arthritis    RA   Cancer (HCC) 1972   uterine cancer-hysterectomy   COPD (chronic obstructive pulmonary disease) (HCC)    GERD (gastroesophageal reflux disease)    Myocardial infarction (HCC) 07/19/2020   per patient-2 heart stents placed   Neuromuscular disorder (HCC) 02/05/2015   Bells Palsy, right   Pneumonia    10 years ago per pt    Past Surgical History:  Procedure Laterality Date   ABDOMINAL HYSTERECTOMY     partial   ANTERIOR CERVICAL DECOMPRESSION/DISCECTOMY FUSION 4 LEVELS N/A 04/03/2020   Procedure: ANTERIOR CERVICAL DECOMPRESSION/DISCECTOMY FUSION C3-7;  Surgeon: Venita Lick, MD;  Location: [facility];  Service: Orthopedics;  Laterality: N/A;  5 hrs   APPENDECTOMY     CATARACT EXTRACTION Bilateral    about 3 years ago    CHOLECYSTECTOMY     CORONARY STENT  INTERVENTION N/A 07/19/2020   Procedure: CORONARY STENT INTERVENTION;  Surgeon: Lyn Records, MD;  [facility];  Service: Cardiovascular;  Laterality: N/A;   KNEE ARTHROSCOPY  1999   Dr Thomasena Edis -- R knee   LEFT HEART CATH AND CORONARY ANGIOGRAPHY N/A 07/19/2020   Procedure: LEFT HEART CATH AND CORONARY ANGIOGRAPHY;  Surgeon: Orpah Cobb, MD;  Location: [facility] INVASIVE CV LAB;  Service: Cardiovascular;  Laterality: N/A;   LUMBAR LAMINECTOMY/DECOMPRESSION MICRODISCECTOMY Right 08/26/2016   Procedure: Microlumbar decompression L4-5, L5-S1 on Right;  Surgeon: Jene Every, MD;  Location: [facility];  Service: Orthopedics;  Laterality: Right;   POSTERIOR CERVICAL FUSION/FORAMINOTOMY N/A 04/04/2020   Procedure: POSTERIOR SPINAL FUSION INSTRUMENTATION CERVICAL THREE THROUGH SEVEN, CERVICAL THREE LAMINOTOMY;  Surgeon: Venita Lick, MD;  Location: [facility];  Service: Orthopedics;  Laterality: N/A;  5 hrs   TOTAL HIP ARTHROPLASTY Right 04/01/2017   Procedure: RIGHT TOTAL HIP ARTHROPLASTY ANTERIOR APPROACH;  Surgeon: Samson Frederic, MD;  Location: [facility];  Service: Orthopedics;  Laterality: Right;  Needs RNFA    MEDICATIONS:  aspirin EC 81 MG EC tablet   etanercept (ENBREL SURECLICK) 50 MG/ML injection   furosemide (LASIX) 20 MG tablet   linaclotide (LINZESS) 290 MCG CAPS capsule   Multiple Vitamins-Minerals (IMMUNE SUPPORT PO)   nitroGLYCERIN (NITROSTAT) 0.4 MG SL tablet   phentermine 37.5 MG capsule   potassium chloride (KLOR-CON M) 10 MEQ tablet   predniSONE (DELTASONE) 1 MG tablet   traMADol (ULTRAM) 50 MG tablet   zolpidem (AMBIEN) 10 MG tablet   No current facility-administered medications for this encounter.    Jodell Cipro Ward, PA-C [facility] [phone]

## 2023-01-06 ENCOUNTER — Ambulatory Visit: Payer: Self-pay | Admitting: Student

## 2023-01-06 NOTE — H&P (View-Only) (Signed)
TOTAL HIP ADMISSION H&P  Patient is admitted for left total hip arthroplasty.  Subjective:  Chief Complaint: left hip pain  HPI: Debra Farmer, 77 y.o. female, has a history of pain and functional disability in the left hip(s) due to arthritis and patient has failed non-surgical conservative treatments for greater than 12 weeks to include NSAID's and/or analgesics, flexibility and strengthening excercises, use of assistive devices, and activity modification.  Onset of symptoms was gradual starting 6 years ago with rapidlly worsening course since that time.The patient noted no past surgery on the left hip(s).  Patient currently rates pain in the left hip at 10 out of 10 with activity. Patient has night pain, worsening of pain with activity and weight bearing, trendelenberg gait, pain that interfers with activities of daily living, and pain with passive range of motion. Patient has evidence of subchondral cysts, subchondral sclerosis, periarticular osteophytes, and joint space narrowing by imaging studies. This condition presents safety issues increasing the risk of falls.  There is no current active infection.  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   Trochanteric bursitis of left hip 10/05/2022   S/P lumbar fusion 05/27/2022   Baker's cyst, ruptured 11/11/2021   Edema 11/11/2021   Generalized osteoarthritis 11/11/2021   Other long term (current) drug therapy 11/11/2021   Other specified abnormal immunological findings in serum 11/11/2021   Polyarthralgia 11/11/2021   Sciatica 11/11/2021   Constipation 11/11/2021   Hypercholesterolemia 06/23/2021   Neuropathy, peripheral 11/04/2020   Bilateral carpal tunnel syndrome 08/21/2020   Statin myopathy 08/11/2020   Pain in right hand 08/07/2020   Acute coronary syndrome (HCC) 07/19/2020   Coronary artery disease    COVID-19 05/16/2020   Cervical myelopathy (HCC) 04/03/2020   Grief at  loss of child 03/20/2020   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   Preop exam for internal medicine 03/20/2020   Bilateral hand numbness 01/24/2020   Paraplegia (HCC) 11/21/2019   Pain in left foot 12/30/2018   Osteopenia 06/21/2018   Peripheral vascular disease (HCC) 06/16/2018   Spondylolisthesis, grade 1 06/16/2018   Degeneration of lumbar intervertebral disc 02/03/2018   Scoliosis of lumbar spine 02/03/2018   History of total hip arthroplasty 12/03/2017   Coronary atherosclerosis 09/22/2017   Chest pain, atypical 09/22/2017   Primary osteoarthritis of right hip 04/01/2017   Osteoarthritis of right hip 04/01/2017   Osteoporosis 03/03/2017   Decreased libido 10/19/2016   Spinal stenosis at L4-L5 level 08/26/2016   Hip osteoarthritis 04/28/2016   Neuropathy of right peroneal nerve 02/10/2016   Diarrhea 09/23/2015   Bell's palsy 03/02/2015   Seronegative rheumatoid arthritis (HCC) 03/02/2015   Neck pain 04/26/2014   Herpes zoster 04/05/2014   Thrush, oral 03/06/2014   Left arm swelling 01/15/2013   Cough 09/19/2012   Insomnia 09/01/2012   CTS (carpal tunnel syndrome) 04/15/2012   Arthritis of multiple sites 03/04/2012   Well adult exam 01/27/2012   Knee pain, bilateral 01/08/2012   Hip pain, right 12/26/2010   DISCITIS 07/25/2010   SWEATING 05/23/2010   PARESTHESIA, HANDS 04/25/2010   COPD mixed type (HCC) 07/26/2009   SINUSITIS, ACUTE 08/01/2008   RASH AND OTHER NONSPECIFIC SKIN ERUPTION 08/01/2008   TOBACCO USE DISORDER/SMOKER-SMOKING CESSATION DISCUSSED 03/08/2008   BRONCHITIS, ACUTE 03/08/2008   Dyslipidemia 02/27/2008   CRAMP OF LIMB 02/27/2008   FATIGUE 02/27/2008   Wheezing 02/27/2008   MENINGITIS 08/11/2007   GERD 08/11/2007   HEADACHE 08/11/2007   OSTEOARTHRITIS 01/11/2007  Past Medical History:  Diagnosis Date   Arthritis    RA   Cancer (HCC) 1972   uterine cancer-hysterectomy   COPD (chronic obstructive pulmonary disease) (HCC)    GERD  (gastroesophageal reflux disease)    Myocardial infarction (HCC) 07/19/2020   per patient-2 heart stents placed   Neuromuscular disorder (HCC) 02/05/2015   Bells Palsy, right   Pneumonia    10 years ago per pt    Past Surgical History:  Procedure Laterality Date   ABDOMINAL HYSTERECTOMY     partial   ANTERIOR CERVICAL DECOMPRESSION/DISCECTOMY FUSION 4 LEVELS N/A 04/03/2020   Procedure: ANTERIOR CERVICAL DECOMPRESSION/DISCECTOMY FUSION C3-7;  Surgeon: Venita Lick, MD;  Location: [facility];  Service: Orthopedics;  Laterality: N/A;  5 hrs   APPENDECTOMY     CATARACT EXTRACTION Bilateral    about 3 years ago    CHOLECYSTECTOMY     CORONARY STENT INTERVENTION N/A 07/19/2020   Procedure: CORONARY STENT INTERVENTION;  Surgeon: Lyn Records, MD;  Location: [facility];  Service: Cardiovascular;  Laterality: N/A;   KNEE ARTHROSCOPY  1999   Dr Thomasena Edis -- R knee   LEFT HEART CATH AND CORONARY ANGIOGRAPHY N/A 07/19/2020   Procedure: LEFT HEART CATH AND CORONARY ANGIOGRAPHY;  Surgeon: Orpah Cobb, MD;  Location: [facility];  Service: Cardiovascular;  Laterality: N/A;   LUMBAR LAMINECTOMY/DECOMPRESSION MICRODISCECTOMY Right 08/26/2016   Procedure: Microlumbar decompression L4-5, L5-S1 on Right;  Surgeon: Jene Every, MD;  Location: [facility];  Service: Orthopedics;  Laterality: Right;   POSTERIOR CERVICAL FUSION/FORAMINOTOMY N/A 04/04/2020   Procedure: POSTERIOR SPINAL FUSION INSTRUMENTATION CERVICAL THREE THROUGH SEVEN, CERVICAL THREE LAMINOTOMY;  Surgeon: Venita Lick, MD;  Location: [facility];  Service: Orthopedics;  Laterality: N/A;  5 hrs   TOTAL HIP ARTHROPLASTY Right 04/01/2017   Procedure: RIGHT TOTAL HIP ARTHROPLASTY ANTERIOR APPROACH;  Surgeon: Samson Frederic, MD;  Location: [facility];  Service: Orthopedics;  Laterality: Right;  Needs RNFA    Current Outpatient Medications  Medication Sig Dispense Refill Last Dose   aspirin EC 81 MG EC tablet Take 1 tablet (81 mg total) by mouth  daily. Swallow whole. 30 tablet 11    etanercept (ENBREL SURECLICK) 50 MG/ML injection Inject 50 mg into the skin every Saturday.      furosemide (LASIX) 20 MG tablet TAKE 1-2 TABLETS BY MOUTH DAILY AS NEEDED FOR EDEMA (Patient taking differently: Take 20 mg by mouth daily as needed for edema.) 60 tablet 5    linaclotide (LINZESS) 290 MCG CAPS capsule Take 1 capsule (290 mcg total) by mouth daily. (Patient taking differently: Take 290 mcg by mouth daily as needed (constipation).) 30 capsule 11    Multiple Vitamins-Minerals (IMMUNE SUPPORT PO) Take 1 tablet by mouth daily.      nitroGLYCERIN (NITROSTAT) 0.4 MG SL tablet Place 1 tablet (0.4 mg total) under the tongue every 5 (five) minutes x 3 doses as needed for chest pain. 25 tablet 1    phentermine 37.5 MG capsule Take 1 capsule (37.5 mg total) by mouth every morning. 30 capsule 2    potassium chloride (KLOR-CON M) 10 MEQ tablet Take 1 tablet (10 mEq total) by mouth daily. (Patient taking differently: Take 10 mEq by mouth daily as needed (when taking furosemide).) 90 tablet 3    predniSONE (DELTASONE) 1 MG tablet Take 4 mg by mouth daily with breakfast.      traMADol (ULTRAM) 50 MG tablet Take 1 tablet (50 mg total) by mouth every 6 (six)  hours. 120 tablet 3    zolpidem (AMBIEN) 10 MG tablet TAKE 1 TABLET BY MOUTH EACH NIGHT AT BEDTIME FOR SLEEP 90 tablet 1    No current facility-administered medications for this visit.   Allergies  Allergen Reactions   Aspirin Other (See Comments)    UPSET STOMACH. MUST HAVE COATED ASA   Codeine Sulfate Other (See Comments)    "drugged out feeling" & hallucinations   Diltiazem     fatigue   Gabapentin     Too sedated   Lipitor [Atorvastatin] Other (See Comments)    Muscle cramps   Methotrexate Nausea And Vomiting and Swelling   Miralax [Polyethylene Glycol]     bloating   Nabumetone Other (See Comments)    Unsure of reaction (reaction occurred sometime ago)   Plaquenil [Hydroxychloroquine]     Weak,  blurred vision   Pravastatin     Weak legs, arthralgias   Sarilumab      increased cholesterol  Kevsarsa*   Senna Other (See Comments)    Causes bleeding from the anus   Statins Other (See Comments)    Muscle aches and cramps    Social History   Tobacco Use   Smoking status: Former    Current packs/day: 0.00    Average packs/day: 0.5 packs/day for 58.0 years (29.0 ttl pk-yrs)    Types: Cigarettes    Start date: 02/16/1960    Quit date: 02/15/2018    Years since quitting: 4.8   Smokeless tobacco: Never  Substance Use Topics   Alcohol use: No    Family History  Problem Relation Age of Onset   Heart disease Mother 55       CHF   COPD Father 36       emphesema     Review of Systems  Musculoskeletal:  Positive for arthralgias and gait problem.    Objective:  Physical Exam Constitutional:      Appearance: Normal appearance.  HENT:     Head: Normocephalic and atraumatic.     Nose: Nose normal.     Mouth/Throat:     Mouth: Mucous membranes are moist.     Pharynx: Oropharynx is clear.  Eyes:     Conjunctiva/sclera: Conjunctivae normal.  Cardiovascular:     Rate and Rhythm: Normal rate and regular rhythm.     Pulses: Normal pulses.     Heart sounds: Normal heart sounds.  Pulmonary:     Effort: Pulmonary effort is normal.     Breath sounds: Normal breath sounds.  Abdominal:     General: Abdomen is flat.     Palpations: Abdomen is soft.  Genitourinary:    Comments: deferred Musculoskeletal:     Cervical back: Normal range of motion and neck supple.     Comments: Examination of the left hip reveals no skin wounds or lesions. Pain with flexion and rotation of the hip. Trochanteric tenderness to palpation. Hip abductor strength 4/5.   Sensory and motor function intact in LE bilaterally. Distal pedal pulses 2+ bilaterally.   No significant pedal edema.  Skin:    General: Skin is warm and dry.     Capillary Refill: Capillary refill takes less than 2 seconds.   Neurological:     General: No focal deficit present.     Mental Status: She is alert and oriented to person, place, and time.  Psychiatric:        Mood and Affect: Mood normal.        Behavior: Behavior  normal.        Thought Content: Thought content normal.        Judgment: Judgment normal.     Vital signs in last 24 hours: @VSRANGES @  Labs:   Estimated body mass index is 29.29 kg/m as calculated from the following:   Height as of 12/28/22: 5' (1.524 m).   Weight as of 12/28/22: 68 kg.   Imaging Review Plain radiographs demonstrate severe degenerative joint disease of the left hip(s). The bone quality appears to be adequate for age and reported activity level.      Assessment/Plan:  End stage arthritis, left hip(s)  The patient history, physical examination, clinical judgement of the provider and imaging studies are consistent with end stage degenerative joint disease of the left hip(s) and total hip arthroplasty is deemed medically necessary. The treatment options including medical management, injection therapy, arthroscopy and arthroplasty were discussed at length. The risks and benefits of total hip arthroplasty were presented and reviewed. The risks due to aseptic loosening, infection, stiffness, dislocation/subluxation,  thromboembolic complications and other imponderables were discussed.  The patient acknowledged the explanation, agreed to proceed with the plan and consent was signed. Patient is being admitted for inpatient treatment for surgery, pain control, PT, OT, prophylactic antibiotics, VTE prophylaxis, progressive ambulation and ADL's and discharge planning.The patient is planning to be discharged home with HEP.   Therapy Plans: HEP.  Disposition: Home with husband Planned DVT Prophylaxis: aspirin 81mg  BID DME needed: Has rolling walker.  PCP: Cleared.  Rheumatology: Hold enbrel 1 week prior to surgery, restart 1-2 weeks PO. Continue low dose prednisone for  adrenal insufficiency. Continue hydrochloroquine and Arava (patient states no longer taking).  TXA: IV Allergies: NDKA. Anesthesia Concerns: None. BMI: 29.4 Last HgbA1c: not diabetic.  Other: - Adrenal insufficiency history.  - COPD history.  - CAD, 2 stents 2021.  - RA, Enbrel weekly injections. Has held 2 injections. Discussed continue to hold potentially 4-6 weeks after surgery.  - Staples vs sutures, patient said no issues with dissolvable sutures on right hip surgery.  - Aspirin 81mg  baseline.  - Tramadol 4x/day at baseline. PCP.  - Hydrocodone, zofran.  - 11/30/22: Hgb 14.2, K+ 3.7, Cr. 0.72 - Labs pending.    Patient's anticipated LOS is less than 2 midnights, meeting these requirements: - Younger than 9 - Lives within 1 hour of care - Has a competent adult at home to recover with post-op recover - NO history of  - Chronic pain requiring opiods  - Diabetes  - Coronary Artery Disease  - Heart failure  - Heart attack  - Stroke  - DVT/VTE  - Cardiac arrhythmia  - Respiratory Failure/COPD  - Renal failure  - Anemia  - Advanced Liver disease

## 2023-01-06 NOTE — H&P (Signed)
TOTAL HIP ADMISSION H&P  Patient is admitted for left total hip arthroplasty.  Subjective:  Chief Complaint: left hip pain  HPI: Debra Farmer, 77 y.o. female, has a history of pain and functional disability in the left hip(s) due to arthritis and patient has failed non-surgical conservative treatments for greater than 12 weeks to include NSAID's and/or analgesics, flexibility and strengthening excercises, use of assistive devices, and activity modification.  Onset of symptoms was gradual starting 6 years ago with rapidlly worsening course since that time.The patient noted no past surgery on the left hip(s).  Patient currently rates pain in the left hip at 10 out of 10 with activity. Patient has night pain, worsening of pain with activity and weight bearing, trendelenberg gait, pain that interfers with activities of daily living, and pain with passive range of motion. Patient has evidence of subchondral cysts, subchondral sclerosis, periarticular osteophytes, and joint space narrowing by imaging studies. This condition presents safety issues increasing the risk of falls.  There is no current active infection.  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   Trochanteric bursitis of left hip 10/05/2022   S/P lumbar fusion 05/27/2022   Baker's cyst, ruptured 11/11/2021   Edema 11/11/2021   Generalized osteoarthritis 11/11/2021   Other long term (current) drug therapy 11/11/2021   Other specified abnormal immunological findings in serum 11/11/2021   Polyarthralgia 11/11/2021   Sciatica 11/11/2021   Constipation 11/11/2021   Hypercholesterolemia 06/23/2021   Neuropathy, peripheral 11/04/2020   Bilateral carpal tunnel syndrome 08/21/2020   Statin myopathy 08/11/2020   Pain in right hand 08/07/2020   Acute coronary syndrome (HCC) 07/19/2020   Coronary artery disease    COVID-19 05/16/2020   Cervical myelopathy (HCC) 04/03/2020   Grief at  loss of child 03/20/2020   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   Preop exam for internal medicine 03/20/2020   Bilateral hand numbness 01/24/2020   Paraplegia (HCC) 11/21/2019   Pain in left foot 12/30/2018   Osteopenia 06/21/2018   Peripheral vascular disease (HCC) 06/16/2018   Spondylolisthesis, grade 1 06/16/2018   Degeneration of lumbar intervertebral disc 02/03/2018   Scoliosis of lumbar spine 02/03/2018   History of total hip arthroplasty 12/03/2017   Coronary atherosclerosis 09/22/2017   Chest pain, atypical 09/22/2017   Primary osteoarthritis of right hip 04/01/2017   Osteoarthritis of right hip 04/01/2017   Osteoporosis 03/03/2017   Decreased libido 10/19/2016   Spinal stenosis at L4-L5 level 08/26/2016   Hip osteoarthritis 04/28/2016   Neuropathy of right peroneal nerve 02/10/2016   Diarrhea 09/23/2015   Bell's palsy 03/02/2015   Seronegative rheumatoid arthritis (HCC) 03/02/2015   Neck pain 04/26/2014   Herpes zoster 04/05/2014   Thrush, oral 03/06/2014   Left arm swelling 01/15/2013   Cough 09/19/2012   Insomnia 09/01/2012   CTS (carpal tunnel syndrome) 04/15/2012   Arthritis of multiple sites 03/04/2012   Well adult exam 01/27/2012   Knee pain, bilateral 01/08/2012   Hip pain, right 12/26/2010   DISCITIS 07/25/2010   SWEATING 05/23/2010   PARESTHESIA, HANDS 04/25/2010   COPD mixed type (HCC) 07/26/2009   SINUSITIS, ACUTE 08/01/2008   RASH AND OTHER NONSPECIFIC SKIN ERUPTION 08/01/2008   TOBACCO USE DISORDER/SMOKER-SMOKING CESSATION DISCUSSED 03/08/2008   BRONCHITIS, ACUTE 03/08/2008   Dyslipidemia 02/27/2008   CRAMP OF LIMB 02/27/2008   FATIGUE 02/27/2008   Wheezing 02/27/2008   MENINGITIS 08/11/2007   GERD 08/11/2007   HEADACHE 08/11/2007   OSTEOARTHRITIS 01/11/2007  Past Medical History:  Diagnosis Date   Arthritis    RA   Cancer (HCC) 1972   uterine cancer-hysterectomy   COPD (chronic obstructive pulmonary disease) (HCC)    GERD  (gastroesophageal reflux disease)    Myocardial infarction (HCC) 07/19/2020   per patient-2 heart stents placed   Neuromuscular disorder (HCC) 02/05/2015   Bells Palsy, right   Pneumonia    10 years ago per pt    Past Surgical History:  Procedure Laterality Date   ABDOMINAL HYSTERECTOMY     partial   ANTERIOR CERVICAL DECOMPRESSION/DISCECTOMY FUSION 4 LEVELS N/A 04/03/2020   Procedure: ANTERIOR CERVICAL DECOMPRESSION/DISCECTOMY FUSION C3-7;  Surgeon: Venita Lick, MD;  Location: [facility] OR;  Service: Orthopedics;  Laterality: N/A;  5 hrs   APPENDECTOMY     CATARACT EXTRACTION Bilateral    about 3 years ago    CHOLECYSTECTOMY     CORONARY STENT INTERVENTION N/A 07/19/2020   Procedure: CORONARY STENT INTERVENTION;  Surgeon: Lyn Records, MD;  Location: [facility];  Service: Cardiovascular;  Laterality: N/A;   KNEE ARTHROSCOPY  1999   Dr Thomasena Edis -- R knee   LEFT HEART CATH AND CORONARY ANGIOGRAPHY N/A 07/19/2020   Procedure: LEFT HEART CATH AND CORONARY ANGIOGRAPHY;  Surgeon: Orpah Cobb, MD;  Location: [facility];  Service: Cardiovascular;  Laterality: N/A;   LUMBAR LAMINECTOMY/DECOMPRESSION MICRODISCECTOMY Right 08/26/2016   Procedure: Microlumbar decompression L4-5, L5-S1 on Right;  Surgeon: Jene Every, MD;  Location: [facility];  Service: Orthopedics;  Laterality: Right;   POSTERIOR CERVICAL FUSION/FORAMINOTOMY N/A 04/04/2020   Procedure: POSTERIOR SPINAL FUSION INSTRUMENTATION CERVICAL THREE THROUGH SEVEN, CERVICAL THREE LAMINOTOMY;  Surgeon: Venita Lick, MD;  Location: [facility] OR;  Service: Orthopedics;  Laterality: N/A;  5 hrs   TOTAL HIP ARTHROPLASTY Right 04/01/2017   Procedure: RIGHT TOTAL HIP ARTHROPLASTY ANTERIOR APPROACH;  Surgeon: Samson Frederic, MD;  Location: [facility];  Service: Orthopedics;  Laterality: Right;  Needs RNFA    Current Outpatient Medications  Medication Sig Dispense Refill Last Dose   aspirin EC 81 MG EC tablet Take 1 tablet (81 mg total) by mouth  daily. Swallow whole. 30 tablet 11    etanercept (ENBREL SURECLICK) 50 MG/ML injection Inject 50 mg into the skin every Saturday.      furosemide (LASIX) 20 MG tablet TAKE 1-2 TABLETS BY MOUTH DAILY AS NEEDED FOR EDEMA (Patient taking differently: Take 20 mg by mouth daily as needed for edema.) 60 tablet 5    linaclotide (LINZESS) 290 MCG CAPS capsule Take 1 capsule (290 mcg total) by mouth daily. (Patient taking differently: Take 290 mcg by mouth daily as needed (constipation).) 30 capsule 11    Multiple Vitamins-Minerals (IMMUNE SUPPORT PO) Take 1 tablet by mouth daily.      nitroGLYCERIN (NITROSTAT) 0.4 MG SL tablet Place 1 tablet (0.4 mg total) under the tongue every 5 (five) minutes x 3 doses as needed for chest pain. 25 tablet 1    phentermine 37.5 MG capsule Take 1 capsule (37.5 mg total) by mouth every morning. 30 capsule 2    potassium chloride (KLOR-CON M) 10 MEQ tablet Take 1 tablet (10 mEq total) by mouth daily. (Patient taking differently: Take 10 mEq by mouth daily as needed (when taking furosemide).) 90 tablet 3    predniSONE (DELTASONE) 1 MG tablet Take 4 mg by mouth daily with breakfast.      traMADol (ULTRAM) 50 MG tablet Take 1 tablet (50 mg total) by mouth every 6 (six)  hours. 120 tablet 3    zolpidem (AMBIEN) 10 MG tablet TAKE 1 TABLET BY MOUTH EACH NIGHT AT BEDTIME FOR SLEEP 90 tablet 1    No current facility-administered medications for this visit.   Allergies  Allergen Reactions   Aspirin Other (See Comments)    UPSET STOMACH. MUST HAVE COATED ASA   Codeine Sulfate Other (See Comments)    "drugged out feeling" & hallucinations   Diltiazem     fatigue   Gabapentin     Too sedated   Lipitor [Atorvastatin] Other (See Comments)    Muscle cramps   Methotrexate Nausea And Vomiting and Swelling   Miralax [Polyethylene Glycol]     bloating   Nabumetone Other (See Comments)    Unsure of reaction (reaction occurred sometime ago)   Plaquenil [Hydroxychloroquine]     Weak,  blurred vision   Pravastatin     Weak legs, arthralgias   Sarilumab      increased cholesterol  Kevsarsa*   Senna Other (See Comments)    Causes bleeding from the anus   Statins Other (See Comments)    Muscle aches and cramps    Social History   Tobacco Use   Smoking status: Former    Current packs/day: 0.00    Average packs/day: 0.5 packs/day for 58.0 years (29.0 ttl pk-yrs)    Types: Cigarettes    Start date: 02/16/1960    Quit date: 02/15/2018    Years since quitting: 4.8   Smokeless tobacco: Never  Substance Use Topics   Alcohol use: No    Family History  Problem Relation Age of Onset   Heart disease Mother 55       CHF   COPD Father 36       emphesema     Review of Systems  Musculoskeletal:  Positive for arthralgias and gait problem.    Objective:  Physical Exam Constitutional:      Appearance: Normal appearance.  HENT:     Head: Normocephalic and atraumatic.     Nose: Nose normal.     Mouth/Throat:     Mouth: Mucous membranes are moist.     Pharynx: Oropharynx is clear.  Eyes:     Conjunctiva/sclera: Conjunctivae normal.  Cardiovascular:     Rate and Rhythm: Normal rate and regular rhythm.     Pulses: Normal pulses.     Heart sounds: Normal heart sounds.  Pulmonary:     Effort: Pulmonary effort is normal.     Breath sounds: Normal breath sounds.  Abdominal:     General: Abdomen is flat.     Palpations: Abdomen is soft.  Genitourinary:    Comments: deferred Musculoskeletal:     Cervical back: Normal range of motion and neck supple.     Comments: Examination of the left hip reveals no skin wounds or lesions. Pain with flexion and rotation of the hip. Trochanteric tenderness to palpation. Hip abductor strength 4/5.   Sensory and motor function intact in LE bilaterally. Distal pedal pulses 2+ bilaterally.   No significant pedal edema.  Skin:    General: Skin is warm and dry.     Capillary Refill: Capillary refill takes less than 2 seconds.   Neurological:     General: No focal deficit present.     Mental Status: She is alert and oriented to person, place, and time.  Psychiatric:        Mood and Affect: Mood normal.        Behavior: Behavior  normal.        Thought Content: Thought content normal.        Judgment: Judgment normal.     Vital signs in last 24 hours: @VSRANGES @  Labs:   Estimated body mass index is 29.29 kg/m as calculated from the following:   Height as of 12/28/22: 5' (1.524 m).   Weight as of 12/28/22: 68 kg.   Imaging Review Plain radiographs demonstrate severe degenerative joint disease of the left hip(s). The bone quality appears to be adequate for age and reported activity level.      Assessment/Plan:  End stage arthritis, left hip(s)  The patient history, physical examination, clinical judgement of the provider and imaging studies are consistent with end stage degenerative joint disease of the left hip(s) and total hip arthroplasty is deemed medically necessary. The treatment options including medical management, injection therapy, arthroscopy and arthroplasty were discussed at length. The risks and benefits of total hip arthroplasty were presented and reviewed. The risks due to aseptic loosening, infection, stiffness, dislocation/subluxation,  thromboembolic complications and other imponderables were discussed.  The patient acknowledged the explanation, agreed to proceed with the plan and consent was signed. Patient is being admitted for inpatient treatment for surgery, pain control, PT, OT, prophylactic antibiotics, VTE prophylaxis, progressive ambulation and ADL's and discharge planning.The patient is planning to be discharged home with HEP.   Therapy Plans: HEP.  Disposition: Home with husband Planned DVT Prophylaxis: aspirin 81mg  BID DME needed: Has rolling walker.  PCP: Cleared.  Rheumatology: Hold enbrel 1 week prior to surgery, restart 1-2 weeks PO. Continue low dose prednisone for  adrenal insufficiency. Continue hydrochloroquine and Arava (patient states no longer taking).  TXA: IV Allergies: NDKA. Anesthesia Concerns: None. BMI: 29.4 Last HgbA1c: not diabetic.  Other: - Adrenal insufficiency history.  - COPD history.  - CAD, 2 stents 2021.  - RA, Enbrel weekly injections. Has held 2 injections. Discussed continue to hold potentially 4-6 weeks after surgery.  - Staples vs sutures, patient said no issues with dissolvable sutures on right hip surgery.  - Aspirin 81mg  baseline.  - Tramadol 4x/day at baseline. PCP.  - Hydrocodone, zofran.  - 11/30/22: Hgb 14.2, K+ 3.7, Cr. 0.72 - Labs pending.    Patient's anticipated LOS is less than 2 midnights, meeting these requirements: - Younger than 9 - Lives within 1 hour of care - Has a competent adult at home to recover with post-op recover - NO history of  - Chronic pain requiring opiods  - Diabetes  - Coronary Artery Disease  - Heart failure  - Heart attack  - Stroke  - DVT/VTE  - Cardiac arrhythmia  - Respiratory Failure/COPD  - Renal failure  - Anemia  - Advanced Liver disease

## 2023-01-07 ENCOUNTER — Ambulatory Visit (HOSPITAL_BASED_OUTPATIENT_CLINIC_OR_DEPARTMENT_OTHER): Payer: PPO | Admitting: Anesthesiology

## 2023-01-07 ENCOUNTER — Ambulatory Visit (HOSPITAL_COMMUNITY): Payer: PPO | Admitting: Physician Assistant

## 2023-01-07 ENCOUNTER — Encounter (HOSPITAL_COMMUNITY): Payer: Self-pay | Admitting: Orthopedic Surgery

## 2023-01-07 ENCOUNTER — Encounter (HOSPITAL_COMMUNITY)
Admission: RE | Disposition: A | Discharge status: Home / Self Care | Payer: Self-pay | Source: Ambulatory Visit | Attending: Orthopedic Surgery

## 2023-01-07 ENCOUNTER — Ambulatory Visit (HOSPITAL_COMMUNITY): Payer: PPO

## 2023-01-07 ENCOUNTER — Ambulatory Visit (HOSPITAL_COMMUNITY)
Admission: RE | Admit: 2023-01-07 | Discharge: 2023-01-07 | Disposition: A | Discharge status: Home / Self Care | Payer: PPO | Source: Ambulatory Visit | Attending: Orthopedic Surgery | Admitting: Orthopedic Surgery

## 2023-01-07 ENCOUNTER — Other Ambulatory Visit: Payer: Self-pay

## 2023-01-07 DIAGNOSIS — Z7982 Long term (current) use of aspirin: Secondary | ICD-10-CM | POA: Insufficient documentation

## 2023-01-07 DIAGNOSIS — J449 Chronic obstructive pulmonary disease, unspecified: Secondary | ICD-10-CM | POA: Diagnosis not present

## 2023-01-07 DIAGNOSIS — M1612 Unilateral primary osteoarthritis, left hip: Secondary | ICD-10-CM

## 2023-01-07 DIAGNOSIS — Z96643 Presence of artificial hip joint, bilateral: Secondary | ICD-10-CM | POA: Diagnosis not present

## 2023-01-07 DIAGNOSIS — M069 Rheumatoid arthritis, unspecified: Secondary | ICD-10-CM | POA: Diagnosis not present

## 2023-01-07 DIAGNOSIS — I2510 Atherosclerotic heart disease of native coronary artery without angina pectoris: Secondary | ICD-10-CM | POA: Diagnosis not present

## 2023-01-07 DIAGNOSIS — Z09 Encounter for follow-up examination after completed treatment for conditions other than malignant neoplasm: Secondary | ICD-10-CM | POA: Diagnosis not present

## 2023-01-07 DIAGNOSIS — Z87891 Personal history of nicotine dependence: Secondary | ICD-10-CM | POA: Diagnosis not present

## 2023-01-07 DIAGNOSIS — F17210 Nicotine dependence, cigarettes, uncomplicated: Secondary | ICD-10-CM | POA: Diagnosis not present

## 2023-01-07 DIAGNOSIS — Z79899 Other long term (current) drug therapy: Secondary | ICD-10-CM | POA: Diagnosis not present

## 2023-01-07 DIAGNOSIS — Z96642 Presence of left artificial hip joint: Secondary | ICD-10-CM | POA: Diagnosis not present

## 2023-01-07 DIAGNOSIS — M1611 Unilateral primary osteoarthritis, right hip: Secondary | ICD-10-CM | POA: Diagnosis not present

## 2023-01-07 HISTORY — PX: TOTAL HIP ARTHROPLASTY: SHX124

## 2023-01-07 LAB — TYPE AND SCREEN
ABO/RH(D): O POS
Antibody Screen: NEGATIVE

## 2023-01-07 SURGERY — ARTHROPLASTY, HIP, TOTAL, ANTERIOR APPROACH
Anesthesia: Spinal | Site: Hip | Laterality: Left

## 2023-01-07 MED ORDER — ORAL CARE MOUTH RINSE: 15.0000 mL | Freq: Once | OROMUCOSAL | Status: AC

## 2023-01-07 MED ORDER — SODIUM CHLORIDE (PF) 0.9 % IJ SOLN
INTRAMUSCULAR | Status: AC
  Filled 2023-01-07: qty 50

## 2023-01-07 MED ORDER — HYDROCODONE-ACETAMINOPHEN 5-325 MG PO TABS: 1.0000 | ORAL_TABLET | ORAL | 0 refills | Status: AC | PRN

## 2023-01-07 MED ORDER — FENTANYL CITRATE (PF) 100 MCG/2ML IJ SOLN
INTRAMUSCULAR | Status: AC
  Filled 2023-01-07: qty 2

## 2023-01-07 MED ORDER — POVIDONE-IODINE 10 % EX SWAB: 2.0000 | Freq: Once | CUTANEOUS | Status: DC

## 2023-01-07 MED ORDER — CHLORHEXIDINE GLUCONATE 0.12 % MT SOLN
15.0000 mL | Freq: Once | OROMUCOSAL | Status: AC
  Administered 2023-01-07: 15 mL via OROMUCOSAL

## 2023-01-07 MED ORDER — FENTANYL CITRATE PF 50 MCG/ML IJ SOSY: 25.0000 ug | PREFILLED_SYRINGE | INTRAMUSCULAR | Status: DC | PRN

## 2023-01-07 MED ORDER — SENNA 8.6 MG PO TABS: 2.0000 | ORAL_TABLET | Freq: Every day | ORAL | 0 refills | Status: AC

## 2023-01-07 MED ORDER — ACETAMINOPHEN 500 MG PO TABS
1000.0000 mg | ORAL_TABLET | Freq: Once | ORAL | Status: AC
  Administered 2023-01-07: 1000 mg via ORAL
  Filled 2023-01-07: qty 2

## 2023-01-07 MED ORDER — SODIUM CHLORIDE 0.9 % IR SOLN
Status: DC | PRN
  Administered 2023-01-07: 4000 mL

## 2023-01-07 MED ORDER — POVIDONE-IODINE 10 % EX SWAB
2.0000 | Freq: Once | CUTANEOUS | Status: AC
  Administered 2023-01-07: 2 via TOPICAL

## 2023-01-07 MED ORDER — BUPIVACAINE IN DEXTROSE 0.75-8.25 % IT SOLN
INTRATHECAL | Status: DC | PRN
  Administered 2023-01-07: 1.8 mL via INTRATHECAL

## 2023-01-07 MED ORDER — TRANEXAMIC ACID-NACL 1000-0.7 MG/100ML-% IV SOLN
1000.0000 mg | INTRAVENOUS | Status: AC
  Administered 2023-01-07: 1000 mg via INTRAVENOUS
  Filled 2023-01-07: qty 100

## 2023-01-07 MED ORDER — WATER FOR IRRIGATION, STERILE IR SOLN
Status: DC | PRN
  Administered 2023-01-07: 2000 mL

## 2023-01-07 MED ORDER — PHENYLEPHRINE HCL (PRESSORS) 10 MG/ML IV SOLN
INTRAVENOUS | Status: AC
  Filled 2023-01-07: qty 1

## 2023-01-07 MED ORDER — BUPIVACAINE-EPINEPHRINE 0.25% -1:200000 IJ SOLN
INTRAMUSCULAR | Status: DC | PRN
  Administered 2023-01-07: 30 mL

## 2023-01-07 MED ORDER — ISOPROPYL ALCOHOL 70 % SOLN
Status: DC | PRN
  Administered 2023-01-07: 1 via TOPICAL

## 2023-01-07 MED ORDER — POLYETHYLENE GLYCOL 3350 17 G PO PACK: 17.0000 g | PACK | Freq: Every day | ORAL | 0 refills | Status: AC | PRN

## 2023-01-07 MED ORDER — ONDANSETRON HCL 4 MG PO TABS: 4.0000 mg | ORAL_TABLET | Freq: Three times a day (TID) | ORAL | 0 refills | Status: AC | PRN

## 2023-01-07 MED ORDER — ONDANSETRON HCL 4 MG/2ML IJ SOLN
INTRAMUSCULAR | Status: DC | PRN
  Administered 2023-01-07: 4 mg via INTRAVENOUS

## 2023-01-07 MED ORDER — DEXAMETHASONE SODIUM PHOSPHATE 10 MG/ML IJ SOLN
INTRAMUSCULAR | Status: DC | PRN
  Administered 2023-01-07: 8 mg via INTRAVENOUS

## 2023-01-07 MED ORDER — ASPIRIN 81 MG PO CHEW: 81.0000 mg | CHEWABLE_TABLET | Freq: Two times a day (BID) | ORAL | 0 refills | Status: AC

## 2023-01-07 MED ORDER — BUPIVACAINE HCL (PF) 0.25 % IJ SOLN
INTRAMUSCULAR | Status: AC
  Filled 2023-01-07: qty 30

## 2023-01-07 MED ORDER — SODIUM CHLORIDE (PF) 0.9 % IJ SOLN
INTRAMUSCULAR | Status: DC | PRN
  Administered 2023-01-07: 30 mL via INTRAVENOUS

## 2023-01-07 MED ORDER — LACTATED RINGERS IV SOLN
INTRAVENOUS | Status: DC
  Administered 2023-01-07: 1000 mL via INTRAVENOUS

## 2023-01-07 MED ORDER — LACTATED RINGERS IV SOLN: INTRAVENOUS | Status: DC

## 2023-01-07 MED ORDER — KETOROLAC TROMETHAMINE 30 MG/ML IJ SOLN
INTRAMUSCULAR | Status: DC | PRN
  Administered 2023-01-07: 30 mg

## 2023-01-07 MED ORDER — DOCUSATE SODIUM 100 MG PO CAPS: 100.0000 mg | ORAL_CAPSULE | Freq: Two times a day (BID) | ORAL | 0 refills | Status: AC

## 2023-01-07 MED ORDER — FENTANYL CITRATE (PF) 100 MCG/2ML IJ SOLN
INTRAMUSCULAR | Status: DC | PRN
  Administered 2023-01-07: 100 ug via INTRAVENOUS

## 2023-01-07 MED ORDER — PHENYLEPHRINE HCL-NACL 20-0.9 MG/250ML-% IV SOLN
INTRAVENOUS | Status: DC | PRN
  Administered 2023-01-07: 50 ug/min via INTRAVENOUS

## 2023-01-07 MED ORDER — PROPOFOL 500 MG/50ML IV EMUL
INTRAVENOUS | Status: DC | PRN
  Administered 2023-01-07: 60 ug/kg/min via INTRAVENOUS

## 2023-01-07 MED ORDER — KETOROLAC TROMETHAMINE 30 MG/ML IJ SOLN
INTRAMUSCULAR | Status: AC
  Filled 2023-01-07: qty 1

## 2023-01-07 MED ORDER — CEFAZOLIN SODIUM-DEXTROSE 2-4 GM/100ML-% IV SOLN
2.0000 g | INTRAVENOUS | Status: AC
  Administered 2023-01-07: 2 g via INTRAVENOUS
  Filled 2023-01-07: qty 100

## 2023-01-07 MED ORDER — EPINEPHRINE PF 1 MG/ML IJ SOLN
INTRAMUSCULAR | Status: AC
  Filled 2023-01-07: qty 1

## 2023-01-07 MED ORDER — PROPOFOL 10 MG/ML IV BOLUS
INTRAVENOUS | Status: DC | PRN
  Administered 2023-01-07 (×2): 20 mg via INTRAVENOUS

## 2023-01-07 SURGICAL SUPPLY — 56 items
ADH SKN CLS APL DERMABOND .7 (GAUZE/BANDAGES/DRESSINGS) ×2
APL PRP STRL LF DISP 70% ISPRP (MISCELLANEOUS) ×1
BAG COUNTER SPONGE SURGICOUNT (BAG) IMPLANT
BAG DECANTER FOR FLEXI CONT (MISCELLANEOUS) IMPLANT
BAG SPEC THK2 15X12 ZIP CLS (MISCELLANEOUS)
BAG SPNG CNTER NS LX DISP (BAG)
BAG ZIPLOCK 12X15 (MISCELLANEOUS) IMPLANT
CHLORAPREP W/TINT 26 (MISCELLANEOUS) ×2 IMPLANT
COVER PERINEAL POST (MISCELLANEOUS) ×2 IMPLANT
COVER SURGICAL LIGHT HANDLE (MISCELLANEOUS) ×2 IMPLANT
DERMABOND ADVANCED .7 DNX12 (GAUZE/BANDAGES/DRESSINGS) ×4 IMPLANT
DRAPE IMP U-DRAPE 54X76 (DRAPES) ×2 IMPLANT
DRAPE SHEET LG 3/4 BI-LAMINATE (DRAPES) ×6 IMPLANT
DRAPE STERI IOBAN 125X83 (DRAPES) ×2 IMPLANT
DRAPE U-SHAPE 47X51 STRL (DRAPES) ×2 IMPLANT
DRSG AQUACEL AG ADV 3.5X10 (GAUZE/BANDAGES/DRESSINGS) ×2 IMPLANT
ELECT REM PT RETURN 15FT ADLT (MISCELLANEOUS) ×2 IMPLANT
GAUZE SPONGE 4X4 12PLY STRL (GAUZE/BANDAGES/DRESSINGS) ×2 IMPLANT
GLOVE BIO SURGEON STRL SZ7 (GLOVE) ×2 IMPLANT
GLOVE BIO SURGEON STRL SZ8.5 (GLOVE) ×4 IMPLANT
GLOVE BIOGEL PI IND STRL 7.5 (GLOVE) ×2 IMPLANT
GLOVE BIOGEL PI IND STRL 8.5 (GLOVE) ×2 IMPLANT
GOWN SPEC L3 XXLG W/TWL (GOWN DISPOSABLE) ×2 IMPLANT
GOWN STRL REUS W/ TWL XL LVL3 (GOWN DISPOSABLE) ×2 IMPLANT
GOWN STRL REUS W/TWL XL LVL3 (GOWN DISPOSABLE) ×1
HANDPIECE INTERPULSE COAX TIP (DISPOSABLE) ×1
HEAD FEM -3XOFST 36XMDLR (Head) IMPLANT
HEAD MODULAR 36MM (Head) ×1 IMPLANT
HOLDER FOLEY CATH W/STRAP (MISCELLANEOUS) ×2 IMPLANT
HOOD PEEL AWAY T7 (MISCELLANEOUS) ×6 IMPLANT
KIT TURNOVER KIT A (KITS) IMPLANT
LINER ACETAB ~~LOC~~ D 36 (Liner) IMPLANT
MANIFOLD NEPTUNE II (INSTRUMENTS) ×2 IMPLANT
MARKER SKIN DUAL TIP RULER LAB (MISCELLANEOUS) ×2 IMPLANT
NDL SAFETY ECLIP 18X1.5 (MISCELLANEOUS) ×2 IMPLANT
NDL SPNL 18GX3.5 QUINCKE PK (NEEDLE) ×2 IMPLANT
NEEDLE SPNL 18GX3.5 QUINCKE PK (NEEDLE) ×1 IMPLANT
PACK ANTERIOR HIP CUSTOM (KITS) ×2 IMPLANT
SAW OSC TIP CART 19.5X105X1.3 (SAW) ×2 IMPLANT
SEALER BIPOLAR AQUA 6.0 (INSTRUMENTS) ×2 IMPLANT
SET HNDPC FAN SPRY TIP SCT (DISPOSABLE) ×2 IMPLANT
SHELL ACET G7 3H 50 SZD (Shell) IMPLANT
SOLUTION PRONTOSAN WOUND 350ML (IRRIGATION / IRRIGATOR) ×2 IMPLANT
SPIKE FLUID TRANSFER (MISCELLANEOUS) ×2 IMPLANT
STEM FEM CMTLS STD HIP 12 133D (Stem) IMPLANT
SUT MNCRL AB 3-0 PS2 18 (SUTURE) ×2 IMPLANT
SUT MON AB 2-0 CT1 36 (SUTURE) ×2 IMPLANT
SUT STRATAFIX PDO 1 14 VIOLET (SUTURE) ×1
SUT STRATFX PDO 1 14 VIOLET (SUTURE) ×1
SUT VIC AB 2-0 CT1 27 (SUTURE)
SUT VIC AB 2-0 CT1 TAPERPNT 27 (SUTURE) IMPLANT
SUTURE STRATFX PDO 1 14 VIOLET (SUTURE) ×2 IMPLANT
SYR 3ML LL SCALE MARK (SYRINGE) ×2 IMPLANT
TRAY FOLEY MTR SLVR 16FR STAT (SET/KITS/TRAYS/PACK) IMPLANT
TUBE SUCTION HIGH CAP CLEAR NV (SUCTIONS) ×2 IMPLANT
WATER STERILE IRR 1000ML POUR (IV SOLUTION) ×2 IMPLANT

## 2023-01-07 NOTE — Anesthesia Procedure Notes (Signed)
Spinal  Patient location during procedure: OR Start time: 01/07/2023 10:05 AM End time: 01/07/2023 10:08 AM Reason for block: surgical anesthesia Staffing Performed: anesthesiologist  Anesthesiologist: Elmer Picker, MD Performed by: Elmer Picker, MD Authorized by: Elmer Picker, MD   Preanesthetic Checklist Completed: patient identified, IV checked, risks and benefits discussed, surgical consent, monitors and equipment checked, pre-op evaluation and timeout performed Spinal Block Patient position: sitting Prep: DuraPrep and site prepped and draped Patient monitoring: cardiac monitor, continuous pulse ox and blood pressure Approach: midline Location: L3-4 Injection technique: single-shot Needle Needle type: Pencan  Needle gauge: 24 G Needle length: 9 cm Assessment Sensory level: T6 Events: CSF return Additional Notes Functioning IV was confirmed and monitors were applied. Sterile prep and drape, including hand hygiene and sterile gloves were used. The patient was positioned and the spine was prepped. The skin was anesthetized with lidocaine.  Free flow of clear CSF was obtained prior to injecting local anesthetic into the CSF.  The spinal needle aspirated freely following injection.  The needle was carefully withdrawn.  The patient tolerated the procedure well.

## 2023-01-07 NOTE — Discharge Instructions (Signed)

## 2023-01-07 NOTE — Evaluation (Signed)
Physical Therapy Evaluation Patient Details Name: Debra Farmer MRN: [id number] DOB: 1946-05-30 Today's Date: 01/07/2023  History of Present Illness  Pt s/p L THR and with hx of MI, COPD, R THR (19) and back surgery  Clinical Impression  Pt s/p L THR and presents with decreased L LE strength/ROM and post op pain limiting functional mobility.  This date, pt performed HEP with written instruction provided, up to ambulate in hall, reviewed car transfers and negotiated stairs.  Pt very motivated and eager for dc home this date.      Assistance Recommended at Discharge Intermittent Supervision/Assistance  If plan is discharge home, recommend the following:  Can travel by private vehicle  A little help with walking and/or transfers;A little help with bathing/dressing/bathroom;Assistance with cooking/housework;Assist for transportation;Help with stairs or ramp for entrance        Equipment Recommendations None recommended by PT  Recommendations for Other Services       Functional Status Assessment Patient has had a recent decline in their functional status and demonstrates the ability to make significant improvements in function in a reasonable and predictable amount of time.     Precautions / Restrictions Precautions Precautions: Fall Restrictions Weight Bearing Restrictions: No Other Position/Activity Restrictions: WBAT      Mobility  Bed Mobility Overal bed mobility: Needs Assistance Bed Mobility: Supine to Sit, Sit to Supine     Supine to sit: Supervision Sit to supine: Supervision   General bed mobility comments: for safety only    Transfers Overall transfer level: Needs assistance Equipment used: Rolling walker (2 wheels) Transfers: Sit to/from Stand Sit to Stand: Min assist, Min guard           General transfer comment: Cues for LE management and use of UEs to self assist    Ambulation/Gait Ambulation/Gait assistance: Min assist, Min guard Gait  Distance (Feet): 150 Feet Assistive device: Rolling walker (2 wheels) Gait Pattern/deviations: Step-to pattern, Step-through pattern, Decreased step length - right, Decreased step length - left, Shuffle, Trunk flexed       General Gait Details: cues for posture, position from RW and initial sequence  Stairs Stairs: Yes Stairs assistance: Min assist Stair Management: Two rails, Step to pattern, Forwards Number of Stairs: 3 General stair comments: cues for sequence  Wheelchair Mobility     Tilt Bed    Modified Rankin (Stroke Patients Only)       Balance Overall balance assessment: Needs assistance Sitting-balance support: No upper extremity supported, Feet supported Sitting balance-Leahy Scale: Good     Standing balance support: Single extremity supported Standing balance-Leahy Scale: Fair                               Pertinent Vitals/Pain Pain Assessment Pain Assessment: 0-10 Pain Score: 4  Pain Location: L hip Pain Descriptors / Indicators: Aching, Burning Pain Intervention(s): Limited activity within patient's tolerance, Monitored during session, Premedicated before session, Ice applied    Home Living Family/patient expects to be discharged to:: Private residence Living Arrangements: Spouse/significant other Available Help at Discharge: Family;Available PRN/intermittently Type of Home: House Home Access: Stairs to enter Entrance Stairs-Rails: Left;Right;Can reach both Entrance Stairs-Number of Steps: 3   Home Layout: One level Home Equipment: Shower seat - built in;Grab bars - tub/shower;Cane - single Librarian, academic (2 wheels)      Prior Function Prior Level of Function : Independent/Modified Independent;Driving  Hand Dominance   Dominant Hand: Right    Extremity/Trunk Assessment   Upper Extremity Assessment Upper Extremity Assessment: Overall WFL for tasks assessed    Lower Extremity  Assessment Lower Extremity Assessment: LLE deficits/detail LLE Deficits / Details: AAROM at hip to 20 abd and 90 flex    Cervical / Trunk Assessment Cervical / Trunk Assessment: Normal  Communication   Communication: No difficulties  Cognition Arousal/Alertness: Awake/alert Behavior During Therapy: WFL for tasks assessed/performed Overall Cognitive Status: Within Functional Limits for tasks assessed                                          General Comments      Exercises Total Joint Exercises Ankle Circles/Pumps: AROM, Both, 15 reps, Supine Quad Sets: AROM, Both, 10 reps, Supine Heel Slides: AAROM, Left, 15 reps, Supine Hip ABduction/ADduction: AAROM, Left, 15 reps, Supine Long Arc Quad: AROM, Left, 10 reps, Seated   Assessment/Plan    PT Assessment Patient needs continued PT services  PT Problem List Decreased strength;Decreased range of motion;Decreased activity tolerance;Decreased balance;Decreased mobility;Decreased knowledge of use of DME;Pain       PT Treatment Interventions DME instruction;Gait training;Stair training;Functional mobility training;Therapeutic activities;Therapeutic exercise;Balance training;Patient/family education    PT Goals (Current goals can be found in the Care Plan section)  Acute Rehab PT Goals Patient Stated Goal: Regain IND PT Goal Formulation: All assessment and education complete, DC therapy    Frequency Min 1X/week     Co-evaluation               AM-PAC PT "6 Clicks" Mobility  Outcome Measure Help needed turning from your back to your side while in a flat bed without using bedrails?: A Little Help needed moving from lying on your back to sitting on the side of a flat bed without using bedrails?: A Little Help needed moving to and from a bed to a chair (including a wheelchair)?: A Little Help needed standing up from a chair using your arms (e.g., wheelchair or bedside chair)?: A Little Help needed to walk in  hospital room?: A Little Help needed climbing 3-5 steps with a railing? : A Little 6 Click Score: 18    End of Session Equipment Utilized During Treatment: Gait belt Activity Tolerance: Patient tolerated treatment well Patient left: in bed;with call bell/phone within reach;with family/visitor present Nurse Communication: Mobility status PT Visit Diagnosis: Difficulty in walking, not elsewhere classified (R26.2)    Time: 1610-9604 PT Time Calculation (min) (ACUTE ONLY): 40 min   Charges:   PT Evaluation $PT Eval Low Complexity: 1 Low PT Treatments $Gait Training: 8-22 mins $Therapeutic Exercise: 8-22 mins PT General Charges $$ ACUTE PT VISIT: 1 Visit         Mauro Kaufmann PT Acute Rehabilitation Services Pager [phone] Office [phone]   Ralonda Tartt 01/07/2023, 4:34 PM

## 2023-01-07 NOTE — Op Note (Signed)
OPERATIVE REPORT  SURGEON: Samson Frederic, MD   ASSISTANT: Clint Bolder, PA-C.  PREOPERATIVE DIAGNOSIS: Left hip arthritis.   POSTOPERATIVE DIAGNOSIS: Left hip arthritis.   PROCEDURE: Left total hip arthroplasty, anterior approach.   IMPLANTS: Biomet Taperloc Complete Microplasty stem, size 12 x 109 mm, standard offset. Biomet G7 OsseoTi Cup, size 50 mm. Biomet Vivacit-E liner, size 36 mm, D, neutral. Biomet metal head ball, size 36 - 3 mm.  ANESTHESIA:  MAC and Spinal  ESTIMATED BLOOD LOSS:-200 mL    ANTIBIOTICS: 2g Ancef.  DRAINS: None.  COMPLICATIONS: None.   CONDITION: PACU - hemodynamically stable.   BRIEF CLINICAL NOTE: Debra Farmer is a 77 y.o. female with a long-standing history of Left hip arthritis. After failing conservative management, the patient was indicated for total hip arthroplasty. The risks, benefits, and alternatives to the procedure were explained, and the patient elected to proceed.  PROCEDURE IN DETAIL: Surgical site was marked by myself in the pre-op holding area. Once inside the operating room, spinal anesthesia was obtained, and a foley catheter was inserted. The patient was then positioned on the Hana table.  All bony prominences were well padded.  The hip was prepped and draped in the normal sterile surgical fashion.  A time-out was called verifying side and site of surgery. The patient received IV antibiotics within 60 minutes of beginning the procedure.   Bikini incision was made, and superficial dissection was performed lateral to the ASIS. The direct anterior approach to the hip was performed through the Hueter interval.  Lateral femoral circumflex vessels were treated with the Auqumantys. The anterior capsule was exposed and an inverted T capsulotomy was made. The femoral neck cut was made to the level of the templated cut.  A corkscrew was placed into the head and the head was removed.  The femoral head was found to have eburnated bone.  The head was passed to the back table and was measured. Pubofemoral ligament was released off of the calcar, taking care to stay on bone. Superior capsule was released from the greater trochanter, taking care to stay lateral to the posterior border of the femoral neck in order to preserve the short external rotators.   Acetabular exposure was achieved, and the pulvinar and labrum were excised. Sequential reaming of the acetabulum was then performed up to a size 49 mm reamer. A 50 mm cup was then opened and impacted into place at approximately 40 degrees of abduction and 20 degrees of anteversion. The final polyethylene liner was impacted into place and acetabular osteophytes were removed.    I then gained femoral exposure taking care to protect the abductors and greater trochanter.  This was performed using standard external rotation, extension, and adduction.  A cookie cutter was used to enter the femoral canal, and then the femoral canal finder was placed.  Sequential broaching was performed up to a size 12.  Calcar planer was used on the femoral neck remnant.  I placed a standard offset neck and a trial head ball.  The hip was reduced.  Leg lengths and offset were checked fluoroscopically.  The hip was dislocated and trial components were removed.  The final implants were placed, and the hip was reduced.  Fluoroscopy was used to confirm component position and leg lengths.  At 90 degrees of external rotation and full extension, the hip was stable to an anterior directed force.   The wound was copiously irrigated with Prontosan solution and normal saline using pule lavage.  Marcaine  solution was injected into the periarticular soft tissue.  The wound was closed in layers using #1 Vicryl and V-Loc for the fascia, 2-0 Vicryl for the subcutaneous fat, 2-0 Monocryl for the deep dermal layer, and staples + Dermabond for the skin.  Once the glue was fully dried, an Aquacell Ag dressing was applied.  The patient was  transported to the recovery room in stable condition.  Sponge, needle, and instrument counts were correct at the end of the case x2.  The patient tolerated the procedure well and there were no known complications.  The aquamantis was utilized for this case to help facilitate better hemostasis as patient was felt to be at increased risk of bleeding because of complex case requiring increased OR time and/or exposure - minimally invasive approach.  A oscillating saw tip was utilized for this case to prevent damage to the soft tissue structures such as muscles, ligaments and tendons, and to ensure accurate bone cuts. This patient was at increased risk for above structures due to minimally invasive approach.  Please note that a surgical assistant was a medical necessity for this procedure to perform it in a safe and expeditious manner. Assistant was necessary to provide appropriate retraction of vital neurovascular structures, to prevent femoral fracture, and to allow for anatomic placement of the prosthesis.

## 2023-01-07 NOTE — Interval H&P Note (Signed)
History and Physical Interval Note:  01/07/2023 9:06 AM  Debra Farmer  has presented today for surgery, with the diagnosis of Left hip osteoarthritis.  The various methods of treatment have been discussed with the patient and family. After consideration of risks, benefits and other options for treatment, the patient has consented to  Procedure(s) with comments: TOTAL HIP ARTHROPLASTY ANTERIOR APPROACH (Left) - 120 as a surgical intervention.  The patient's history has been reviewed, patient examined, no change in status, stable for surgery.  I have reviewed the patient's chart and labs.  Questions were answered to the patient's satisfaction.     [name]

## 2023-01-07 NOTE — Anesthesia Preprocedure Evaluation (Addendum)
Anesthesia Evaluation  Patient identified by MRN, date of birth, ID band Patient awake    Reviewed: Allergy & Precautions, NPO status , Patient's Chart, lab work & pertinent test results  Airway Mallampati: II  TM Distance: >3 FB Neck ROM: Full    Dental  (+) Edentulous Upper, Edentulous Lower, Upper Dentures, Lower Dentures, Dental Advisory Given   Pulmonary COPD, former smoker   Pulmonary exam normal breath sounds clear to auscultation       Cardiovascular + CAD, + Past MI, + Cardiac Stents and + Peripheral Vascular Disease  Normal cardiovascular exam Rhythm:Regular Rate:Normal  TTE 2022 1. Left ventricular ejection fraction, by estimation, is 55 to 60%. The  left ventricle has normal function. The left ventricle has no regional  wall motion abnormalities. There is mild concentric left ventricular  hypertrophy. Left ventricular diastolic  parameters are consistent with Grade I diastolic dysfunction (impaired  relaxation).   2. Right ventricular systolic function is normal. The right ventricular  size is normal. Mildly increased right ventricular wall thickness.   3. Left atrial size was mildly dilated.   4. Right atrial size was mildly dilated.   5. The mitral valve is degenerative. Mild mitral valve regurgitation.   6. The aortic valve is tricuspid. There is moderate calcification of the  aortic valve. There is moderate thickening of the aortic valve. Aortic  valve regurgitation is mild. Mild aortic valve stenosis.   7. There is mild (Grade II) atheroma plaque involving the aortic root and  ascending aorta.   8. The inferior vena cava is normal in size with greater than 50%  respiratory variability, suggesting right atrial pressure of 3 mmHg.     Neuro/Psych  Headaches  negative psych ROS   GI/Hepatic ,GERD  ,,(+) Hepatitis -, A  Endo/Other  negative endocrine ROS    Renal/GU negative Renal ROS  negative  genitourinary   Musculoskeletal  (+) Arthritis , Rheumatoid disorders,    Abdominal   Peds  Hematology negative hematology ROS (+)   Anesthesia Other Findings On chronic steroids  Reproductive/Obstetrics                             Anesthesia Physical Anesthesia Plan  ASA: 3  Anesthesia Plan: Spinal   Post-op Pain Management: Tylenol PO (pre-op)*   Induction:   PONV Risk Score and Plan: 2 and Treatment may vary due to age or medical condition, Dexamethasone, Propofol infusion and Ondansetron  Airway Management Planned: Natural Airway  Additional Equipment:   Intra-op Plan:   Post-operative Plan:   Informed Consent: I have reviewed the patients History and Physical, chart, labs and discussed the procedure including the risks, benefits and alternatives for the proposed anesthesia with the patient or authorized representative who has indicated his/her understanding and acceptance.     Dental advisory given  Plan Discussed with: CRNA  Anesthesia Plan Comments:        Anesthesia Quick Evaluation

## 2023-01-07 NOTE — Anesthesia Postprocedure Evaluation (Signed)
Anesthesia Post Note  Patient: Debra Farmer  Procedure(s) Performed: TOTAL HIP ARTHROPLASTY ANTERIOR APPROACH (Left: Hip)     Patient location during evaluation: PACU Anesthesia Type: Spinal Level of consciousness: oriented and awake and alert Pain management: pain level controlled Vital Signs Assessment: post-procedure vital signs reviewed and stable Respiratory status: spontaneous breathing, respiratory function stable and patient connected to nasal cannula oxygen Cardiovascular status: blood pressure returned to baseline and stable Postop Assessment: no headache, no backache and no apparent nausea or vomiting Anesthetic complications: no  No notable events documented.  Last Vitals:  Vitals:   01/07/23 1245 01/07/23 1300  BP: (!) 126/54 124/82  Pulse: (!) 51 (!) 56  Resp: 12 18  Temp: 36.4 C   SpO2: 95% 94%    Last Pain:  Vitals:   01/07/23 1245  TempSrc:   PainSc: 0-No pain                 Enza Shone L Daud Cayer

## 2023-01-07 NOTE — Care Plan (Signed)
Ortho Bundle Case Management Note  Patient Details  Name: Debra Farmer MRN: [id number] Date of Birth: April 27, 1946                  L THA on 01/07/23.  DCP: Home with husband.  DME: No needs. Has RW.  PT: [facility]   DME Arranged:  N/A DME Agency:       Additional Comments: Please contact me with any questions of if this plan should need to change.    Despina Pole, Case Manager  [facility]  [phone] 01/07/2023, 8:23 AM

## 2023-01-07 NOTE — Transfer of Care (Signed)
Immediate Anesthesia Transfer of Care Note  Patient: LEEANN BADY  Procedure(s) Performed: TOTAL HIP ARTHROPLASTY ANTERIOR APPROACH (Left: Hip)  Patient Location: PACU  Anesthesia Type:Spinal  Level of Consciousness: sedated  Airway & Oxygen Therapy: Patient Spontanous Breathing and Patient connected to face mask oxygen  Post-op Assessment: Report given to RN and Post -op Vital signs reviewed and stable  Post vital signs: Reviewed and stable  Last Vitals:  Vitals Value Taken Time  BP 115/61 01/07/23 1147  Temp    Pulse 54 01/07/23 1147  Resp 7 01/07/23 1147  SpO2 100 % 01/07/23 1147  Vitals shown include unfiled device data.  Last Pain:  Vitals:   01/07/23 0802  TempSrc: Oral  PainSc:       Patients Stated Pain Goal: 0 (01/07/23 0801)  Complications: No notable events documented.

## 2023-01-08 ENCOUNTER — Encounter (HOSPITAL_COMMUNITY): Payer: Self-pay | Admitting: Orthopedic Surgery

## 2023-01-25 DIAGNOSIS — Z96642 Presence of left artificial hip joint: Secondary | ICD-10-CM | POA: Diagnosis not present

## 2023-01-25 DIAGNOSIS — Z471 Aftercare following joint replacement surgery: Secondary | ICD-10-CM | POA: Diagnosis not present

## 2023-01-27 ENCOUNTER — Other Ambulatory Visit (HOSPITAL_COMMUNITY): Payer: PPO

## 2023-02-22 DIAGNOSIS — Z471 Aftercare following joint replacement surgery: Secondary | ICD-10-CM | POA: Diagnosis not present

## 2023-02-22 DIAGNOSIS — Z96642 Presence of left artificial hip joint: Secondary | ICD-10-CM | POA: Diagnosis not present

## 2023-02-25 ENCOUNTER — Emergency Department (HOSPITAL_COMMUNITY): Payer: PPO

## 2023-02-25 ENCOUNTER — Emergency Department (HOSPITAL_COMMUNITY)
Admission: EM | Admit: 2023-02-25 | Discharge: 2023-02-25 | Disposition: A | Discharge status: Home / Self Care | Payer: PPO | Attending: Emergency Medicine | Admitting: Emergency Medicine

## 2023-02-25 ENCOUNTER — Encounter (HOSPITAL_COMMUNITY): Payer: Self-pay

## 2023-02-25 DIAGNOSIS — W182XXA Fall in (into) shower or empty bathtub, initial encounter: Secondary | ICD-10-CM | POA: Insufficient documentation

## 2023-02-25 DIAGNOSIS — I2510 Atherosclerotic heart disease of native coronary artery without angina pectoris: Secondary | ICD-10-CM | POA: Insufficient documentation

## 2023-02-25 DIAGNOSIS — Z859 Personal history of malignant neoplasm, unspecified: Secondary | ICD-10-CM | POA: Insufficient documentation

## 2023-02-25 DIAGNOSIS — Z7951 Long term (current) use of inhaled steroids: Secondary | ICD-10-CM | POA: Diagnosis not present

## 2023-02-25 DIAGNOSIS — S73005A Unspecified dislocation of left hip, initial encounter: Secondary | ICD-10-CM | POA: Diagnosis not present

## 2023-02-25 DIAGNOSIS — R011 Cardiac murmur, unspecified: Secondary | ICD-10-CM | POA: Insufficient documentation

## 2023-02-25 DIAGNOSIS — R609 Edema, unspecified: Secondary | ICD-10-CM | POA: Diagnosis not present

## 2023-02-25 DIAGNOSIS — I10 Essential (primary) hypertension: Secondary | ICD-10-CM | POA: Diagnosis not present

## 2023-02-25 DIAGNOSIS — T84021D Dislocation of internal left hip prosthesis, subsequent encounter: Secondary | ICD-10-CM | POA: Diagnosis not present

## 2023-02-25 DIAGNOSIS — Z96643 Presence of artificial hip joint, bilateral: Secondary | ICD-10-CM | POA: Diagnosis not present

## 2023-02-25 DIAGNOSIS — J449 Chronic obstructive pulmonary disease, unspecified: Secondary | ICD-10-CM | POA: Diagnosis not present

## 2023-02-25 DIAGNOSIS — S79912A Unspecified injury of left hip, initial encounter: Secondary | ICD-10-CM | POA: Diagnosis present

## 2023-02-25 DIAGNOSIS — R202 Paresthesia of skin: Secondary | ICD-10-CM | POA: Diagnosis not present

## 2023-02-25 DIAGNOSIS — M25559 Pain in unspecified hip: Secondary | ICD-10-CM | POA: Diagnosis not present

## 2023-02-25 DIAGNOSIS — T84021A Dislocation of internal left hip prosthesis, initial encounter: Secondary | ICD-10-CM | POA: Diagnosis not present

## 2023-02-25 DIAGNOSIS — M8580 Other specified disorders of bone density and structure, unspecified site: Secondary | ICD-10-CM | POA: Diagnosis not present

## 2023-02-25 LAB — CBC WITH DIFFERENTIAL/PLATELET
Abs Immature Granulocytes: 0.06 10*3/uL (ref 0.00–0.07)
Basophils Absolute: 0 10*3/uL (ref 0.0–0.1)
Basophils Relative: 0 %
Eosinophils Absolute: 0.1 10*3/uL (ref 0.0–0.5)
Eosinophils Relative: 1 %
HCT: 41.6 % (ref 36.0–46.0)
Hemoglobin: 13 g/dL (ref 12.0–15.0)
Immature Granulocytes: 1 %
Lymphocytes Relative: 16 %
Lymphs Abs: 1.6 10*3/uL (ref 0.7–4.0)
MCH: 29.1 pg (ref 26.0–34.0)
MCHC: 31.3 g/dL (ref 30.0–36.0)
MCV: 93.3 fL (ref 80.0–100.0)
Monocytes Absolute: 0.6 10*3/uL (ref 0.1–1.0)
Monocytes Relative: 7 %
Neutro Abs: 7.3 10*3/uL (ref 1.7–7.7)
Neutrophils Relative %: 75 %
Platelets: 278 10*3/uL (ref 150–400)
RBC: 4.46 MIL/uL (ref 3.87–5.11)
RDW: 13.2 % (ref 11.5–15.5)
WBC: 9.7 10*3/uL (ref 4.0–10.5)
nRBC: 0 % (ref 0.0–0.2)

## 2023-02-25 LAB — PROTIME-INR
INR: 0.9 (ref 0.8–1.2)
Prothrombin Time: 12.7 s (ref 11.4–15.2)

## 2023-02-25 LAB — BASIC METABOLIC PANEL
Anion gap: 9 (ref 5–15)
BUN: 13 mg/dL (ref 8–23)
CO2: 25 mmol/L (ref 22–32)
Calcium: 9 mg/dL (ref 8.9–10.3)
Chloride: 104 mmol/L (ref 98–111)
Creatinine, Ser: 0.62 mg/dL (ref 0.44–1.00)
GFR, Estimated: >60 mL/min (ref >60)
Glucose, Bld: 102 mg/dL — ABNORMAL HIGH (ref 70–99)
Potassium: 3.7 mmol/L (ref 3.5–5.1)
Sodium: 138 mmol/L (ref 135–145)

## 2023-02-25 LAB — TYPE AND SCREEN
ABO/RH(D): O POS
Antibody Screen: NEGATIVE

## 2023-02-25 MED ORDER — HYDROMORPHONE HCL 1 MG/ML IJ SOLN
1.0000 mg | Freq: Once | INTRAMUSCULAR | Status: AC
  Administered 2023-02-25: 1 mg via INTRAVENOUS
  Filled 2023-02-25: qty 1

## 2023-02-25 MED ORDER — ETOMIDATE 2 MG/ML IV SOLN
5.0000 mg | Freq: Once | INTRAVENOUS | Status: AC
  Administered 2023-02-25: 5 mg via INTRAVENOUS
  Filled 2023-02-25: qty 10

## 2023-02-25 MED ORDER — OXYCODONE HCL 5 MG PO TABS
5.0000 mg | ORAL_TABLET | ORAL | 0 refills | Status: DC | PRN
Start: 2023-02-25 — End: 2023-06-23

## 2023-02-25 MED ORDER — ETOMIDATE 2 MG/ML IV SOLN: 5.0000 mg | Freq: Once | INTRAVENOUS | Status: DC

## 2023-02-25 MED ORDER — FENTANYL CITRATE PF 50 MCG/ML IJ SOSY
100.0000 ug | PREFILLED_SYRINGE | Freq: Once | INTRAMUSCULAR | Status: DC
  Filled 2023-02-25: qty 2

## 2023-02-25 MED ORDER — FENTANYL CITRATE PF 50 MCG/ML IJ SOSY
50.0000 ug | PREFILLED_SYRINGE | INTRAMUSCULAR | Status: AC | PRN
  Administered 2023-02-25 (×2): 50 ug via INTRAVENOUS
  Filled 2023-02-25 (×2): qty 1

## 2023-02-25 MED ORDER — SODIUM CHLORIDE 0.9 % IV BOLUS
500.0000 mL | Freq: Once | INTRAVENOUS | Status: AC
  Administered 2023-02-25: 500 mL via INTRAVENOUS

## 2023-02-25 MED ORDER — FENTANYL CITRATE PF 50 MCG/ML IJ SOSY
50.0000 ug | PREFILLED_SYRINGE | Freq: Once | INTRAMUSCULAR | Status: AC
  Administered 2023-02-25: 50 ug via INTRAVENOUS

## 2023-02-25 MED ORDER — ONDANSETRON HCL 4 MG/2ML IJ SOLN
4.0000 mg | Freq: Once | INTRAMUSCULAR | Status: AC
  Administered 2023-02-25: 4 mg via INTRAVENOUS
  Filled 2023-02-25: qty 2

## 2023-02-25 NOTE — ED Provider Notes (Signed)
[facility] Provider Note   CSN: [id number] Arrival date & time: 02/25/23  1006     History  No chief complaint on file.   Debra Farmer is a 77 y.o. female.  The history is provided by the patient, the EMS personnel and medical records. No language interpreter was used.     Debra Farmer is a 78 y.o. female with a past medical history significant for CAD, MI with placement of coronary artery stents, on chronic aspirin therapy, rheumatoid arthritis, and s/p L hip replacement on 01/07/2023 who presents to the ED with L hip pain after slipping in the shower. Patient states she was lifting her left leg up to wash it when it slipped out to the side, causing immediate severe pain. Her husband had to assist her out of the shower. She was brought here by EMS. She is unable to put weight on the left without experiencing severe pain. She denies any significant weakness or loss of sensation in her lower extremities. Endorses numbness in both feet, but says this is baseline for her.   Home Medications Prior to Admission medications   Medication Sig Start Date End Date Taking? Authorizing Provider  furosemide (LASIX) 20 MG tablet TAKE 1-2 TABLETS BY MOUTH DAILY AS NEEDED FOR EDEMA Patient taking differently: Take 20 mg by mouth daily as needed for edema. 03/04/22   Plotnikov, Georgina Quint, MD  linaclotide (LINZESS) 290 MCG CAPS capsule Take 1 capsule (290 mcg total) by mouth daily. Patient taking differently: Take 290 mcg by mouth daily as needed (constipation). 11/11/21   Plotnikov, Georgina Quint, MD  Multiple Vitamins-Minerals (IMMUNE SUPPORT PO) Take 1 tablet by mouth daily.    [provider]  nitroGLYCERIN (NITROSTAT) 0.4 MG SL tablet Place 1 tablet (0.4 mg total) under the tongue every 5 (five) minutes x 3 doses as needed for chest pain. 07/21/20   Orpah Cobb, MD  ondansetron (ZOFRAN) 4 MG tablet Take 1 tablet (4 mg total) by mouth every 8  (eight) hours as needed for nausea or vomiting. 01/07/23 01/07/24  Clois Dupes, PA-C  phentermine 37.5 MG capsule Take 1 capsule (37.5 mg total) by mouth every morning. 07/29/22   Plotnikov, Georgina Quint, MD  potassium chloride (KLOR-CON M) 10 MEQ tablet Take 1 tablet (10 mEq total) by mouth daily. Patient taking differently: Take 10 mEq by mouth daily as needed (when taking furosemide). 11/30/22   Plotnikov, Georgina Quint, MD  predniSONE (DELTASONE) 1 MG tablet Take 4 mg by mouth daily with breakfast.    [provider]  zolpidem (AMBIEN) 10 MG tablet TAKE 1 TABLET BY MOUTH EACH NIGHT AT BEDTIME FOR SLEEP 10/05/22   Plotnikov, Georgina Quint, MD      Allergies    Aspirin, Codeine sulfate, Diltiazem, Gabapentin, Lipitor [atorvastatin], Methotrexate, Miralax [polyethylene glycol], Nabumetone, Plaquenil [hydroxychloroquine], Pravastatin, Sarilumab, Senna, and Statins    Review of Systems   Review of Systems  All other systems reviewed and are negative.   Physical Exam Updated Vital Signs BP (!) 155/63 (BP Location: Left Arm)   Pulse 69   Temp 97.6 F (36.4 C) (Oral)   Resp 20   SpO2 97%  Physical Exam Vitals and nursing note reviewed.  Constitutional:      General: She is not in acute distress.    Appearance: She is well-developed.  HENT:     Head: Atraumatic.  Eyes:     Conjunctiva/sclera: Conjunctivae normal.  Cardiovascular:  Rate and Rhythm: Normal rate and regular rhythm.     Pulses: Normal pulses.     Heart sounds: Murmur heard.  Pulmonary:     Effort: Pulmonary effort is normal.     Breath sounds: Normal breath sounds. No wheezing, rhonchi or rales.  Abdominal:     Palpations: Abdomen is soft.     Tenderness: There is no abdominal tenderness.  Musculoskeletal:        General: Tenderness (Left hip: Tenderness about the hip and pelvis with decreased range of motion.  Left leg is shortened and externally rotated.  Intact pedal pulses.) present.     Cervical back: Neck  supple.  Skin:    Findings: No rash.  Neurological:     Mental Status: She is alert and oriented to person, place, and time.  Psychiatric:        Mood and Affect: Mood normal.     ED Results / Procedures / Treatments   Labs (all labs ordered are listed, but only abnormal results are displayed) Labs Reviewed  BASIC METABOLIC PANEL - Abnormal; Notable for the following components:      Result Value   Glucose, Bld 102 (*)    All other components within normal limits  CBC WITH DIFFERENTIAL/PLATELET  PROTIME-INR  TYPE AND SCREEN    EKG EKG Interpretation Date/Time:  Thursday February 25 2023 11:34:21 EDT Ventricular Rate:  68 PR Interval:  169 QRS Duration:  72 QT Interval:  417 QTC Calculation: 444 R Axis:   49  Text Interpretation: Sinus rhythm Borderline T abnormalities, anterior leads Confirmed by Fulton Reek [phone]) on 02/25/2023 11:42:39 AM  Radiology DG Pelvis 1-2 Views  Result Date: 02/25/2023 CLINICAL DATA:  Left hip dislocation EXAM: PELVIS - 1 VIEW COMPARISON:  X-ray 01/07/2023 FINDINGS: Left hip hemiarthroplasty with Press-Fit components. The femoral component is dislocated lateral and there is foreshortening. No separate fracture. Right hip arthroplasty identified with screw fixated acetabular cup and Press-Fit femoral component. Stable alignment on the right. Presumed vascular calcifications in the pelvis. IMPRESSION: Dislocation of the left hip hemiarthroplasty. Electronically Signed   By: Karen Kays M.D.   On: 02/25/2023 13:16    Procedures .Ortho Injury Treatment  Date/Time: 02/25/2023 2:54 PM  Performed by: Fayrene Helper, PA-C Authorized by: Fayrene Helper, PA-C   Consent:    Consent obtained:  Written   Consent given by:  Patient   Risks discussed:  Recurrent dislocation and irreducible dislocation   Alternatives discussed:  No treatment, referral and delayed treatmentInjury location: hip Location details: left hip Injury type:  dislocation Dislocation type: posterior Spontaneous dislocation: yes Prosthesis: yes Pre-procedure neurovascular assessment: neurovascularly intact Pre-procedure range of motion: reduced  Anesthesia: Local anesthesia used: no  Patient sedated: Yes. Refer to sedation procedure documentation for details of sedation. Manipulation performed: yes Reduction method: traction and counter traction Reduction successful: no Post-procedure neurovascular assessment: post-procedure neurovascularly intact Post-procedure distal perfusion: normal Post-procedure neurological function: normal Post-procedure range of motion: unchanged       Medications Ordered in ED Medications  fentaNYL (SUBLIMAZE) injection 100 mcg (has no administration in time range)  fentaNYL (SUBLIMAZE) injection 50 mcg (50 mcg Intravenous Given 02/25/23 1443)  ondansetron (ZOFRAN) injection 4 mg (4 mg Intravenous Given 02/25/23 1101)  HYDROmorphone (DILAUDID) injection 1 mg (1 mg Intravenous Given 02/25/23 1257)  etomidate (AMIDATE) injection 5 mg (5 mg Intravenous Given 02/25/23 1437)  ondansetron (ZOFRAN) injection 4 mg (4 mg Intravenous Given 02/25/23 1431)  sodium chloride 0.9 % bolus 500 mL (  500 mLs Intravenous New Bag/Given 02/25/23 1432)    ED Course/ Medical Decision Making/ A&P Clinical Course as of 02/25/23 1524  Thu Feb 25, 2023  1515 Reduced hip by Ortho, FU on repeat xray, knee imm, ambulate, dc home [BH]    Clinical Course User Index [BH] Henderly, Britni A, PA-C                                 Medical Decision Making Amount and/or Complexity of Data Reviewed Labs: ordered. Radiology: ordered.  Risk Prescription drug management.   BP (!) 155/63 (BP Location: Left Arm)   Pulse 69   Temp 97.6 F (36.4 C) (Oral)   Resp 20   SpO2 97%   66:20 AM 77 year old female significant history of CAD, COPD, cancer, arthritis, bilateral total hip replacement with left hip replaced on 01/07/2023 by orthopedist  Dr. Linna Caprice presenting today via EMS from home for evaluation of hip pain.  Patient states she was showering and while she picked up her left feet to wash herself her leg slipped and she felt immediate pain to her left hip.  Pain is sharp stabbing severe she was unable to move or bear weight afterward.  She did not fall to the ground and denies hitting her head or loss of consciousness.  Husband was able to contact EMS and patient was brought here for further care.  At this time she endorses moderate pain about her left hip.  No headache no neck pain no precipitating symptoms prior to the injury.  She denies any new numbness but states that she has chronic numbness to her hands and feet.  No prior history of hip dislocation.  Her last meal was last night.  She is not on any blood thinner medication  On exam this is an elderly female laying in bed in no significant discomfort.  Heart with normal rate and rhythm and with a systolic murmur, lungs are clear to auscultation abdomen is soft nontender tenderness noted to left hip and pelvic region with decreased hip range of motion and left leg is shortened and externally rotated.  Pedal pulse palpable.  Sensation diminished to bilateral feet but this is chronic.  1:39 PM Please note multiple attempts was made to reach out to orthopedic specialist but have not heard back from them yet.  Will continue to reach out.  Patient still having moderate amount of pain requiring multiple doses of opiate pain medication  -Labs ordered, independently viewed and interpreted by me.  Labs remarkable for reassuring labs -The patient was maintained on a cardiac monitor.  I personally viewed and interpreted the cardiac monitored which showed an underlying rhythm of: NSR -Imaging independently viewed and interpreted by me and I agree with radiologist's interpretation.  Result remarkable for L hip dislocation -This patient presents to the ED for concern of hip injury, this involves  an extensive number of treatment options, and is a complaint that carries with it a high risk of complications and morbidity.  The differential diagnosis includes dislocation, fx, strain, sprain, contusion -Co morbidities that complicate the patient evaluation includes recent total hip replacement -Treatment includes opiate pain medication -Reevaluation of the patient after these medicines showed that the patient improved -PCP office notes or outside notes reviewed -Discussion with specialist orthopedist Dr. Linna Caprice who request hip reduction in the ER -Escalation to admission/observation considered: dispo pending  3:25 PM Initial attempt to reduce her left hip  unconscious sedation was unsuccessful.  Orthopedist Dr. Linna Caprice was able to perform another procedure and sedation and reduction in the ER with success.  At this time patient signed out to oncoming provider who will monitor patient ensure patient can ambulate prior to discharge.         Final Clinical Impression(s) / ED Diagnoses Final diagnoses:  Dislocation of left hip, initial encounter [facility])    Rx / DC Orders ED Discharge Orders     None         Fayrene Helper, PA-C 02/25/23 1526    Laurence Spates, MD 02/25/23 [id number]

## 2023-02-25 NOTE — ED Notes (Signed)
Pt tx to Xray with RN and monitor.

## 2023-02-25 NOTE — Progress Notes (Signed)
Orthopedic Tech Progress Note Patient Details:  Debra Farmer 06/23/1945 [id number]  Ortho Devices Type of Ortho Device: Knee Immobilizer Ortho Device/Splint Location: left Ortho Device/Splint Interventions: Ordered, Application, Adjustment   Post Interventions Patient Tolerated: Fair Instructions Provided: Adjustment of device, Care of device  Kizzie Fantasia 02/25/2023, 3:19 PM

## 2023-02-25 NOTE — ED Provider Notes (Signed)
Care assumed from provider at shift change.  See note for full HPI.  In summation 77 year old recent left hip replacement by Dr. Linna Caprice 6 weeks ago here for evaluation of dislocation.  Had successful reduction.  Plan was to observe, ambulate with knee immobilizer.  Will have her follow-up outpatient Physical Exam  BP (!) 150/88   Pulse 82   Temp 98 F (36.7 C) (Oral)   Resp 19   SpO2 100%   Physical Exam Vitals and nursing note reviewed.  Constitutional:      General: She is not in acute distress.    Appearance: She is well-developed. She is not ill-appearing.  HENT:     Head: Atraumatic.  Eyes:     Pupils: Pupils are equal, round, and reactive to light.  Cardiovascular:     Rate and Rhythm: Normal rate.     Pulses:          Dorsalis pedis pulses are 2+ on the right side and 2+ on the left side.  Pulmonary:     Effort: No respiratory distress.  Abdominal:     General: There is no distension.  Musculoskeletal:        General: Normal range of motion.     Cervical back: Normal range of motion.     Comments: Knee immobilizer left extremity.  Wiggles toes, compartment soft  Skin:    General: Skin is warm and dry.  Neurological:     General: No focal deficit present.     Mental Status: She is alert.  Psychiatric:        Mood and Affect: Mood normal.     Procedures  Procedures DG Hip Unilat W or Wo Pelvis 2-3 Views Left  Result Date: 02/25/2023 CLINICAL DATA:  Post reduction. EXAM: DG HIP (WITH OR WITHOUT PELVIS) 2-3V LEFT COMPARISON:  Earlier radiograph dated 02/25/2023. FINDINGS: Two AP view of the pelvis provided. On earlier image there is superior dislocation of the left hip arthroplasty. On the later image timed 3:10 p.m. there has been reduction of the left hip arthroplasty which is in anatomic alignment on the provided image. No acute fracture. The bones are osteopenic. Bilateral total hip arthroplasties. Lower lumbar fusion. The soft tissues are unremarkable.  IMPRESSION: Successful reduction of the left hip arthroplasty. Electronically Signed   By: Elgie Collard M.D.   On: 02/25/2023 16:05   DG Pelvis 1-2 Views  Result Date: 02/25/2023 CLINICAL DATA:  Left hip dislocation EXAM: PELVIS - 1 VIEW COMPARISON:  X-ray 01/07/2023 FINDINGS: Left hip hemiarthroplasty with Press-Fit components. The femoral component is dislocated lateral and there is foreshortening. No separate fracture. Right hip arthroplasty identified with screw fixated acetabular cup and Press-Fit femoral component. Stable alignment on the right. Presumed vascular calcifications in the pelvis. IMPRESSION: Dislocation of the left hip hemiarthroplasty. Electronically Signed   By: Karen Kays M.D.   On: 02/25/2023 13:16    ED Course / MDM   Clinical Course as of 02/25/23 1644  Thu Feb 25, 2023  1515 Reduced hip by Ortho, FU on repeat xray, knee imm, ambulate, dc home [BH]    Clinical Course User Index [BH] Shaheed Schmuck A, PA-C   Care assumed from provider at shift change.  See note for full HPI.  In summation 77 year old recent left hip replacement by Dr. Linna Caprice 6 weeks ago here for evaluation of dislocation.  Had successful reduction.  Plan was to observe, ambulate with knee immobilizer.  Will have her follow-up outpatient  Patient reassessed.  Pain controlled.  Ambulatory with walker which she has at home.  Imaging personally viewed and interpreted:  X-ray shows successful reduction of hip prosthesis  Will have her follow-up outpatient with Dr. Linna Caprice.  We discussed not removing her knee immobilizer.  Will write for as needed pain medications to take at home.  Instructed to not take her home tramadol while taking the Percocets.  Will have her return for new or worsening symptoms   Medical Decision Making Amount and/or Complexity of Data Reviewed Independent Historian: friend External Data Reviewed: radiology and notes. Labs: ordered. Decision-making details  documented in ED Course. Radiology: ordered and independent interpretation performed. Decision-making details documented in ED Course.  Risk OTC drugs. Prescription drug management. Parenteral controlled substances. Diagnosis or treatment significantly limited by social determinants of health.          Karlen Barbar A, PA-C 02/25/23 1644    Loetta Rough, MD 02/25/23 1733

## 2023-02-25 NOTE — ED Triage Notes (Signed)
Pt arrived via EMS, from home. Was in the shower, raised her leg up to wash foot, slipped but no fall. Left hip pain since. Visible shortening. Hip surgery L side approx 6 wks ago.

## 2023-02-25 NOTE — Discharge Instructions (Signed)
Do not take the tramadol you are previously taking.  You may take the oxycodone.  Take as prescribed cleared please use caution as this does contain a controlled substance and does have the addictive potential  Follow-up with Dr. Linna Caprice outpatient  Return for any worsening symptoms

## 2023-02-25 NOTE — ED Provider Notes (Signed)
.  Sedation  Date/Time: 02/25/2023 3:43 PM  Performed by: Laurence Spates, MD Authorized by: Laurence Spates, MD   Consent:    Consent obtained:  Written   Consent given by:  Patient   Risks discussed:  Allergic reaction, inadequate sedation, nausea, dysrhythmia, prolonged hypoxia resulting in organ damage, prolonged sedation necessitating reversal, respiratory compromise necessitating ventilatory assistance and intubation and vomiting   Alternatives discussed:  Analgesia without sedation Universal protocol:    Immediately prior to procedure, a time out was called: yes     Patient identity confirmed:  Verbally with patient and arm band Indications:    Procedure performed:  Dislocation reduction   Procedure necessitating sedation performed by:  Different physician Pre-sedation assessment:    Time since last food or drink:  12 hours   ASA classification: class 2 - patient with mild systemic disease     Mouth opening:  3 or more finger widths   Thyromental distance:  4 finger widths   Mallampati score:  II - soft palate, uvula, fauces visible   Neck mobility: normal     Pre-sedation assessments completed and reviewed: airway patency, cardiovascular function, hydration status, mental status, nausea/vomiting, pain level, respiratory function and temperature   Immediate pre-procedure details:    Reassessment: Patient reassessed immediately prior to procedure     Reviewed: vital signs, relevant labs/tests and NPO status     Verified: bag valve mask available, emergency equipment available, intubation equipment available, IV patency confirmed, oxygen available and suction available   Procedure details (see MAR for exact dosages):    Preoxygenation:  Nasal cannula   Sedation:  Etomidate   Intended level of sedation: deep   Analgesia:  Fentanyl   Intra-procedure monitoring:  Blood pressure monitoring, cardiac monitor, continuous pulse oximetry, continuous capnometry, frequent LOC assessments  and frequent vital sign checks   Intra-procedure events: none     Total Provider sedation time (minutes):  25 Post-procedure details:    Attendance: Constant attendance by certified staff until patient recovered     Recovery: Patient returned to pre-procedure baseline     Post-sedation assessments completed and reviewed: airway patency, cardiovascular function, hydration status, mental status, nausea/vomiting, pain level, respiratory function and temperature     Patient is stable for discharge or admission: yes     Procedure completion:  Tolerated well, no immediate complications     Laurence Spates, MD 02/25/23 1545

## 2023-03-08 DIAGNOSIS — Z96641 Presence of right artificial hip joint: Secondary | ICD-10-CM | POA: Diagnosis not present

## 2023-03-12 ENCOUNTER — Other Ambulatory Visit: Payer: Self-pay | Admitting: Internal Medicine

## 2023-03-18 DIAGNOSIS — M5416 Radiculopathy, lumbar region: Secondary | ICD-10-CM | POA: Diagnosis not present

## 2023-03-26 ENCOUNTER — Telehealth: Payer: Self-pay

## 2023-03-26 NOTE — Telephone Encounter (Signed)
Transition Care Management Unsuccessful Follow-up Telephone Call  Date of discharge and from where:  02/25/2023 [facility]  Attempts:  1st Attempt  Reason for unsuccessful TCM follow-up call:  No answer/busy  Kamin Niblack [facility]  [facility], [facility] Resource Care Guide Direct Dial: [phone]  Website: [email or web address]

## 2023-03-29 ENCOUNTER — Telehealth: Payer: Self-pay

## 2023-03-29 NOTE — Telephone Encounter (Signed)
Transition Care Management Unsuccessful Follow-up Telephone Call  Date of discharge and from where:  02/25/2023 [facility]   Attempts:  2nd Attempt  Reason for unsuccessful TCM follow-up call:  Left voice message  Ysabella Babiarz [facility]  [facility], [facility] Resource Care Guide Direct Dial: [phone]  Website: [email or web address]

## 2023-04-09 NOTE — Progress Notes (Signed)
This encounter was created in error - please disregard  I called patient 3 times to make sure that she did not miss my call.  No answer from patient so I left a detailed message stating that I would give her a call back within 5-7 minutes.  When I called back I got VM again so I left another detailed message for her to call the office to reschedule visit.

## 2023-04-15 ENCOUNTER — Other Ambulatory Visit: Payer: Self-pay | Admitting: Internal Medicine

## 2023-05-06 ENCOUNTER — Ambulatory Visit: Payer: PPO | Admitting: Internal Medicine

## 2023-05-11 NOTE — Telephone Encounter (Signed)
Error

## 2023-05-13 ENCOUNTER — Emergency Department (HOSPITAL_COMMUNITY): Payer: PPO

## 2023-05-13 ENCOUNTER — Emergency Department (HOSPITAL_COMMUNITY)
Admission: EM | Admit: 2023-05-13 | Discharge: 2023-05-14 | Disposition: A | Discharge status: Home / Self Care | Payer: PPO

## 2023-05-13 ENCOUNTER — Other Ambulatory Visit: Payer: Self-pay

## 2023-05-13 ENCOUNTER — Encounter (HOSPITAL_COMMUNITY): Payer: Self-pay

## 2023-05-13 DIAGNOSIS — R600 Localized edema: Secondary | ICD-10-CM | POA: Diagnosis not present

## 2023-05-13 DIAGNOSIS — Z96643 Presence of artificial hip joint, bilateral: Secondary | ICD-10-CM | POA: Diagnosis not present

## 2023-05-13 DIAGNOSIS — R000 Tachycardia, unspecified: Secondary | ICD-10-CM | POA: Diagnosis not present

## 2023-05-13 DIAGNOSIS — Z96642 Presence of left artificial hip joint: Secondary | ICD-10-CM | POA: Insufficient documentation

## 2023-05-13 DIAGNOSIS — T84021A Dislocation of internal left hip prosthesis, initial encounter: Secondary | ICD-10-CM | POA: Diagnosis not present

## 2023-05-13 DIAGNOSIS — S73035A Other anterior dislocation of left hip, initial encounter: Secondary | ICD-10-CM | POA: Diagnosis not present

## 2023-05-13 DIAGNOSIS — R609 Edema, unspecified: Secondary | ICD-10-CM | POA: Diagnosis not present

## 2023-05-13 DIAGNOSIS — I10 Essential (primary) hypertension: Secondary | ICD-10-CM | POA: Diagnosis not present

## 2023-05-13 DIAGNOSIS — S73005A Unspecified dislocation of left hip, initial encounter: Secondary | ICD-10-CM

## 2023-05-13 MED ORDER — PROPOFOL 500 MG/50ML IV EMUL
INTRAVENOUS | Status: AC | PRN
  Administered 2023-05-13: 110 mg via INTRAVENOUS

## 2023-05-13 MED ORDER — PROPOFOL 500 MG/50ML IV EMUL
0.5000 mg/kg | Freq: Once | INTRAVENOUS | Status: AC
  Administered 2023-05-13: 33.4 mg via INTRAVENOUS
  Filled 2023-05-13: qty 50

## 2023-05-13 MED ORDER — ONDANSETRON HCL 4 MG/2ML IJ SOLN
4.0000 mg | Freq: Once | INTRAMUSCULAR | Status: AC
  Administered 2023-05-13: 4 mg via INTRAVENOUS
  Filled 2023-05-13: qty 2

## 2023-05-13 MED ORDER — FENTANYL CITRATE PF 50 MCG/ML IJ SOSY
75.0000 ug | PREFILLED_SYRINGE | Freq: Once | INTRAMUSCULAR | Status: AC
  Administered 2023-05-13: 75 ug via INTRAVENOUS
  Filled 2023-05-13: qty 2

## 2023-05-13 MED ORDER — MORPHINE SULFATE (PF) 4 MG/ML IV SOLN: 4.0000 mg | Freq: Once | INTRAVENOUS | Status: DC

## 2023-05-13 MED ORDER — SODIUM CHLORIDE 0.9 % IV BOLUS
500.0000 mL | Freq: Once | INTRAVENOUS | Status: AC
  Administered 2023-05-13: 500 mL via INTRAVENOUS

## 2023-05-13 NOTE — ED Triage Notes (Signed)
Patient brought in by EMS. Patient was getting dressed this evening and felt her left hip pop out of place. Patient had a left hip dislocation in September and hip replacement in April. EMS reports shortening and rotation of the left leg. Patient also has bilateral swelling in the legs. EMS gave fentanyl. 20G LAC

## 2023-05-13 NOTE — Progress Notes (Signed)
Called to room for conscious sedation. Patient preoxygenated via nasal canula with 3L O2 and remained on 3L throughout the procedure. End Tidal monitored. Emergency airway equipment available at bedside. Minimal airway manipulation used to reverse obstructive, snoring respirations during procedure. Tolerated well. RT remained at bedside until patient awakened and communicative with staff. VSS throughout.

## 2023-05-13 NOTE — ED Provider Notes (Addendum)
[place] EMERGENCY DEPARTMENT AT [facility] Provider Note   CSN: [id number] Arrival date & time: 05/13/23  2015    Procedures .Sedation  Date/Time: 05/13/2023 11:28 PM  Performed by: Coral Spikes, DO Authorized by: Coral Spikes, DO   Consent:    Consent obtained:  Verbal and written   Consent given by:  Patient   Risks discussed:  Allergic reaction, prolonged hypoxia resulting in organ damage, inadequate sedation, respiratory compromise necessitating ventilatory assistance and intubation, vomiting and nausea   Alternatives discussed:  Analgesia without sedation Universal protocol:    Procedure explained and questions answered to patient or proxy's satisfaction: yes     Relevant documents present and verified: yes     Imaging studies available: yes     Immediately prior to procedure, a time out was called: yes   Indications:    Procedure performed:  Dislocation reduction   Procedure necessitating sedation performed by:  Physician performing sedation Pre-sedation assessment:    Time since last food or drink:  5   ASA classification: class 3 - patient with severe systemic disease     Mouth opening:  3 or more finger widths   Thyromental distance:  3 finger widths   Mallampati score:  II - soft palate, uvula, fauces visible   Neck mobility: normal     Pre-sedation assessments completed and reviewed: airway patency, cardiovascular function, hydration status, pain level, respiratory function and temperature   A pre-sedation assessment was completed prior to the start of the procedure Immediate pre-procedure details:    Reassessment: Patient reassessed immediately prior to procedure     Reviewed: vital signs     Verified: bag valve mask available, emergency equipment available, intubation equipment available, IV patency confirmed and oxygen available   Procedure details (see MAR for exact dosages):    Preoxygenation:  Nasal cannula   Sedation:  Propofol    Intended level of sedation: deep   Analgesia:  Fentanyl   Intra-procedure monitoring:  Blood pressure monitoring, cardiac monitor, continuous pulse oximetry, continuous capnometry, frequent vital sign checks and frequent LOC assessments   Intra-procedure events: none     Total Provider sedation time (minutes):  10 Post-procedure details:   A post-sedation assessment was completed following the completion of the procedure.   Attendance: Constant attendance by certified staff until patient recovered     Recovery: Patient returned to pre-procedure baseline     Procedure completion:  Tolerated well, no immediate complications .Ortho Injury Treatment  Date/Time: 05/13/2023 11:30 PM  Performed by: Coral Spikes, DO Authorized by: Coral Spikes, DO   Consent:    Consent obtained:  Verbal and written   Consent given by:  Patient   Risks discussed:  Fracture, nerve damage, irreducible dislocation, recurrent dislocation and vascular damage   Alternatives discussed:  No treatment and alternative treatmentInjury location: hip Location details: left hip Injury type: dislocation Dislocation type: anterior Spontaneous dislocation: yes Pre-procedure neurovascular assessment: neurovascularly intact Pre-procedure distal perfusion: normal Pre-procedure neurological function: normal Pre-procedure range of motion: reduced  Anesthesia: Local anesthesia used: no  Patient sedated: Yes. Refer to sedation procedure documentation for details of sedation. Manipulation performed: yes Reduction method: Allis maneuver Reduction successful: yes X-ray confirmed reduction: yes Immobilization: splint and crutches Splint type: knee immobilizer. Splint Applied by: ED Nurse Post-procedure neurovascular assessment: post-procedure neurovascularly intact Post-procedure distal perfusion: normal Post-procedure neurological function: normal Post-procedure range of motion: improved       Coral Spikes,  DO 05/13/23  2333    Coral Spikes, DO 05/13/23 2339

## 2023-05-13 NOTE — ED Provider Notes (Signed)
[place] EMERGENCY DEPARTMENT AT [facility] Provider Note   CSN: [id number] Arrival date & time: 05/13/23  2015     History  Chief Complaint  Patient presents with   Hip Pain    Left    Debra Farmer is a 77 y.o. female past medical history significant for total hip arthroplasty left July 2024 presents today for left hip pain.  Patient states that she was getting undressed this evening and felt her hip pop out of place.  Patient had a dislocation in September.  Patient's leg is shortened and internally rotated.  Patient denies fall, head trauma, pain anywhere else, numbness, tingling.   Hip Pain       Home Medications Prior to Admission medications   Medication Sig Start Date End Date Taking? Authorizing Provider  furosemide (LASIX) 20 MG tablet TAKE 1-2 TABLETS BY MOUTH DAILY AS NEEDED FOR EDEMA Patient taking differently: Take 20 mg by mouth daily as needed for edema. 03/04/22   Plotnikov, Georgina Quint, MD  linaclotide (LINZESS) 290 MCG CAPS capsule Take 1 capsule (290 mcg total) by mouth daily. Patient taking differently: Take 290 mcg by mouth daily as needed (constipation). 11/11/21   Plotnikov, Georgina Quint, MD  Multiple Vitamins-Minerals (IMMUNE SUPPORT PO) Take 1 tablet by mouth daily.    [provider]  nitroGLYCERIN (NITROSTAT) 0.4 MG SL tablet Place 1 tablet (0.4 mg total) under the tongue every 5 (five) minutes x 3 doses as needed for chest pain. 07/21/20   Orpah Cobb, MD  ondansetron (ZOFRAN) 4 MG tablet Take 1 tablet (4 mg total) by mouth every 8 (eight) hours as needed for nausea or vomiting. 01/07/23 01/07/24  Clois Dupes, PA-C  oxyCODONE (ROXICODONE) 5 MG immediate release tablet Take 1 tablet (5 mg total) by mouth every 4 (four) hours as needed for severe pain. 02/25/23   Henderly, Britni A, PA-C  phentermine 37.5 MG capsule Take 1 capsule (37.5 mg total) by mouth every morning. 07/29/22   Plotnikov, Georgina Quint, MD  potassium chloride (KLOR-CON  M) 10 MEQ tablet Take 1 tablet (10 mEq total) by mouth daily. Patient taking differently: Take 10 mEq by mouth daily as needed (when taking furosemide). 11/30/22   Plotnikov, Georgina Quint, MD  predniSONE (DELTASONE) 1 MG tablet Take 4 mg by mouth daily with breakfast.    [provider]  traMADol (ULTRAM) 50 MG tablet TAKE 1 TABLET BY MOUTH EVERY 6 HOURS 03/12/23   Plotnikov, Georgina Quint, MD  zolpidem (AMBIEN) 10 MG tablet take 1 tablet by mouth at bedime for sleep 04/20/23   Plotnikov, Georgina Quint, MD      Allergies    Aspirin, Codeine sulfate, Diltiazem, Gabapentin, Lipitor [atorvastatin], Methotrexate, Miralax [polyethylene glycol], Nabumetone, Plaquenil [hydroxychloroquine], Pravastatin, Sarilumab, Senna, and Statins    Review of Systems   Review of Systems  Musculoskeletal:  Positive for arthralgias.    Physical Exam Updated Vital Signs BP (!) 151/59   Pulse 70   Temp 97.8 F (36.6 C) (Oral)   Resp 12   Ht 5' (1.524 m)   Wt 66.7 kg   SpO2 99%   BMI 28.71 kg/m  Physical Exam Vitals and nursing note reviewed.  Constitutional:      General: She is not in acute distress.    Appearance: She is well-developed.  HENT:     Head: Normocephalic and atraumatic.     Right Ear: External ear normal.     Left Ear: External ear normal.  Eyes:     Conjunctiva/sclera: Conjunctivae normal.  Cardiovascular:     Rate and Rhythm: Normal rate and regular rhythm.     Heart sounds: No murmur heard. Pulmonary:     Effort: Pulmonary effort is normal. No respiratory distress.     Breath sounds: Normal breath sounds.  Abdominal:     General: Abdomen is flat.     Palpations: Abdomen is soft.     Tenderness: There is no abdominal tenderness.  Musculoskeletal:        General: Deformity present. No swelling.     Cervical back: Neck supple.     Comments: +1 bilateral pitting edema Patient's left leg is shortened and internally rotated.  Strong pulses present.  Neurovascularly and  neuromuscularly intact.  Skin:    General: Skin is warm and dry.     Capillary Refill: Capillary refill takes less than 2 seconds.  Neurological:     General: No focal deficit present.     Mental Status: She is alert.  Psychiatric:        Mood and Affect: Mood normal.     ED Results / Procedures / Treatments   Labs (all labs ordered are listed, but only abnormal results are displayed) Labs Reviewed - No data to display  EKG None  Radiology DG HIP PORT UNILAT WITH PELVIS 1V LEFT  Result Date: 05/13/2023 CLINICAL DATA:  Post reduction EXAM: DG HIP (WITH OR WITHOUT PELVIS) 1V PORT LEFT COMPARISON:  05/13/2023 FINDINGS: Bilateral hip replacements. Interval reduction of previously noted dislocated left femoral component now with normal alignment. No definitive fracture IMPRESSION: Interval reduction of previously noted dislocated left femoral component. Electronically Signed   By: Jasmine Pang M.D.   On: 05/13/2023 23:03   DG Hip Unilat With Pelvis 2-3 Views Left  Result Date: 05/13/2023 CLINICAL DATA:  Left hip dislocation EXAM: DG HIP (WITH OR WITHOUT PELVIS) 2-3V LEFT COMPARISON:  02/25/2023 FINDINGS: Left total hip arthroplasty has been performed. There is anterosuperior dislocation of the left hip are. No superimposed fracture identified. L4-5 lumbar fusion with instrumentation and right total hip arthroplasty are incidentally noted. Sacroiliac joint spaces are preserved. Soft tissues are unremarkable. IMPRESSION: 1. 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 Electronically Signed   By: Helyn Numbers M.D.   On: 05/13/2023 20:54    Procedures Procedures    Medications Ordered in ED Medications  ondansetron (ZOFRAN) injection 4 mg (4 mg Intravenous Given 05/13/23 2144)  propofol (DIPRIVAN) 500 MG/50ML infusion 33.4 mg (0 mg Intravenous Stopped 05/13/23 2317)  sodium chloride 0.9 % bolus 500 mL ( Intravenous Stopped 05/13/23 2217)  fentaNYL (SUBLIMAZE) injection 75  mcg (75 mcg Intravenous Given 05/13/23 2144)  propofol (DIPRIVAN) 500 MG/50ML infusion (0 mcg/kg/min Intravenous Stopped 05/13/23 2244)    ED Course/ Medical Decision Making/ A&P                                 Medical Decision Making Amount and/or Complexity of Data Reviewed Radiology: ordered.  Risk Prescription drug management.   This patient presents to the ED with chief complaint(s) of left hip pain with pertinent past medical history of total hip arthroplasty which further complicates the presenting complaint. The complaint involves an extensive differential diagnosis and also carries with it a high risk of complications and morbidity.    The differential diagnosis includes hip dislocation, musculoskeletal pain, hip fracture  Additional history obtained: Additional history obtained from  family Records reviewed  orthopedic notes  ED Course and Reassessment: Patient placed in knee immobilizer and has a walker at home that she would prefer to use.  Independent visualization of imaging: - I independently visualized the following imaging with scope of interpretation limited to determining acute life threatening conditions related to emergency care: Left pelvis xray, which revealed anterior superior dislocation of the left hip prosthesis.  Postreduction x-ray: Interval reduction of previously noted dislocated left femoral component  Consultation: - Consulted or discussed management/test interpretation w/ external professional: None  Consideration for admission or further workup: For admission for throat however patient hip was successfully reduced.  Patient was placed in knee immobilizer and given crutches.  Patient should follow-up with orthopedics as soon as possible.        Final Clinical Impression(s) / ED Diagnoses Final diagnoses:  Dislocation of left hip, initial encounter [facility])    Rx / DC Orders ED Discharge Orders     None         Dolphus Jenny,  PA-C 05/13/23 2335    Coral Spikes, DO 05/13/23 2340

## 2023-05-13 NOTE — Discharge Instructions (Signed)
Today you were seen for left hip dislocation.  Please call Dr. Linna Caprice with orthopedics tomorrow to schedule an appointment for follow-up.  Thank you for letting us treat you today. After performing physical exam and imaging, I feel you are safe to go home. Please follow up with your PCP in the next several days and provide them with your records from this visit. Return to the Emergency Room if pain becomes severe or symptoms worsen.

## 2023-05-17 DIAGNOSIS — T84021D Dislocation of internal left hip prosthesis, subsequent encounter: Secondary | ICD-10-CM | POA: Diagnosis not present

## 2023-05-17 DIAGNOSIS — Z96643 Presence of artificial hip joint, bilateral: Secondary | ICD-10-CM | POA: Diagnosis not present

## 2023-06-23 ENCOUNTER — Ambulatory Visit: Payer: PPO | Admitting: Internal Medicine

## 2023-06-23 ENCOUNTER — Encounter: Payer: Self-pay | Admitting: Internal Medicine

## 2023-06-23 VITALS — BP 110/60 | HR 87 | Temp 98.2°F | Ht 60.0 in | Wt 150.0 lb

## 2023-06-23 DIAGNOSIS — E785 Hyperlipidemia, unspecified: Secondary | ICD-10-CM | POA: Diagnosis not present

## 2023-06-23 DIAGNOSIS — Z981 Arthrodesis status: Secondary | ICD-10-CM

## 2023-06-23 DIAGNOSIS — M0600 Rheumatoid arthritis without rheumatoid factor, unspecified site: Secondary | ICD-10-CM | POA: Diagnosis not present

## 2023-06-23 DIAGNOSIS — E669 Obesity, unspecified: Secondary | ICD-10-CM

## 2023-06-23 DIAGNOSIS — Z6829 Body mass index (BMI) 29.0-29.9, adult: Secondary | ICD-10-CM

## 2023-06-23 DIAGNOSIS — Z23 Encounter for immunization: Secondary | ICD-10-CM | POA: Diagnosis not present

## 2023-06-23 DIAGNOSIS — I2510 Atherosclerotic heart disease of native coronary artery without angina pectoris: Secondary | ICD-10-CM | POA: Diagnosis not present

## 2023-06-23 DIAGNOSIS — M1611 Unilateral primary osteoarthritis, right hip: Secondary | ICD-10-CM

## 2023-06-23 MED ORDER — FUROSEMIDE 20 MG PO TABS: ORAL_TABLET | ORAL | 1 refills | Status: DC

## 2023-06-23 MED ORDER — ZOLPIDEM TARTRATE 10 MG PO TABS: ORAL_TABLET | ORAL | 1 refills | Status: DC

## 2023-06-23 MED ORDER — PHENTERMINE HCL 37.5 MG PO CAPS: 37.5000 mg | ORAL_CAPSULE | ORAL | 2 refills | Status: DC

## 2023-06-23 MED ORDER — TRAMADOL HCL 50 MG PO TABS: 50.0000 mg | ORAL_TABLET | Freq: Four times a day (QID) | ORAL | 3 refills | Status: DC

## 2023-06-23 NOTE — Assessment & Plan Note (Signed)
 F/u w/Dr Nickola Major Pt wants to go back on Humira

## 2023-06-23 NOTE — Assessment & Plan Note (Signed)
 Chronic LBP - better Tramadol prn  Potential benefits of a long term opioids use as well as potential risks (i.e. addiction risk, apnea etc) and complications (i.e. Somnolence, constipation and others) were explained to the patient and were aknowledged.

## 2023-06-23 NOTE — Assessment & Plan Note (Signed)
 BMI 29 On Phentermine  Potential benefits of a long term phentermine  use as well as potential risks  and complications were explained to the patient and were aknowledged.

## 2023-06-23 NOTE — Assessment & Plan Note (Signed)
On Brilinta, Coreg, Red Rice yeast

## 2023-06-23 NOTE — Assessment & Plan Note (Signed)
On Rx 

## 2023-06-23 NOTE — Assessment & Plan Note (Signed)
On  Red Rice yeast 

## 2023-06-23 NOTE — Progress Notes (Signed)
 Subjective:  Patient ID: Debra Farmer, female    DOB: [birth year]  Age: 78 y.o. MRN: [id number]  CC: Medication Refill   HPI [facility]ADAW[facility] BOULLION presents for RA, chronic pain, s/p spinal fusion  Outpatient Medications Prior to Visit  Medication Sig Dispense Refill   linaclotide  (LINZESS ) 290 MCG CAPS capsule Take 1 capsule (290 mcg total) by mouth daily. (Patient taking differently: Take 290 mcg by mouth daily as needed (constipation).) 30 capsule 11   Multiple Vitamins-Minerals (IMMUNE SUPPORT PO) Take 1 tablet by mouth daily.     nitroGLYCERIN  (NITROSTAT ) 0.4 MG SL tablet Place 1 tablet (0.4 mg total) under the tongue every 5 (five) minutes x 3 doses as needed for chest pain. 25 tablet 1   ondansetron  (ZOFRAN ) 4 MG tablet Take 1 tablet (4 mg total) by mouth every 8 (eight) hours as needed for nausea or vomiting. 30 tablet 0   potassium chloride  (KLOR-CON  M) 10 MEQ tablet Take 1 tablet (10 mEq total) by mouth daily. (Patient taking differently: Take 10 mEq by mouth daily as needed (when taking furosemide ).) 90 tablet 3   predniSONE  (DELTASONE ) 1 MG tablet Take 4 mg by mouth daily with breakfast.     furosemide  (LASIX ) 20 MG tablet TAKE 1-2 TABLETS BY MOUTH DAILY AS NEEDED FOR EDEMA (Patient taking differently: Take 20 mg by mouth daily as needed for edema.) 60 tablet 5   oxyCODONE  (ROXICODONE ) 5 MG immediate release tablet Take 1 tablet (5 mg total) by mouth every 4 (four) hours as needed for severe pain. 15 tablet 0   phentermine  37.5 MG capsule Take 1 capsule (37.5 mg total) by mouth every morning. 30 capsule 2   traMADol  (ULTRAM ) 50 MG tablet TAKE 1 TABLET BY MOUTH EVERY 6 HOURS 120 tablet 3   zolpidem  (AMBIEN ) 10 MG tablet take 1 tablet by mouth at bedime for sleep 90 tablet 1   No facility-administered medications prior to visit.    ROS: Review of Systems  Constitutional:  Negative for activity change, appetite change, chills, fatigue and unexpected weight change.  HENT:   Negative for congestion, mouth sores and sinus pressure.   Eyes:  Negative for visual disturbance.  Respiratory:  Negative for cough and chest tightness.   Gastrointestinal:  Negative for abdominal pain and nausea.  Genitourinary:  Negative for difficulty urinating, frequency and vaginal pain.  Musculoskeletal:  Positive for arthralgias, back pain and gait problem.  Skin:  Negative for pallor and rash.  Neurological:  Negative for dizziness, tremors, weakness, numbness and headaches.  Psychiatric/Behavioral:  Positive for sleep disturbance. Negative for confusion and suicidal ideas. The patient is nervous/anxious.     Objective:  BP 110/60 (BP Location: Left Arm, Patient Position: Sitting, Cuff Size: Normal)   Pulse 87   Temp 98.2 F (36.8 C) (Oral)   Ht 5' (1.524 m)   Wt 150 lb (68 kg)   SpO2 94%   BMI 29.29 kg/m   BP Readings from Last 3 Encounters:  06/23/23 110/60  05/13/23 (!) 163/88  02/25/23 (!) 150/88    Wt Readings from Last 3 Encounters:  06/23/23 150 lb (68 kg)  05/13/23 147 lb (66.7 kg)  12/28/22 150 lb (68 kg)    Physical Exam Constitutional:      General: She is not in acute distress.    Appearance: She is well-developed. She is obese.  HENT:     Head: Normocephalic.     Right Ear: External ear normal.  Left Ear: External ear normal.     Nose: Nose normal.  Eyes:     General:        Right eye: No discharge.        Left eye: No discharge.     Conjunctiva/sclera: Conjunctivae normal.     Pupils: Pupils are equal, round, and reactive to light.  Neck:     Thyroid : No thyromegaly.     Vascular: No JVD.     Trachea: No tracheal deviation.  Cardiovascular:     Rate and Rhythm: Normal rate and regular rhythm.     Heart sounds: Normal heart sounds.  Pulmonary:     Effort: No respiratory distress.     Breath sounds: No stridor. No wheezing.  Abdominal:     General: Bowel sounds are normal. There is no distension.     Palpations: Abdomen is soft.  There is no mass.     Tenderness: There is no abdominal tenderness. There is no guarding or rebound.  Musculoskeletal:        General: Tenderness present.     Cervical back: Normal range of motion and neck supple. No rigidity.  Lymphadenopathy:     Cervical: No cervical adenopathy.  Skin:    Findings: No erythema or rash.  Neurological:     Mental Status: She is oriented to person, place, and time.     Cranial Nerves: No cranial nerve deficit.     Motor: No abnormal muscle tone.     Coordination: Coordination normal.     Deep Tendon Reflexes: Reflexes normal.  Psychiatric:        Behavior: Behavior normal.        Thought Content: Thought content normal.        Judgment: Judgment normal.   Joints, LS w/pain   Lab Results  Component Value Date   WBC 9.7 02/25/2023   HGB 13.0 02/25/2023   HCT 41.6 02/25/2023   PLT 278 02/25/2023   GLUCOSE 102 (H) 02/25/2023   CHOL 275 (H) 07/20/2020   TRIG 321 (H) 07/20/2020   HDL 40 (L) 07/20/2020   LDLDIRECT 170.0 12/05/2014   LDLCALC 171 (H) 07/20/2020   ALT 15 11/30/2022   AST 20 11/30/2022   NA 138 02/25/2023   K 3.7 02/25/2023   CL 104 02/25/2023   CREATININE 0.62 02/25/2023   BUN 13 02/25/2023   CO2 25 02/25/2023   TSH 1.83 01/23/2020   INR 0.9 02/25/2023   HGBA1C 5.9 (H) 07/20/2020    DG HIP PORT UNILAT WITH PELVIS 1V LEFT Result Date: 05/13/2023 CLINICAL DATA:  Post reduction EXAM: DG HIP (WITH OR WITHOUT PELVIS) 1V PORT LEFT COMPARISON:  05/13/2023 FINDINGS: Bilateral hip replacements. Interval reduction of previously noted dislocated left femoral component now with normal alignment. No definitive fracture IMPRESSION: Interval reduction of previously noted dislocated left femoral component. Electronically Signed   By: Luke Bun M.D.   On: 05/13/2023 23:03   DG Hip Unilat With Pelvis 2-3 Views Left Result Date: 05/13/2023 CLINICAL DATA:  Left hip dislocation EXAM: DG HIP (WITH OR WITHOUT PELVIS) 2-3V LEFT COMPARISON:   02/25/2023 FINDINGS: Left total hip arthroplasty has been performed. There is anterosuperior dislocation of the left hip are. No superimposed fracture identified. L4-5 lumbar fusion with instrumentation and right total hip arthroplasty are incidentally noted. Sacroiliac joint spaces are preserved. Soft tissues are unremarkable. IMPRESSION: 1. 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 Electronically Signed   By: Dorethia Molt M.D.   On:  05/13/2023 20:54    Assessment & Plan:   Problem List Items Addressed This Visit     Dyslipidemia   On  Red Rice yeast      Seronegative rheumatoid arthritis (HCC)   F/u w/Dr Ishmael Pt wants to go back on Humira       Relevant Medications   traMADol  (ULTRAM ) 50 MG tablet   Coronary atherosclerosis   On Brilinta , Coreg , Red Rice yeast      Relevant Medications   furosemide  (LASIX ) 20 MG tablet   S/P lumbar fusion   Chronic LBP - better Tramadol  prn  Potential benefits of a long term opioids use as well as potential risks (i.e. addiction risk, apnea etc) and complications (i.e. Somnolence, constipation and others) were explained to the patient and were aknowledged.       Other Visit Diagnoses       Need for influenza vaccination    -  Primary   Relevant Orders   Flu Vaccine Trivalent High Dose (Fluad) (Completed)         Meds ordered this encounter  Medications   furosemide  (LASIX ) 20 MG tablet    Sig: TAKE 1-2 TABLETS BY MOUTH DAILY AS NEEDED FOR EDEMA    Dispense:  120 tablet    Refill:  1   zolpidem  (AMBIEN ) 10 MG tablet    Sig: take 1 tablet by mouth at bedime for sleep    Dispense:  90 tablet    Refill:  1   phentermine  37.5 MG capsule    Sig: Take 1 capsule (37.5 mg total) by mouth every morning.    Dispense:  30 capsule    Refill:  2   traMADol  (ULTRAM ) 50 MG tablet    Sig: Take 1 tablet (50 mg total) by mouth every 6 (six) hours.    Dispense:  120 tablet    Refill:  3      Follow-up: No follow-ups on  file.  Marolyn Noel, MD

## 2023-08-10 DIAGNOSIS — Z96641 Presence of right artificial hip joint: Secondary | ICD-10-CM | POA: Diagnosis not present

## 2023-08-10 DIAGNOSIS — M7061 Trochanteric bursitis, right hip: Secondary | ICD-10-CM | POA: Diagnosis not present

## 2023-08-10 DIAGNOSIS — M545 Low back pain, unspecified: Secondary | ICD-10-CM | POA: Diagnosis not present

## 2023-08-10 DIAGNOSIS — M7062 Trochanteric bursitis, left hip: Secondary | ICD-10-CM | POA: Diagnosis not present

## 2023-08-13 ENCOUNTER — Ambulatory Visit: Payer: PPO

## 2023-08-13 VITALS — Ht 60.0 in | Wt 143.0 lb

## 2023-08-13 DIAGNOSIS — Z0000 Encounter for general adult medical examination without abnormal findings: Secondary | ICD-10-CM

## 2023-08-13 DIAGNOSIS — Z1211 Encounter for screening for malignant neoplasm of colon: Secondary | ICD-10-CM

## 2023-08-13 DIAGNOSIS — Z1212 Encounter for screening for malignant neoplasm of rectum: Secondary | ICD-10-CM

## 2023-08-13 NOTE — Progress Notes (Addendum)
 Subjective:   Debra Farmer is a 78 y.o. who presents for a Medicare Wellness preventive visit.  Visit Complete: Virtual I connected with  Debra Farmer on 08/13/23 by a audio enabled telemedicine application and verified that I am speaking with the correct person using two identifiers.  Patient Location: Home  Provider Location: Home Office  I discussed the limitations of evaluation and management by telemedicine. The patient expressed understanding and agreed to proceed.  Vital Signs: Because this visit was a virtual/telehealth visit, some criteria may be missing or patient reported. Any vitals not documented were not able to be obtained and vitals that have been documented are patient reported.  VideoDeclined- This patient declined Librarian, academic. Therefore the visit was completed with audio only.  AWV Questionnaire: No: Patient Medicare AWV questionnaire was not completed prior to this visit.  Cardiac Risk Factors include: advanced age (>35men, >60 women)     Objective:    Today's Vitals   08/13/23 1313  Weight: 143 lb (64.9 kg)  Height: 5' (1.524 m)   Body mass index is 27.93 kg/m.     08/13/2023    1:22 PM 05/13/2023    8:28 PM 12/28/2022   11:35 AM 05/27/2022    8:18 AM 05/22/2022    1:27 PM 05/11/2020    9:23 PM 04/04/2020    8:00 AM  Advanced Directives  Does Patient Have a Medical Advance Directive? No No No No No No No  Would patient like information on creating a medical advance directive?   No - Patient declined No - Patient declined No - Patient declined  No - Patient declined    Current Medications (verified) Outpatient Encounter Medications as of 08/13/2023  Medication Sig   aspirin EC 81 MG tablet Take 81 mg by mouth daily. Swallow whole.   furosemide (LASIX) 20 MG tablet TAKE 1-2 TABLETS BY MOUTH DAILY AS NEEDED FOR EDEMA   linaclotide (LINZESS) 290 MCG CAPS capsule Take 1 capsule (290 mcg total) by mouth  daily. (Patient taking differently: Take 290 mcg by mouth daily as needed (constipation).)   Multiple Vitamins-Minerals (IMMUNE SUPPORT PO) Take 1 tablet by mouth daily.   nitroGLYCERIN (NITROSTAT) 0.4 MG SL tablet Place 1 tablet (0.4 mg total) under the tongue every 5 (five) minutes x 3 doses as needed for chest pain.   phentermine 37.5 MG capsule Take 1 capsule (37.5 mg total) by mouth every morning.   potassium chloride (KLOR-CON M) 10 MEQ tablet Take 1 tablet (10 mEq total) by mouth daily. (Patient taking differently: Take 10 mEq by mouth daily as needed (when taking furosemide).)   traMADol (ULTRAM) 50 MG tablet Take 1 tablet (50 mg total) by mouth every 6 (six) hours.   zolpidem (AMBIEN) 10 MG tablet take 1 tablet by mouth at bedime for sleep   ondansetron (ZOFRAN) 4 MG tablet Take 1 tablet (4 mg total) by mouth every 8 (eight) hours as needed for nausea or vomiting. (Patient not taking: Reported on 08/13/2023)   predniSONE (DELTASONE) 1 MG tablet Take 4 mg by mouth daily with breakfast. (Patient not taking: Reported on 08/13/2023)   No facility-administered encounter medications on file as of 08/13/2023.    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, right   Pneumonia    10 years ago per pt   Past Surgical History:  Procedure Laterality Date   ABDOMINAL HYSTERECTOMY     partial   ANTERIOR CERVICAL DECOMPRESSION/DISCECTOMY FUSION 4 LEVELS N/A 04/03/2020    Procedure: ANTERIOR CERVICAL DECOMPRESSION/DISCECTOMY FUSION C3-7;  Surgeon: Venita Lick, MD;  Location: [facility] OR;  Service: Orthopedics;  Laterality: N/A;  5 hrs   APPENDECTOMY     CATARACT EXTRACTION Bilateral    about 3 years ago    CHOLECYSTECTOMY     CORONARY STENT INTERVENTION N/A 07/19/2020   Procedure: CORONARY STENT INTERVENTION;  Surgeon: Lyn Records, MD;  Location: [facility];  Service: Cardiovascular;  Laterality: N/A;   KNEE ARTHROSCOPY  1999   Dr Thomasena Edis -- R knee   LEFT HEART CATH AND CORONARY ANGIOGRAPHY N/A 07/19/2020   Procedure: LEFT HEART CATH AND CORONARY ANGIOGRAPHY;  Surgeon: Orpah Cobb, MD;  Location: [facility];  Service: Cardiovascular;  Laterality: N/A;   LUMBAR LAMINECTOMY/DECOMPRESSION MICRODISCECTOMY Right 08/26/2016   Procedure: Microlumbar decompression L4-5, L5-S1 on Right;  Surgeon: Jene Every, MD;  Location: [facility];  Service: Orthopedics;  Laterality: Right;   POSTERIOR CERVICAL FUSION/FORAMINOTOMY N/A 04/04/2020   Procedure: POSTERIOR SPINAL FUSION INSTRUMENTATION CERVICAL THREE THROUGH SEVEN, CERVICAL THREE LAMINOTOMY;  Surgeon: Venita Lick, MD;  Location: [facility] OR;  Service: Orthopedics;  Laterality: N/A;  5 hrs   TOTAL HIP ARTHROPLASTY Right 04/01/2017   Procedure: RIGHT TOTAL HIP ARTHROPLASTY ANTERIOR APPROACH;  Surgeon: Samson Frederic, MD;  Location: [facility];  Service: Orthopedics;  Laterality: Right;  Needs RNFA   TOTAL HIP ARTHROPLASTY Left 01/07/2023   Procedure: TOTAL HIP ARTHROPLASTY ANTERIOR APPROACH;  Surgeon: Samson Frederic, MD;  Location: [facility];  Service: Orthopedics;  Laterality: Left;  120   Family History  Problem Relation Age of Onset   Heart disease Mother 34       CHF   COPD Father 33       emphesema   Social History   Socioeconomic History   Marital status: Married    Spouse name: Tinnie Gens   Number of children: 3   Years of education: Not on file   Highest education level: Not on file  Occupational History    Occupation: RETIRED  Tobacco Use   Smoking status: Former    Current packs/day: 0.00    Average packs/day: 0.5 packs/day for 58.0 years (29.0 ttl pk-yrs)    Types: Cigarettes    Start date: 02/16/1960    Quit date: 02/15/2018    Years since quitting: 5.4   Smokeless tobacco: Never  Vaping Use   Vaping status: Former  Substance and Sexual Activity   Alcohol use: No   Drug use: No   Sexual activity: Yes  Other Topics Concern   Not on file  Social History Narrative   Lives at home with husband and 2 dogs   Social Drivers of Corporate investment banker Strain: Not on file  Food Insecurity: Not on file  Transportation Needs: Not on file  Physical Activity: Not on file  Stress: Not on file  Social Connections: Not on file    Tobacco Counseling Counseling given: Not Answered    Clinical Intake:  Pre-visit preparation completed: Yes  Pain : No/denies  pain     BMI - recorded: 27.93 Nutritional Status: BMI 25 -29 Overweight  How often do you need to have someone help you when you read instructions, pamphlets, or other written materials from your doctor or pharmacy?: 1 - Never  Interpreter Needed?: No  Information entered by :: Kaitlyn Skowron, RMA   Activities of Daily Living     08/13/2023    1:15 PM 12/28/2022   11:39 AM  In your present state of health, do you have any difficulty performing the following activities:  Hearing? 0   Vision? 0   Difficulty concentrating or making decisions? 0   Walking or climbing stairs? 0   Dressing or bathing? 0   Doing errands, shopping? 0 0  Preparing Food and eating ? N   Using the Toilet? N   In the past six months, have you accidently leaked urine? N   Do you have problems with loss of bowel control? N   Managing your Medications? N   Managing your Finances? N   Housekeeping or managing your Housekeeping? N     Patient Care Team: Plotnikov, Georgina Quint, MD as PCP - General Zenovia Jordan, MD as Consulting Physician  (Rheumatology) Toni Arthurs, MD as Consulting Physician (Orthopedic Surgery) Jene Every, MD as Consulting Physician (Orthopedic Surgery) Sheran Luz, MD as Consulting Physician (Physical Medicine and Rehabilitation) Venita Lick, MD as Consulting Physician (Orthopedic Surgery) Dominica Severin, MD as Consulting Physician (Orthopedic Surgery) Orpah Cobb, MD as Consulting Physician (Cardiology) Samson Frederic, MD as Consulting Physician (Orthopedic Surgery)  Indicate any recent Medical Services you may have received from other than [facility] providers in the past year (date may be approximate).     Assessment:   This is a routine wellness examination for Jatziri.  Hearing/Vision screen Hearing Screening - Comments:: Denies hearing difficulties   Vision Screening - Comments:: Wears contacts and eyeglasses   Goals Addressed               This Visit's Progress     Patient Stated (pt-stated)        Would like to lose some weight       Depression Screen     06/23/2023   10:42 AM 10/05/2022    3:43 PM 07/29/2022    3:24 PM 02/05/2021    3:03 PM 04/18/2019    3:18 PM 03/17/2018    4:23 PM 03/03/2017    3:33 PM  PHQ 2/9 Scores  PHQ - 2 Score 0 0 0 0 0 0 0  PHQ- 9 Score  0  2       Fall Risk     08/13/2023    1:23 PM 06/23/2023   10:41 AM 10/05/2022    3:43 PM 07/29/2022    3:24 PM 02/05/2021    3:03 PM  Fall Risk   Falls in the past year? 0 0 0 0 0  Number falls in past yr: 0 0 0 0 0  Injury with Fall? 0 0 0 0 0  Risk for fall due to : No Fall Risks No Fall Risks  No Fall Risks No Fall Risks  Follow up Falls prevention discussed;Falls evaluation completed Falls evaluation completed Falls evaluation completed Falls evaluation completed     MEDICARE RISK AT HOME:  Medicare Risk at Home Any stairs in or around the home?: Yes (outside) If so, are there any without handrails?: Yes Home free of loose throw rugs in walkways, pet beds, electrical cords, etc?: Yes Adequate  lighting in your home to reduce risk of falls?: Yes Life alert?: No Use of a cane, walker or w/c?: No Grab bars in the bathroom?: Yes Shower chair or bench in shower?: Yes Elevated toilet seat or a handicapped toilet?: Yes  TIMED UP AND GO:  Was the test performed?  No  Cognitive Function: 6CIT completed        08/13/2023    1:23 PM  6CIT Screen  What Year? 0 points  What month? 0 points    Immunizations Immunization History  Administered Date(s) Administered   Fluad Quad(high Dose 65+) 04/18/2019, 03/20/2020, 06/18/2021, 03/18/2022   Fluad Trivalent(High Dose 65+) 06/23/2023   Influenza Whole 05/23/2010   Influenza, High Dose Seasonal PF 03/03/2017, 03/17/2018   Influenza,inj,Quad PF,6+ Mos 03/06/2014, 04/12/2015, 02/10/2016   Influenza-Unspecified 05/09/2013   Pneumococcal Conjugate-13 09/13/2013   Pneumococcal Polysaccharide-23 06/19/2013   Td 08/13/1998, 07/26/2009   Zoster, Live 01/27/2012    Screening Tests Health Maintenance  Topic Date Due   Hepatitis C Screening  Never done   Lung Cancer Screening  09/22/2018   DTaP/Tdap/Td (3 - Tdap) 07/27/2019   Medicare Annual Wellness (AWV)  08/12/2024   Pneumonia Vaccine 44+ Years old  Completed   INFLUENZA VACCINE  Completed   DEXA SCAN  Completed   HPV VACCINES  Aged Out   Colonoscopy  Discontinued   COVID-19 Vaccine  Discontinued   Zoster Vaccines- Shingrix  Discontinued    Health Maintenance  Health Maintenance Due  Topic Date Due   Hepatitis C Screening  Never done   Lung Cancer Screening  09/22/2018   DTaP/Tdap/Td (3 - Tdap) 07/27/2019   Health Maintenance Items Addressed: Referral sent to GI for colonoscopy, Hepatitis C Screening, See Nurse Notes  Additional Screening:  Vision Screening: Recommended annual ophthalmology exams for early detection of glaucoma and other disorders of the eye.  Dental Screening: Recommended annual dental exams for proper oral hygiene  Community Resource Referral /  Chronic Care Management: CRR required this visit?  No   CCM required this visit?  No     Plan:     I have personally reviewed and noted the following in the patient's chart:   Medical and social history Use of alcohol, tobacco or illicit drugs  Current medications and supplements including opioid prescriptions. Patient is currently taking opioid prescriptions. Information provided to patient regarding non-opioid alternatives. Patient advised to discuss non-opioid treatment plan with their provider. Functional ability and status Nutritional status Physical activity Advanced directives List of other physicians Hospitalizations, surgeries, and ER visits in previous 12 months Vitals Screenings to include cognitive, depression, and falls Referrals and appointments  In addition, I have reviewed and discussed with patient certain preventive protocols, quality metrics, and best practice recommendations. A written personalized care plan for preventive services as well as general preventive health recommendations were provided to patient.     Nechemia Chiappetta L Johnny Latu, CMA   08/13/2023   After Visit Summary: (MyChart) Due to this being a telephonic visit, the after visit summary with patients personalized plan was offered to patient via MyChart   Notes: Please refer to Routing Comments.  Medical screening examination/treatment/procedure(s) were performed by non-physician practitioner and as supervising physician I was immediately available for consultation/collaboration.  I agree with above. Jacinta Shoe, MD

## 2023-08-13 NOTE — Patient Instructions (Addendum)
 Ms. Ranieri , Thank you for taking time to come for your Medicare Wellness Visit. I appreciate your ongoing commitment to your health goals. Please review the following plan we discussed and let me know if I can assist you in the future.   Referrals/Orders/Follow-Ups/Clinician Recommendations: It was nice talking to you today.  You are due for a Tdap and Shingles vaccine.  Please call [facility], at [phone] to get scheduled for a colonoscopy.  Remember to call and get schedule also for an eye exam, as you are due for one.    This is a list of the screening recommended for you and due dates:  Health Maintenance  Topic Date Due   Hepatitis C Screening  Never done   Screening for Lung Cancer  09/22/2018   DTaP/Tdap/Td vaccine (3 - Tdap) 07/27/2019   Medicare Annual Wellness Visit  08/12/2024   Pneumonia Vaccine  Completed   Flu Shot  Completed   DEXA scan (bone density measurement)  Completed   HPV Vaccine  Aged Out   Colon Cancer Screening  Discontinued   COVID-19 Vaccine  Discontinued   Zoster (Shingles) Vaccine  Discontinued    Advanced directives: (Declined) Advance directive discussed with you today. Even though you declined this today, please call our office should you change your mind, and we can give you the proper paperwork for you to fill out.  Next Medicare Annual Wellness Visit scheduled for next year: Yes

## 2023-08-26 DIAGNOSIS — M069 Rheumatoid arthritis, unspecified: Secondary | ICD-10-CM | POA: Diagnosis not present

## 2023-08-26 DIAGNOSIS — E2740 Unspecified adrenocortical insufficiency: Secondary | ICD-10-CM | POA: Diagnosis not present

## 2023-08-26 DIAGNOSIS — M51369 Other intervertebral disc degeneration, lumbar region without mention of lumbar back pain or lower extremity pain: Secondary | ICD-10-CM | POA: Diagnosis not present

## 2023-08-26 DIAGNOSIS — Z9889 Other specified postprocedural states: Secondary | ICD-10-CM | POA: Diagnosis not present

## 2023-09-01 DIAGNOSIS — Z79899 Other long term (current) drug therapy: Secondary | ICD-10-CM | POA: Diagnosis not present

## 2023-09-01 DIAGNOSIS — E663 Overweight: Secondary | ICD-10-CM | POA: Diagnosis not present

## 2023-09-01 DIAGNOSIS — Z6829 Body mass index (BMI) 29.0-29.9, adult: Secondary | ICD-10-CM | POA: Diagnosis not present

## 2023-09-01 DIAGNOSIS — M1991 Primary osteoarthritis, unspecified site: Secondary | ICD-10-CM | POA: Diagnosis not present

## 2023-09-01 DIAGNOSIS — M7989 Other specified soft tissue disorders: Secondary | ICD-10-CM | POA: Diagnosis not present

## 2023-09-01 DIAGNOSIS — M0609 Rheumatoid arthritis without rheumatoid factor, multiple sites: Secondary | ICD-10-CM | POA: Diagnosis not present

## 2023-09-01 DIAGNOSIS — R5382 Chronic fatigue, unspecified: Secondary | ICD-10-CM | POA: Diagnosis not present

## 2023-09-01 DIAGNOSIS — M545 Low back pain, unspecified: Secondary | ICD-10-CM | POA: Diagnosis not present

## 2023-09-27 DIAGNOSIS — M545 Low back pain, unspecified: Secondary | ICD-10-CM | POA: Diagnosis not present

## 2023-09-27 DIAGNOSIS — M5416 Radiculopathy, lumbar region: Secondary | ICD-10-CM | POA: Diagnosis not present

## 2023-09-27 DIAGNOSIS — M25552 Pain in left hip: Secondary | ICD-10-CM | POA: Diagnosis not present

## 2023-09-27 DIAGNOSIS — G8929 Other chronic pain: Secondary | ICD-10-CM | POA: Diagnosis not present

## 2023-09-27 DIAGNOSIS — Z9889 Other specified postprocedural states: Secondary | ICD-10-CM | POA: Diagnosis not present

## 2023-09-27 DIAGNOSIS — M51369 Other intervertebral disc degeneration, lumbar region without mention of lumbar back pain or lower extremity pain: Secondary | ICD-10-CM | POA: Diagnosis not present

## 2023-10-12 DIAGNOSIS — M5416 Radiculopathy, lumbar region: Secondary | ICD-10-CM | POA: Diagnosis not present

## 2023-11-29 ENCOUNTER — Ambulatory Visit (INDEPENDENT_AMBULATORY_CARE_PROVIDER_SITE_OTHER): Payer: PPO | Admitting: Internal Medicine

## 2023-11-29 ENCOUNTER — Other Ambulatory Visit: Payer: Self-pay

## 2023-11-29 ENCOUNTER — Encounter: Payer: Self-pay | Admitting: Internal Medicine

## 2023-11-29 VITALS — BP 122/60 | HR 69 | Temp 97.9°F | Wt 144.0 lb

## 2023-11-29 DIAGNOSIS — M0600 Rheumatoid arthritis without rheumatoid factor, unspecified site: Secondary | ICD-10-CM | POA: Diagnosis not present

## 2023-11-29 DIAGNOSIS — E538 Deficiency of other specified B group vitamins: Secondary | ICD-10-CM | POA: Diagnosis not present

## 2023-11-29 DIAGNOSIS — I2510 Atherosclerotic heart disease of native coronary artery without angina pectoris: Secondary | ICD-10-CM | POA: Diagnosis not present

## 2023-11-29 DIAGNOSIS — Z0000 Encounter for general adult medical examination without abnormal findings: Secondary | ICD-10-CM | POA: Diagnosis not present

## 2023-11-29 DIAGNOSIS — D485 Neoplasm of uncertain behavior of skin: Secondary | ICD-10-CM | POA: Diagnosis not present

## 2023-11-29 DIAGNOSIS — M25552 Pain in left hip: Secondary | ICD-10-CM

## 2023-11-29 DIAGNOSIS — E559 Vitamin D deficiency, unspecified: Secondary | ICD-10-CM | POA: Diagnosis not present

## 2023-11-29 DIAGNOSIS — E785 Hyperlipidemia, unspecified: Secondary | ICD-10-CM | POA: Diagnosis not present

## 2023-11-29 DIAGNOSIS — I2583 Coronary atherosclerosis due to lipid rich plaque: Secondary | ICD-10-CM | POA: Diagnosis not present

## 2023-11-29 DIAGNOSIS — M48061 Spinal stenosis, lumbar region without neurogenic claudication: Secondary | ICD-10-CM

## 2023-11-29 LAB — URINALYSIS, ROUTINE W REFLEX MICROSCOPIC
Bilirubin Urine: NEGATIVE
Hgb urine dipstick: NEGATIVE
Nitrite: NEGATIVE
Specific Gravity, Urine: 1.025 (ref 1.000–1.030)
Urine Glucose: NEGATIVE
Urobilinogen, UA: 0.2 (ref 0.0–1.0)
pH: 6 (ref 5.0–8.0)

## 2023-11-29 LAB — COMPREHENSIVE METABOLIC PANEL WITH GFR
ALT: 12 U/L (ref 0–35)
AST: 20 U/L (ref 0–37)
Albumin: 3.9 g/dL (ref 3.5–5.2)
Alkaline Phosphatase: 91 U/L (ref 39–117)
BUN: 15 mg/dL (ref 6–23)
CO2: 30 meq/L (ref 19–32)
Calcium: 9.3 mg/dL (ref 8.4–10.5)
Chloride: 101 meq/L (ref 96–112)
Creatinine, Ser: 0.63 mg/dL (ref 0.40–1.20)
GFR: 86.07 mL/min (ref >60.00)
Glucose, Bld: 97 mg/dL (ref 70–99)
Potassium: 4 meq/L (ref 3.5–5.1)
Sodium: 137 meq/L (ref 135–145)
Total Bilirubin: 0.3 mg/dL (ref 0.2–1.2)
Total Protein: 7.2 g/dL (ref 6.0–8.3)

## 2023-11-29 LAB — VITAMIN D 25 HYDROXY (VIT D DEFICIENCY, FRACTURES): VITD: 25.4 ng/mL — ABNORMAL LOW (ref 30.00–100.00)

## 2023-11-29 LAB — CBC WITH DIFFERENTIAL/PLATELET
Basophils Absolute: 0 10*3/uL (ref 0.0–0.1)
Basophils Relative: 0.4 % (ref 0.0–3.0)
Eosinophils Absolute: 0.2 10*3/uL (ref 0.0–0.7)
Eosinophils Relative: 2 % (ref 0.0–5.0)
HCT: 42 % (ref 36.0–46.0)
Hemoglobin: 13.8 g/dL (ref 12.0–15.0)
Lymphocytes Relative: 29.1 % (ref 12.0–46.0)
Lymphs Abs: 2.2 10*3/uL (ref 0.7–4.0)
MCHC: 32.9 g/dL (ref 30.0–36.0)
MCV: 85.1 fl (ref 78.0–100.0)
Monocytes Absolute: 0.8 10*3/uL (ref 0.1–1.0)
Monocytes Relative: 9.8 % (ref 3.0–12.0)
Neutro Abs: 4.5 10*3/uL (ref 1.4–7.7)
Neutrophils Relative %: 58.7 % (ref 43.0–77.0)
Platelets: 241 10*3/uL (ref 150.0–400.0)
RBC: 4.93 Mil/uL (ref 3.87–5.11)
RDW: 14.2 % (ref 11.5–15.5)
WBC: 7.7 10*3/uL (ref 4.0–10.5)

## 2023-11-29 LAB — LIPID PANEL
Cholesterol: 242 mg/dL — ABNORMAL HIGH (ref 0–200)
HDL: 46.2 mg/dL (ref >39.00)
LDL Cholesterol: 161 mg/dL — ABNORMAL HIGH (ref 0–99)
NonHDL: 196.12
Total CHOL/HDL Ratio: 5
Triglycerides: 176 mg/dL — ABNORMAL HIGH (ref 0.0–149.0)
VLDL: 35.2 mg/dL (ref 0.0–40.0)

## 2023-11-29 LAB — VITAMIN B12: Vitamin B-12: 234 pg/mL (ref 211–911)

## 2023-11-29 LAB — TSH: TSH: 2.25 u[IU]/mL (ref 0.35–5.50)

## 2023-11-29 NOTE — Assessment & Plan Note (Signed)
 Not taking Vit B12 x 1 year Check labs

## 2023-11-29 NOTE — Assessment & Plan Note (Signed)
 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

## 2023-11-29 NOTE — Assessment & Plan Note (Signed)
Remote No CP

## 2023-11-29 NOTE — Progress Notes (Signed)
 Subjective:  Patient ID: Debra Farmer, female    DOB: 1946-06-13  Age: 78 y.o. MRN: [id number]  CC: Annual Exam (Pt wants to discuss starting B12 shots and have labs done is fasting.)   HPI Debra Farmer presents for chronic pain, RA, insomnia, B12 def  Outpatient Medications Prior to Visit  Medication Sig Dispense Refill   aspirin  EC 81 MG tablet Take 81 mg by mouth daily. Swallow whole.     furosemide  (LASIX ) 20 MG tablet TAKE 1-2 TABLETS BY MOUTH DAILY AS NEEDED FOR EDEMA 120 tablet 1   linaclotide  (LINZESS ) 290 MCG CAPS capsule Take 1 capsule (290 mcg total) by mouth daily. (Patient taking differently: Take 290 mcg by mouth daily as needed (constipation).) 30 capsule 11   Multiple Vitamins-Minerals (IMMUNE SUPPORT PO) Take 1 tablet by mouth daily.     nitroGLYCERIN  (NITROSTAT ) 0.4 MG SL tablet Place 1 tablet (0.4 mg total) under the tongue every 5 (five) minutes x 3 doses as needed for chest pain. 25 tablet 1   ondansetron  (ZOFRAN ) 4 MG tablet Take 1 tablet (4 mg total) by mouth every 8 (eight) hours as needed for nausea or vomiting. 30 tablet 0   phentermine  37.5 MG capsule Take 1 capsule (37.5 mg total) by mouth every morning. 30 capsule 2   potassium chloride  (KLOR-CON  M) 10 MEQ tablet Take 1 tablet (10 mEq total) by mouth daily. (Patient taking differently: Take 10 mEq by mouth daily as needed (when taking furosemide ).) 90 tablet 3   predniSONE  (DELTASONE ) 1 MG tablet Take 4 mg by mouth daily with breakfast.     traMADol  (ULTRAM ) 50 MG tablet Take 1 tablet (50 mg total) by mouth every 6 (six) hours. 120 tablet 3   zolpidem  (AMBIEN ) 10 MG tablet take 1 tablet by mouth at bedime for sleep 90 tablet 1   No facility-administered medications prior to visit.    ROS: Review of Systems  Constitutional:  Positive for fatigue. Negative for activity change, appetite change, chills and unexpected weight change.  HENT:  Negative for congestion, mouth sores and sinus pressure.    Eyes:  Negative for visual disturbance.  Respiratory:  Negative for cough and chest tightness.   Gastrointestinal:  Negative for abdominal pain and nausea.  Genitourinary:  Negative for difficulty urinating, frequency and vaginal pain.  Musculoskeletal:  Positive for arthralgias, back pain and gait problem.  Skin:  Negative for pallor and rash.  Neurological:  Negative for dizziness, tremors, weakness, numbness and headaches.  Psychiatric/Behavioral:  Positive for sleep disturbance. Negative for confusion and suicidal ideas. The patient is nervous/anxious.     Objective:  BP 122/60   Pulse 69   Temp 97.9 F (36.6 C) (Oral)   Wt 144 lb (65.3 kg)   SpO2 95%   BMI 28.12 kg/m   BP Readings from Last 3 Encounters:  11/29/23 122/60  06/23/23 110/60  05/13/23 (!) 163/88    Wt Readings from Last 3 Encounters:  11/29/23 144 lb (65.3 kg)  08/13/23 143 lb (64.9 kg)  06/23/23 150 lb (68 kg)    Physical Exam Constitutional:      General: She is not in acute distress.    Appearance: Normal appearance. She is well-developed. She is not toxic-appearing.  HENT:     Head: Normocephalic.     Right Ear: External ear normal.     Left Ear: External ear normal.     Nose: Nose normal.   Eyes:     General:  Right eye: No discharge.        Left eye: No discharge.     Conjunctiva/sclera: Conjunctivae normal.     Pupils: Pupils are equal, round, and reactive to light.   Neck:     Thyroid : No thyromegaly.     Vascular: No JVD.     Trachea: No tracheal deviation.   Cardiovascular:     Rate and Rhythm: Normal rate and regular rhythm.     Heart sounds: Normal heart sounds.  Pulmonary:     Effort: No respiratory distress.     Breath sounds: No stridor. No wheezing.  Abdominal:     General: Bowel sounds are normal. There is no distension.     Palpations: Abdomen is soft. There is no mass.     Tenderness: There is no abdominal tenderness. There is no guarding or rebound.    Musculoskeletal:        General: Tenderness present.     Cervical back: Normal range of motion and neck supple. No rigidity.     Right lower leg: No edema.     Left lower leg: No edema.  Lymphadenopathy:     Cervical: No cervical adenopathy.   Skin:    Findings: No erythema or rash.   Neurological:     Cranial Nerves: No cranial nerve deficit.     Motor: No abnormal muscle tone.     Coordination: Coordination normal.     Deep Tendon Reflexes: Reflexes normal.   Psychiatric:        Behavior: Behavior normal.        Thought Content: Thought content normal.        Judgment: Judgment normal.   LS spine w/pain L anterolat prox hip w/eryth skin infiltrate           Lab Results  Component Value Date   WBC 9.7 02/25/2023   HGB 13.0 02/25/2023   HCT 41.6 02/25/2023   PLT 278 02/25/2023   GLUCOSE 102 (H) 02/25/2023   CHOL 275 (H) 07/20/2020   TRIG 321 (H) 07/20/2020   HDL 40 (L) 07/20/2020   LDLDIRECT 170.0 12/05/2014   LDLCALC 171 (H) 07/20/2020   ALT 15 11/30/2022   AST 20 11/30/2022   NA 138 02/25/2023   K 3.7 02/25/2023   CL 104 02/25/2023   CREATININE 0.62 02/25/2023   BUN 13 02/25/2023   CO2 25 02/25/2023   TSH 1.83 01/23/2020   INR 0.9 02/25/2023   HGBA1C 5.9 (H) 07/20/2020    DG HIP PORT UNILAT WITH PELVIS 1V LEFT Result Date: 05/13/2023 CLINICAL DATA:  Post reduction EXAM: DG HIP (WITH OR WITHOUT PELVIS) 1V PORT LEFT COMPARISON:  05/13/2023 FINDINGS: Bilateral hip replacements. Interval reduction of previously noted dislocated left femoral component now with normal alignment. No definitive fracture IMPRESSION: Interval reduction of previously noted dislocated left femoral component. Electronically Signed   By: Debra Farmer M.D.   On: 05/13/2023 23:03   DG Hip Unilat With Pelvis 2-3 Views Left Result Date: 05/13/2023 CLINICAL DATA:  Left hip dislocation EXAM: DG HIP (WITH OR WITHOUT PELVIS) 2-3V LEFT COMPARISON:  02/25/2023 FINDINGS: Left total hip  arthroplasty has been performed. There is anterosuperior dislocation of the left hip are. No superimposed fracture identified. L4-5 lumbar fusion with instrumentation and right total hip arthroplasty are incidentally noted. Sacroiliac joint spaces are preserved. Soft tissues are unremarkable. IMPRESSION: 1. 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 Electronically Signed   By: Worthy Heads M.D.   On: 05/13/2023  20:54    Assessment & Plan:   Problem List Items Addressed This Visit     Dyslipidemia   On  Red Rice yeast      Left hip pain   Dr Rexanne Catalina - on Tramadol  50 mg 8/day Appt w/Dr Meredith Stalls, Dr Charol Copas is pending F/u w/Dr Rexanne Catalina L anterolat prox hip w/eryth skin infiltrate since Nov 2024 Hip X ray was ok      Well adult exam    We discussed age appropriate health related issues, including available/recomended screening tests and vaccinations. Labs were ordered to be later reviewed . All questions were answered. We discussed one or more of the following - seat belt use, use of sunscreen/sun exposure exercise, fall risk reduction, second hand smoke exposure, firearm use and storage, seat belt use, a need for adhering to healthy diet and exercise. Labs were ordered.  All questions were answered.  Mammogram S/p complete hysterectomy Colonoscopy or cologuard adviced      Seronegative rheumatoid arthritis (HCC) - Primary   F/u w/Dr Meredith Stalls Pt wants to go back on Humira       Spinal stenosis at L4-L5 level   Tramadol  prn  Potential benefits of a long term opioids use as well as potential risks (i.e. addiction risk, apnea etc) and complications (i.e. Somnolence, constipation and others) were explained to the patient and were aknowledged.  S/p PLIF L4-5.        Coronary artery disease   Remote No CP      Vitamin B12 deficiency   Not taking Vit B12 x 1 year Check labs         No orders of the defined types were placed in this encounter.     Follow-up: No  follow-ups on file.  Anitra Barn, MD

## 2023-11-29 NOTE — Assessment & Plan Note (Signed)
 Dr Rexanne Catalina - on Tramadol  50 mg 8/day Appt w/Dr Meredith Stalls, Dr Charol Copas is pending F/u w/Dr Rexanne Catalina L anterolat prox hip w/eryth skin infiltrate since Nov 2024 Hip X ray was ok

## 2023-11-29 NOTE — Assessment & Plan Note (Signed)
On  Red Rice yeast 

## 2023-11-29 NOTE — Progress Notes (Signed)
 Subjective:  Patient ID: Debra Farmer, female    DOB: 06-11-1946  Age: 78 y.o. MRN: [id number]  CC: Annual Exam (Pt wants to discuss starting B12 shots and have labs done is fasting.)   HPI Debra Farmer presents for a well exam F/u on RA, insomnia  Outpatient Medications Prior to Visit  Medication Sig Dispense Refill   aspirin  EC 81 MG tablet Take 81 mg by mouth daily. Swallow whole.     furosemide  (LASIX ) 20 MG tablet TAKE 1-2 TABLETS BY MOUTH DAILY AS NEEDED FOR EDEMA 120 tablet 1   linaclotide  (LINZESS ) 290 MCG CAPS capsule Take 1 capsule (290 mcg total) by mouth daily. (Patient taking differently: Take 290 mcg by mouth daily as needed (constipation).) 30 capsule 11   Multiple Vitamins-Minerals (IMMUNE SUPPORT PO) Take 1 tablet by mouth daily.     nitroGLYCERIN  (NITROSTAT ) 0.4 MG SL tablet Place 1 tablet (0.4 mg total) under the tongue every 5 (five) minutes x 3 doses as needed for chest pain. 25 tablet 1   ondansetron  (ZOFRAN ) 4 MG tablet Take 1 tablet (4 mg total) by mouth every 8 (eight) hours as needed for nausea or vomiting. 30 tablet 0   phentermine  37.5 MG capsule Take 1 capsule (37.5 mg total) by mouth every morning. 30 capsule 2   potassium chloride  (KLOR-CON  M) 10 MEQ tablet Take 1 tablet (10 mEq total) by mouth daily. (Patient taking differently: Take 10 mEq by mouth daily as needed (when taking furosemide ).) 90 tablet 3   predniSONE  (DELTASONE ) 1 MG tablet Take 4 mg by mouth daily with breakfast.     traMADol  (ULTRAM ) 50 MG tablet Take 1 tablet (50 mg total) by mouth every 6 (six) hours. 120 tablet 3   zolpidem  (AMBIEN ) 10 MG tablet take 1 tablet by mouth at bedime for sleep 90 tablet 1   No facility-administered medications prior to visit.    ROS: Review of Systems  Constitutional:  Negative for activity change, appetite change, chills, fatigue and unexpected weight change.  HENT:  Negative for congestion, mouth sores and sinus pressure.   Eyes:  Negative  for visual disturbance.  Respiratory:  Negative for cough and chest tightness.   Gastrointestinal:  Negative for abdominal pain and nausea.  Genitourinary:  Negative for difficulty urinating, frequency and vaginal pain.  Musculoskeletal:  Positive for arthralgias and back pain. Negative for gait problem.  Skin:  Positive for color change. Negative for pallor and rash.  Neurological:  Negative for dizziness, tremors, weakness, numbness and headaches.  Psychiatric/Behavioral:  Negative for confusion and sleep disturbance. The patient is not nervous/anxious.     Objective:  BP 122/60   Pulse 69   Temp 97.9 F (36.6 C) (Oral)   Wt 144 lb (65.3 kg)   SpO2 95%   BMI 28.12 kg/m   BP Readings from Last 3 Encounters:  11/29/23 122/60  06/23/23 110/60  05/13/23 (!) 163/88    Wt Readings from Last 3 Encounters:  11/29/23 144 lb (65.3 kg)  08/13/23 143 lb (64.9 kg)  06/23/23 150 lb (68 kg)    Physical Exam Constitutional:      General: She is not in acute distress.    Appearance: She is well-developed. She is not ill-appearing or toxic-appearing.  HENT:     Head: Normocephalic.     Right Ear: External ear normal.     Left Ear: External ear normal.     Nose: Nose normal.   Eyes:  General:        Right eye: No discharge.        Left eye: No discharge.     Conjunctiva/sclera: Conjunctivae normal.     Pupils: Pupils are equal, round, and reactive to light.   Neck:     Thyroid : No thyromegaly.     Vascular: No JVD.     Trachea: No tracheal deviation.   Cardiovascular:     Rate and Rhythm: Normal rate and regular rhythm.     Heart sounds: Normal heart sounds.  Pulmonary:     Effort: No respiratory distress.     Breath sounds: No stridor. No wheezing.  Abdominal:     General: Bowel sounds are normal. There is no distension.     Palpations: Abdomen is soft. There is no mass.     Tenderness: There is no abdominal tenderness. There is no guarding or rebound.    Musculoskeletal:        General: Tenderness present.     Cervical back: Normal range of motion and neck supple. No rigidity.     Right lower leg: No edema.     Left lower leg: No edema.  Lymphadenopathy:     Cervical: No cervical adenopathy.   Skin:    Findings: No erythema or rash.   Neurological:     Mental Status: She is oriented to person, place, and time.     Cranial Nerves: No cranial nerve deficit.     Motor: No abnormal muscle tone.     Coordination: Coordination normal.     Gait: Gait abnormal.     Deep Tendon Reflexes: Reflexes normal.   Psychiatric:        Behavior: Behavior normal.        Thought Content: Thought content normal.        Judgment: Judgment normal.     Lab Results  Component Value Date   WBC 9.7 02/25/2023   HGB 13.0 02/25/2023   HCT 41.6 02/25/2023   PLT 278 02/25/2023   GLUCOSE 102 (H) 02/25/2023   CHOL 275 (H) 07/20/2020   TRIG 321 (H) 07/20/2020   HDL 40 (L) 07/20/2020   LDLDIRECT 170.0 12/05/2014   LDLCALC 171 (H) 07/20/2020   ALT 15 11/30/2022   AST 20 11/30/2022   NA 138 02/25/2023   K 3.7 02/25/2023   CL 104 02/25/2023   CREATININE 0.62 02/25/2023   BUN 13 02/25/2023   CO2 25 02/25/2023   TSH 1.83 01/23/2020   INR 0.9 02/25/2023   HGBA1C 5.9 (H) 07/20/2020    DG HIP PORT UNILAT WITH PELVIS 1V LEFT Result Date: 05/13/2023 CLINICAL DATA:  Post reduction EXAM: DG HIP (WITH OR WITHOUT PELVIS) 1V PORT LEFT COMPARISON:  05/13/2023 FINDINGS: Bilateral hip replacements. Interval reduction of previously noted dislocated left femoral component now with normal alignment. No definitive fracture IMPRESSION: Interval reduction of previously noted dislocated left femoral component. Electronically Signed   By: Esmeralda Hedge M.D.   On: 05/13/2023 23:03   DG Hip Unilat With Pelvis 2-3 Views Left Result Date: 05/13/2023 CLINICAL DATA:  Left hip dislocation EXAM: DG HIP (WITH OR WITHOUT PELVIS) 2-3V LEFT COMPARISON:  02/25/2023 FINDINGS: Left  total hip arthroplasty has been performed. There is anterosuperior dislocation of the left hip are. No superimposed fracture identified. L4-5 lumbar fusion with instrumentation and right total hip arthroplasty are incidentally noted. Sacroiliac joint spaces are preserved. Soft tissues are unremarkable. IMPRESSION: 1. 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 Electronically Signed  By: Worthy Heads M.D.   On: 05/13/2023 20:54    Assessment & Plan:   Problem List Items Addressed This Visit     Dyslipidemia   On  Red Rice yeast      Left hip pain   Dr Rexanne Catalina - on Tramadol  50 mg 8/day Appt w/Dr Meredith Stalls, Dr Charol Copas is pending F/u w/Dr Rexanne Catalina L anterolat prox hip w/eryth skin infiltrate since Nov 2024 Hip X ray was ok      Well adult exam - Primary    We discussed age appropriate health related issues, including available/recomended screening tests and vaccinations. Labs were ordered to be later reviewed . All questions were answered. We discussed one or more of the following - seat belt use, use of sunscreen/sun exposure exercise, fall risk reduction, second hand smoke exposure, firearm use and storage, seat belt use, a need for adhering to healthy diet and exercise. Labs were ordered.  All questions were answered.  Mammogram S/p complete hysterectomy Colonoscopy or cologuard adviced      Relevant Orders   TSH   Urinalysis   CBC with Differential/Platelet   Lipid panel   Comprehensive metabolic panel with GFR   VITAMIN D  25 Hydroxy (Vit-D Deficiency, Fractures)   Vitamin B12   Seronegative rheumatoid arthritis (HCC)   F/u w/Dr Meredith Stalls Pt wants to go back on Humira       Spinal stenosis at L4-L5 level   Tramadol  prn  Potential benefits of a long term opioids use as well as potential risks (i.e. addiction risk, apnea etc) and complications (i.e. Somnolence, constipation and others) were explained to the patient and were aknowledged.  S/p PLIF L4-5.        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     Follow-up: No follow-ups on file.  Anitra Barn, MD

## 2023-11-29 NOTE — Assessment & Plan Note (Signed)
Tramadol prn  Potential benefits of a long term opioids use as well as potential risks (i.e. addiction risk, apnea etc) and complications (i.e. Somnolence, constipation and others) were explained to the patient and were aknowledged.  S/p PLIF L4-5.

## 2023-11-29 NOTE — Assessment & Plan Note (Signed)
 F/u w/Dr Nickola Major Pt wants to go back on Humira

## 2023-11-29 NOTE — Assessment & Plan Note (Addendum)
  We discussed age appropriate health related issues, including available/recomended screening tests and vaccinations. Labs were ordered to be later reviewed . All questions were answered. We discussed one or more of the following - seat belt use, use of sunscreen/sun exposure exercise, fall risk reduction, second hand smoke exposure, firearm use and storage, seat belt use, a need for adhering to healthy diet and exercise. Labs were ordered.  All questions were answered.  Mammogram S/p complete hysterectomy Colonoscopy or cologuard adviced

## 2023-12-02 ENCOUNTER — Other Ambulatory Visit: Payer: Self-pay | Admitting: Internal Medicine

## 2023-12-02 ENCOUNTER — Ambulatory Visit: Payer: Self-pay | Admitting: Internal Medicine

## 2023-12-02 DIAGNOSIS — R5382 Chronic fatigue, unspecified: Secondary | ICD-10-CM | POA: Diagnosis not present

## 2023-12-02 DIAGNOSIS — M545 Low back pain, unspecified: Secondary | ICD-10-CM | POA: Diagnosis not present

## 2023-12-02 DIAGNOSIS — Z79899 Other long term (current) drug therapy: Secondary | ICD-10-CM | POA: Diagnosis not present

## 2023-12-02 DIAGNOSIS — M1991 Primary osteoarthritis, unspecified site: Secondary | ICD-10-CM | POA: Diagnosis not present

## 2023-12-02 DIAGNOSIS — M7989 Other specified soft tissue disorders: Secondary | ICD-10-CM | POA: Diagnosis not present

## 2023-12-02 DIAGNOSIS — Z6828 Body mass index (BMI) 28.0-28.9, adult: Secondary | ICD-10-CM | POA: Diagnosis not present

## 2023-12-02 DIAGNOSIS — E663 Overweight: Secondary | ICD-10-CM | POA: Diagnosis not present

## 2023-12-02 DIAGNOSIS — M0609 Rheumatoid arthritis without rheumatoid factor, multiple sites: Secondary | ICD-10-CM | POA: Diagnosis not present

## 2023-12-02 MED ORDER — VITAMIN B-12 1000 MCG SL SUBL: 1.0000 | SUBLINGUAL_TABLET | Freq: Every day | SUBLINGUAL | 3 refills | Status: DC

## 2023-12-02 MED ORDER — VITAMIN D (ERGOCALCIFEROL) 1.25 MG (50000 UNIT) PO CAPS
50000.0000 [IU] | ORAL_CAPSULE | ORAL | 3 refills | Status: AC
Start: 2023-12-02 — End: ?

## 2023-12-14 DIAGNOSIS — M47816 Spondylosis without myelopathy or radiculopathy, lumbar region: Secondary | ICD-10-CM | POA: Diagnosis not present

## 2023-12-14 DIAGNOSIS — Z79899 Other long term (current) drug therapy: Secondary | ICD-10-CM | POA: Diagnosis not present

## 2023-12-14 DIAGNOSIS — M51369 Other intervertebral disc degeneration, lumbar region without mention of lumbar back pain or lower extremity pain: Secondary | ICD-10-CM | POA: Diagnosis not present

## 2023-12-21 DIAGNOSIS — Z96642 Presence of left artificial hip joint: Secondary | ICD-10-CM | POA: Diagnosis not present

## 2023-12-21 DIAGNOSIS — M7062 Trochanteric bursitis, left hip: Secondary | ICD-10-CM | POA: Diagnosis not present

## 2024-01-02 ENCOUNTER — Encounter (HOSPITAL_COMMUNITY): Payer: Self-pay | Admitting: *Deleted

## 2024-01-02 ENCOUNTER — Emergency Department (HOSPITAL_COMMUNITY)

## 2024-01-02 ENCOUNTER — Emergency Department (HOSPITAL_COMMUNITY)
Admission: EM | Admit: 2024-01-02 | Discharge: 2024-01-02 | Disposition: A | Discharge status: Home / Self Care | Attending: Emergency Medicine | Admitting: Emergency Medicine

## 2024-01-02 ENCOUNTER — Other Ambulatory Visit: Payer: Self-pay

## 2024-01-02 DIAGNOSIS — I10 Essential (primary) hypertension: Secondary | ICD-10-CM | POA: Diagnosis not present

## 2024-01-02 DIAGNOSIS — Y9222 Religious institution as the place of occurrence of the external cause: Secondary | ICD-10-CM | POA: Diagnosis not present

## 2024-01-02 DIAGNOSIS — R0689 Other abnormalities of breathing: Secondary | ICD-10-CM | POA: Diagnosis not present

## 2024-01-02 DIAGNOSIS — R61 Generalized hyperhidrosis: Secondary | ICD-10-CM | POA: Diagnosis not present

## 2024-01-02 DIAGNOSIS — S73005A Unspecified dislocation of left hip, initial encounter: Secondary | ICD-10-CM | POA: Diagnosis not present

## 2024-01-02 DIAGNOSIS — X501XXA Overexertion from prolonged static or awkward postures, initial encounter: Secondary | ICD-10-CM | POA: Diagnosis not present

## 2024-01-02 DIAGNOSIS — Z96642 Presence of left artificial hip joint: Secondary | ICD-10-CM | POA: Insufficient documentation

## 2024-01-02 DIAGNOSIS — S79912A Unspecified injury of left hip, initial encounter: Secondary | ICD-10-CM | POA: Diagnosis present

## 2024-01-02 DIAGNOSIS — T84021A Dislocation of internal left hip prosthesis, initial encounter: Secondary | ICD-10-CM | POA: Diagnosis not present

## 2024-01-02 DIAGNOSIS — Z7982 Long term (current) use of aspirin: Secondary | ICD-10-CM | POA: Diagnosis not present

## 2024-01-02 DIAGNOSIS — S73015A Posterior dislocation of left hip, initial encounter: Secondary | ICD-10-CM | POA: Diagnosis not present

## 2024-01-02 DIAGNOSIS — Z96643 Presence of artificial hip joint, bilateral: Secondary | ICD-10-CM | POA: Diagnosis not present

## 2024-01-02 MED ORDER — OXYCODONE-ACETAMINOPHEN 5-325 MG PO TABS: 1.0000 | ORAL_TABLET | Freq: Four times a day (QID) | ORAL | 0 refills | Status: DC | PRN

## 2024-01-02 MED ORDER — PROPOFOL 10 MG/ML IV BOLUS
0.5000 mg/kg | Freq: Once | INTRAVENOUS | Status: DC
  Filled 2024-01-02: qty 20

## 2024-01-02 MED ORDER — KETAMINE HCL 10 MG/ML IJ SOLN
INTRAMUSCULAR | Status: AC | PRN
  Administered 2024-01-02: 30 mg via INTRAVENOUS

## 2024-01-02 MED ORDER — KETAMINE HCL 10 MG/ML IJ SOLN
0.3000 mg/kg | Freq: Once | INTRAMUSCULAR | Status: DC
  Filled 2024-01-02: qty 1

## 2024-01-02 MED ORDER — PROPOFOL 10 MG/ML IV BOLUS
INTRAVENOUS | Status: AC | PRN
  Administered 2024-01-02 (×2): 20 mg via INTRAVENOUS

## 2024-01-02 NOTE — ED Provider Notes (Signed)
  EMERGENCY DEPARTMENT AT [facility] Provider Note   CSN: [id number] Arrival date & time: 01/02/24  1212     Patient presents with: No chief complaint on file.   Debra Farmer is a 78 y.o. female.   78 year old presents with left hip pain.  States that she was bending over and pop in her left hip.  History of hip dislocation x 2 in the past.  She scheduled to have a hip replacement in the near future.  Called EMS and was given fentanyl  and transported here.  Denies any new weakness to her left foot       Prior to Admission medications   Medication Sig Start Date End Date Taking? Authorizing Provider  aspirin  EC 81 MG tablet Take 81 mg by mouth daily. Swallow whole.    [provider]  Cyanocobalamin  (VITAMIN B-12) 1000 MCG SUBL Place 1 tablet (1,000 mcg total) under the tongue daily. 12/02/23   Plotnikov, Aleksei V, MD  ENBREL SURECLICK 50 MG/ML injection  10/02/23   [provider]  furosemide  (LASIX ) 20 MG tablet TAKE 1-2 TABLETS BY MOUTH DAILY AS NEEDED FOR EDEMA 06/23/23   Plotnikov, Aleksei V, MD  hydroxychloroquine (PLAQUENIL) 200 MG tablet Take 200 mg by mouth 2 (two) times daily. 10/01/23   [provider]  leflunomide  (ARAVA ) 20 MG tablet Take 20 mg by mouth daily.    [provider]  linaclotide  (LINZESS ) 290 MCG CAPS capsule Take 1 capsule (290 mcg total) by mouth daily. Patient taking differently: Take 290 mcg by mouth daily as needed (constipation). 11/11/21   Plotnikov, Aleksei V, MD  Multiple Vitamins-Minerals (IMMUNE SUPPORT PO) Take 1 tablet by mouth daily.    [provider]  nitroGLYCERIN  (NITROSTAT ) 0.4 MG SL tablet Place 1 tablet (0.4 mg total) under the tongue every 5 (five) minutes x 3 doses as needed for chest pain. 07/21/20   Claudene Pacific, MD  ondansetron  (ZOFRAN ) 4 MG tablet Take 1 tablet (4 mg total) by mouth every 8 (eight) hours as needed for nausea or vomiting. 01/07/23 01/07/24  Leigh Valery RAMAN,  PA-C  phentermine  37.5 MG capsule Take 1 capsule (37.5 mg total) by mouth every morning. 06/23/23   Plotnikov, Aleksei V, MD  potassium chloride  (KLOR-CON  M) 10 MEQ tablet Take 1 tablet (10 mEq total) by mouth daily. Patient taking differently: Take 10 mEq by mouth daily as needed (when taking furosemide ). 11/30/22   Plotnikov, Aleksei V, MD  predniSONE  (DELTASONE ) 1 MG tablet Take 4 mg by mouth daily with breakfast.    [provider]  traMADol  (ULTRAM ) 50 MG tablet Take 1 tablet (50 mg total) by mouth every 6 (six) hours. 06/23/23   Plotnikov, Aleksei V, MD  Vitamin D , Ergocalciferol , (DRISDOL ) 1.25 MG (50000 UNIT) CAPS capsule Take 1 capsule (50,000 Units total) by mouth every 14 (fourteen) days. 12/02/23   Plotnikov, Karlynn GAILS, MD  zolpidem  (AMBIEN ) 10 MG tablet take 1 tablet by mouth at bedime for sleep 06/23/23   Plotnikov, Karlynn GAILS, MD    Allergies: Aspirin , Codeine sulfate, Diltiazem , Gabapentin , Lipitor [atorvastatin], Methotrexate , Miralax  [polyethylene glycol], Nabumetone, Plaquenil [hydroxychloroquine], Pravastatin , Sarilumab , Senna, and Statins    Review of Systems  All other systems reviewed and are negative.   Updated Vital Signs There were no vitals taken for this visit.  Physical Exam Vitals and nursing note reviewed.  Constitutional:      General: She is not in acute distress.    Appearance: Normal appearance. She  is well-developed. She is not toxic-appearing.  HENT:     Head: Normocephalic and atraumatic.  Eyes:     General: Lids are normal.     Conjunctiva/sclera: Conjunctivae normal.     Pupils: Pupils are equal, round, and reactive to light.  Neck:     Thyroid : No thyroid  mass.     Trachea: No tracheal deviation.  Cardiovascular:     Rate and Rhythm: Normal rate and regular rhythm.     Heart sounds: Normal heart sounds. No murmur heard.    No gallop.  Pulmonary:     Effort: Pulmonary effort is normal. No respiratory distress.     Breath sounds: Normal  breath sounds. No stridor. No decreased breath sounds, wheezing, rhonchi or rales.  Abdominal:     General: There is no distension.     Palpations: Abdomen is soft.     Tenderness: There is no abdominal tenderness. There is no rebound.  Musculoskeletal:        General: No tenderness. Normal range of motion.     Cervical back: Normal range of motion and neck supple.     Left hip: Deformity present.     Comments: Left lower extremity shortened and rotated.  Neurovasc intact left  Skin:    General: Skin is warm and dry.     Findings: No abrasion or rash.  Neurological:     Mental Status: She is alert and oriented to person, place, and time. Mental status is at baseline.     GCS: GCS eye subscore is 4. GCS verbal subscore is 5. GCS motor subscore is 6.     Cranial Nerves: No cranial nerve deficit.     Sensory: No sensory deficit.     Motor: Motor function is intact.  Psychiatric:        Attention and Perception: Attention normal.        Speech: Speech normal.        Behavior: Behavior normal.     (all labs ordered are listed, but only abnormal results are displayed) Labs Reviewed - No data to display  EKG: None  Radiology: No results found.   .Reduction of dislocation  Date/Time: 01/02/2024 1:36 PM  Performed by: Dasie Faden, MD Authorized by: Dasie Faden, MD  Consent: Written consent obtained Risks and benefits: risks, benefits and alternatives were discussed Time out: Immediately prior to procedure a time out was called to verify the correct patient, procedure, equipment, support staff and site/side marked as required. Preparation: Patient was prepped and draped in the usual sterile fashion. Local anesthesia used: no  Anesthesia: Local anesthesia used: no  Sedation: Patient sedated: yes Sedatives: propofol  Analgesia: ketamine  Sedation start date/time: 01/02/2024 1:10 PM Sedation end date/time: 01/02/2024 1:37 PM  Patient tolerance: patient tolerated the  procedure well with no immediate complications      Medications Ordered in the ED  ketamine  (KETALAR ) injection 0.3 mg/kg (has no administration in time range)  propofol  (DIPRIVAN ) 10 mg/mL bolus/IV push 0.5 mg/kg (has no administration in time range)                                    Medical Decision Making Amount and/or Complexity of Data Reviewed Radiology: ordered.  Risk Prescription drug management.   Patient's initial hip x-ray showed evidence of a dislocated left total hip arthroplasty.  Repeat x-ray after reduction shows relocation of the hip.  Patient monitored here  and is back to her baseline at this time.  Placed in knee immobilizer.  Will follow-up with Dr. Fidel     Final diagnoses:  None    ED Discharge Orders     None          Dasie Faden, MD 01/02/24 [phone]

## 2024-01-02 NOTE — ED Notes (Signed)
 Patient fully alert after sedation.  She is able to tolerate PO fluids without emesis.  She states that she is ready to go home. Her husband is at bedside. I let her know that I would pass the message to Dr. Dasie. She verbalized understanding.

## 2024-01-02 NOTE — Progress Notes (Signed)
 Respiratory Therapist at [facility]  in room number ___21_ during procedure.  Suction with [place] at [facility] set up and ready to use. Ambu bag at [facility] and ready to use.  Patient placed on ETCO2 Nasal Cannula at __2__ LPM.  Vitals at conclusion of procedure:  ETCO2 _25___ mmHg HR 88  RR 15 SPO2  100  Patient awake and able to verbalize name.

## 2024-01-02 NOTE — ED Triage Notes (Signed)
 Pt BIB [facility] and presents with a left hip dislocation. Per EMS was sitting in church in a pew.  She lowered down to pick up her glasses and felt her left hip pop out Pt reports that his has happen to her previously x 2. This incident feels like the same.   Pt a/o x 4  BP: 168/96, HR: 65, 94% HR, CO2 50, RR: 11 (After Fentanyl  vis EMS)  20g RAC started by EMS.

## 2024-01-03 ENCOUNTER — Other Ambulatory Visit: Payer: Self-pay | Admitting: Internal Medicine

## 2024-01-11 ENCOUNTER — Emergency Department (HOSPITAL_COMMUNITY): Admitting: Certified Registered Nurse Anesthetist

## 2024-01-11 ENCOUNTER — Emergency Department (HOSPITAL_COMMUNITY)

## 2024-01-11 ENCOUNTER — Ambulatory Visit (HOSPITAL_COMMUNITY)
Admission: EM | Admit: 2024-01-11 | Discharge: 2024-01-11 | Disposition: A | Discharge status: Home / Self Care | Attending: Emergency Medicine | Admitting: Emergency Medicine

## 2024-01-11 ENCOUNTER — Encounter (HOSPITAL_COMMUNITY)
Admission: EM | Disposition: A | Discharge status: Home / Self Care | Payer: Self-pay | Source: Home / Self Care | Attending: Emergency Medicine

## 2024-01-11 ENCOUNTER — Encounter (HOSPITAL_COMMUNITY): Payer: Self-pay

## 2024-01-11 DIAGNOSIS — Z955 Presence of coronary angioplasty implant and graft: Secondary | ICD-10-CM | POA: Insufficient documentation

## 2024-01-11 DIAGNOSIS — J449 Chronic obstructive pulmonary disease, unspecified: Secondary | ICD-10-CM | POA: Diagnosis not present

## 2024-01-11 DIAGNOSIS — R0902 Hypoxemia: Secondary | ICD-10-CM | POA: Diagnosis not present

## 2024-01-11 DIAGNOSIS — S73005A Unspecified dislocation of left hip, initial encounter: Secondary | ICD-10-CM | POA: Diagnosis not present

## 2024-01-11 DIAGNOSIS — Z96641 Presence of right artificial hip joint: Secondary | ICD-10-CM | POA: Diagnosis not present

## 2024-01-11 DIAGNOSIS — S73035A Other anterior dislocation of left hip, initial encounter: Secondary | ICD-10-CM | POA: Diagnosis not present

## 2024-01-11 DIAGNOSIS — X58XXXA Exposure to other specified factors, initial encounter: Secondary | ICD-10-CM | POA: Insufficient documentation

## 2024-01-11 DIAGNOSIS — I252 Old myocardial infarction: Secondary | ICD-10-CM | POA: Diagnosis not present

## 2024-01-11 DIAGNOSIS — M069 Rheumatoid arthritis, unspecified: Secondary | ICD-10-CM | POA: Diagnosis not present

## 2024-01-11 DIAGNOSIS — T84021A Dislocation of internal left hip prosthesis, initial encounter: Secondary | ICD-10-CM | POA: Diagnosis not present

## 2024-01-11 DIAGNOSIS — Z87891 Personal history of nicotine dependence: Secondary | ICD-10-CM | POA: Insufficient documentation

## 2024-01-11 DIAGNOSIS — Z96643 Presence of artificial hip joint, bilateral: Secondary | ICD-10-CM | POA: Diagnosis not present

## 2024-01-11 DIAGNOSIS — I739 Peripheral vascular disease, unspecified: Secondary | ICD-10-CM | POA: Insufficient documentation

## 2024-01-11 DIAGNOSIS — S73005D Unspecified dislocation of left hip, subsequent encounter: Secondary | ICD-10-CM | POA: Diagnosis not present

## 2024-01-11 DIAGNOSIS — Z96642 Presence of left artificial hip joint: Secondary | ICD-10-CM | POA: Diagnosis not present

## 2024-01-11 DIAGNOSIS — Z043 Encounter for examination and observation following other accident: Secondary | ICD-10-CM | POA: Diagnosis not present

## 2024-01-11 DIAGNOSIS — I2510 Atherosclerotic heart disease of native coronary artery without angina pectoris: Secondary | ICD-10-CM | POA: Insufficient documentation

## 2024-01-11 DIAGNOSIS — R609 Edema, unspecified: Secondary | ICD-10-CM | POA: Diagnosis not present

## 2024-01-11 DIAGNOSIS — I10 Essential (primary) hypertension: Secondary | ICD-10-CM | POA: Diagnosis not present

## 2024-01-11 DIAGNOSIS — Z981 Arthrodesis status: Secondary | ICD-10-CM | POA: Diagnosis not present

## 2024-01-11 HISTORY — PX: HIP CLOSED REDUCTION: SHX983

## 2024-01-11 LAB — BASIC METABOLIC PANEL WITH GFR
Anion gap: 9 (ref 5–15)
BUN: 17 mg/dL (ref 8–23)
CO2: 29 mmol/L (ref 22–32)
Calcium: 9.1 mg/dL (ref 8.9–10.3)
Chloride: 98 mmol/L (ref 98–111)
Creatinine, Ser: 0.61 mg/dL (ref 0.44–1.00)
GFR, Estimated: >60 mL/min (ref >60)
Glucose, Bld: 124 mg/dL — ABNORMAL HIGH (ref 70–99)
Potassium: 3.8 mmol/L (ref 3.5–5.1)
Sodium: 136 mmol/L (ref 135–145)

## 2024-01-11 LAB — CBC WITH DIFFERENTIAL/PLATELET
Abs Immature Granulocytes: 0.02 K/uL (ref 0.00–0.07)
Basophils Absolute: 0 K/uL (ref 0.0–0.1)
Basophils Relative: 0 %
Eosinophils Absolute: 0.1 K/uL (ref 0.0–0.5)
Eosinophils Relative: 1 %
HCT: 44.5 % (ref 36.0–46.0)
Hemoglobin: 14.2 g/dL (ref 12.0–15.0)
Immature Granulocytes: 0 %
Lymphocytes Relative: 19 %
Lymphs Abs: 1.5 K/uL (ref 0.7–4.0)
MCH: 28.3 pg (ref 26.0–34.0)
MCHC: 31.9 g/dL (ref 30.0–36.0)
MCV: 88.6 fL (ref 80.0–100.0)
Monocytes Absolute: 0.5 K/uL (ref 0.1–1.0)
Monocytes Relative: 7 %
Neutro Abs: 5.6 K/uL (ref 1.7–7.7)
Neutrophils Relative %: 73 %
Platelets: 207 K/uL (ref 150–400)
RBC: 5.02 MIL/uL (ref 3.87–5.11)
RDW: 14 % (ref 11.5–15.5)
WBC: 7.7 K/uL (ref 4.0–10.5)
nRBC: 0 % (ref 0.0–0.2)

## 2024-01-11 LAB — TYPE AND SCREEN
ABO/RH(D): O POS
Antibody Screen: NEGATIVE

## 2024-01-11 LAB — PROTIME-INR
INR: 0.9 (ref 0.8–1.2)
Prothrombin Time: 12.3 s (ref 11.4–15.2)

## 2024-01-11 SURGERY — CLOSED REDUCTION, HIP
Anesthesia: General | Site: Hip | Laterality: Left

## 2024-01-11 MED ORDER — PROPOFOL 500 MG/50ML IV EMUL
INTRAVENOUS | Status: AC
  Filled 2024-01-11: qty 50

## 2024-01-11 MED ORDER — ONDANSETRON HCL 4 MG/2ML IJ SOLN
4.0000 mg | Freq: Once | INTRAMUSCULAR | Status: AC
  Administered 2024-01-11: 4 mg via INTRAVENOUS
  Filled 2024-01-11: qty 2

## 2024-01-11 MED ORDER — KETAMINE HCL 10 MG/ML IJ SOLN
INTRAMUSCULAR | Status: AC | PRN
  Administered 2024-01-11: 50 mg via INTRAVENOUS

## 2024-01-11 MED ORDER — FENTANYL CITRATE PF 50 MCG/ML IJ SOSY: 25.0000 ug | PREFILLED_SYRINGE | INTRAMUSCULAR | Status: DC | PRN

## 2024-01-11 MED ORDER — HYDROMORPHONE HCL 1 MG/ML IJ SOLN
0.5000 mg | Freq: Once | INTRAMUSCULAR | Status: AC
  Administered 2024-01-11: 0.5 mg via INTRAVENOUS
  Filled 2024-01-11: qty 1

## 2024-01-11 MED ORDER — LIDOCAINE HCL (PF) 2 % IJ SOLN
INTRAMUSCULAR | Status: AC
  Filled 2024-01-11: qty 5

## 2024-01-11 MED ORDER — OXYCODONE-ACETAMINOPHEN 5-325 MG PO TABS: 1.0000 | ORAL_TABLET | Freq: Three times a day (TID) | ORAL | 0 refills | Status: AC | PRN

## 2024-01-11 MED ORDER — PROPOFOL 10 MG/ML IV BOLUS
1.0000 mg/kg | Freq: Once | INTRAVENOUS | Status: AC
  Administered 2024-01-11: 50 mg via INTRAVENOUS
  Filled 2024-01-11: qty 20

## 2024-01-11 MED ORDER — PROPOFOL 10 MG/ML IV BOLUS
INTRAVENOUS | Status: DC | PRN
  Administered 2024-01-11: 100 mg via INTRAVENOUS

## 2024-01-11 MED ORDER — LACTATED RINGERS IV SOLN: INTRAVENOUS | Status: DC

## 2024-01-11 MED ORDER — KETAMINE HCL 50 MG/5ML IJ SOSY
1.0000 mg/kg | PREFILLED_SYRINGE | Freq: Once | INTRAMUSCULAR | Status: AC
  Administered 2024-01-11: 50 mg via INTRAVENOUS
  Filled 2024-01-11: qty 10

## 2024-01-11 MED ORDER — PROPOFOL 10 MG/ML IV BOLUS
INTRAVENOUS | Status: AC | PRN
  Administered 2024-01-11: 10 mg via INTRAVENOUS

## 2024-01-11 MED ORDER — AMISULPRIDE (ANTIEMETIC) 5 MG/2ML IV SOLN: 10.0000 mg | Freq: Once | INTRAVENOUS | Status: DC | PRN

## 2024-01-11 NOTE — Anesthesia Preprocedure Evaluation (Signed)
 Anesthesia Evaluation  Patient identified by MRN, date of birth, ID band Patient awake    Reviewed: Allergy & Precautions, NPO status , Patient's Chart, lab work & pertinent test results  Airway Mallampati: II  TM Distance: >3 FB Neck ROM: Full    Dental  (+) Edentulous Upper, Edentulous Lower, Upper Dentures, Lower Dentures, Dental Advisory Given   Pulmonary COPD, former smoker   Pulmonary exam normal breath sounds clear to auscultation       Cardiovascular + CAD, + Past MI, + Cardiac Stents and + Peripheral Vascular Disease  Normal cardiovascular exam Rhythm:Regular Rate:Normal  TTE 2022 1. Left ventricular ejection fraction, by estimation, is 55 to 60%. The  left ventricle has normal function. The left ventricle has no regional  wall motion abnormalities. There is mild concentric left ventricular  hypertrophy. Left ventricular diastolic  parameters are consistent with Grade I diastolic dysfunction (impaired  relaxation).   2. Right ventricular systolic function is normal. The right ventricular  size is normal. Mildly increased right ventricular wall thickness.   3. Left atrial size was mildly dilated.   4. Right atrial size was mildly dilated.   5. The mitral valve is degenerative. Mild mitral valve regurgitation.   6. The aortic valve is tricuspid. There is moderate calcification of the  aortic valve. There is moderate thickening of the aortic valve. Aortic  valve regurgitation is mild. Mild aortic valve stenosis.   7. There is mild (Grade II) atheroma plaque involving the aortic root and  ascending aorta.   8. The inferior vena cava is normal in size with greater than 50%  respiratory variability, suggesting right atrial pressure of 3 mmHg.     Neuro/Psych  Headaches  Neuromuscular disease  negative psych ROS   GI/Hepatic ,GERD  ,,(+) Hepatitis -, A  Endo/Other  negative endocrine ROS    Renal/GU negative Renal ROS      Musculoskeletal  (+) Arthritis , Rheumatoid disorders,    Abdominal   Peds  Hematology negative hematology ROS (+)   Anesthesia Other Findings On chronic steroids  Reproductive/Obstetrics                              Anesthesia Physical Anesthesia Plan  ASA: 3  Anesthesia Plan: General   Post-op Pain Management: Minimal or no pain anticipated   Induction: Intravenous  PONV Risk Score and Plan: 2 and Ondansetron   Airway Management Planned: Oral ETT and Mask  Additional Equipment: None  Intra-op Plan:   Post-operative Plan: Extubation in OR  Informed Consent: I have reviewed the patients History and Physical, chart, labs and discussed the procedure including the risks, benefits and alternatives for the proposed anesthesia with the patient or authorized representative who has indicated his/her understanding and acceptance.     Dental advisory given  Plan Discussed with: CRNA  Anesthesia Plan Comments:         Anesthesia Quick Evaluation

## 2024-01-11 NOTE — Anesthesia Postprocedure Evaluation (Signed)
 Anesthesia Post Note  Patient: Debra Farmer  Procedure(s) Performed: CLOSED REDUCTION, HIP (Left: Hip)     Patient location during evaluation: PACU Anesthesia Type: General Level of consciousness: awake and alert Pain management: pain level controlled Vital Signs Assessment: post-procedure vital signs reviewed and stable Respiratory status: spontaneous breathing, nonlabored ventilation, respiratory function stable and patient connected to nasal cannula oxygen Cardiovascular status: blood pressure returned to baseline and stable Postop Assessment: no apparent nausea or vomiting Anesthetic complications: no   No notable events documented.  Last Vitals:  Vitals:   01/11/24 2015 01/11/24 2030  BP: (!) 150/52 (!) 138/48  Pulse: (!) 58 64  Resp: 16 14  Temp:    SpO2: 100% 100%    Last Pain:  Vitals:   01/11/24 2030  TempSrc:   PainSc: 0-No pain                 Epifanio Lamar BRAVO

## 2024-01-11 NOTE — Brief Op Note (Signed)
 01/11/2024  7:36 PM  PATIENT:  Debra Farmer  78 y.o. female  PRE-OPERATIVE DIAGNOSIS:  LEFT HIP DISLOCATION  POST-OPERATIVE DIAGNOSIS:  LEFT HIP DISLOCATION  PROCEDURE:  Procedure(s): CLOSED REDUCTION, HIP (Left)  SURGEON:  Surgeons and Role:    DEWAINE Burnetta Aures, MD - Primary  PHYSICIAN ASSISTANT: Jeoffrey Sages, PA  ASSISTANTS:    ANESTHESIA:   IV sedation  EBL:  none   BLOOD ADMINISTERED:none  DRAINS: none   LOCAL MEDICATIONS USED:  NONE  SPECIMEN:  No Specimen  DISPOSITION OF SPECIMEN:  N/A  COUNTS:  YES  TOURNIQUET:  * No tourniquets in log *  DICTATION: .Dragon Dictation  PLAN OF CARE: Discharge to home after PACU  PATIENT DISPOSITION:  PACU - hemodynamically stable.

## 2024-01-11 NOTE — ED Triage Notes (Signed)
 Pt BIB ems for left hip dislocation that happened today after trying to get up. Pt states she has had 4 hip dislocations this year. Pain in left hip and edema in the left leg, Pedal pulses present and equal. Hx of MI, hip replacement, and high cholesterol. 100 fentanyl  given by ems. Denies falling and hitting her head. Takes aspirin  81mg  daily.VS stable

## 2024-01-11 NOTE — Op Note (Signed)
 OPERATIVE REPORT  DATE OF SURGERY: 01/11/2024  PATIENT NAME:  Debra Farmer MRN: [id number] DOB: [birth year]  PCP: Garald Karlynn GAILS, MD  PRE-OPERATIVE DIAGNOSIS: Left periprosthetic dislocation  POST-OPERATIVE DIAGNOSIS: Same  PROCEDURE:   Closed reduction of left hip  SURGEON:  Donaciano Sprang, MD  PHYSICIAN ASSISTANT: Jeoffrey Sages, PA  ANESTHESIA:   Local MAC  BRIEF HISTORY: Debra Farmer is a 78 y.o. female who has had 3 prior hip dislocations.  She presents today with a fourth hip dislocation from a postural indiscretion.  Imaging confirmed the left hip dislocation.  Attempts at closed reduction had failed in the emergency room so we elected to take her to the operating room for formal closed reduction with general anesthesia.  Risks, benefits, alternatives were explained to the patient and her husband and consent was obtained  PROCEDURE DETAILS: Patient was brought to the operating room.  Timeout procedure was performed confirming all pertinent positives.  Anesthesia was then administered and the patient was noted to be sedated.  A gentle closed reduction maneuver was performed and there was an audible clunk confirming the reduction.  Imaging was then performed confirming satisfactory close reduction of the left hip.  A knee immobilizer was applied and the patient was brought to the PACU without incident.   Donaciano Sprang, MD 01/11/2024 7:33 PM

## 2024-01-11 NOTE — H&P (Addendum)
 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  The most recent dislocation was a week ago.  She returns today after bending forward to pick up a prescription bottle and feeling a pop in her hip.  Imaging confirmed a hip dislocation on the left side.  No fracture was seen.  As result orthopedic consultation was requested.  Patient denies loss of consciousness, blurry vision, dizziness or headaches.  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 MUST HAVE COATED ASA   Codeine Sulfate Other (See Comments)    drugged out feeling & hallucinations   Diltiazem      fatigue   Gabapentin      Too sedated   Lipitor [Atorvastatin] Other (See Comments)    Muscle cramps   Methotrexate  Nausea And Vomiting and Swelling   Miralax  [Polyethylene Glycol]     bloating   Nabumetone Other (See Comments)    Unsure of reaction (reaction occurred sometime ago)   Plaquenil [Hydroxychloroquine]     Weak, blurred vision   Pravastatin      Weak legs, arthralgias   Sarilumab       increased cholesterol  Kevsarsa*   Senna Other (See Comments)    Causes bleeding from the anus   Statins Other (See Comments)    Muscle aches and cramps    No current facility-administered medications on file prior to encounter.   Current Outpatient Medications on File Prior to Encounter  Medication Sig Dispense Refill   aspirin  EC 81 MG tablet Take 81 mg by mouth daily. Swallow whole.     Cyanocobalamin  (VITAMIN B-12) 1000 MCG SUBL  Place 1 tablet (1,000 mcg total) under the tongue daily. 100 tablet 3   ENBREL SURECLICK 50 MG/ML injection      furosemide  (LASIX ) 20 MG tablet TAKE 1-2 TABLETS BY MOUTH DAILY AS NEEDED FOR EDEMA 120 tablet 1   hydroxychloroquine (PLAQUENIL) 200 MG tablet Take 200 mg by mouth 2 (two) times daily.     leflunomide  (ARAVA ) 20 MG tablet Take 20 mg by mouth daily.     linaclotide  (LINZESS ) 290 MCG CAPS capsule Take 1 capsule (290 mcg total) by mouth daily. (Patient taking differently: Take 290 mcg by mouth daily as needed (constipation).) 30 capsule 11   Multiple Vitamins-Minerals (IMMUNE SUPPORT PO) Take 1 tablet by mouth daily.     nitroGLYCERIN  (NITROSTAT ) 0.4 MG SL tablet Place 1 tablet (0.4 mg total) under the tongue every 5 (five) minutes x 3 doses as needed for chest pain. 25 tablet 1   oxyCODONE -acetaminophen  (PERCOCET/ROXICET) 5-325 MG tablet Take 1 tablet by mouth every 6 (six) hours as needed for severe pain (pain score 7-10). 15 tablet 0   phentermine  37.5 MG capsule Take 1 capsule (37.5 mg total) by mouth every morning. 30 capsule 2   potassium chloride  (KLOR-CON  M) 10 MEQ tablet Take 1 tablet (10 mEq total) by mouth daily. (Patient taking differently: Take 10 mEq by mouth daily  as needed (when taking furosemide ).) 90 tablet 3   predniSONE  (DELTASONE ) 1 MG tablet Take 4 mg by mouth daily with breakfast.     traMADol  (ULTRAM ) 50 MG tablet Take 1 tablet (50 mg total) by mouth every 6 (six) hours. 120 tablet 3   Vitamin D , Ergocalciferol , (DRISDOL ) 1.25 MG (50000 UNIT) CAPS capsule Take 1 capsule (50,000 Units total) by mouth every 14 (fourteen) days. 6 capsule 3   zolpidem  (AMBIEN ) 10 MG tablet TAKE 1 TABLET BY MOUTH AT BEDTIME FOR SLEEP 90 tablet 1    Physical Exam: Vitals:   01/11/24 1817 01/11/24 1832  BP: (!) 174/74   Pulse: 99   Resp: (!) 26   Temp: 98.6 F (37 C)   SpO2: 99% 100%   Body mass index is 28.12 kg/m. Patient is alert and oriented x 3 but is lethargic due to  recent conscious sedation No shortness of breath or chest pain Abdomen soft and nontender.  No rebound tenderness, no incontinence of bowel or bladder 2+ dorsalis pedis/posterior tibialis pulses bilaterally.  Compartments are soft and nontender. Obvious shortening and rotation of the left lower extremity.  No knee or ankle pain with isolated joint range of motion. No pathology on the right side noted.  Image: DG Hip Unilat W or Wo Pelvis 2-3 Views Left Result Date: 01/11/2024 CLINICAL DATA:  Left hip dislocation EXAM: DG HIP (WITH OR WITHOUT PELVIS) 3V LEFT COMPARISON:  Left hip radiograph dated 01/02/2024 FINDINGS: Femoral component of the left hip arthroplasty is again superiorly dislocated relative to the acetabular component. No acute fracture. Right hip arthroplasty appears intact. L4-5 spinal fusion hardware appears intact. 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 Electronically Signed   By: Limin  Xu M.D.   On: 01/11/2024 16:09   DG Hip Port Unilat W or Wo Pelvis 1 View Left Result Date: 01/02/2024 CLINICAL DATA:  Left hip dislocation, post reduction imaging EXAM: DG HIP (WITH OR WITHOUT PELVIS) 1V PORT LEFT COMPARISON:  None Available. FINDINGS: Single view radiograph of the left hip demonstrates interval relocation of the femoral head component into the acetabular component of left total hip arthroplasty. No superimposed fracture identified. IMPRESSION: 1. Interval relocation of left total hip arthroplasty. Electronically Signed   By: Dorethia Molt M.D.   On: 01/02/2024 13:43   DG Hip Unilat W or Wo Pelvis 2-3 Views Left Result Date: 01/02/2024 CLINICAL DATA:  Left hip prosthesis dislocation, occurring at church today. EXAM: DG HIP (WITH OR WITHOUT PELVIS) 2-3V LEFT COMPARISON:  05/05/2023. FINDINGS: Femoral component of the total left hip arthroplasty has dislocated superiorly to the acetabular component. No fracture. Acetabular and femoral components appear well  seated. Right total hip arthroplasty appears well seated and aligned. IMPRESSION: 1. Dislocated left total hip arthroplasty.  No fracture. Electronically Signed   By: Alm Parkins M.D.   On: 01/02/2024 12:48    A/P: Debra Farmer is a very pleasant 78 year old woman who has had multiple left hip dislocations.  Last dislocation was on 01/02/2024.  She returns to the ER today after a postural indiscretion led to her recurrent left hip dislocation.  Attempts to reduce this in the ER under conscious sedation were unsuccessful and so I have elected to take her to the operating room to perform the closed reduction under deeper anesthesia.  I have discussed the case with the patient and her husband.  Surgical plan is a closed reduction of the hip.  If we are unsuccessful then the hip will remain  dislocated and I will discuss definitive care with Dr. Fidel.  Risks of the procedure are infection, bleeding, inability to reduce the hip, fracture, need for additional surgery.  Since the patient just received conscious sedation I will defer the consent to her husband.  All of their questions were addressed.

## 2024-01-11 NOTE — ED Notes (Signed)
 Pt to Xray.

## 2024-01-11 NOTE — ED Provider Notes (Signed)
 [place] [facility]ERGENC[facility] DEPARTMENT AT [facility] Provider Note   CSN: [id number] Arrival date & time: 01/11/24  1506     Patient presents with: Hip Injury   Debra Farmer is a 78 y.o. female.   HPI Patient presents for left hip dislocation.  Medical history includes CAD, COPD, GERD, arthritis, HLD, PVD.  She was seen in the ED 9 days ago for hip dislocation.  She underwent reduction in the ED.  Knee immobilizer was placed.  He had plans for outpatient follow-up with Dr. Fidel.  Today, she had recurrence of suspected left hip dislocation.  This occurred shortly prior to arrival.  She bent forward to pick something up and as she stood, she felt her hip dislocate again.  This is the fourth occurrence.  She was given 100 mcg of fentanyl  prior to arrival.    Prior to Admission medications   Medication Sig Start Date End Date Taking? Authorizing Provider  oxyCODONE -acetaminophen  (PERCOCET) 5-325 MG tablet Take 1 tablet by mouth every 8 (eight) hours as needed for up to 5 days for severe pain (pain score 7-10). 01/11/24 01/16/24 Yes Cand[place], [employer], PA-C  aspirin  EC 81 MG tablet Take 81 mg by mouth daily. Swallow whole.    [provider]  Cyanocobalamin  (VITAMIN B-12) 1000 MCG SUBL Place 1 tablet (1,000 mcg total) under the tongue daily. 12/02/23   Plotnikov, Aleksei V, MD  ENBREL SURECLICK 50 MG/ML injection  10/02/23   [provider]  furosemide  (LASIX ) 20 MG tablet TAKE 1-2 TABLETS BY MOUTH DAILY AS NEEDED FOR EDEMA 06/23/23   Plotnikov, Aleksei V, MD  hydroxychloroquine (PLAQUENIL) 200 MG tablet Take 200 mg by mouth 2 (two) times daily. 10/01/23   [provider]  leflunomide  (ARAVA ) 20 MG tablet Take 20 mg by mouth daily.    [provider]  linaclotide  (LINZESS ) 290 MCG CAPS capsule Take 1 capsule (290 mcg total) by mouth daily. Patient taking differently: Take 290 mcg by mouth daily as needed (constipation). 11/11/21   Plotnikov, Aleksei V, MD   Multiple Vitamins-Minerals (IMMUNE SUPPORT PO) Take 1 tablet by mouth daily.    [provider]  nitroGLYCERIN  (NITROSTAT ) 0.4 MG SL tablet Place 1 tablet (0.4 mg total) under the tongue every 5 (five) minutes x 3 doses as needed for chest pain. 07/21/20   Claudene Pacific, MD  oxyCODONE -acetaminophen  (PERCOCET/ROXICET) 5-325 MG tablet Take 1 tablet by mouth every 6 (six) hours as needed for severe pain (pain score 7-10). 01/02/24   Dasie Faden, MD  phentermine  37.5 MG capsule Take 1 capsule (37.5 mg total) by mouth every morning. 06/23/23   Plotnikov, Aleksei V, MD  potassium chloride  (KLOR-CON  M) 10 MEQ tablet Take 1 tablet (10 mEq total) by mouth daily. Patient taking differently: Take 10 mEq by mouth daily as needed (when taking furosemide ). 11/30/22   Plotnikov, Aleksei V, MD  predniSONE  (DELTASONE ) 1 MG tablet Take 4 mg by mouth daily with breakfast.    [provider]  traMADol  (ULTRAM ) 50 MG tablet Take 1 tablet (50 mg total) by mouth every 6 (six) hours. 06/23/23   Plotnikov, Aleksei V, MD  Vitamin D , Ergocalciferol , (DRISDOL ) 1.25 MG (50000 UNIT) CAPS capsule Take 1 capsule (50,000 Units total) by mouth every 14 (fourteen) days. 12/02/23   Plotnikov, Karlynn GAILS, MD  zolpidem  (AMBIEN ) 10 MG tablet TAKE 1 TABLET BY MOUTH AT BEDTIME FOR SLEEP 01/03/24   Plotnikov, Aleksei V, MD    Allergies: Aspirin , Codeine sulfate, Diltiazem ,  Gabapentin , Lipitor [atorvastatin], Methotrexate , Miralax  [polyethylene glycol], Nabumetone, Plaquenil [hydroxychloroquine], Pravastatin , Sarilumab , Senna, and Statins    Review of Systems  Musculoskeletal:  Positive for arthralgias.  All other systems reviewed and are negative.   Updated Vital Signs BP (!) 150/52   Pulse (!) 58   Temp 98.2 F (36.8 C)   Resp 16   Ht 5' (1.524 m)   Wt 65.3 kg   SpO2 100%   BMI 28.12 kg/m   Physical Exam Vitals and nursing note reviewed.  Constitutional:      General: She is not in acute distress.     Appearance: Normal appearance. She is well-developed. She is not ill-appearing, toxic-appearing or diaphoretic.  HENT:     Head: Normocephalic and atraumatic.     Right Ear: External ear normal.     Left Ear: External ear normal.     Nose: Nose normal.     Mouth/Throat:     Mouth: Mucous membranes are moist.  Eyes:     Extraocular Movements: Extraocular movements intact.     Conjunctiva/sclera: Conjunctivae normal.  Cardiovascular:     Rate and Rhythm: Normal rate and regular rhythm.  Pulmonary:     Effort: Pulmonary effort is normal. No respiratory distress.  Abdominal:     General: There is no distension.     Palpations: Abdomen is soft.  Musculoskeletal:        General: Deformity present.     Cervical back: Normal range of motion and neck supple.  Skin:    General: Skin is warm and dry.     Coloration: Skin is not jaundiced or pale.  Neurological:     General: No focal deficit present.     Mental Status: She is alert and oriented to person, place, and time.  Psychiatric:        Mood and Affect: Mood normal.        Behavior: Behavior normal.     (all labs ordered are listed, but only abnormal results are displayed) Labs Reviewed  BASIC METABOLIC PANEL WITH GFR - Abnormal; Notable for the following components:      Result Value   Glucose, Bld 124 (*)    All other components within normal limits  CBC WITH DIFFERENTIAL/PLATELET  PROTIME-INR  TYPE AND SCREEN    EKG: None  Radiology: DG HIP UNILAT WITH PELVIS 2-3 VIEWS LEFT Result Date: 01/11/2024 CLINICAL DATA:  Post reduction EXAM: DG HIP (WITH OR WITHOUT PELVIS) 2-3V LEFT COMPARISON:  The left hip x-ray 01/11/2024 FINDINGS: Left hip arthroplasty is now in anatomic alignment. There is no evidence for fracture or hardware loosening. Right hip arthroplasty is also in anatomic alignment. Lower lumbar fusion hardware is partially visualized. Peripheral vascular calcifications are present. IMPRESSION: Left hip arthroplasty  is now in anatomic alignment. Electronically Signed   By: Greig Pique M.D.   On: 01/11/2024 19:46   DG Hip Port Unilat W or Wo Pelvis 1 View Left Result Date: 01/11/2024 CLINICAL DATA:  Dislocation EXAM: DG HIP (WITH OR WITHOUT PELVIS) 1V PORT LEFT COMPARISON:  Left hip x-ray 01/11/2024 FINDINGS: Left hip total arthroplasty is again seen. There is superior dislocation of the femoral head component in relation to the acetabular cup, unchanged from prior. No acute fracture or hardware loosening identified. Lumbar spinal fusion hardware is partially imaged. IMPRESSION: Unchanged dislocation of left hip arthroplasty. Electronically Signed   By: Greig Pique M.D.   On: 01/11/2024 18:53   DG Tibia/Fibula Left Result Date: 01/11/2024 CLINICAL  DATA:  Fall EXAM: LEFT TIBIA AND FIBULA - 2 VIEW COMPARISON:  None Available. FINDINGS: There is no evidence of fracture or other focal bone lesions. Peripheral vascular calcifications are present. Soft tissues are otherwise within normal limits. IMPRESSION: 1. No acute fracture or dislocation. 2. Peripheral vascular calcifications. Electronically Signed   By: Greig Pique M.D.   On: 01/11/2024 18:52   DG Hip Unilat W or Wo Pelvis 2-3 Views Left Result Date: 01/11/2024 CLINICAL DATA:  Left hip dislocation EXAM: DG HIP (WITH OR WITHOUT PELVIS) 3V LEFT COMPARISON:  Left hip radiograph dated 01/02/2024 FINDINGS: Femoral component of the left hip arthroplasty is again superiorly dislocated relative to the acetabular component. No acute fracture. Right hip arthroplasty appears intact. L4-5 spinal fusion hardware appears intact. 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 Electronically Signed   By: Limin  Xu M.D.   On: 01/11/2024 16:09     .Sedation  Date/Time: 01/11/2024 6:00 PM  Performed by: Melvenia Motto, MD Authorized by: Melvenia Motto, MD   Consent:    Consent obtained:  Verbal   Consent given by:  Patient   Risks discussed:  Prolonged hypoxia  resulting in organ damage, allergic reaction, inadequate sedation, respiratory compromise necessitating ventilatory assistance and intubation, vomiting and nausea Universal protocol:    Immediately prior to procedure, a time out was called: yes     Patient identity confirmed:  Verbally with patient Indications:    Procedure performed:  Dislocation reduction   Procedure necessitating sedation performed by:  Physician performing sedation Pre-sedation assessment:    Time since last food or drink:  10 hours   ASA classification: class 3 - patient with severe systemic disease     Mouth opening:  2 finger widths   Thyromental distance:  3 finger widths   Mallampati score:  II - soft palate, uvula, fauces visible   Neck mobility: normal     Pre-sedation assessments completed and reviewed: airway patency, cardiovascular function, hydration status, mental status, nausea/vomiting, pain level and respiratory function   A pre-sedation assessment was completed prior to the start of the procedure Immediate pre-procedure details:    Reassessment: Patient reassessed immediately prior to procedure     Reviewed: vital signs, relevant labs/tests and NPO status     Verified: bag valve mask available, emergency equipment available, intubation equipment available, IV patency confirmed, oxygen available and suction available   Procedure details (see MAR for exact dosages):    Preoxygenation:  Nasal cannula   Sedation:  Propofol  and ketamine    Intended level of sedation: deep   Analgesia:  Hydromorphone    Intra-procedure monitoring:  Blood pressure monitoring, cardiac monitor, continuous capnometry, continuous pulse oximetry, frequent LOC assessments and frequent vital sign checks   Intra-procedure events: none     Total Provider sedation time (minutes):  15 Post-procedure details:   A post-sedation assessment was completed following the completion of the procedure.   Attendance: Constant attendance by  certified staff until patient recovered     Recovery: Patient returned to pre-procedure baseline     Post-sedation assessments completed and reviewed: airway patency, cardiovascular function, hydration status, mental status, nausea/vomiting, pain level and respiratory function     Patient is stable for discharge or admission: yes     Procedure completion:  Tolerated well, no immediate complications .Ortho Injury Treatment  Date/Time: 01/11/2024 6:00 PM  Performed by: Melvenia Motto, MD Authorized by: Melvenia Motto, MD   Consent:    Consent obtained:  Verbal   Consent  given by:  Patient   Risks discussed:  Irreducible dislocation, recurrent dislocation, stiffness, vascular damage, fracture and nerve damage   Alternatives discussed:  Delayed treatmentInjury location: hip Location details: left hip Injury type: dislocation Spontaneous dislocation: yes Prosthesis: yes Pre-procedure neurovascular assessment: neurovascularly intact Pre-procedure distal perfusion: normal Pre-procedure range of motion: reduced  Anesthesia: Local anesthesia used: no  Patient sedated: Yes. Refer to sedation procedure documentation for details of sedation. Manipulation performed: yes Reduction method: traction and counter traction and Allis maneuver Reduction successful: no Post-procedure neurovascular assessment: post-procedure neurovascularly intact Post-procedure distal perfusion: normal Post-procedure range of motion: unchanged      Medications Ordered in the ED  lactated ringers  infusion ( Intravenous Anesthesia Volume Adjustment 01/11/24 1941)  fentaNYL  (SUBLIMAZE ) injection 25-50 mcg (has no administration in time range)  amisulpride  (BARHEMSYS ) injection 10 mg (has no administration in time range)  HYDROmorphone  (DILAUDID ) injection 0.5 mg (0.5 mg Intravenous Given 01/11/24 1545)  ketamine  50 mg in normal saline 5 mL (10 mg/mL) syringe (50 mg Intravenous Given 01/11/24 1821)  propofol  (DIPRIVAN ) 10  mg/mL bolus/IV push 64.4 mg (50 mg Intravenous Given 01/11/24 1815)  ondansetron  (ZOFRAN ) injection 4 mg (4 mg Intravenous Given 01/11/24 1546)  ketamine  (KETALAR ) injection (50 mg Intravenous Given 01/11/24 1805)  propofol  (DIPRIVAN ) 10 mg/mL bolus/IV push (10 mg Intravenous Given 01/11/24 1809)                                    Medical Decision Making Amount and/or Complexity of Data Reviewed Labs: ordered. Radiology: ordered.  Risk Prescription drug management.   This patient presents to the ED for concern of left hip pain, this involves an extensive number of treatment options, and is a complaint that carries with it a high risk of complications and morbidity.  The differential diagnosis includes dislocation, fracture   Co morbidities / Chronic conditions that complicate the patient evaluation  CAD, COPD, GERD, arthritis, HLD, PVD   Additional history obtained:  Additional history obtained from EMR External records from outside source obtained and reviewed including N/A   Lab Tests:  I Ordered, and personally interpreted labs.  The pertinent results include: Normal hemoglobin, no leukocytosis, normal electrolytes, normal kidney function   Imaging Studies ordered:  I ordered imaging studies including left hip x-ray, left tib-fib x-ray I independently visualized and interpreted imaging which showed left hip arthroplasty dislocation I agree with the radiologist interpretation   Cardiac Monitoring: / EKG:  The patient was maintained on a cardiac monitor.  I personally viewed and interpreted the cardiac monitored which showed an underlying rhythm of: Sinus rhythm   Problem List / ED Course / Critical interventions / Medication management  Patient presenting for recurrence of left hip dislocation.  This occurred 9 days ago and reoccurred today.  On arrival in the ED, she is alert and oriented.  She has some shortening and internal rotation of her left leg.  She has  significant ongoing pain despite 100 mcg of fentanyl  given prior to arrival.  Dilaudid  was ordered for analgesia.  X-ray imaging confirms dislocation.  Given the recurrence of this, I spoke with orthopedic surgeon, Dr. Burnetta.  Dr. Burnetta requests attempt at reduction in the ED.  If unsuccessful, patient to be taken to the OR.  Patient underwent sedation with ketamine  and propofol .  Reduction was unsuccessful.  While attempting reduction with To Joesph hubert, there was a pop felt in her  lower leg.  No deformity was noted.  X-ray imaging was ordered to assess for possible iatrogenic injury.  X-ray imaging did not show any acute findings.  Dr. Burnetta arrived and patient was taken to the OR for operative management of her hip dislocation. I ordered medication including Dilaudid  for analgesia, ketamine  and propofol  for procedural sedation, IV fluids for hydration Reevaluation of the patient after these medicines showed that the patient improved I have reviewed the patients home medicines and have made adjustments as needed   Consultations Obtained:  I requested consultation with the orthopedic surgeon, Dr. Burnetta,  and discussed lab and imaging findings as well as pertinent plan - they recommend: Operative management   Social Determinants of Health:  Has PCP     Final diagnoses:  Dislocation of left hip, initial encounter [facility])    ED Discharge Orders          Ordered    oxyCODONE -acetaminophen  (PERCOCET) 5-325 MG tablet  Every 8 hours PRN        01/11/24 1947               Melvenia Motto, MD 01/11/24 2029

## 2024-01-11 NOTE — Transfer of Care (Signed)
 Immediate Anesthesia Transfer of Care Note  Patient: Debra Farmer  Procedure(s) Performed: CLOSED REDUCTION, HIP (Left: Hip)  Patient Location: PACU  Anesthesia Type:General  Level of Consciousness: awake, alert , and oriented  Airway & Oxygen Therapy: Patient Spontanous Breathing and Patient connected to nasal cannula oxygen  Post-op Assessment: Report given to RN and Post -op Vital signs reviewed and stable  Post vital signs: Reviewed and stable  Last Vitals:  Vitals Value Taken Time  BP    Temp    Pulse 66 01/11/24 19:42  Resp 20 01/11/24 19:42  SpO2 98 % 01/11/24 19:42  Vitals shown include unfiled device data.  Last Pain:  Vitals:   01/11/24 1904  TempSrc: Oral  PainSc: 0-No pain      Patients Stated Pain Goal: 3 (01/11/24 1904)  Complications: No notable events documented.

## 2024-01-11 NOTE — Progress Notes (Signed)
 Respiratory Therapist at [facility]  in room number _WA07___ during procedure.  Suction with [place] at [facility] set up and ready to use.yes Ambu bag at [facility] and ready to use.yes  Patient placed on ETCO2 Nasal Cannula at _2___ LPM.  Vitals at conclusion of procedure:  ETCO2 __11__ mmHg HR 79 RR 24 SPO2 100  Patient awake and able to verbalize name. yes

## 2024-01-11 NOTE — Progress Notes (Signed)
 Orthopedic Tech Progress Note Patient Details:  Debra Farmer May 14, 1946 [id number]  Patient ID: Debra Farmer, female   DOB: [birth year], 78 y.o.   MRN: [id number]  Debra Farmer 01/11/2024, 6:43 PM Knee immobilizer sent with pt to OR. To be applied by ortho dr.

## 2024-01-12 ENCOUNTER — Encounter (HOSPITAL_COMMUNITY): Payer: Self-pay | Admitting: Orthopedic Surgery

## 2024-01-14 DIAGNOSIS — Z96641 Presence of right artificial hip joint: Secondary | ICD-10-CM | POA: Diagnosis not present

## 2024-01-14 DIAGNOSIS — S93402A Sprain of unspecified ligament of left ankle, initial encounter: Secondary | ICD-10-CM | POA: Diagnosis not present

## 2024-01-18 ENCOUNTER — Telehealth: Payer: Self-pay

## 2024-01-18 DIAGNOSIS — I2510 Atherosclerotic heart disease of native coronary artery without angina pectoris: Secondary | ICD-10-CM

## 2024-01-18 NOTE — Telephone Encounter (Signed)
 Copied from CRM #[id number]. Topic: General - Other >> Jan 18, 2024  3:49 PM Henretta I wrote: Reason for CRM: Patient would like to know a heart doctor  that Dr.Plotnikov recommends, In her network.

## 2024-01-20 NOTE — Telephone Encounter (Signed)
 See Dr Jeffrie.  Does she need a referral?  Thanks

## 2024-01-21 NOTE — Telephone Encounter (Signed)
 Done. Thanks.

## 2024-02-02 DIAGNOSIS — J449 Chronic obstructive pulmonary disease, unspecified: Secondary | ICD-10-CM | POA: Diagnosis not present

## 2024-02-02 DIAGNOSIS — I2510 Atherosclerotic heart disease of native coronary artery without angina pectoris: Secondary | ICD-10-CM | POA: Diagnosis not present

## 2024-02-02 DIAGNOSIS — Z9861 Coronary angioplasty status: Secondary | ICD-10-CM | POA: Diagnosis not present

## 2024-02-02 DIAGNOSIS — I7389 Other specified peripheral vascular diseases: Secondary | ICD-10-CM | POA: Diagnosis not present

## 2024-02-08 DIAGNOSIS — Z9861 Coronary angioplasty status: Secondary | ICD-10-CM | POA: Diagnosis not present

## 2024-02-08 DIAGNOSIS — I2510 Atherosclerotic heart disease of native coronary artery without angina pectoris: Secondary | ICD-10-CM | POA: Diagnosis not present

## 2024-02-08 DIAGNOSIS — I425 Other restrictive cardiomyopathy: Secondary | ICD-10-CM | POA: Diagnosis not present

## 2024-02-10 NOTE — Progress Notes (Signed)
 Sent message, via epic in basket, requesting orders in epic from [facility] adviser.

## 2024-02-11 NOTE — Patient Instructions (Addendum)
 SURGICAL WAITING ROOM VISITATION Patients having surgery or a procedure may have no more than 2 support people in the waiting area - these visitors may rotate in the visitor waiting room.   Due to an increase in RSV and influenza rates and associated hospitalizations, children ages 28 and under may not visit patients in [facility] hospitals. If the patient needs to stay at the hospital during part of their recovery, the visitor guidelines for inpatient rooms apply.  PRE-OP VISITATION  Pre-op nurse will coordinate an appropriate time for 1 support person to accompany the patient in pre-op.  This support person may not rotate.  This visitor will be contacted when the time is appropriate for the visitor to come back in the pre-op area.  Please refer to the [facility] website for the visitor guidelines for Inpatients (after your surgery is over and you are in a regular room).  You are not required to quarantine at this time prior to your surgery. However, you must do this: Hand Hygiene often Do NOT share personal items Notify your provider if you are in close contact with someone who has COVID or you develop fever 100.4 or greater, new onset of sneezing, cough, sore throat, shortness of breath or body aches.  If you test positive for Covid or have been in contact with anyone that has tested positive in the last 10 days please notify you surgeon.    Your procedure is scheduled on:  02/24/24  Report to [facility] Main Entrance: [place] entrance where the [employer] is available.   Report to admitting at: 9:30 AM  Call this number if you have any questions or problems the morning of surgery [phone]  FOLLOW ANY ADDITIONAL PRE OP INSTRUCTIONS YOU RECEIVED FROM YOUR SURGEON'S OFFICE!!!  Do not eat food after Midnight the night prior to your surgery/procedure.  After Midnight you may have the following liquids until : 9:00 AM DAY OF SURGERY  Clear Liquid Diet Water  Black  Coffee (sugar ok, NO MILK/CREAM OR CREAMERS)  Tea (sugar ok, NO MILK/CREAM OR CREAMERS) regular and decaf                             Plain Jell-O  with no fruit (NO RED)                                           Fruit ices (not with fruit pulp, NO RED)                                     Popsicles (NO RED)                                                                  Juice: NO CITRUS JUICES: only apple, WHITE grape, WHITE cranberry Sports drinks like Gatorade or Powerade (NO RED)    Oral Hygiene is also important to reduce your risk of infection.        Remember - BRUSH YOUR TEETH THE MORNING OF SURGERY WITH YOUR  REGULAR TOOTHPASTE  Do NOT smoke after Midnight the night before surgery.  STOP TAKING all Vitamins, Herbs and supplements 1 week before your surgery.   Take ONLY these medicines the morning of surgery with A SIP OF WATER : prednisone .Tylenol  as needed.  If You have been diagnosed with Sleep Apnea - Bring CPAP mask and tubing day of surgery. We will provide you with a CPAP machine on the day of your surgery.                   You may not have any metal on your body including hair pins, jewelry, and body piercing  Do not wear make-up, lotions, powders, perfumes / cologne, or deodorant  Do not wear nail polish including gel and S&S, artificial / acrylic nails, or any other type of covering on natural nails including finger and toenails. If you have artificial nails, gel coating, etc., that needs to be removed by a nail salon, Please have this removed prior to surgery. Not doing so may mean that your surgery could be cancelled or delayed if the Surgeon or anesthesia staff feels like they are unable to monitor you safely.   Do not shave 48 hours prior to surgery to avoid nicks in your skin which may contribute to postoperative infections.   Contacts, Hearing Aids, dentures or bridgework may not be worn into surgery. DENTURES WILL BE REMOVED PRIOR TO SURGERY PLEASE DO NOT APPLY  Poly grip OR ADHESIVES!!!  You may bring a small overnight bag with you on the day of surgery, only pack items that are not valuable. [facility] IS NOT RESPONSIBLE   FOR VALUABLES THAT ARE LOST OR STOLEN.   Patients discharged on the day of surgery will not be allowed to drive home.  Someone NEEDS to stay with you for the first 24 hours after anesthesia.  Do not bring your home medications to the hospital. The Pharmacy will dispense medications listed on your medication list to you during your admission in the Hospital.  Special Instructions: Bring a copy of your healthcare power of attorney and living will documents the day of surgery, if you wish to have them scanned into your [facility] Records- EPIC  Please read over the following fact sheets you were given: IF YOU HAVE QUESTIONS ABOUT YOUR PRE-OP INSTRUCTIONS, PLEASE CALL [phone]   PATIENT SIGNATURE_________________________________  NURSE SIGNATURE__________________________________  ________________________________________________________________________    Pre-operative 5 CHG Bath Instructions   You can play a key role in reducing the risk of infection after surgery. Your skin needs to be as free of germs as possible. You can reduce the number of germs on your skin by washing with CHG (chlorhexidine  gluconate) soap before surgery. CHG is an antiseptic soap that kills germs and continues to kill germs even after washing.   DO NOT use if you have an allergy to chlorhexidine /CHG or antibacterial soaps. If your skin becomes reddened or irritated, stop using the CHG and notify one of our RNs at [phone].   Please shower with the CHG soap starting 4 days before surgery using the following schedule:     Please keep in mind the following:  DO NOT shave, including legs and underarms, starting the day of your first shower.   You may shave your face at any point before/day of surgery.  Place clean sheets on your bed  the day you start using CHG soap. Use a clean washcloth (not used since being washed) for each shower. DO NOT sleep with  pets once you start using the CHG.   CHG Shower Instructions:  If you choose to wash your hair and private area, wash first with your normal shampoo/soap.  After you use shampoo/soap, rinse your hair and body thoroughly to remove shampoo/soap residue.  Turn the water  OFF and apply about 3 tablespoons (45 ml) of CHG soap to a CLEAN washcloth.  Apply CHG soap ONLY FROM YOUR NECK DOWN TO YOUR TOES (washing for 3-5 minutes)  DO NOT use CHG soap on face, private areas, open wounds, or sores.  Pay special attention to the area where your surgery is being performed.  If you are having back surgery, having someone wash your back for you may be helpful. Wait 2 minutes after CHG soap is applied, then you may rinse off the CHG soap.  Pat dry with a clean towel  Put on clean clothes/pajamas   If you choose to wear lotion, please use ONLY the CHG-compatible lotions on the back of this paper.     Additional instructions for the day of surgery: DO NOT APPLY any lotions, deodorants, cologne, or perfumes.   Put on clean/comfortable clothes.  Brush your teeth.  Ask your nurse before applying any prescription medications to the skin.   CHG Compatible Lotions   Aveeno Moisturizing lotion  Cetaphil Moisturizing Cream  Cetaphil Moisturizing Lotion  Clairol Herbal Essence Moisturizing Lotion, Dry Skin  Clairol Herbal Essence Moisturizing Lotion, Extra Dry Skin  Clairol Herbal Essence Moisturizing Lotion, Normal Skin  Curel Age Defying Therapeutic Moisturizing Lotion with Alpha Hydroxy  Curel Extreme Care Body Lotion  Curel Soothing Hands Moisturizing Hand Lotion  Curel Therapeutic Moisturizing Cream, Fragrance-Free  Curel Therapeutic Moisturizing Lotion, Fragrance-Free  Curel Therapeutic Moisturizing Lotion, Original Formula  Eucerin Daily Replenishing Lotion  Eucerin Dry Skin  Therapy Plus Alpha Hydroxy Crme  Eucerin Dry Skin Therapy Plus Alpha Hydroxy Lotion  Eucerin Original Crme  Eucerin Original Lotion  Eucerin Plus Crme Eucerin Plus Lotion  Eucerin TriLipid Replenishing Lotion  Keri Anti-Bacterial Hand Lotion  Keri Deep Conditioning Original Lotion Dry Skin Formula Softly Scented  Keri Deep Conditioning Original Lotion, Fragrance Free Sensitive Skin Formula  Keri Lotion Fast Absorbing Fragrance Free Sensitive Skin Formula  Keri Lotion Fast Absorbing Softly Scented Dry Skin Formula  Keri Original Lotion  Keri Skin Renewal Lotion Keri Silky Smooth Lotion  Keri Silky Smooth Sensitive Skin Lotion  Nivea Body Creamy Conditioning Oil  Nivea Body Extra Enriched Teacher, adult education Moisturizing Lotion Nivea Crme  Nivea Skin Firming Lotion  NutraDerm 30 Skin Lotion  NutraDerm Skin Lotion  NutraDerm Therapeutic Skin Cream  NutraDerm Therapeutic Skin Lotion  ProShield Protective Hand Cream  Provon moisturizing lotion    ________________________________________________________________________

## 2024-02-15 ENCOUNTER — Encounter (HOSPITAL_COMMUNITY)
Admission: RE | Admit: 2024-02-15 | Discharge: 2024-02-15 | Disposition: A | Discharge status: Home / Self Care | Source: Ambulatory Visit | Attending: Orthopedic Surgery | Admitting: Orthopedic Surgery

## 2024-02-15 ENCOUNTER — Other Ambulatory Visit: Payer: Self-pay

## 2024-02-15 ENCOUNTER — Ambulatory Visit: Payer: Self-pay | Admitting: Student

## 2024-02-15 ENCOUNTER — Encounter (HOSPITAL_COMMUNITY): Payer: Self-pay

## 2024-02-15 VITALS — BP 140/74 | HR 83 | Temp 98.4°F | Ht 60.0 in | Wt 137.0 lb

## 2024-02-15 DIAGNOSIS — I2583 Coronary atherosclerosis due to lipid rich plaque: Secondary | ICD-10-CM | POA: Insufficient documentation

## 2024-02-15 DIAGNOSIS — I2510 Atherosclerotic heart disease of native coronary artery without angina pectoris: Secondary | ICD-10-CM | POA: Diagnosis not present

## 2024-02-15 DIAGNOSIS — M7061 Trochanteric bursitis, right hip: Secondary | ICD-10-CM | POA: Diagnosis not present

## 2024-02-15 DIAGNOSIS — Z01812 Encounter for preprocedural laboratory examination: Secondary | ICD-10-CM | POA: Insufficient documentation

## 2024-02-15 DIAGNOSIS — Z01818 Encounter for other preprocedural examination: Secondary | ICD-10-CM

## 2024-02-15 HISTORY — DX: Atherosclerotic heart disease of native coronary artery without angina pectoris: I25.10

## 2024-02-15 LAB — BASIC METABOLIC PANEL WITH GFR
Anion gap: 12 (ref 5–15)
BUN: 11 mg/dL (ref 8–23)
CO2: 25 mmol/L (ref 22–32)
Calcium: 9.4 mg/dL (ref 8.9–10.3)
Chloride: 101 mmol/L (ref 98–111)
Creatinine, Ser: 0.59 mg/dL (ref 0.44–1.00)
GFR, Estimated: >60 mL/min (ref >60)
Glucose, Bld: 111 mg/dL — ABNORMAL HIGH (ref 70–99)
Potassium: 4.4 mmol/L (ref 3.5–5.1)
Sodium: 137 mmol/L (ref 135–145)

## 2024-02-15 LAB — CBC
HCT: 44.5 % (ref 36.0–46.0)
Hemoglobin: 14.1 g/dL (ref 12.0–15.0)
MCH: 28.2 pg (ref 26.0–34.0)
MCHC: 31.7 g/dL (ref 30.0–36.0)
MCV: 89 fL (ref 80.0–100.0)
Platelets: 238 K/uL (ref 150–400)
RBC: 5 MIL/uL (ref 3.87–5.11)
RDW: 14.1 % (ref 11.5–15.5)
WBC: 8.9 K/uL (ref 4.0–10.5)
nRBC: 0 % (ref 0.0–0.2)

## 2024-02-15 LAB — SURGICAL PCR SCREEN
MRSA, PCR: NEGATIVE
Staphylococcus aureus: POSITIVE — AB

## 2024-02-15 NOTE — H&P (View-Only) (Signed)
 TOTAL HIP REVISION ADMISSION H&P  Patient is admitted for left revision total hip arthroplasty.  Subjective:  Chief Complaint: left hip pain  HPI: Debra Farmer, 78 y.o. female, has a history of pain and functional disability in the left hip due to recurrent instability and patient has failed non-surgical conservative treatments for greater than 12 weeks to include flexibility and strengthening excercises, use of assistive devices, and activity modification. The indications for the revision total hip arthroplasty are recurrent instability.  Onset of symptoms was abrupt starting 1 years ago with rapidlly worsening course since that time.  Prior procedures on the left hip include arthroplasty and closed reductions under anesthesia.  Patient currently rates pain in the left hip at 10 out of 10 with activity.  There is night pain, worsening of pain with activity and weight bearing, trendelenberg gait, pain that interfers with activities of daily living, and pain with passive range of motion. Patient has evidence of left total hip arthroplasty by imaging studies.  This condition presents safety issues increasing the risk of falls.  This patient has had failed left total hip arthroplasty.  There is no current active infection.  Patient Active Problem List   Diagnosis Date Noted   Vitamin B12 deficiency 11/29/2023   Neoplasm of uncertain behavior of skin 11/29/2023   Obesity (BMI 30-39.9) 06/23/2023   Osteoarthritis of left hip 01/07/2023   Hepatitis A antibody positive 10/05/2022   Trochanteric bursitis of left hip 10/05/2022   S/P lumbar fusion 05/27/2022   Baker's cyst, ruptured 11/11/2021   Edema 11/11/2021   Generalized osteoarthritis 11/11/2021   Other long term (current) drug therapy 11/11/2021   Other specified abnormal immunological findings in serum 11/11/2021   Polyarthralgia 11/11/2021   Sciatica 11/11/2021   Constipation 11/11/2021   Degenerative joint disease involving multiple  joints 11/11/2021   Hypercholesterolemia 06/23/2021   Neuropathy, peripheral 11/04/2020   Bilateral carpal tunnel syndrome 08/21/2020   Statin myopathy 08/11/2020   Pain in right hand 08/07/2020   Acute coronary syndrome (HCC) 07/19/2020   Coronary artery disease    COVID-19 05/16/2020   Cervical myelopathy (HCC) 04/03/2020   Grief at loss of child 03/20/2020   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   Preop exam for internal medicine 03/20/2020   Bilateral hand numbness 01/24/2020   Paraplegia (HCC) 11/21/2019   Pain in left foot 12/30/2018   Osteopenia 06/21/2018   Peripheral vascular disease (HCC) 06/16/2018   Spondylolisthesis, grade 1 06/16/2018   Degeneration of lumbar intervertebral disc 02/03/2018   Scoliosis of lumbar spine 02/03/2018   History of total hip arthroplasty 12/03/2017   Coronary atherosclerosis 09/22/2017   Chest pain, atypical 09/22/2017   Primary osteoarthritis of right hip 04/01/2017   Osteoarthritis of right hip 04/01/2017   Osteoporosis 03/03/2017   Decreased libido 10/19/2016   Spinal stenosis at L4-L5 level 08/26/2016   Hip osteoarthritis 04/28/2016   Neuropathy of right peroneal nerve 02/10/2016   Diarrhea 09/23/2015   Bell's palsy 03/02/2015   Seronegative rheumatoid arthritis (HCC) 03/02/2015   Neck pain 04/26/2014   Herpes zoster 04/05/2014   Thrush, oral 03/06/2014   Left arm swelling 01/15/2013   Cough 09/19/2012   Insomnia 09/01/2012   CTS (carpal tunnel syndrome) 04/15/2012   Arthritis of multiple sites 03/04/2012   Well adult exam 01/27/2012   Knee pain, bilateral 01/08/2012   Left hip pain 12/26/2010   DISCITIS 07/25/2010   SWEATING 05/23/2010   PARESTHESIA, HANDS 04/25/2010   COPD mixed type (HCC) 07/26/2009  SINUSITIS, ACUTE 08/01/2008   RASH AND OTHER NONSPECIFIC SKIN ERUPTION 08/01/2008   TOBACCO USE DISORDER/SMOKER-SMOKING CESSATION DISCUSSED 03/08/2008   BRONCHITIS, ACUTE 03/08/2008   Dyslipidemia 02/27/2008   CRAMP OF  LIMB 02/27/2008   FATIGUE 02/27/2008   Wheezing 02/27/2008   Meningitis 08/11/2007   GERD 08/11/2007   Headache 08/11/2007   Past Medical History:  Diagnosis Date   Arthritis    RA   Cancer (HCC) 1972   uterine cancer-hysterectomy   COPD (chronic obstructive pulmonary disease) (HCC)    Coronary artery disease    GERD (gastroesophageal reflux disease)    Myocardial infarction (HCC) 07/19/2020   per patient-2 heart stents placed   Neuromuscular disorder (HCC) 02/05/2015   Bells Palsy, right   Pneumonia    10 years ago per pt    Past Surgical History:  Procedure Laterality Date   ABDOMINAL HYSTERECTOMY     partial   ANTERIOR CERVICAL DECOMPRESSION/DISCECTOMY FUSION 4 LEVELS N/A 04/03/2020   Procedure: ANTERIOR CERVICAL DECOMPRESSION/DISCECTOMY FUSION C3-7;  Surgeon: Burnetta Aures, MD;  Location: [facility] OR;  Service: Orthopedics;  Laterality: N/A;  5 hrs   APPENDECTOMY     CATARACT EXTRACTION Bilateral    about 3 years ago    CHOLECYSTECTOMY     CORONARY STENT INTERVENTION N/A 07/19/2020   Procedure: CORONARY STENT INTERVENTION;  Surgeon: Claudene Victory ORN, MD;  Location: [facility] INVASIVE CV LAB;  Service: Cardiovascular;  Laterality: N/A;   HIP CLOSED REDUCTION Left 01/11/2024   Procedure: CLOSED REDUCTION, HIP;  Surgeon: Burnetta Aures, MD;  Location: [facility] ORS;  Service: Orthopedics;  Laterality: Left;   KNEE ARTHROSCOPY  1999   Dr Gerome -- R knee   LEFT HEART CATH AND CORONARY ANGIOGRAPHY N/A 07/19/2020   Procedure: LEFT HEART CATH AND CORONARY ANGIOGRAPHY;  Surgeon: Claudene Pacific, MD;  Location: [facility] INVASIVE CV LAB;  Service: Cardiovascular;  Laterality: N/A;   LUMBAR LAMINECTOMY/DECOMPRESSION MICRODISCECTOMY Right 08/26/2016   Procedure: Microlumbar decompression L4-5, L5-S1 on Right;  Surgeon: Reyes Billing, MD;  Location: [facility] OR[facility];  Service: Orthopedics;  Laterality: Right;   POSTERIOR CERVICAL FUSION/FORAMINOTOMY N/A 04/04/2020   Procedure: POSTERIOR SPINAL FUSION INSTRUMENTATION CERVICAL  THREE THROUGH SEVEN, CERVICAL THREE LAMINOTOMY;  Surgeon: Burnetta Aures, MD;  Location: [facility] OR;  Service: Orthopedics;  Laterality: N/A;  5 hrs   TOTAL HIP ARTHROPLASTY Right 04/01/2017   Procedure: RIGHT TOTAL HIP ARTHROPLASTY ANTERIOR APPROACH;  Surgeon: Fidel Rogue, MD;  Location: [facility] OR[facility];  Service: Orthopedics;  Laterality: Right;  Needs RNFA   TOTAL HIP ARTHROPLASTY Left 01/07/2023   Procedure: TOTAL HIP ARTHROPLASTY ANTERIOR APPROACH;  Surgeon: Fidel Rogue, MD;  Location: [facility] OR[facility];  Service: Orthopedics;  Laterality: Left;  120    Current Outpatient Medications  Medication Sig Dispense Refill Last Dose/Taking   acetaminophen  (TYLENOL ) 650 MG CR tablet Take 650 mg by mouth 2 (two) times daily as needed for pain.      aspirin  EC 81 MG tablet Take 81 mg by mouth in the morning. Swallow whole.      Cyanocobalamin  (VITAMIN B-12 PO) Take 1 drop by mouth in the morning.      ENBREL SURECLICK 50 MG/ML injection Inject 50 mg into the skin every Sunday.      furosemide  (LASIX ) 20 MG tablet TAKE 1-2 TABLETS BY MOUTH DAILY AS NEEDED FOR EDEMA 120 tablet 1    leflunomide  (ARAVA ) 20 MG tablet Take 20 mg by mouth daily after breakfast.      Multiple Vitamins-Minerals (IMMUNE SUPPORT PO) Take 1  tablet by mouth daily after breakfast. Nature's Bounty Immune 24 Hour +      nitroGLYCERIN  (NITROSTAT ) 0.4 MG SL tablet Place 1 tablet (0.4 mg total) under the tongue every 5 (five) minutes x 3 doses as needed for chest pain. 25 tablet 1    OVER THE COUNTER MEDICATION Take 2 capsules by mouth in the morning and at bedtime. Lion's Mane 50 mg/capsule      potassium chloride  (KLOR-CON  M) 10 MEQ tablet Take 1 tablet (10 mEq total) by mouth daily. (Patient taking differently: Take 10 mEq by mouth daily as needed (when taking furosemide ).) 90 tablet 3    predniSONE  (DELTASONE ) 1 MG tablet Take 4 mg by mouth daily after breakfast.      Specialty Vitamins Products (MENOPAUSE SUPPORT PO) Take 1 tablet by mouth daily after  breakfast. Amberen Menopause      traMADol  (ULTRAM ) 50 MG tablet Take 100 mg by mouth every 6 (six) hours as needed (pain.).      Vitamin D , Ergocalciferol , (DRISDOL ) 1.25 MG (50000 UNIT) CAPS capsule Take 1 capsule (50,000 Units total) by mouth every 14 (fourteen) days. 6 capsule 3    zolpidem  (AMBIEN ) 10 MG tablet TAKE 1 TABLET BY MOUTH AT BEDTIME FOR SLEEP 90 tablet 1    No current facility-administered medications for this visit.   Allergies  Allergen Reactions   Aspirin  Other (See Comments)    UPSET STOMACH. MUST HAVE COATED ASA   Codeine Sulfate Other (See Comments)    drugged out feeling & hallucinations   Diltiazem      fatigue   Gabapentin      Too sedated   Kevzara  [Sarilumab ] Other (See Comments)     increased cholesterol     Lipitor [Atorvastatin] Other (See Comments)    Muscle cramps   Methotrexate  Nausea And Vomiting and Swelling   Miralax  [Polyethylene Glycol]     bloating   Nabumetone Other (See Comments)    Unsure of reaction (reaction occurred sometime ago)   Plaquenil [Hydroxychloroquine]     Weak, blurred vision   Pravastatin      Weak legs, arthralgias   Senna Other (See Comments)    Causes bleeding from the anus   Statins Other (See Comments)    Muscle aches and cramps    Social History   Tobacco Use   Smoking status: Former    Current packs/day: 0.00    Average packs/day: 0.5 packs/day for 58.0 years (29.0 ttl pk-yrs)    Types: Cigarettes    Start date: 02/16/1960    Quit date: 02/15/2018    Years since quitting: 6.0   Smokeless tobacco: Never  Substance Use Topics   Alcohol  use: No    Family History  Problem Relation Age of Onset   Heart disease Mother 21       CHF   COPD Father 13       emphesema      Review of Systems  Musculoskeletal:  Positive for arthralgias, back pain, gait problem and joint swelling.  All other systems reviewed and are negative.   Objective:  Physical Exam Constitutional:      Appearance: Normal  appearance.  HENT:     Head: Normocephalic and atraumatic.     Nose: Nose normal.     Mouth/Throat:     Mouth: Mucous membranes are moist.     Pharynx: Oropharynx is clear.  Eyes:     Conjunctiva/sclera: Conjunctivae normal.  Cardiovascular:     Rate and Rhythm: Normal rate  and regular rhythm.     Pulses: Normal pulses.     Heart sounds: Normal heart sounds.  Pulmonary:     Effort: Pulmonary effort is normal.     Breath sounds: Normal breath sounds.  Abdominal:     General: Abdomen is flat.     Palpations: Abdomen is soft.  Genitourinary:    Comments: deferred Musculoskeletal:     Cervical back: Normal range of motion and neck supple.     Comments: Examination of the left hip reveals a well healed incision. Mild swelling, no erythema. No subcutaneous fluid collection. Painless log rolling of the hip. She has trochanteric tenderness to palpation. Abductor strength is 4/5.  Sensory and motor function intact in LE bilaterally. Distal pedal pulses 2+ bilaterally.  Mild pitting pedal edema in LE bilaterally, at baseline. Calves soft and non-tender.  Skin:    General: Skin is warm and dry.     Capillary Refill: Capillary refill takes less than 2 seconds.  Neurological:     General: No focal deficit present.     Mental Status: She is alert and oriented to person, place, and time.  Psychiatric:        Mood and Affect: Mood normal.        Behavior: Behavior normal.        Thought Content: Thought content normal.        Judgment: Judgment normal.     Vital signs in last 24 hours: @VSRANGES @   Labs:   Estimated body mass index is 26.76 kg/m as calculated from the following:   Height as of an earlier encounter on 02/15/24: 5' (1.524 m).   Weight as of an earlier encounter on 02/15/24: 62.1 kg.  Imaging Review:  Plain radiographs demonstrate severe degenerative joint disease of the left hip(s). The bone quality appears to be adequate for age and reported activity level. There  is no evidence of loosening.    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  The patient history, physical examination, clinical judgement of the provider and imaging studies are consistent with end stage degenerative joint disease of the left hip(s), previous total hip arthroplasty. Revision total hip arthroplasty is deemed medically necessary. The treatment options including medical management, injection therapy, arthroscopy and arthroplasty were discussed at length. The risks and benefits of total hip arthroplasty were presented and reviewed. The risks due to aseptic loosening, infection, stiffness, dislocation/subluxation,  thromboembolic complications and other imponderables were discussed.  The patient acknowledged the explanation, agreed to proceed with the plan and consent was signed. Patient is being admitted for inpatient treatment for surgery, pain control, PT, OT, prophylactic antibiotics, VTE prophylaxis, progressive ambulation and ADL's and discharge planning. The patient is planning to be discharged home with HEP after an overnight stay.   Therapy Plans: HEP.  Disposition: Home with husband.  Planned DVT Prophylaxis: aspirin  81mg  BID needs coated.  DME needed: Has rolling walker.  PCP: Cleared. Consider stress dose steroids.  Cardiology: Cleared. Okay to hold aspirin  81mg  3 days prior to surgery.  Rheumatology: Hold enbrel 1 week prior to surgery, restart 1-2 weeks PO. Continue low dose prednisone  for adrenal insufficiency. Continue hydrochloroquine and Arava  (patient states no longer taking).  TXA: IV Allergies:  - aspirin  - upset stomach, must have coated.  - Atorvastatin - muscle cramps. - Codeine - hallucinations. - diltiazem  - fatigue. - Gabapentin  - sedation. - Hydroxychloroquine - weak/blurred vision.  - Methotrexate  - swelling, hair loss.  - Morphine  -  doesn't like the way it makes her feel. - Nabumetone - unsure. - Miralax  - bloating.   - sarilumab  - increased cholesterol - Senna - bleeding from anus. Anesthesia Concerns: None. BMI: 27 Last HgbA1c: not diabetic.  Other: - Adrenal insufficiency history. PCP recommends to consider stress dose steroids.  - COPD history.  - CAD, 2 stents 2021.  - RA, Enbrel weekly injections. Last injection 02/13/24. Hold further injection until incision fully healed.  - Leflunomide  - Stop today, discussed to hold until incision fully healed.  - Staples** - Prednisone  4mg  daily.  - Aspirin  81mg  baseline.  - Tramadol  4x/day at baseline. PCP.  - Hydrocodone , zofran .  - Prune juice for constipation post-op. - 02/15/24: Hgb 14.1, K+ 4.4, Cr. 0.59. - Mupirocin .

## 2024-02-15 NOTE — H&P (Signed)
 TOTAL HIP REVISION ADMISSION H&P  Patient is admitted for left revision total hip arthroplasty.  Subjective:  Chief Complaint: left hip pain  HPI: Debra Farmer, 78 y.o. female, has a history of pain and functional disability in the left hip due to recurrent instability and patient has failed non-surgical conservative treatments for greater than 12 weeks to include flexibility and strengthening excercises, use of assistive devices, and activity modification. The indications for the revision total hip arthroplasty are recurrent instability.  Onset of symptoms was abrupt starting 1 years ago with rapidlly worsening course since that time.  Prior procedures on the left hip include arthroplasty and closed reductions under anesthesia.  Patient currently rates pain in the left hip at 10 out of 10 with activity.  There is night pain, worsening of pain with activity and weight bearing, trendelenberg gait, pain that interfers with activities of daily living, and pain with passive range of motion. Patient has evidence of left total hip arthroplasty by imaging studies.  This condition presents safety issues increasing the risk of falls.  This patient has had failed left total hip arthroplasty.  There is no current active infection.  Patient Active Problem List   Diagnosis Date Noted   Vitamin B12 deficiency 11/29/2023   Neoplasm of uncertain behavior of skin 11/29/2023   Obesity (BMI 30-39.9) 06/23/2023   Osteoarthritis of left hip 01/07/2023   Hepatitis A antibody positive 10/05/2022   Trochanteric bursitis of left hip 10/05/2022   S/P lumbar fusion 05/27/2022   Baker's cyst, ruptured 11/11/2021   Edema 11/11/2021   Generalized osteoarthritis 11/11/2021   Other long term (current) drug therapy 11/11/2021   Other specified abnormal immunological findings in serum 11/11/2021   Polyarthralgia 11/11/2021   Sciatica 11/11/2021   Constipation 11/11/2021   Degenerative joint disease involving multiple  joints 11/11/2021   Hypercholesterolemia 06/23/2021   Neuropathy, peripheral 11/04/2020   Bilateral carpal tunnel syndrome 08/21/2020   Statin myopathy 08/11/2020   Pain in right hand 08/07/2020   Acute coronary syndrome (HCC) 07/19/2020   Coronary artery disease    COVID-19 05/16/2020   Cervical myelopathy (HCC) 04/03/2020   Grief at loss of child 03/20/2020   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   Preop exam for internal medicine 03/20/2020   Bilateral hand numbness 01/24/2020   Paraplegia (HCC) 11/21/2019   Pain in left foot 12/30/2018   Osteopenia 06/21/2018   Peripheral vascular disease (HCC) 06/16/2018   Spondylolisthesis, grade 1 06/16/2018   Degeneration of lumbar intervertebral disc 02/03/2018   Scoliosis of lumbar spine 02/03/2018   History of total hip arthroplasty 12/03/2017   Coronary atherosclerosis 09/22/2017   Chest pain, atypical 09/22/2017   Primary osteoarthritis of right hip 04/01/2017   Osteoarthritis of right hip 04/01/2017   Osteoporosis 03/03/2017   Decreased libido 10/19/2016   Spinal stenosis at L4-L5 level 08/26/2016   Hip osteoarthritis 04/28/2016   Neuropathy of right peroneal nerve 02/10/2016   Diarrhea 09/23/2015   Bell's palsy 03/02/2015   Seronegative rheumatoid arthritis (HCC) 03/02/2015   Neck pain 04/26/2014   Herpes zoster 04/05/2014   Thrush, oral 03/06/2014   Left arm swelling 01/15/2013   Cough 09/19/2012   Insomnia 09/01/2012   CTS (carpal tunnel syndrome) 04/15/2012   Arthritis of multiple sites 03/04/2012   Well adult exam 01/27/2012   Knee pain, bilateral 01/08/2012   Left hip pain 12/26/2010   DISCITIS 07/25/2010   SWEATING 05/23/2010   PARESTHESIA, HANDS 04/25/2010   COPD mixed type (HCC) 07/26/2009  SINUSITIS, ACUTE 08/01/2008   RASH AND OTHER NONSPECIFIC SKIN ERUPTION 08/01/2008   TOBACCO USE DISORDER/SMOKER-SMOKING CESSATION DISCUSSED 03/08/2008   BRONCHITIS, ACUTE 03/08/2008   Dyslipidemia 02/27/2008   CRAMP OF  LIMB 02/27/2008   FATIGUE 02/27/2008   Wheezing 02/27/2008   Meningitis 08/11/2007   GERD 08/11/2007   Headache 08/11/2007   Past Medical History:  Diagnosis Date   Arthritis    RA   Cancer (HCC) 1972   uterine cancer-hysterectomy   COPD (chronic obstructive pulmonary disease) (HCC)    Coronary artery disease    GERD (gastroesophageal reflux disease)    Myocardial infarction (HCC) 07/19/2020   per patient-2 heart stents placed   Neuromuscular disorder (HCC) 02/05/2015   Bells Palsy, right   Pneumonia    10 years ago per pt    Past Surgical History:  Procedure Laterality Date   ABDOMINAL HYSTERECTOMY     partial   ANTERIOR CERVICAL DECOMPRESSION/DISCECTOMY FUSION 4 LEVELS N/A 04/03/2020   Procedure: ANTERIOR CERVICAL DECOMPRESSION/DISCECTOMY FUSION C3-7;  Surgeon: Burnetta Aures, MD;  Location: [facility];  Service: Orthopedics;  Laterality: N/A;  5 hrs   APPENDECTOMY     CATARACT EXTRACTION Bilateral    about 3 years ago    CHOLECYSTECTOMY     CORONARY STENT INTERVENTION N/A 07/19/2020   Procedure: CORONARY STENT INTERVENTION;  Surgeon: Claudene Victory ORN, MD;  Location: [facility];  Service: Cardiovascular;  Laterality: N/A;   HIP CLOSED REDUCTION Left 01/11/2024   Procedure: CLOSED REDUCTION, HIP;  Surgeon: Burnetta Aures, MD;  Location: [facility];  Service: Orthopedics;  Laterality: Left;   KNEE ARTHROSCOPY  1999   Dr Gerome -- R knee   LEFT HEART CATH AND CORONARY ANGIOGRAPHY N/A 07/19/2020   Procedure: LEFT HEART CATH AND CORONARY ANGIOGRAPHY;  Surgeon: Claudene Pacific, MD;  Location: [facility];  Service: Cardiovascular;  Laterality: N/A;   LUMBAR LAMINECTOMY/DECOMPRESSION MICRODISCECTOMY Right 08/26/2016   Procedure: Microlumbar decompression L4-5, L5-S1 on Right;  Surgeon: Reyes Billing, MD;  Location: [facility];  Service: Orthopedics;  Laterality: Right;   POSTERIOR CERVICAL FUSION/FORAMINOTOMY N/A 04/04/2020   Procedure: POSTERIOR SPINAL FUSION INSTRUMENTATION CERVICAL  THREE THROUGH SEVEN, CERVICAL THREE LAMINOTOMY;  Surgeon: Burnetta Aures, MD;  Location: [facility];  Service: Orthopedics;  Laterality: N/A;  5 hrs   TOTAL HIP ARTHROPLASTY Right 04/01/2017   Procedure: RIGHT TOTAL HIP ARTHROPLASTY ANTERIOR APPROACH;  Surgeon: Fidel Rogue, MD;  Location: [facility];  Service: Orthopedics;  Laterality: Right;  Needs RNFA   TOTAL HIP ARTHROPLASTY Left 01/07/2023   Procedure: TOTAL HIP ARTHROPLASTY ANTERIOR APPROACH;  Surgeon: Fidel Rogue, MD;  Location: [facility];  Service: Orthopedics;  Laterality: Left;  120    Current Outpatient Medications  Medication Sig Dispense Refill Last Dose/Taking   acetaminophen  (TYLENOL ) 650 MG CR tablet Take 650 mg by mouth 2 (two) times daily as needed for pain.      aspirin  EC 81 MG tablet Take 81 mg by mouth in the morning. Swallow whole.      Cyanocobalamin  (VITAMIN B-12 PO) Take 1 drop by mouth in the morning.      ENBREL SURECLICK 50 MG/ML injection Inject 50 mg into the skin every Sunday.      furosemide  (LASIX ) 20 MG tablet TAKE 1-2 TABLETS BY MOUTH DAILY AS NEEDED FOR EDEMA 120 tablet 1    leflunomide  (ARAVA ) 20 MG tablet Take 20 mg by mouth daily after breakfast.      Multiple Vitamins-Minerals (IMMUNE SUPPORT PO) Take 1  tablet by mouth daily after breakfast. Nature's Bounty Immune 24 Hour +      nitroGLYCERIN  (NITROSTAT ) 0.4 MG SL tablet Place 1 tablet (0.4 mg total) under the tongue every 5 (five) minutes x 3 doses as needed for chest pain. 25 tablet 1    OVER THE COUNTER MEDICATION Take 2 capsules by mouth in the morning and at bedtime. [place] 50 mg/capsule      potassium chloride  (KLOR-CON  M) 10 MEQ tablet Take 1 tablet (10 mEq total) by mouth daily. (Patient taking differently: Take 10 mEq by mouth daily as needed (when taking furosemide ).) 90 tablet 3    predniSONE  (DELTASONE ) 1 MG tablet Take 4 mg by mouth daily after breakfast.      Specialty Vitamins Products (MENOPAUSE SUPPORT PO) Take 1 tablet by mouth daily after  breakfast. Amberen Menopause      traMADol  (ULTRAM ) 50 MG tablet Take 100 mg by mouth every 6 (six) hours as needed (pain.).      Vitamin D , Ergocalciferol , (DRISDOL ) 1.25 MG (50000 UNIT) CAPS capsule Take 1 capsule (50,000 Units total) by mouth every 14 (fourteen) days. 6 capsule 3    zolpidem  (AMBIEN ) 10 MG tablet TAKE 1 TABLET BY MOUTH AT BEDTIME FOR SLEEP 90 tablet 1    No current facility-administered medications for this visit.   Allergies  Allergen Reactions   Aspirin  Other (See Comments)    UPSET STOMACH. MUST HAVE COATED ASA   Codeine Sulfate Other (See Comments)    drugged out feeling & hallucinations   Diltiazem      fatigue   Gabapentin      Too sedated   Kevzara  [Sarilumab ] Other (See Comments)     increased cholesterol     Lipitor [Atorvastatin] Other (See Comments)    Muscle cramps   Methotrexate  Nausea And Vomiting and Swelling   Miralax  [Polyethylene Glycol]     bloating   Nabumetone Other (See Comments)    Unsure of reaction (reaction occurred sometime ago)   Plaquenil [Hydroxychloroquine]     Weak, blurred vision   Pravastatin      Weak legs, arthralgias   Senna Other (See Comments)    Causes bleeding from the anus   Statins Other (See Comments)    Muscle aches and cramps    Social History   Tobacco Use   Smoking status: Former    Current packs/day: 0.00    Average packs/day: 0.5 packs/day for 58.0 years (29.0 ttl pk-yrs)    Types: Cigarettes    Start date: 02/16/1960    Quit date: 02/15/2018    Years since quitting: 6.0   Smokeless tobacco: Never  Substance Use Topics   Alcohol  use: No    Family History  Problem Relation Age of Onset   Heart disease Mother 51       CHF   COPD Father 64       emphesema      Review of Systems  Musculoskeletal:  Positive for arthralgias, back pain, gait problem and joint swelling.  All other systems reviewed and are negative.   Objective:  Physical Exam Constitutional:      Appearance: Normal  appearance.  HENT:     Head: Normocephalic and atraumatic.     Nose: Nose normal.     Mouth/Throat:     Mouth: Mucous membranes are moist.     Pharynx: Oropharynx is clear.  Eyes:     Conjunctiva/sclera: Conjunctivae normal.  Cardiovascular:     Rate and Rhythm: Normal rate  and regular rhythm.     Pulses: Normal pulses.     Heart sounds: Normal heart sounds.  Pulmonary:     Effort: Pulmonary effort is normal.     Breath sounds: Normal breath sounds.  Abdominal:     General: Abdomen is flat.     Palpations: Abdomen is soft.  Genitourinary:    Comments: deferred Musculoskeletal:     Cervical back: Normal range of motion and neck supple.     Comments: Examination of the left hip reveals a well healed incision. Mild swelling, no erythema. No subcutaneous fluid collection. Painless log rolling of the hip. She has trochanteric tenderness to palpation. Abductor strength is 4/5.  Sensory and motor function intact in LE bilaterally. Distal pedal pulses 2+ bilaterally.  Mild pitting pedal edema in LE bilaterally, at baseline. Calves soft and non-tender.  Skin:    General: Skin is warm and dry.     Capillary Refill: Capillary refill takes less than 2 seconds.  Neurological:     General: No focal deficit present.     Mental Status: She is alert and oriented to person, place, and time.  Psychiatric:        Mood and Affect: Mood normal.        Behavior: Behavior normal.        Thought Content: Thought content normal.        Judgment: Judgment normal.     Vital signs in last 24 hours: @VSRANGES @   Labs:   Estimated body mass index is 26.76 kg/m as calculated from the following:   Height as of an earlier encounter on 02/15/24: 5' (1.524 m).   Weight as of an earlier encounter on 02/15/24: 62.1 kg.  Imaging Review:  Plain radiographs demonstrate severe degenerative joint disease of the left hip(s). The bone quality appears to be adequate for age and reported activity level. There  is no evidence of loosening.    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  The patient history, physical examination, clinical judgement of the provider and imaging studies are consistent with end stage degenerative joint disease of the left hip(s), previous total hip arthroplasty. Revision total hip arthroplasty is deemed medically necessary. The treatment options including medical management, injection therapy, arthroscopy and arthroplasty were discussed at length. The risks and benefits of total hip arthroplasty were presented and reviewed. The risks due to aseptic loosening, infection, stiffness, dislocation/subluxation,  thromboembolic complications and other imponderables were discussed.  The patient acknowledged the explanation, agreed to proceed with the plan and consent was signed. Patient is being admitted for inpatient treatment for surgery, pain control, PT, OT, prophylactic antibiotics, VTE prophylaxis, progressive ambulation and ADL's and discharge planning. The patient is planning to be discharged home with HEP after an overnight stay.   Therapy Plans: HEP.  Disposition: Home with husband.  Planned DVT Prophylaxis: aspirin  81mg  BID needs coated.  DME needed: Has rolling walker.  PCP: Cleared. Consider stress dose steroids.  Cardiology: Cleared. Okay to hold aspirin  81mg  3 days prior to surgery.  Rheumatology: Hold enbrel 1 week prior to surgery, restart 1-2 weeks PO. Continue low dose prednisone  for adrenal insufficiency. Continue hydrochloroquine and Arava  (patient states no longer taking).  TXA: IV Allergies:  - aspirin  - upset stomach, must have coated.  - Atorvastatin - muscle cramps. - Codeine - hallucinations. - diltiazem  - fatigue. - Gabapentin  - sedation. - Hydroxychloroquine - weak/blurred vision.  - Methotrexate  - swelling, hair loss.  - Morphine  -  doesn't like the way it makes her feel. - Nabumetone - unsure. - Miralax  - bloating.   - sarilumab  - increased cholesterol - Senna - bleeding from anus. Anesthesia Concerns: None. BMI: 27 Last HgbA1c: not diabetic.  Other: - Adrenal insufficiency history. PCP recommends to consider stress dose steroids.  - COPD history.  - CAD, 2 stents 2021.  - RA, Enbrel weekly injections. Last injection 02/13/24. Hold further injection until incision fully healed.  - Leflunomide  - Stop today, discussed to hold until incision fully healed.  - Staples** - Prednisone  4mg  daily.  - Aspirin  81mg  baseline.  - Tramadol  4x/day at baseline. PCP.  - Hydrocodone , zofran .  - Prune juice for constipation post-op. - 02/15/24: Hgb 14.1, K+ 4.4, Cr. 0.59. - Mupirocin .

## 2024-02-15 NOTE — Progress Notes (Signed)
 For Anesthesia: PCP - Plotnikov, Karlynn GAILS, MD  Cardiologist - Claudene Pacific, MD : records requested on 02/16/24  Bowel Prep reminder:  Chest x-ray -  EKG - 03/04/23 Stress Test -  ECHO - 07/19/20 Cardiac Cath - 07/19/20 Pacemaker/ICD device last checked: Pacemaker orders received: Device Rep notified:  Spinal Cord Stimulator:N/A  Sleep Study - N/A CPAP -   Fasting Blood Sugar - N/A Checks Blood Sugar _____ times a day Date and result of last Hgb A1c-  Last dose of GLP1 agonist- N/A GLP1 instructions: Hold 7 days prior to schedule (Hold 24 hours-daily)   Last dose of SGLT-2 inhibitors- N/A SGLT-2 instructions: Hold 72 hours prior to surgery  Blood Thinner Instructions: Aspirin  Instructions:To hold it 3 days before surgery. Last Dose:  Activity level:    Unable to go up a flight of stairs due to hip pain.    Anesthesia review: Hx: MI,COPD,CAD  Patient denies shortness of breath, fever, cough and chest pain at PAT appointment   Patient verbalized understanding of instructions that were reviewed over the telephone.

## 2024-02-16 NOTE — Progress Notes (Signed)
 PCR: + STAPH.

## 2024-02-17 ENCOUNTER — Encounter (HOSPITAL_COMMUNITY)

## 2024-02-21 ENCOUNTER — Encounter (HOSPITAL_COMMUNITY): Payer: Self-pay

## 2024-02-21 NOTE — Progress Notes (Signed)
 Case: [id number] Date/Time: 02/24/24 1145   Procedure: TOTAL HIP REVISION (Left: Hip)   Anesthesia type: Spinal   Diagnosis: Failure of left total hip arthroplasty, initial encounter (HCC) [T84.011A]   Pre-op diagnosis: Failed Left total hip arthroplasty   [facility]   Surgeons: Fidel Rogue, MD       DISCUSSION: Debra Farmer is a 78 yo female with PMH of former smoking, hx of NSTEMI and CAD s/p PCI to RCA and LCx (2022), mild AS, COPD, GERD, RA on Prednisone , s/p C3-7 anterior/posterior fusion, lumbar fusion L4-5 (2023)  Patient had her left hip replaced in 12/2022. She has had problems with recurrent dislocations since then. She last had to have a closed reduction in the OR on 01/11/24 under general anesthesia. No complications noted.  Patient follows with Dr. Claudene for hx of NSTEMI and CAD s/p PCI x 2 in 2022. Last seen on ***  Last seen by PCP on 11/29/23. All issues stable at that time.   Coronary artery disease   Remote No CP  VS: BP (!) 140/74   Pulse 83   Temp 36.9 C (Oral)   Ht 5' (1.524 m)   Wt 62.1 kg   SpO2 94%   BMI 26.76 kg/m   PROVIDERS: Plotnikov, Karlynn GAILS, MD   LABS: {[facility] AN LABS REVIEWED:[id number]::Labs reviewed: Acceptable for surgery.} (all labs ordered are listed, but only abnormal results are displayed)  Labs Reviewed  SURGICAL PCR SCREEN - Abnormal; Notable for the following components:      Result Value   Staphylococcus aureus POSITIVE (*)    All other components within normal limits  BASIC METABOLIC PANEL WITH GFR - Abnormal; Notable for the following components:   Glucose, Bld 111 (*)    All other components within normal limits  CBC  TYPE AND SCREEN     IMAGES:   EKG:   CV:  Cath and PCI 07/19/2020: A stent was successfully placed.   Complicated procedure due to unfamiliarity with the patient's clinical history, ongoing severe chest discomfort, and technical issues associated with the left circumflex  intervention. 85% mid right coronary with TIMI III flow was reduced to 10% with TIMI grade III flow using a 30 x 3.5 Onyx deployed at high pressure. 90% mid circumflex stenosis with TIMI grade II-III flow treated with a 2.5 x 12 mm Onyx reducing stenosis to less than 10% with TIMI grade III flow.   RECOMMENDATIONS:              36-month duration of dual antiplatelet therapy.  Aspirin  and Brilinta  for now.  Can switch to clopidogrel and drop Brilinta  after 30 days. Management, general orders, and clinical follow-up per Dr. Salena Claudene.   TTE 07/19/2020:  1. Left ventricular ejection fraction, by estimation, is 55 to 60%. The  left ventricle has normal function. The left ventricle has no regional  wall motion abnormalities. There is mild concentric left ventricular  hypertrophy. Left ventricular diastolic  parameters are consistent with Grade I diastolic dysfunction (impaired  relaxation).   2. Right ventricular systolic function is normal. The right ventricular  size is normal. Mildly increased right ventricular wall thickness.   3. Left atrial size was mildly dilated.   4. Right atrial size was mildly dilated.   5. The mitral valve is degenerative. Mild mitral valve regurgitation.   6. The aortic valve is tricuspid. There is moderate calcification of the  aortic valve. There is moderate thickening of the aortic  valve. Aortic  valve regurgitation is mild. Mild aortic valve stenosis.   7. There is mild (Grade II) atheroma plaque involving the aortic root and  ascending aorta.   8. The inferior vena cava is normal in size with greater than 50%  respiratory variability, suggesting right atrial pressure of 3 mmHg.   Past Medical History:  Diagnosis Date   Arthritis    RA   Cancer (HCC) 1972   uterine cancer-hysterectomy   COPD (chronic obstructive pulmonary disease) (HCC)    Coronary artery disease    GERD (gastroesophageal reflux disease)    Myocardial infarction (HCC) 07/19/2020    per patient-2 heart stents placed   Neuromuscular disorder (HCC) 02/05/2015   Bells Palsy, right   Pneumonia    10 years ago per pt    Past Surgical History:  Procedure Laterality Date   ABDOMINAL HYSTERECTOMY     partial   ANTERIOR CERVICAL DECOMPRESSION/DISCECTOMY FUSION 4 LEVELS N/A 04/03/2020   Procedure: ANTERIOR CERVICAL DECOMPRESSION/DISCECTOMY FUSION C3-7;  Surgeon: Burnetta Aures, MD;  Location: [facility];  Service: Orthopedics;  Laterality: N/A;  5 hrs   APPENDECTOMY     CATARACT EXTRACTION Bilateral    about 3 years ago    CHOLECYSTECTOMY     CORONARY STENT INTERVENTION N/A 07/19/2020   Procedure: CORONARY STENT INTERVENTION;  Surgeon: Claudene Victory ORN, MD;  [facility]: [facility];  Service: Cardiovascular;  Laterality: N/A;   HIP CLOSED REDUCTION Left 01/11/2024   Procedure: CLOSED REDUCTION, HIP;  Surgeon: Burnetta Aures, MD;  Location: [facility];  Service: Orthopedics;  Laterality: Left;   KNEE ARTHROSCOPY  1999   Dr Gerome -- R knee   LEFT HEART CATH AND CORONARY ANGIOGRAPHY N/A 07/19/2020   Procedure: LEFT HEART CATH AND CORONARY ANGIOGRAPHY;  Surgeon: Claudene Pacific, MD;  [facility]: [facility];  Service: Cardiovascular;  Laterality: N/A;   LUMBAR LAMINECTOMY/DECOMPRESSION MICRODISCECTOMY Right 08/26/2016   Procedure: Microlumbar decompression L4-5, L5-S1 on Right;  Surgeon: Reyes Billing, MD;  Location: [facility];  Service: Orthopedics;  Laterality: Right;   POSTERIOR CERVICAL FUSION/FORAMINOTOMY N/A 04/04/2020   Procedure: POSTERIOR SPINAL FUSION INSTRUMENTATION CERVICAL THREE THROUGH SEVEN, CERVICAL THREE LAMINOTOMY;  Surgeon: Burnetta Aures, MD;  Location: [facility];  Service: Orthopedics;  Laterality: N/A;  5 hrs   TOTAL HIP ARTHROPLASTY Right 04/01/2017   Procedure: RIGHT TOTAL HIP ARTHROPLASTY ANTERIOR APPROACH;  Surgeon: Fidel Rogue, MD;  Location: [facility];  Service: Orthopedics;  Laterality: Right;  Needs RNFA   TOTAL HIP ARTHROPLASTY Left 01/07/2023   Procedure:  TOTAL HIP ARTHROPLASTY ANTERIOR APPROACH;  Surgeon: Fidel Rogue, MD;  Location: [facility];  Service: Orthopedics;  Laterality: Left;  120    MEDICATIONS:  acetaminophen  (TYLENOL ) 650 MG CR tablet   aspirin  EC 81 MG tablet   Cyanocobalamin  (VITAMIN B-12 PO)   ENBREL SURECLICK 50 MG/ML injection   furosemide  (LASIX ) 20 MG tablet   leflunomide  (ARAVA ) 20 MG tablet   Multiple Vitamins-Minerals (IMMUNE SUPPORT PO)   nitroGLYCERIN  (NITROSTAT ) 0.4 MG SL tablet   OVER THE COUNTER MEDICATION   potassium chloride  (KLOR-CON  M) 10 MEQ tablet   predniSONE  (DELTASONE ) 1 MG tablet   Specialty Vitamins Products (MENOPAUSE SUPPORT PO)   traMADol  (ULTRAM ) 50 MG tablet   Vitamin D , Ergocalciferol , (DRISDOL ) 1.25 MG (50000 UNIT) CAPS capsule   zolpidem  (AMBIEN ) 10 MG tablet   No current facility-administered medications for this encounter.

## 2024-02-23 NOTE — Anesthesia Preprocedure Evaluation (Signed)
 Anesthesia Evaluation  Patient identified by MRN, date of birth, ID band Patient awake    Reviewed: Allergy & Precautions, NPO status , Patient's Chart, lab work & pertinent test results  History of Anesthesia Complications Negative for: history of anesthetic complications  Airway Mallampati: III  TM Distance: >3 FB Neck ROM: Full    Dental  (+) Edentulous Upper, Edentulous Lower, Dental Advisory Given   Pulmonary neg shortness of breath, neg sleep apnea, COPD, neg recent URI, former smoker   breath sounds clear to auscultation       Cardiovascular + CAD, + Past MI, + Cardiac Stents and + Peripheral Vascular Disease   Rhythm:Regular  1. Left ventricular ejection fraction, by estimation, is 55 to 60%. The  left ventricle has normal function. The left ventricle has no regional  wall motion abnormalities. There is mild concentric left ventricular  hypertrophy. Left ventricular diastolic  parameters are consistent with Grade I diastolic dysfunction (impaired  relaxation).   2. Right ventricular systolic function is normal. The right ventricular  size is normal. Mildly increased right ventricular wall thickness.   3. Left atrial size was mildly dilated.   4. Right atrial size was mildly dilated.   5. The mitral valve is degenerative. Mild mitral valve regurgitation.   6. The aortic valve is tricuspid. There is moderate calcification of the  aortic valve. There is moderate thickening of the aortic valve. Aortic  valve regurgitation is mild. Mild aortic valve stenosis.   7. There is mild (Grade II) atheroma plaque involving the aortic root and  ascending aorta.   8. The inferior vena cava is normal in size with greater than 50%  respiratory variability, suggesting right atrial pressure of 3 mmHg.     1st Diag lesion is 70% stenosed.  2nd Diag lesion is 75% stenosed.  Mid Cx lesion is 90% stenosed.  Prox RCA to Mid RCA lesion is  85% stenosed.   Dr. Claudene placed 3.5 x 30 Onyx drug eluting stent in RCA, reduced 85 % lesion to 0 % with TIMI grade III flow and 2.5 x 12 mm Onyx stent in LCx, reduced 90 % lesion to 0 % with TIMI grade 3 flow.      Neuro/Psych  Headaches  Neuromuscular disease    GI/Hepatic   Endo/Other  negative endocrine ROS    Renal/GU Lab Results      Component                Value               Date                      NA                       137                 02/15/2024                K                        4.4                 02/15/2024                CO2                      25  02/15/2024                GLUCOSE                  111 (H)             02/15/2024                BUN                      11                  02/15/2024                CREATININE               0.59                02/15/2024                CALCIUM                  9.4                 02/15/2024                GFR                      86.07               11/29/2023                GFRNONAA                 >60                 02/15/2024                Musculoskeletal  (+) Arthritis ,    Abdominal   Peds  Hematology negative hematology ROS (+) Lab Results      Component                Value               Date                      WBC                      8.9                 02/15/2024                HGB                      14.1                02/15/2024                HCT                      44.5                02/15/2024                MCV                      89.0  02/15/2024                PLT                      238                 02/15/2024             Denies blood thinners    Anesthesia Other Findings   Reproductive/Obstetrics                              Anesthesia Physical Anesthesia Plan  ASA: 3  Anesthesia Plan: MAC and Spinal   Post-op Pain Management:    Induction: Intravenous  PONV Risk Score and Plan: 2 and  Treatment may vary due to age or medical condition and Propofol  infusion  Airway Management Planned: Nasal Cannula, Natural Airway and Simple Face Mask  Additional Equipment: None  Intra-op Plan:   Post-operative Plan:   Informed Consent: I have reviewed the patients History and Physical, chart, labs and discussed the procedure including the risks, benefits and alternatives for the proposed anesthesia with the patient or authorized representative who has indicated his/her understanding and acceptance.     Dental advisory given  Plan Discussed with: CRNA  Anesthesia Plan Comments: (See PAT note from 9/2)         Anesthesia Quick Evaluation

## 2024-02-24 ENCOUNTER — Inpatient Hospital Stay (HOSPITAL_COMMUNITY)
Admission: RE | Admit: 2024-02-24 | Discharge: 2024-02-25 | DRG: 467 | Disposition: A | Discharge status: Home / Self Care | Attending: Orthopedic Surgery | Admitting: Orthopedic Surgery

## 2024-02-24 ENCOUNTER — Inpatient Hospital Stay (HOSPITAL_COMMUNITY): Payer: Self-pay | Admitting: Physician Assistant

## 2024-02-24 ENCOUNTER — Inpatient Hospital Stay (HOSPITAL_COMMUNITY): Payer: Self-pay | Admitting: Anesthesiology

## 2024-02-24 ENCOUNTER — Encounter (HOSPITAL_COMMUNITY)
Admission: RE | Disposition: A | Discharge status: Home / Self Care | Payer: Self-pay | Source: Home / Self Care | Attending: Orthopedic Surgery

## 2024-02-24 ENCOUNTER — Other Ambulatory Visit: Payer: Self-pay

## 2024-02-24 ENCOUNTER — Inpatient Hospital Stay (HOSPITAL_COMMUNITY)

## 2024-02-24 ENCOUNTER — Encounter (HOSPITAL_COMMUNITY): Payer: Self-pay | Admitting: Orthopedic Surgery

## 2024-02-24 DIAGNOSIS — Z8542 Personal history of malignant neoplasm of other parts of uterus: Secondary | ICD-10-CM | POA: Diagnosis not present

## 2024-02-24 DIAGNOSIS — I2510 Atherosclerotic heart disease of native coronary artery without angina pectoris: Secondary | ICD-10-CM

## 2024-02-24 DIAGNOSIS — M1612 Unilateral primary osteoarthritis, left hip: Secondary | ICD-10-CM | POA: Diagnosis not present

## 2024-02-24 DIAGNOSIS — M069 Rheumatoid arthritis, unspecified: Secondary | ICD-10-CM | POA: Diagnosis present

## 2024-02-24 DIAGNOSIS — Z885 Allergy status to narcotic agent status: Secondary | ICD-10-CM

## 2024-02-24 DIAGNOSIS — I252 Old myocardial infarction: Secondary | ICD-10-CM

## 2024-02-24 DIAGNOSIS — I739 Peripheral vascular disease, unspecified: Secondary | ICD-10-CM | POA: Diagnosis present

## 2024-02-24 DIAGNOSIS — J449 Chronic obstructive pulmonary disease, unspecified: Secondary | ICD-10-CM | POA: Diagnosis present

## 2024-02-24 DIAGNOSIS — Z8661 Personal history of infections of the central nervous system: Secondary | ICD-10-CM

## 2024-02-24 DIAGNOSIS — Z886 Allergy status to analgesic agent status: Secondary | ICD-10-CM

## 2024-02-24 DIAGNOSIS — Z955 Presence of coronary angioplasty implant and graft: Secondary | ICD-10-CM | POA: Diagnosis not present

## 2024-02-24 DIAGNOSIS — Z8616 Personal history of COVID-19: Secondary | ICD-10-CM

## 2024-02-24 DIAGNOSIS — Z981 Arthrodesis status: Secondary | ICD-10-CM

## 2024-02-24 DIAGNOSIS — Y792 Prosthetic and other implants, materials and accessory orthopedic devices associated with adverse incidents: Secondary | ICD-10-CM | POA: Diagnosis present

## 2024-02-24 DIAGNOSIS — Z8249 Family history of ischemic heart disease and other diseases of the circulatory system: Secondary | ICD-10-CM

## 2024-02-24 DIAGNOSIS — E2740 Unspecified adrenocortical insufficiency: Secondary | ICD-10-CM | POA: Diagnosis present

## 2024-02-24 DIAGNOSIS — Z7952 Long term (current) use of systemic steroids: Secondary | ICD-10-CM

## 2024-02-24 DIAGNOSIS — T84091A Other mechanical complication of internal left hip prosthesis, initial encounter: Secondary | ICD-10-CM | POA: Diagnosis not present

## 2024-02-24 DIAGNOSIS — Z9049 Acquired absence of other specified parts of digestive tract: Secondary | ICD-10-CM

## 2024-02-24 DIAGNOSIS — Z87891 Personal history of nicotine dependence: Secondary | ICD-10-CM | POA: Diagnosis not present

## 2024-02-24 DIAGNOSIS — K219 Gastro-esophageal reflux disease without esophagitis: Secondary | ICD-10-CM | POA: Diagnosis not present

## 2024-02-24 DIAGNOSIS — M810 Age-related osteoporosis without current pathological fracture: Secondary | ICD-10-CM | POA: Diagnosis present

## 2024-02-24 DIAGNOSIS — Z96643 Presence of artificial hip joint, bilateral: Secondary | ICD-10-CM | POA: Diagnosis present

## 2024-02-24 DIAGNOSIS — T84011A Broken internal left hip prosthesis, initial encounter: Secondary | ICD-10-CM | POA: Diagnosis not present

## 2024-02-24 DIAGNOSIS — Z79899 Other long term (current) drug therapy: Secondary | ICD-10-CM | POA: Diagnosis not present

## 2024-02-24 DIAGNOSIS — Z96642 Presence of left artificial hip joint: Principal | ICD-10-CM | POA: Diagnosis present

## 2024-02-24 DIAGNOSIS — E7800 Pure hypercholesterolemia, unspecified: Secondary | ICD-10-CM | POA: Diagnosis present

## 2024-02-24 DIAGNOSIS — E538 Deficiency of other specified B group vitamins: Secondary | ICD-10-CM | POA: Diagnosis present

## 2024-02-24 DIAGNOSIS — Z825 Family history of asthma and other chronic lower respiratory diseases: Secondary | ICD-10-CM

## 2024-02-24 DIAGNOSIS — Z7982 Long term (current) use of aspirin: Secondary | ICD-10-CM | POA: Diagnosis not present

## 2024-02-24 DIAGNOSIS — Z888 Allergy status to other drugs, medicaments and biological substances status: Secondary | ICD-10-CM

## 2024-02-24 DIAGNOSIS — Z90710 Acquired absence of both cervix and uterus: Secondary | ICD-10-CM

## 2024-02-24 HISTORY — PX: TOTAL HIP REVISION: SHX763

## 2024-02-24 LAB — TYPE AND SCREEN
ABO/RH(D): O POS
Antibody Screen: NEGATIVE

## 2024-02-24 SURGERY — TOTAL HIP REVISION
Anesthesia: Monitor Anesthesia Care | Site: Hip | Laterality: Left

## 2024-02-24 MED ORDER — METHOCARBAMOL 500 MG PO TABS
500.0000 mg | ORAL_TABLET | Freq: Four times a day (QID) | ORAL | Status: DC | PRN
  Administered 2024-02-24: 500 mg via ORAL
  Filled 2024-02-24: qty 1

## 2024-02-24 MED ORDER — PROPOFOL 500 MG/50ML IV EMUL
INTRAVENOUS | Status: DC | PRN
  Administered 2024-02-24: 75 ug/kg/min via INTRAVENOUS

## 2024-02-24 MED ORDER — ALUM & MAG HYDROXIDE-SIMETH 200-200-20 MG/5ML PO SUSP
30.0000 mL | ORAL | Status: DC | PRN
  Administered 2024-02-25: 30 mL via ORAL
  Filled 2024-02-24: qty 30

## 2024-02-24 MED ORDER — FENTANYL CITRATE (PF) 100 MCG/2ML IJ SOLN
INTRAMUSCULAR | Status: DC | PRN
  Administered 2024-02-24: 100 ug via INTRAVENOUS

## 2024-02-24 MED ORDER — BISACODYL 10 MG RE SUPP: 10.0000 mg | Freq: Every day | RECTAL | Status: DC | PRN

## 2024-02-24 MED ORDER — MUPIROCIN 2 % EX OINT: 1.0000 | TOPICAL_OINTMENT | Freq: Two times a day (BID) | CUTANEOUS | 0 refills | Status: AC

## 2024-02-24 MED ORDER — CHLORHEXIDINE GLUCONATE 0.12 % MT SOLN: 15.0000 mL | Freq: Once | OROMUCOSAL | Status: DC

## 2024-02-24 MED ORDER — ONDANSETRON HCL 4 MG/2ML IJ SOLN
INTRAMUSCULAR | Status: DC | PRN
  Administered 2024-02-24: 4 mg via INTRAVENOUS

## 2024-02-24 MED ORDER — SODIUM CHLORIDE 0.9 % IV SOLN
INTRAVENOUS | Status: DC
Start: 2024-02-24 — End: 2024-02-25

## 2024-02-24 MED ORDER — POTASSIUM CHLORIDE CRYS ER 10 MEQ PO TBCR: 10.0000 meq | EXTENDED_RELEASE_TABLET | Freq: Every day | ORAL | Status: DC | PRN

## 2024-02-24 MED ORDER — METHOCARBAMOL 1000 MG/10ML IJ SOLN: 500.0000 mg | Freq: Four times a day (QID) | INTRAMUSCULAR | Status: DC | PRN

## 2024-02-24 MED ORDER — VANCOMYCIN HCL 1000 MG IV SOLR
INTRAVENOUS | Status: AC
  Filled 2024-02-24: qty 20

## 2024-02-24 MED ORDER — TRANEXAMIC ACID-NACL 1000-0.7 MG/100ML-% IV SOLN
1000.0000 mg | INTRAVENOUS | Status: AC
Start: 2024-02-24 — End: 2024-02-24
  Administered 2024-02-24: 1000 mg via INTRAVENOUS
  Filled 2024-02-24: qty 100

## 2024-02-24 MED ORDER — ACETAMINOPHEN 10 MG/ML IV SOLN
1000.0000 mg | Freq: Once | INTRAVENOUS | Status: DC | PRN
  Administered 2024-02-24: 1000 mg via INTRAVENOUS

## 2024-02-24 MED ORDER — ACETAMINOPHEN 10 MG/ML IV SOLN
INTRAVENOUS | Status: AC
  Filled 2024-02-24: qty 100

## 2024-02-24 MED ORDER — ONDANSETRON HCL 4 MG/2ML IJ SOLN
INTRAMUSCULAR | Status: AC
  Filled 2024-02-24: qty 2

## 2024-02-24 MED ORDER — LIDOCAINE HCL (PF) 2 % IJ SOLN
INTRAMUSCULAR | Status: DC | PRN
  Administered 2024-02-24: 60 mg via INTRADERMAL

## 2024-02-24 MED ORDER — MORPHINE SULFATE (PF) 2 MG/ML IV SOLN
0.5000 mg | INTRAVENOUS | Status: DC | PRN
  Administered 2024-02-24 (×3): 1 mg via INTRAVENOUS
  Filled 2024-02-24 (×3): qty 1

## 2024-02-24 MED ORDER — LACTATED RINGERS IV SOLN: INTRAVENOUS | Status: DC | PRN

## 2024-02-24 MED ORDER — NITROGLYCERIN 0.4 MG SL SUBL: 0.4000 mg | SUBLINGUAL_TABLET | SUBLINGUAL | Status: DC | PRN

## 2024-02-24 MED ORDER — PROPOFOL 10 MG/ML IV BOLUS
INTRAVENOUS | Status: DC | PRN
Start: 2024-02-24 — End: 2024-02-24
  Administered 2024-02-24 (×2): 30 mg via INTRAVENOUS
  Administered 2024-02-24: 20 mg via INTRAVENOUS

## 2024-02-24 MED ORDER — DOCUSATE SODIUM 100 MG PO CAPS
100.0000 mg | ORAL_CAPSULE | Freq: Two times a day (BID) | ORAL | Status: DC
  Administered 2024-02-24 – 2024-02-25 (×2): 100 mg via ORAL
  Filled 2024-02-24 (×2): qty 1

## 2024-02-24 MED ORDER — ORAL CARE MOUTH RINSE: 15.0000 mL | Freq: Once | OROMUCOSAL | Status: DC

## 2024-02-24 MED ORDER — ONDANSETRON HCL 4 MG PO TABS: 4.0000 mg | ORAL_TABLET | Freq: Four times a day (QID) | ORAL | Status: DC | PRN

## 2024-02-24 MED ORDER — VANCOMYCIN HCL 1000 MG IV SOLR
INTRAVENOUS | Status: DC | PRN
  Administered 2024-02-24: 1000 mg via TOPICAL

## 2024-02-24 MED ORDER — POVIDONE-IODINE 10 % EX SWAB
2.0000 | Freq: Once | CUTANEOUS | Status: DC
Start: 2024-02-24 — End: 2024-02-24

## 2024-02-24 MED ORDER — LACTATED RINGERS IV SOLN: INTRAVENOUS | Status: DC

## 2024-02-24 MED ORDER — ACETAMINOPHEN 325 MG PO TABS: 325.0000 mg | ORAL_TABLET | Freq: Four times a day (QID) | ORAL | Status: DC | PRN

## 2024-02-24 MED ORDER — FUROSEMIDE 20 MG PO TABS: 20.0000 mg | ORAL_TABLET | Freq: Every day | ORAL | Status: DC | PRN

## 2024-02-24 MED ORDER — PROPOFOL 500 MG/50ML IV EMUL
INTRAVENOUS | Status: AC
  Filled 2024-02-24: qty 50

## 2024-02-24 MED ORDER — MENTHOL 3 MG MT LOZG: 1.0000 | LOZENGE | OROMUCOSAL | Status: DC | PRN

## 2024-02-24 MED ORDER — BUPIVACAINE IN DEXTROSE 0.75-8.25 % IT SOLN
INTRATHECAL | Status: DC | PRN
  Administered 2024-02-24: 1.7 mL via INTRATHECAL

## 2024-02-24 MED ORDER — METOCLOPRAMIDE HCL 5 MG PO TABS: 5.0000 mg | ORAL_TABLET | Freq: Three times a day (TID) | ORAL | Status: DC | PRN

## 2024-02-24 MED ORDER — ACETAMINOPHEN 500 MG PO TABS
1000.0000 mg | ORAL_TABLET | Freq: Once | ORAL | Status: AC
  Administered 2024-02-24: 1000 mg via ORAL
  Filled 2024-02-24: qty 2

## 2024-02-24 MED ORDER — PHENYLEPHRINE HCL-NACL 20-0.9 MG/250ML-% IV SOLN
INTRAVENOUS | Status: DC | PRN
  Administered 2024-02-24: 25 ug/min via INTRAVENOUS

## 2024-02-24 MED ORDER — PHENOL 1.4 % MT LIQD: 1.0000 | OROMUCOSAL | Status: DC | PRN

## 2024-02-24 MED ORDER — CEFAZOLIN SODIUM-DEXTROSE 2-4 GM/100ML-% IV SOLN
2.0000 g | INTRAVENOUS | Status: AC
  Administered 2024-02-24: 2 g via INTRAVENOUS
  Filled 2024-02-24: qty 100

## 2024-02-24 MED ORDER — PHENYLEPHRINE HCL-NACL 20-0.9 MG/250ML-% IV SOLN
INTRAVENOUS | Status: AC
  Filled 2024-02-24: qty 250

## 2024-02-24 MED ORDER — DEXAMETHASONE SODIUM PHOSPHATE 10 MG/ML IJ SOLN
INTRAMUSCULAR | Status: DC | PRN
  Administered 2024-02-24: 5 mg via INTRAVENOUS

## 2024-02-24 MED ORDER — ISOPROPYL ALCOHOL 70 % SOLN
Status: DC | PRN
  Administered 2024-02-24: 1 via TOPICAL

## 2024-02-24 MED ORDER — PANTOPRAZOLE SODIUM 40 MG PO TBEC
40.0000 mg | DELAYED_RELEASE_TABLET | Freq: Every day | ORAL | Status: DC
  Administered 2024-02-25: 40 mg via ORAL
  Filled 2024-02-24: qty 1

## 2024-02-24 MED ORDER — OXYCODONE HCL 5 MG/5ML PO SOLN: 5.0000 mg | Freq: Once | ORAL | Status: DC | PRN

## 2024-02-24 MED ORDER — OXYCODONE HCL 5 MG PO TABS: 5.0000 mg | ORAL_TABLET | Freq: Once | ORAL | Status: DC | PRN

## 2024-02-24 MED ORDER — FENTANYL CITRATE PF 50 MCG/ML IJ SOSY
PREFILLED_SYRINGE | INTRAMUSCULAR | Status: AC
  Filled 2024-02-24: qty 2

## 2024-02-24 MED ORDER — DEXAMETHASONE SODIUM PHOSPHATE 10 MG/ML IJ SOLN
INTRAMUSCULAR | Status: AC
  Filled 2024-02-24: qty 1

## 2024-02-24 MED ORDER — SODIUM CHLORIDE 0.9 % IR SOLN
Status: DC | PRN
  Administered 2024-02-24: 3000 mL

## 2024-02-24 MED ORDER — CEFAZOLIN SODIUM-DEXTROSE 2-4 GM/100ML-% IV SOLN
2.0000 g | Freq: Four times a day (QID) | INTRAVENOUS | Status: AC
  Administered 2024-02-24 – 2024-02-25 (×2): 2 g via INTRAVENOUS
  Filled 2024-02-24 (×2): qty 100

## 2024-02-24 MED ORDER — POVIDONE-IODINE 10 % EX SWAB: 2.0000 | Freq: Once | CUTANEOUS | Status: DC

## 2024-02-24 MED ORDER — HYDROCODONE-ACETAMINOPHEN 5-325 MG PO TABS: 1.0000 | ORAL_TABLET | ORAL | Status: DC | PRN

## 2024-02-24 MED ORDER — HYDROCORTISONE SOD SUC (PF) 100 MG IJ SOLR
50.0000 mg | Freq: Two times a day (BID) | INTRAMUSCULAR | Status: AC
  Administered 2024-02-24 – 2024-02-25 (×2): 50 mg via INTRAVENOUS
  Filled 2024-02-24 (×3): qty 1

## 2024-02-24 MED ORDER — 0.9 % SODIUM CHLORIDE (POUR BTL) OPTIME
TOPICAL | Status: DC | PRN
  Administered 2024-02-24: 1000 mL

## 2024-02-24 MED ORDER — FENTANYL CITRATE PF 50 MCG/ML IJ SOSY
25.0000 ug | PREFILLED_SYRINGE | INTRAMUSCULAR | Status: DC | PRN
  Administered 2024-02-24 (×2): 50 ug via INTRAVENOUS

## 2024-02-24 MED ORDER — PROPOFOL 500 MG/50ML IV EMUL
INTRAVENOUS | Status: AC
Start: 2024-02-24 — End: 2024-02-24
  Filled 2024-02-24: qty 50

## 2024-02-24 MED ORDER — CHLORHEXIDINE GLUCONATE 4 % EX SOLN: 1.0000 | CUTANEOUS | 1 refills | Status: AC

## 2024-02-24 MED ORDER — ASPIRIN 81 MG PO TBEC
81.0000 mg | DELAYED_RELEASE_TABLET | Freq: Two times a day (BID) | ORAL | Status: DC
  Administered 2024-02-25: 81 mg via ORAL
  Filled 2024-02-24: qty 1

## 2024-02-24 MED ORDER — ONDANSETRON HCL 4 MG/2ML IJ SOLN: 4.0000 mg | Freq: Four times a day (QID) | INTRAMUSCULAR | Status: DC | PRN

## 2024-02-24 MED ORDER — FENTANYL CITRATE (PF) 100 MCG/2ML IJ SOLN
INTRAMUSCULAR | Status: AC
  Filled 2024-02-24: qty 2

## 2024-02-24 MED ORDER — LIDOCAINE HCL (PF) 2 % IJ SOLN
INTRAMUSCULAR | Status: AC
  Filled 2024-02-24: qty 5

## 2024-02-24 MED ORDER — ZOLPIDEM TARTRATE 5 MG PO TABS
5.0000 mg | ORAL_TABLET | Freq: Every evening | ORAL | Status: DC | PRN
  Administered 2024-02-24: 5 mg via ORAL
  Filled 2024-02-24: qty 1

## 2024-02-24 MED ORDER — VITAMIN D (ERGOCALCIFEROL) 1.25 MG (50000 UNIT) PO CAPS: 50000.0000 [IU] | ORAL_CAPSULE | ORAL | Status: DC

## 2024-02-24 MED ORDER — STERILE WATER FOR IRRIGATION IR SOLN
Status: DC | PRN
  Administered 2024-02-24: 2000 mL

## 2024-02-24 MED ORDER — HYDROCODONE-ACETAMINOPHEN 7.5-325 MG PO TABS
1.0000 | ORAL_TABLET | ORAL | Status: DC | PRN
  Administered 2024-02-24 – 2024-02-25 (×3): 2 via ORAL
  Filled 2024-02-24 (×3): qty 2

## 2024-02-24 MED ORDER — METOCLOPRAMIDE HCL 5 MG/ML IJ SOLN: 5.0000 mg | Freq: Three times a day (TID) | INTRAMUSCULAR | Status: DC | PRN

## 2024-02-24 MED ORDER — POLYETHYLENE GLYCOL 3350 17 G PO PACK: 17.0000 g | PACK | Freq: Every day | ORAL | Status: DC | PRN

## 2024-02-24 MED ORDER — DIPHENHYDRAMINE HCL 12.5 MG/5ML PO ELIX: 12.5000 mg | ORAL_SOLUTION | ORAL | Status: DC | PRN

## 2024-02-24 SURGICAL SUPPLY — 70 items
BAG COUNTER SPONGE SURGICOUNT (BAG) IMPLANT
BAG ZIPLOCK 12X15 (MISCELLANEOUS) ×2 IMPLANT
BIT DRILL RINGLOC QUICK CONN (BIT) IMPLANT
BLADE SAGITTAL 25.0X1.37X90 (BLADE) IMPLANT
BLADE SAW SGTL 18X1.27X75 (BLADE) IMPLANT
BNDG COHESIVE 6X5 TAN ST LF (GAUZE/BANDAGES/DRESSINGS) ×2 IMPLANT
BRUSH FEMORAL CANAL (MISCELLANEOUS) IMPLANT
CHLORAPREP W/TINT 26 (MISCELLANEOUS) ×2 IMPLANT
COVER MAYO STAND XLG (MISCELLANEOUS) ×2 IMPLANT
COVER SURGICAL LIGHT HANDLE (MISCELLANEOUS) ×2 IMPLANT
DERMABOND ADVANCED .7 DNX12 (GAUZE/BANDAGES/DRESSINGS) ×4 IMPLANT
DRAPE C-ARM 42X120 X-RAY (DRAPES) ×2 IMPLANT
DRAPE C-ARMOR (DRAPES) ×2 IMPLANT
DRAPE HIP W/POCKET STRL (MISCELLANEOUS) ×2 IMPLANT
DRAPE IMP U-DRAPE 54X76 (DRAPES) ×4 IMPLANT
DRAPE INCISE IOBAN 66X45 STRL (DRAPES) ×4 IMPLANT
DRAPE INCISE IOBAN 85X60 (DRAPES) ×2 IMPLANT
DRAPE POUCH INSTRU U-SHP 10X18 (DRAPES) ×2 IMPLANT
DRAPE SHEET LG 3/4 BI-LAMINATE (DRAPES) ×6 IMPLANT
DRAPE STERI IOBAN 125X83 (DRAPES) ×2 IMPLANT
DRAPE SURG 17X11 SM STRL (DRAPES) ×2 IMPLANT
DRAPE U-SHAPE 47X51 STRL (DRAPES) ×4 IMPLANT
DRESSING PREVENA PLUS CUSTOM (GAUZE/BANDAGES/DRESSINGS) IMPLANT
DRSG AQUACEL AG ADV 3.5X10 (GAUZE/BANDAGES/DRESSINGS) ×2 IMPLANT
DRSG AQUACEL AG ADV 3.5X14 (GAUZE/BANDAGES/DRESSINGS) ×2 IMPLANT
DRSG IV TEGADERM 3.5X4.5 STRL (GAUZE/BANDAGES/DRESSINGS) IMPLANT
ELECT BLADE TIP CTD 4 INCH (ELECTRODE) ×2 IMPLANT
ELECT REM PT RETURN 15FT ADLT (MISCELLANEOUS) ×2 IMPLANT
EVACUATOR DRAINAGE 10X20 100CC (DRAIN) IMPLANT
GAUZE SPONGE 2X2 8PLY STRL LF (GAUZE/BANDAGES/DRESSINGS) IMPLANT
GAUZE SPONGE 4X4 12PLY STRL (GAUZE/BANDAGES/DRESSINGS) ×2 IMPLANT
GLOVE BIO SURGEON STRL SZ7 (GLOVE) ×4 IMPLANT
GLOVE BIO SURGEON STRL SZ8.5 (GLOVE) ×4 IMPLANT
GLOVE BIOGEL PI IND STRL 7.5 (GLOVE) ×2 IMPLANT
GLOVE BIOGEL PI IND STRL 8.5 (GLOVE) ×2 IMPLANT
GOWN SPEC L3 XXLG W/TWL (GOWN DISPOSABLE) ×2 IMPLANT
GOWN STRL REUS W/ TWL XL LVL3 (GOWN DISPOSABLE) ×2 IMPLANT
HEAD FEM -6XOFST 36XMDLR (Orthopedic Implant) IMPLANT
HOOD PEEL AWAY T7 (MISCELLANEOUS) ×8 IMPLANT
KIT BASIN OR (CUSTOM PROCEDURE TRAY) ×2 IMPLANT
KIT TURNOVER KIT A (KITS) ×2 IMPLANT
LINER G7 VIT E +5 36 D (Liner) IMPLANT
MANIFOLD NEPTUNE II (INSTRUMENTS) ×2 IMPLANT
MARKER SKIN DUAL TIP RULER LAB (MISCELLANEOUS) ×2 IMPLANT
NDL SAFETY ECLIPSE 18X1.5 (NEEDLE) ×2 IMPLANT
NDL SPNL 18GX3.5 QUINCKE PK (NEEDLE) ×2 IMPLANT
NEEDLE SPNL 18GX3.5 QUINCKE PK (NEEDLE) ×1 IMPLANT
NS IRRIG 1000ML POUR BTL (IV SOLUTION) ×2 IMPLANT
PACK TOTAL JOINT (CUSTOM PROCEDURE TRAY) ×2 IMPLANT
PENCIL SMOKE EVACUATOR (MISCELLANEOUS) ×2 IMPLANT
PROTECTOR NERVE ULNAR (MISCELLANEOUS) ×4 IMPLANT
SEALER BIPOLAR AQUA 6.0 (INSTRUMENTS) ×2 IMPLANT
SET HNDPC FAN SPRY TIP SCT (DISPOSABLE) ×2 IMPLANT
SHIELD FACE FULL FLUID (MISCELLANEOUS) ×2 IMPLANT
SOLUTION PRONTOSAN WOUND 350ML (IRRIGATION / IRRIGATOR) ×2 IMPLANT
SPONGE T-LAP 18X18 ~~LOC~~+RFID (SPONGE) IMPLANT
STAPLER SKIN PROX 35W (STAPLE) ×2 IMPLANT
SUT MNCRL AB 3-0 PS2 18 (SUTURE) IMPLANT
SUT MON AB 2-0 CT1 36 (SUTURE) ×4 IMPLANT
SUT NYLON 3 0 (SUTURE) IMPLANT
SUT STRATAFIX 14 PDO 48 VLT (SUTURE) ×2 IMPLANT
SUT VIC AB 1 CT1 36 (SUTURE) ×4 IMPLANT
SUT VIC AB 1 CTX36XBRD ANBCTR (SUTURE) ×2 IMPLANT
SUT VIC AB 2-0 CT1 TAPERPNT 27 (SUTURE) ×2 IMPLANT
TOWEL GREEN STERILE FF (TOWEL DISPOSABLE) ×2 IMPLANT
TOWEL OR 17X26 10 PK STRL BLUE (TOWEL DISPOSABLE) ×6 IMPLANT
TOWER CARTRIDGE SMART MIX (DISPOSABLE) IMPLANT
TRAY FOLEY MTR SLVR 16FR STAT (SET/KITS/TRAYS/PACK) ×2 IMPLANT
TUBE SUCTION HIGH CAP CLEAR NV (SUCTIONS) ×2 IMPLANT
WATER STERILE IRR 1000ML POUR (IV SOLUTION) ×4 IMPLANT

## 2024-02-24 NOTE — Transfer of Care (Signed)
 Immediate Anesthesia Transfer of Care Note  Patient: Debra Farmer  Procedure(s) Performed: TOTAL HIP REVISION (Left: Hip)  Patient Location: PACU  Anesthesia Type:Spinal  Level of Consciousness: awake and patient cooperative  Airway & Oxygen Therapy: Patient Spontanous Breathing and Patient connected to face mask oxygen  Post-op Assessment: Report given to RN and Post -op Vital signs reviewed and stable  Post vital signs: Reviewed and stable  Last Vitals:  Vitals Value Taken Time  BP 130/56 02/24/24 16:20  Temp    Pulse 61 02/24/24 16:22  Resp 16 02/24/24 16:22  SpO2 100 % 02/24/24 16:22  Vitals shown include unfiled device data.  Last Pain:  Vitals:   02/24/24 1024  TempSrc:   PainSc: 0-No pain         Complications: No notable events documented.

## 2024-02-24 NOTE — Plan of Care (Signed)
  Problem: Health Behavior/Discharge Planning: Goal: Ability to manage health-related needs will improve Outcome: Progressing   Problem: Clinical Measurements: Goal: Ability to maintain clinical measurements within normal limits will improve Outcome: Progressing   Problem: Activity: Goal: Risk for activity intolerance will decrease Outcome: Progressing   Problem: Pain Managment: Goal: General experience of comfort will improve and/or be controlled Outcome: Progressing   Problem: Safety: Goal: Ability to remain free from injury will improve Outcome: Progressing   Problem: Skin Integrity: Goal: Risk for impaired skin integrity will decrease Outcome: Progressing

## 2024-02-24 NOTE — Discharge Instructions (Signed)
 Dr. Redell Shoals Joint Replacement Specialist [facility], [place] [address] [place], [place] [address] [phone]   TOTAL HIP REPLACEMENT POSTOPERATIVE DIRECTIONS    Hip Rehabilitation, Guidelines Following Surgery   WEIGHT BEARING Weight bearing as tolerated with assist device (walker, cane, etc) as directed, use it as long as suggested by your surgeon or therapist, typically at least 4-6 weeks., posterior hip precautions.   The results of a hip operation are greatly improved after range of motion and muscle strengthening exercises. Follow all safety measures which are given to protect your hip. If any of these exercises cause increased pain or swelling in your joint, decrease the amount until you are comfortable again. Then slowly increase the exercises. Call your caregiver if you have problems or questions.   HOME CARE INSTRUCTIONS  Most of the following instructions are designed to prevent the dislocation of your new hip.  Remove items at home which could result in a fall. This includes throw rugs or furniture in walking pathways.  Continue medications as instructed at time of discharge. You may have some home medications which will be placed on hold until you complete the course of blood thinner medication. Keep dressing clean and dry.  Do not put on socks or shoes without following the instructions of your caregivers.   Sit on chairs with arms. Use the chair arms to help push yourself up when arising.  Arrange for the use of a toilet seat elevator so you are not sitting low.  Walk with walker as instructed.  You may resume a sexual relationship in one month or when given the OK by your caregiver.  Use walker as long as suggested by your caregivers.  You may put full weight on your legs and walk as much as is comfortable. Avoid periods of inactivity such as sitting longer than an hour when not asleep. This helps prevent blood clots.  You may return to  work once you are cleared by Designer, industrial/product.  Do not drive a car for 6 weeks or until released by your surgeon.  Do not drive while taking narcotics.  Wear elastic stockings for two weeks following surgery during the day but you may remove then at night.  Make sure you keep all of your appointments after your operation with all of your doctors and caregivers. You should call the office at the above phone number and make an appointment for approximately two weeks after the date of your surgery. Please pick up a stool softener and laxative for home use as long as you are requiring pain medications. ICE to the affected hip every three hours for 30 minutes at a time and then as needed for pain and swelling. Continue to use ice on the hip for pain and swelling from surgery. You may notice swelling that will progress down to the foot and ankle.  This is normal after surgery.  Elevate the leg when you are not up walking on it.   It is important for you to complete the blood thinner medication as prescribed by your doctor. Continue to use the breathing machine which will help keep your temperature down.  It is common for your temperature to cycle up and down following surgery, especially at night when you are not up moving around and exerting yourself.  The breathing machine keeps your lungs expanded and your temperature down.  RANGE OF MOTION AND STRENGTHENING EXERCISES  These exercises are designed to help you keep full movement of  your hip joint. Follow your caregiver's or physical therapist's instructions. Perform all exercises about fifteen times, three times per day or as directed. Exercise both hips, even if you have had only one joint replacement. These exercises can be done on a training (exercise) mat, on the floor, on a table or on a bed. Use whatever works the best and is most comfortable for you. Use music or television while you are exercising so that the exercises are a pleasant break in your day.  This will make your life better with the exercises acting as a break in routine you can look forward to.  Lying on your back, slowly slide your foot toward your buttocks, raising your knee up off the floor. Then slowly slide your foot back down until your leg is straight again.  Lying on your back spread your legs as far apart as you can without causing discomfort.  Lying on your side, raise your upper leg and foot straight up from the floor as far as is comfortable. Slowly lower the leg and repeat.  Lying on your back, tighten up the muscle in the front of your thigh (quadriceps muscles). You can do this by keeping your leg straight and trying to raise your heel off the floor. This helps strengthen the largest muscle supporting your knee.  Lying on your back, tighten up the muscles of your buttocks both with the legs straight and with the knee bent at a comfortable angle while keeping your heel on the floor.   SKILLED REHAB INSTRUCTIONS: If the patient is transferred to a skilled rehab facility following release from the hospital, a list of the current medications will be sent to the facility for the patient to continue.  When discharged from the skilled rehab facility, please have the facility set up the patient's Home Health Physical Therapy prior to being released. Also, the skilled facility will be responsible for providing the patient with their medications at time of release from the facility to include their pain medication and their blood thinner medication. If the patient is still at the rehab facility at time of the two week follow up appointment, the skilled rehab facility will also need to assist the patient in arranging follow up appointment in our office and any transportation needs.  POST-OPERATIVE OPIOID TAPER INSTRUCTIONS: It is important to wean off of your opioid medication as soon as possible. If you do not need pain medication after your surgery it is ok to stop day one. Opioids  include: Codeine, Hydrocodone (Norco, Vicodin), Oxycodone (Percocet, oxycontin ) and hydromorphone  amongst others.  Long term and even short term use of opiods can cause: Increased pain response Dependence Constipation Depression Respiratory depression And more.  Withdrawal symptoms can include Flu like symptoms Nausea, vomiting And more Techniques to manage these symptoms Hydrate well Eat regular healthy meals Stay active Use relaxation techniques(deep breathing, meditating, yoga) Do Not substitute Alcohol  to help with tapering If you have been on opioids for less than two weeks and do not have pain than it is ok to stop all together.  Plan to wean off of opioids This plan should start within one week post op of your joint replacement. Maintain the same interval or time between taking each dose and first decrease the dose.  Cut the total daily intake of opioids by one tablet each day Next start to increase the time between doses. The last dose that should be eliminated is the evening dose.    MAKE SURE YOU:  Understand  these instructions.  Will watch your condition.  Will get help right away if you are not doing well or get worse.  Pick up stool softner and laxative for home use following surgery while on pain medications. Do not remove your dressing. Keep dressing clean and dry.  Do not get your incision wet.  Continue to use ice for pain and swelling after surgery. Do not use any lotions or creams on the incision until instructed by your surgeon. Posterior Hip Precautions.  Please charge your Prevena Portable Wound Vac.  Please empty your JP drain every 4 hours and record the output.  Keep a log of our JP drain output and bring to your first appointment.  Follow-up next Friday 03/02/24 for removal of your Prevena negative pressure dressing.

## 2024-02-24 NOTE — Anesthesia Procedure Notes (Signed)
 Spinal  Patient location during procedure: OR Start time: 02/24/2024 1:34 PM End time: 02/24/2024 1:41 PM Reason for block: surgical anesthesia Staffing Performed: resident/CRNA  Resident/CRNA: Dasie Nena PARAS, CRNA Performed by: Dasie Nena PARAS, CRNA Authorized by: Leopoldo Bruckner, MD   Preanesthetic Checklist Completed: patient identified, IV checked, site marked, risks and benefits discussed, surgical consent, monitors and equipment checked, pre-op evaluation and timeout performed Spinal Block Patient position: sitting Prep: DuraPrep and site prepped and draped Patient monitoring: heart rate, continuous pulse ox, blood pressure and cardiac monitor Approach: midline Location: L3-4 Injection technique: single-shot Needle Needle type: Pencan  Needle gauge: 24 G Needle length: 10 cm Assessment Events: CSF return Additional Notes Expiration date on kit noted and within range.  Lot # [id number]   Sterile prep and drape.    Local with Lidocaine  1%.  Good CSF flow noted with no heme or c/o paresthesia.   Patient tolerated well.

## 2024-02-24 NOTE — Op Note (Signed)
 OPERATIVE REPORT   02/24/2024  4:12 PM  PATIENT:  Debra Farmer   SURGEON:  Redell JINNY Shoals, MD  ASSISTANT:  Valery Potters, PA-C.   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  POSTOPERATIVE DIAGNOSIS:  Same.  PROCEDURE:  Revision left total hip arthroplasty, head ball and liner exchange.  ANESTHESIA:   GETA.  ANTIBIOTICS:  2g Ancef .  EXPLANTS: Biomet metal head ball, 36 - 3 mm. Vivacit-E liner 36 mm, D, neutral.  IMPLANTS:  Vivacit-E liner, 36 mm, D, +5 mm neutral. Biomet metal head ball, 36 + 0 mm.  TUBES AND DRAINS:  10 mm flat JP drain in the subcutaneous tissue. Prevena customizable negative pressure dressing at 75 mmHg.  SPECIMENS:  None.  COMPLICATIONS:  None.  DISPOSITION:  Stable to PACU.  SURGICAL INDICATIONS:  Debra Farmer is a 78 y.o. female who underwent primary left total hip arthroplasty 01/07/2023.  She has had several dislocations while bending over to pick up objects.  She was indicated for head and liner exchange left hip.  We discussed the risk, benefits, and alternatives, and she elected to proceed.  PROCEDURE IN DETAIL: The patient was identified in the holding area using 2 identifiers.  Surgical site was marked by myself.  She was taken to the operating room, and spinal anesthesia was obtained.  She was positioned on the Hana table.  All bony prominences were well-padded.  The hip was prepped and draped in the normal sterile surgical fashion.  Timeout was called, verifying side and site of surgery.  Patient received IV antibiotics within 60 minutes of beginning the procedure.  Previous bikini excision was sharply excised with #10 blade.  I raised full-thickness skin flaps.  Direct anterior approach through the later interval was performed.  Anterior capsule was exposed and an inverted T capsulotomy was performed.  Total hip was identified.  Joint fluid was benign.  No purulence or evidence of infection.  Hip was dislocated  with a bone hook.  Head was removed from the trunnion with a bone tamp.  Trunnion was intact without any damage or corrosion.  Acetabular retractors were placed, and the trunnion was tucked inferiorly.  Scar tissue around the cup was sharply excised with Bovie electrocautery.  Using osteotomes, I was able to remove her liner.  The real 36+5 mm liner was placed.  I placed a trial 36+0 head ball and the hip was reduced.  Soft tissue tension was excellent.  At 90 degrees external rotation and full extension, there was no impingement or instability.  The boot was removed from the Hana table, and I repeated stability testing at 90 degrees of flexion and neutral abduction.  She was stable to 90 degrees of internal rotation without impingement or instability.  The hip was gently dislocated with a bone hook.  The trial head ball was exchanged for the real head ball, and the hip was reduced.  Repeat stability testing was performed and found to be unchanged.  AP pelvis and AP hip fluoroscopy views were obtained.  The wound was copiously irrigated with Prontosan solution followed by normal saline with pulsatile lavage.  1 g vancomycin  powder was placed into the joint.  Fascia was repaired with #1 strata fix.  Through a separate stab incision over the distal lateral thigh, a 10 mm flat JP drain was placed into the subcutaneous tissues.  Subcutaneous fatty tissue was closed with 2-0 Vicryl.  Deep dermal layer was closed with 2-0 Monocryl.  Skin was reapproximated  with staples.  A customizable Prevena negative pressure dressing was placed and hooked up to suction at 75 mmHg.  There was excellent seal without any leak.  The patient was then transferred to the stretcher, awakened from anesthesia, and taken to the PACU in stable condition.  Sponge, needle, instrument counts were correct at the end of the case x 2.  There were no known complications.

## 2024-02-24 NOTE — Anesthesia Procedure Notes (Signed)
 Procedure Name: MAC Date/Time: 02/24/2024 1:34 PM  Performed by: Dasie Nena PARAS, CRNAPre-anesthesia Checklist: Patient identified, Emergency Drugs available, Suction available, Patient being monitored and Timeout performed Oxygen Delivery Method: Simple face mask Placement Confirmation: positive ETCO2

## 2024-02-24 NOTE — Interval H&P Note (Signed)
 History and Physical Interval Note:  02/24/2024 10:49 AM  Debra Farmer  has presented today for surgery, with the diagnosis of Failed Left total hip arthroplasty.  The various methods of treatment have been discussed with the patient and family. After consideration of risks, benefits and other options for treatment, the patient has consented to  Procedure(s): TOTAL HIP REVISION (Left) as a surgical intervention.  The patient's history has been reviewed, patient examined, no change in status, stable for surgery.  I have reviewed the patient's chart and labs.  Questions were answered to the patient's satisfaction.    The risks, benefits, and alternatives were discussed with the patient. There are risks associated with the surgery including, but not limited to, problems with anesthesia (death), infection, instability (giving out of the joint), dislocation, differences in leg length/angulation/rotation, fracture of bones, loosening or failure of implants, hematoma (blood accumulation) which may require surgical drainage, blood clots, pulmonary embolism, nerve injury (foot drop and lateral thigh numbness), and blood vessel injury. The patient understands these risks and elects to proceed.    Debra Farmer

## 2024-02-25 ENCOUNTER — Encounter (HOSPITAL_COMMUNITY): Payer: Self-pay | Admitting: Orthopedic Surgery

## 2024-02-25 LAB — CBC
HCT: 42.3 % (ref 36.0–46.0)
Hemoglobin: 13.3 g/dL (ref 12.0–15.0)
MCH: 28.7 pg (ref 26.0–34.0)
MCHC: 31.4 g/dL (ref 30.0–36.0)
MCV: 91.2 fL (ref 80.0–100.0)
Platelets: 213 K/uL (ref 150–400)
RBC: 4.64 MIL/uL (ref 3.87–5.11)
RDW: 13.7 % (ref 11.5–15.5)
WBC: 11 K/uL — ABNORMAL HIGH (ref 4.0–10.5)
nRBC: 0 % (ref 0.0–0.2)

## 2024-02-25 LAB — BASIC METABOLIC PANEL WITH GFR
Anion gap: 10 (ref 5–15)
BUN: 17 mg/dL (ref 8–23)
CO2: 27 mmol/L (ref 22–32)
Calcium: 8.9 mg/dL (ref 8.9–10.3)
Chloride: 102 mmol/L (ref 98–111)
Creatinine, Ser: 0.93 mg/dL (ref 0.44–1.00)
GFR, Estimated: >60 mL/min (ref >60)
Glucose, Bld: 214 mg/dL — ABNORMAL HIGH (ref 70–99)
Potassium: 4.4 mmol/L (ref 3.5–5.1)
Sodium: 139 mmol/L (ref 135–145)

## 2024-02-25 MED ORDER — DOCUSATE SODIUM 100 MG PO CAPS: 100.0000 mg | ORAL_CAPSULE | Freq: Two times a day (BID) | ORAL | 0 refills | Status: AC

## 2024-02-25 MED ORDER — POLYETHYLENE GLYCOL 3350 17 G PO PACK
17.0000 g | PACK | Freq: Every day | ORAL | 0 refills | Status: AC | PRN
Start: 2024-02-25 — End: 2024-03-26

## 2024-02-25 MED ORDER — ONDANSETRON HCL 4 MG PO TABS: 4.0000 mg | ORAL_TABLET | Freq: Three times a day (TID) | ORAL | 0 refills | Status: AC | PRN

## 2024-02-25 MED ORDER — HYDROCODONE-ACETAMINOPHEN 5-325 MG PO TABS: 1.0000 | ORAL_TABLET | ORAL | 0 refills | Status: AC | PRN

## 2024-02-25 MED ORDER — HYDROMORPHONE HCL 1 MG/ML IJ SOLN
0.5000 mg | INTRAMUSCULAR | Status: DC | PRN
  Administered 2024-02-25: 0.5 mg via INTRAVENOUS
  Filled 2024-02-25: qty 0.5

## 2024-02-25 MED ORDER — ASPIRIN 81 MG PO TBEC: 81.0000 mg | DELAYED_RELEASE_TABLET | Freq: Two times a day (BID) | ORAL | 12 refills | Status: AC

## 2024-02-25 NOTE — Plan of Care (Signed)
  Problem: Education: Goal: Knowledge of General Education information will improve Description: Including pain rating scale, medication(s)/side effects and non-pharmacologic comfort measures Outcome: Progressing   Problem: Health Behavior/Discharge Planning: Goal: Ability to manage health-related needs will improve Outcome: Progressing   Problem: Clinical Measurements: Goal: Ability to maintain clinical measurements within normal limits will improve Outcome: Progressing Goal: Will remain free from infection Outcome: Progressing Goal: Diagnostic test results will improve Outcome: Progressing Goal: Respiratory complications will improve Outcome: Progressing Goal: Cardiovascular complication will be avoided Outcome: Progressing   Problem: Activity: Goal: Risk for activity intolerance will decrease Outcome: Progressing   Problem: Nutrition: Goal: Adequate nutrition will be maintained Outcome: Completed/Met   Problem: Coping: Goal: Level of anxiety will decrease Outcome: Progressing   Problem: Elimination: Goal: Will not experience complications related to bowel motility Outcome: Progressing Goal: Will not experience complications related to urinary retention Outcome: Progressing   Problem: Pain Managment: Goal: General experience of comfort will improve and/or be controlled Outcome: Progressing   Problem: Safety: Goal: Ability to remain free from injury will improve Outcome: Progressing   Problem: Skin Integrity: Goal: Risk for impaired skin integrity will decrease Outcome: Progressing   Problem: Education: Goal: Knowledge of the prescribed therapeutic regimen will improve Outcome: Progressing Goal: Understanding of discharge needs will improve Outcome: Progressing Goal: Individualized Educational Video(s) Outcome: Completed/Met   Problem: Activity: Goal: Ability to avoid complications of mobility impairment will improve Outcome: Progressing Goal: Ability to  tolerate increased activity will improve Outcome: Progressing   Problem: Clinical Measurements: Goal: Postoperative complications will be avoided or minimized Outcome: Progressing   Problem: Pain Management: Goal: Pain level will decrease with appropriate interventions Outcome: Progressing   Problem: Skin Integrity: Goal: Will show signs of wound healing Outcome: Progressing

## 2024-02-25 NOTE — Progress Notes (Signed)
 Patient discharged to home w/ husband. Given all belongings, instructions, equipment. Verbalized understanding of all instructions. Reviewed JP drain care/empty/monitor/measure, prevena care/problem solving, pain control, safety. Patient and husband verbalized understanding, demonstrated skill. Escorted to pov via w/c.

## 2024-02-25 NOTE — TOC Transition Note (Signed)
 Transition of Care [facility]) - Discharge Note   Patient Details  Name: Debra Farmer MRN: [id number] Date of Birth: Apr 20, 1946  Transition [facility]) CM/SW Contact:  NORMAN ASPEN, LCSW Phone Number: 02/25/2024, 9:57 AM   Clinical Narrative:     Met with pt who confirms she has needed DME in the home.  Plan for HEP.  No further IP CM needs.  Final next level of care: Home/Self Care Barriers to Discharge: No Barriers Identified   Patient Goals and CMS Choice Patient states their goals for this hospitalization and ongoing recovery are:: return home          Discharge Placement                       Discharge Plan and Services Additional resources added to the After Visit Summary for                  DME Arranged: N/A DME Agency: NA                  Social Drivers of Health (SDOH) Interventions SDOH Screenings   Food Insecurity: No Food Insecurity (02/24/2024)  Housing: Low Risk  (02/24/2024)  Transportation Needs: No Transportation Needs (02/24/2024)  Utilities: Not At Risk (02/24/2024)  Alcohol  Screen: Low Risk  (08/13/2023)  Depression (PHQ2-9): Low Risk  (11/29/2023)  Financial Resource Strain: High Risk (08/13/2023)  Physical Activity: Insufficiently Active (08/13/2023)  Social Connections: Socially Integrated (02/24/2024)  Stress: No Stress Concern Present (08/13/2023)  Tobacco Use: Medium Risk (02/24/2024)  Health Literacy: Adequate Health Literacy (08/13/2023)     Readmission Risk Interventions    02/25/2024    9:56 AM  Readmission Risk Prevention Plan  Transportation Screening Complete  PCP or Specialist Appt within 5-7 Days Complete  Home Care Screening Complete  Medication Review (RN CM) Complete

## 2024-02-25 NOTE — Discharge Summary (Signed)
 Physician Discharge Summary  Patient ID: Debra Farmer MRN: [id number] DOB/AGE: 01-02-1946 78 y.o.  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 female who was admitted 02/24/2024 with a diagnosis of S/p revision of left total hip and went to the operating room on 02/24/2024 and underwent the above named procedures.    She was given perioperative antibiotics:  Anti-infectives (From admission, onward)    Start     Dose/Rate Route Frequency Ordered Stop   02/24/24 2000  ceFAZolin  (ANCEF ) IVPB 2g/100 mL premix        2 g 200 mL/hr over 30 Minutes Intravenous Every 6 hours 02/24/24 1747 02/25/24 0312   02/24/24 1521  vancomycin  (VANCOCIN ) powder  Status:  Discontinued          As needed 02/24/24 1522 02/24/24 1617   02/24/24 1000  ceFAZolin  (ANCEF ) IVPB 2g/100 mL premix        2 g 200 mL/hr over 30 Minutes Intravenous On call to O.R. 02/24/24 0945 02/24/24 1402       She was given sequential compression devices, early ambulation, and aspirin  for DVT prophylaxis.  POD#1 Patient doing well she ambulated 70 feet with PT able to maintain posterior hip precautions.  D/c home with JP drain. - Upon discharge, the house VAC suction unit was exchanged for a portable Prevena  suction unit. She will need to follow-up in the office within 7 days of discharge for removal of her negative pressure dressing..     She benefited maximally from the hospital stay and there were no complications.    Recent vital signs:  Vitals:   02/24/24 2343 02/25/24 0543  BP: (!) 172/65 (!) 155/64  Pulse: 68 78  Resp: 19 18  Temp: 97.6 F (36.4 C) 98 F (36.7 C)  SpO2: 97% 97%    Recent laboratory studies:  Lab Results  Component Value Date   HGB 13.3 02/25/2024   HGB 14.1 02/15/2024   HGB 14.2 01/11/2024   Lab Results  Component Value Date   WBC 11.0 (H) 02/25/2024   PLT 213 02/25/2024   Lab Results  Component Value Date   INR 0.9 01/11/2024   Lab Results  Component Value Date   NA 139 02/25/2024   K 4.4 02/25/2024   CL 102 02/25/2024   CO2 27 02/25/2024   BUN 17 02/25/2024   CREATININE 0.93 02/25/2024   GLUCOSE 214 (H) 02/25/2024     Allergies as of 02/25/2024       Reactions   Aspirin  Other (See Comments)   UPSET STOMACH. MUST HAVE COATED ASA   Codeine Sulfate Other (See Comments)   drugged out feeling & hallucinations   Diltiazem     fatigue   Gabapentin   Too sedated   Kevzara  [sarilumab ] Other (See Comments)    increased cholesterol   Lipitor [atorvastatin] Other (See Comments)   Muscle cramps   Methotrexate  Nausea And Vomiting, Swelling   Miralax  [polyethylene Glycol]    bloating   Nabumetone Other (See Comments)   Unsure of reaction (reaction occurred sometime ago)   Plaquenil [hydroxychloroquine]    Weak, blurred vision   Pravastatin     Weak legs, arthralgias   Senna Other (See Comments)   Causes bleeding from the anus   Statins Other (See Comments)   Muscle aches and cramps        Medication List     PAUSE taking these medications    Enbrel SureClick 50 MG/ML injection Wait to take this until your doctor or other care provider tells you to start again. Generic drug: etanercept Inject 50 mg into the skin every  Sunday.       STOP taking these medications    leflunomide  20 MG tablet Commonly known as: ARAVA    traMADol  50 MG tablet Commonly known as: ULTRAM        TAKE these medications    acetaminophen  650 MG CR tablet Commonly known as: TYLENOL  Take 650 mg by mouth 2 (two) times daily as needed for pain.   aspirin  EC 81 MG tablet Take 1 tablet (81 mg total) by mouth 2 (two) times daily with a meal. Swallow whole. What changed: when to take this   chlorhexidine  4 % external liquid Commonly known as: HIBICLENS  Apply 15 mLs (1 Application total) topically as directed for 30 doses. Use as directed daily for 5 days every other week for 6 weeks.   docusate sodium  100 MG capsule Commonly known as: Colace Take 1 capsule (100 mg total) by mouth 2 (two) times daily.   furosemide  20 MG tablet Commonly known as: LASIX  TAKE 1-2 TABLETS BY MOUTH DAILY AS NEEDED FOR EDEMA   HYDROcodone -acetaminophen  5-325 MG tablet Commonly known as: NORCO/VICODIN Take 1 tablet by mouth every 4 (four) hours as needed for up to 7 days for moderate pain (pain score 4-6) or severe pain (pain score 7-10).   IMMUNE SUPPORT PO Take 1 tablet by mouth daily after breakfast. Nature's Bounty Immune 24 Hour +   MENOPAUSE SUPPORT PO Take 1 tablet by mouth daily after breakfast. Amberen Menopause   mupirocin  ointment 2 % Commonly known as: BACTROBAN  Place 1 Application into the nose 2 (two) times daily for 60 doses. Use as directed 2 times daily for 5 days every other week for 6 weeks.   nitroGLYCERIN  0.4 MG SL tablet Commonly known as: NITROSTAT  Place 1 tablet (0.4 mg total) under the tongue every 5 (five) minutes x 3 doses as needed for chest pain.   ondansetron  4 MG tablet Commonly known as: Zofran  Take 1 tablet (4 mg total) by mouth every 8 (eight) hours as needed for nausea or vomiting.   OVER THE COUNTER MEDICATION Take 2 capsules by mouth in the morning and at bedtime. Lion's Mane 50 mg/capsule    polyethylene glycol 17 g packet Commonly known as: MiraLax  Take 17 g by mouth daily as needed for mild constipation or moderate constipation.   potassium chloride  10 MEQ tablet Commonly known as: KLOR-CON  M Take 1 tablet (10 mEq total) by mouth daily. What changed:  when to take this reasons to take this   predniSONE  1 MG tablet Commonly known as: DELTASONE  Take 4 mg by mouth daily after breakfast.   VITAMIN B-12 PO Take  1 drop by mouth in the morning.   Vitamin D  (Ergocalciferol ) 1.25 MG (50000 UNIT) Caps capsule Commonly known as: DRISDOL  Take 1 capsule (50,000 Units total) by mouth every 14 (fourteen) days.   zolpidem  10 MG tablet Commonly known as: AMBIEN  TAKE 1 TABLET BY MOUTH AT BEDTIME FOR SLEEP               Discharge Care Instructions  (From admission, onward)           Start     Ordered   02/25/24 0000  Weight bearing as tolerated        02/25/24 1037   02/25/24 0000  Change dressing       Comments: Do not change your dressing.   02/25/24 1037              WEIGHT BEARING   Weight bearing as tolerated with assist device (walker, cane, etc) as directed, use it as long as suggested by your surgeon or therapist, typically at least 4-6 weeks. Posterior hip precautions.    EXERCISES  Results after joint replacement surgery are often greatly improved when you follow the exercise, range of motion and muscle strengthening exercises prescribed by your doctor. Safety measures are also important to protect the joint from further injury. Any time any of these exercises cause you to have increased pain or swelling, decrease what you are doing until you are comfortable again and then slowly increase them. If you have problems or questions, call your caregiver or physical therapist for advice.   Rehabilitation is important following a joint replacement. After just a few days of immobilization, the muscles of the leg can become weakened and shrink  (atrophy).  These exercises are designed to build up the tone and strength of the thigh and leg muscles and to improve motion. Often times heat used for twenty to thirty minutes before working out will loosen up your tissues and help with improving the range of motion but do not use heat for the first two weeks following surgery (sometimes heat can increase post-operative swelling).   These exercises can be done on a training (exercise) mat, on the floor, on a table or on a bed. Use whatever works the best and is most comfortable for you.    Use music or television while you are exercising so that the exercises are a pleasant break in your day. This will make your life better with the exercises acting as a break in your routine that you can look forward to.   Perform all exercises about fifteen times, three times per day or as directed.  You should exercise both the operative leg and the other leg as well.  Exercises include:   Quad Sets - Tighten up the muscle on the front of the thigh (Quad) and hold for 5-10 seconds.   Straight Leg Raises - With your knee straight (if you were given a brace, keep it on), lift the leg to 60 degrees, hold for 3 seconds, and slowly lower the leg.  Perform this exercise against resistance later as your leg gets stronger.  Leg Slides: Lying on your back, slowly slide your foot toward your buttocks, bending your knee up off the floor (only go as far as is comfortable). Then slowly slide your foot back down until your leg is flat on the floor again.  Angel Wings: Lying on your back spread your legs to the side as far apart as you can without causing discomfort.  Hamstring Strength:  Lying on your back, push your heel against the floor with your leg straight by tightening up the muscles of your buttocks.  Repeat, but this time bend your knee to a comfortable angle, and push your heel against the floor.  You may put a pillow under the heel to make it more comfortable if  necessary.   A rehabilitation program following joint replacement surgery can speed recovery and prevent re-injury in the future due to weakened muscles. Contact your doctor or a physical therapist for more information on knee rehabilitation.    CONSTIPATION  Constipation is defined medically as fewer than three stools per week and severe constipation as less than one stool per week.  Even if you have a regular bowel pattern at home, your normal regimen is likely to be disrupted due to multiple reasons following surgery.  Combination of anesthesia, postoperative narcotics, change in appetite and fluid intake all can affect your bowels.   YOU MUST use at least one of the following options; they are listed in order of increasing strength to get the job done.  They are all available over the counter, and you may need to use some, POSSIBLY even all of these options:    Drink plenty of fluids (prune juice may be helpful) and high fiber foods Colace 100 mg by mouth twice a day  Senokot for constipation as directed and as needed Dulcolax (bisacodyl ), take with full glass of water   Miralax  (polyethylene glycol) once or twice a day as needed.  If you have tried all these things and are unable to have a bowel movement in the first 3-4 days after surgery call either your surgeon or your primary doctor.    If you experience loose stools or diarrhea, hold the medications until you stool forms back up.  If your symptoms do not get better within 1 week or if they get worse, check with your doctor.  If you experience the worst abdominal pain ever or develop nausea or vomiting, please contact the office immediately for further recommendations for treatment.   ITCHING:  If you experience itching with your medications, try taking only a single pain pill, or even half a pain pill at a time.  You can also use Benadryl  over the counter for itching or also to help with sleep.   TED HOSE STOCKINGS:  Use stockings  on both legs until for at least 2 weeks or as directed by physician office. They may be removed at night for sleeping.  MEDICATIONS:  See your medication summary on the "After Visit Summary" that nursing will review with you.  You may have some home medications which will be placed on hold until you complete the course of blood thinner medication.  It is important for you to complete the blood thinner medication as prescribed.  PRECAUTIONS:  If you experience chest pain or shortness of breath - call 911 immediately for transfer to the hospital emergency department.   If you develop a fever greater that 101 F, purulent drainage from wound, increased redness or drainage from wound, foul odor from the wound/dressing, or calf pain - CONTACT YOUR SURGEON.                                                   FOLLOW-UP APPOINTMENTS:  If you do not already  have a post-op appointment, please call the office for an appointment to be seen by your surgeon.  Guidelines for how soon to be seen are listed in your "After Visit Summary", but are typically between 1-4 weeks after surgery.  OTHER INSTRUCTIONS:   Knee Replacement:  Do not place pillow under knee, focus on keeping the knee straight while resting. CPM instructions: 0-90 degrees, 2 hours in the morning, 2 hours in the afternoon, and 2 hours in the evening. Place foam block, curve side up under heel at all times except when in CPM or when walking.  DO NOT modify, tear, cut, or change the foam block in any way.   MAKE SURE YOU:  Understand these instructions.  Get help right away if you are not doing well or get worse.    Thank you for letting us  be a part of your medical care team.  It is a privilege we respect greatly.  We hope these instructions will help you stay on track for a fast and full recovery!   Diagnostic Studies: DG Pelvis Portable Result Date: 02/24/2024 CLINICAL DATA:  [id number] S/p revision of left total hip [id number] EXAM: PORTABLE PELVIS  1-2 VIEWS COMPARISON:  02/25/2023 FINDINGS: Both hip arthroplasties are anatomically aligned without acute fracture or dislocation.No evidence of pelvic fracture or diastasis.Subcutaneous edema and gas present, an expected finding at this stage in the postoperative recovery.No radiopaque foreign body or retained surgical instrument. Surgical skin staples with postsurgical drainage catheter and likely wound VAC overlying the left hip. IMPRESSION: Well-aligned bilateral hip arthroplasties without acute fracture or dislocation. No radiopaque foreign body or retained surgical instrument. Electronically Signed   By: Rogelia Myers M.D.   On: 02/24/2024 17:20   DG HIP UNILAT WITH PELVIS 1V LEFT Result Date: 02/24/2024 EXAM: 1 VIEW XRAY OF THE LEFT HIP 02/24/2024 03:37:00 PM COMPARISON: None available. CLINICAL HISTORY: Surgery, elective. Left total hip replacement. FINDINGS: BONES AND JOINTS: Left hip arthroplasty components projecting expected location. Right hip arthroplasty, incompletely visualized. SOFT TISSUES: The soft tissues are unremarkable. IMPRESSION: 1. Left hip arthroplasty components in expected location. 2. Right hip arthroplasty, incompletely visualized. Electronically signed by: Katheleen Faes MD 02/24/2024 03:44 PM EDT RP Workstation: [id number]   DG C-Arm 1-60 Min-No Report Result Date: 02/24/2024 Fluoroscopy was utilized by the requesting physician.  No radiographic interpretation.   DG C-Arm 1-60 Min-No Report Result Date: 02/24/2024 Fluoroscopy was utilized by the requesting physician.  No radiographic interpretation.   DG C-Arm 1-60 Min-No Report Result Date: 02/24/2024 Fluoroscopy was utilized by the requesting physician.  No radiographic interpretation.    Disposition: Discharge disposition: 01-Home or Self Care       Discharge Instructions     Call MD / Call 911   Complete by: As directed    If you experience chest pain or shortness of breath, CALL 911 and be transported  to the hospital emergency room.  If you develope a fever above 101 F, pus (white drainage) or increased drainage or redness at the wound, or calf pain, call your surgeon's office.   Change dressing   Complete by: As directed    Do not change your dressing.   Constipation Prevention   Complete by: As directed    Drink plenty of fluids.  Prune juice may be helpful.  You may use a stool softener, such as Colace (over the counter) 100 mg twice a day.  Use MiraLax  (over the counter) for constipation as needed.   Diet - low sodium  heart healthy   Complete by: As directed    Discharge instructions   Complete by: As directed    Elevate toes above nose. Use cryotherapy as needed for pain and swelling.  Posterior hip precautions.  Keep dressing clean and dry.  Keep a log of your JP drain output.   Driving restrictions   Complete by: As directed    No driving for 6 weeks   Follow the hip precautions as taught in Physical Therapy   Complete by: As directed    Posterior hip precautions.   Increase activity slowly as tolerated   Complete by: As directed    Lifting restrictions   Complete by: As directed    No lifting for 6 weeks   Post-operative opioid taper instructions:   Complete by: As directed    POST-OPERATIVE OPIOID TAPER INSTRUCTIONS: It is important to wean off of your opioid medication as soon as possible. If you do not need pain medication after your surgery it is ok to stop day one. Opioids include: Codeine, Hydrocodone (Norco, Vicodin), Oxycodone (Percocet, oxycontin ) and hydromorphone  amongst others.  Long term and even short term use of opiods can cause: Increased pain response Dependence Constipation Depression Respiratory depression And more.  Withdrawal symptoms can include Flu like symptoms Nausea, vomiting And more Techniques to manage these symptoms Hydrate well Eat regular healthy meals Stay active Use relaxation techniques(deep breathing, meditating, yoga) Do  Not substitute Alcohol  to help with tapering If you have been on opioids for less than two weeks and do not have pain than it is ok to stop all together.  Plan to wean off of opioids This plan should start within one week post op of your joint replacement. Maintain the same interval or time between taking each dose and first decrease the dose.  Cut the total daily intake of opioids by one tablet each day Next start to increase the time between doses. The last dose that should be eliminated is the evening dose.      TED hose   Complete by: As directed    Use stockings (TED hose) for 2 weeks on both leg(s).  You may remove them at night for sleeping.   Weight bearing as tolerated   Complete by: As directed         Follow-up Information     Leigh Valery RAMAN, PA-C. Schedule an appointment as soon as possible for a visit in 2 week(s).   Specialty: Orthopedic Surgery Why: For suture removal, For wound re-check Contact information: [address], [address] [place] [address] [phone]                  Signed: Valery RAMAN Leigh 02/25/2024, 5:37 PM

## 2024-02-25 NOTE — Progress Notes (Signed)
    Subjective:  Patient reports pain as mild.  Denies N/V/CP/SOB/Abd pain. Denies new tingling or numbness in LE bilaterally.  She reports pain well controlled.  Catheter removed this morning, void pending.  Discussed drains and dressings.   Objective:   VITALS:   Vitals:   02/24/24 1745 02/24/24 2024 02/24/24 2343 02/25/24 0543  BP: (!) 143/56 (!) 163/68 (!) 172/65 (!) 155/64  Pulse: 62 69 68 78  Resp: 17 17 19 18   Temp: (!) 97.4 F (36.3 C) 98 F (36.7 C) 97.6 F (36.4 C) 98 F (36.7 C)  TempSrc: Oral Oral Oral Oral  SpO2: 99% 98% 97% 97%  Weight:      Height:        NAD Neurologically intact ABD soft Neurovascular intact Sensation intact distally Intact pulses distally Dorsiflexion/Plantar flexion intact No cellulitis present Compartment soft Prevena negative pressure dressing C/D/I . No leaks detected.  JP dressing C/D/I. Output 33cc SS fluid. Continue.   Lab Results  Component Value Date   WBC 11.0 (H) 02/25/2024   HGB 13.3 02/25/2024   HCT 42.3 02/25/2024   MCV 91.2 02/25/2024   PLT 213 02/25/2024   BMET    Component Value Date/Time   NA 139 02/25/2024 0315   NA 136 (A) 03/23/2018 0000   K 4.4 02/25/2024 0315   CL 102 02/25/2024 0315   CO2 27 02/25/2024 0315   GLUCOSE 214 (H) 02/25/2024 0315   BUN 17 02/25/2024 0315   BUN 16 03/23/2018 0000   CREATININE 0.93 02/25/2024 0315   CREATININE 0.72 01/23/2020 1608   CALCIUM 8.9 02/25/2024 0315   GFRNONAA >60 02/25/2024 0315     Assessment/Plan: 1 Day Post-Op   Principal Problem:   S/p revision of left total hip   WBAT with walker, posterior hip precautions.  DVT ppx: Aspirin , SCDs, TEDS PO pain control PT/OT: to come today.  Dispo:  - D/c home with HEP once cleared with PT - Maintain negative pressure dressing at 75 mmHg.  - We will discontinue the JP drain when output is less than 10 cc per shift. - Mobilize out of bed with PT/OT.  - Upon discharge, the house VAC suction unit  will be exchanged for a portable Prevena suction unit. She will need to follow-up in the office within 7 days of discharge for removal of her negative pressure dressing.SABRA Valery GORMAN Leigh 02/25/2024, 7:44 AM   [facility]  [place] [address], [address], [place], [place] [address] Phone: [phone] [email or web address] Facebook  [employer]

## 2024-02-25 NOTE — Evaluation (Signed)
 Physical Therapy Evaluation Patient Details Name: MODELLE VOLLMER MRN: [id number] DOB: 12-30-1945 Today's Date: 02/25/2024  History of Present Illness  Pt s/p L THR liner/ball revision and with hx of bil THR, COPD, MI, CAD and back surgery  Clinical Impression  Pt admitted as above and presenting with functional mobility limitations 2* decreased L LE strength/ROM, post op pain, and posterior THP.  This am, pt up to ambulate in hall with distance limited by pt c/o intermittent pain where that one staple is just pulling at my skin - RN aware.  Pt negotiated stairs with bil rails.  Pt eager for dc home today.        If plan is discharge home, recommend the following: A little help with walking and/or transfers;A little help with bathing/dressing/bathroom;Assistance with cooking/housework;Assist for transportation;Help with stairs or ramp for entrance   Can travel by private vehicle        Equipment Recommendations None recommended by PT  Recommendations for Other Services       Functional Status Assessment Patient has had a recent decline in their functional status and demonstrates the ability to make significant improvements in function in a reasonable and predictable amount of time.     Precautions / Restrictions Precautions Precautions: Fall;Posterior Hip Precaution Booklet Issued: Yes (comment) Recall of Precautions/Restrictions: Intact Precaution/Restrictions Comments: Pt recalls 2/3 THP - written instruction provided and reviewed Restrictions Weight Bearing Restrictions Per Provider Order: Yes LLE Weight Bearing Per Provider Order: Weight bearing as tolerated      Mobility  Bed Mobility               General bed mobility comments: Pt up in chair and requests back to same    Transfers Overall transfer level: Needs assistance Equipment used: Rolling walker (2 wheels) Transfers: Sit to/from Stand Sit to Stand: Contact guard assist, Supervision            General transfer comment: cues for LE management, use of UEs to self assist, and adherence to THP    Ambulation/Gait Ambulation/Gait assistance: Contact guard assist Gait Distance (Feet): 70 Feet (and additional 25' to practice stairs) Assistive device: Rolling walker (2 wheels) Gait Pattern/deviations: Step-to pattern, Decreased step length - right, Decreased step length - left, Shuffle, Trunk flexed Gait velocity: dec     General Gait Details: min cues for sequence; pt limited by c/o pain and pulling right where that one staple is with standing upright and WB  Stairs Stairs: Yes Stairs assistance: Min assist Stair Management: Two rails, Step to pattern, Forwards Number of Stairs: 5 General stair comments: 2+3 stairs with min cues for sequence  Wheelchair Mobility     Tilt Bed    Modified Rankin (Stroke Patients Only)       Balance Overall balance assessment: Needs assistance Sitting-balance support: No upper extremity supported, Feet supported Sitting balance-Leahy Scale: Good     Standing balance support: Bilateral upper extremity supported Standing balance-Leahy Scale: Poor                               Pertinent Vitals/Pain Pain Assessment Pain Assessment: 0-10 Pain Score: 8  Pain Location: R hip with mobility/WB Pain Descriptors / Indicators: Aching, Grimacing, Moaning, Jabbing, Sharp Pain Intervention(s): Limited activity within patient's tolerance, Monitored during session, Premedicated before session, Ice applied    Home Living Family/patient expects to be discharged to:: Private residence Living Arrangements: Spouse/significant other Available Help at Discharge:  Family;Available 24 hours/day Type of Home: House Home Access: Stairs to enter Entrance Stairs-Rails: Right;Left;Can reach both Entrance Stairs-Number of Steps: 3   Home Layout: One level Home Equipment: Agricultural consultant (2 wheels);Grab bars - tub/shower;Cane - single point       Prior Function Prior Level of Function : Independent/Modified Independent                     Extremity/Trunk Assessment   Upper Extremity Assessment Upper Extremity Assessment: Overall WFL for tasks assessed    Lower Extremity Assessment Lower Extremity Assessment: LLE deficits/detail    Cervical / Trunk Assessment Cervical / Trunk Assessment: Normal  Communication   Communication Communication: No apparent difficulties    Cognition Arousal: Alert Behavior During Therapy: WFL for tasks assessed/performed, Impulsive   PT - Cognitive impairments: No apparent impairments                         Following commands: Intact       Cueing Cueing Techniques: Verbal cues     General Comments      Exercises Total Joint Exercises Ankle Circles/Pumps: AROM, Both, 15 reps, Supine   Assessment/Plan    PT Assessment Patient needs continued PT services  PT Problem List Decreased strength;Decreased range of motion;Decreased activity tolerance;Decreased balance;Decreased mobility;Decreased knowledge of use of DME;Pain       PT Treatment Interventions DME instruction;Gait training;Stair training;Functional mobility training;Therapeutic activities;Therapeutic exercise;Patient/family education    PT Goals (Current goals can be found in the Care Plan section)  Acute Rehab PT Goals Patient Stated Goal: HOME PT Goal Formulation: With patient Time For Goal Achievement: 03/03/24 Potential to Achieve Goals: Good    Frequency 7X/week     Co-evaluation               AM-PAC PT 6 Clicks Mobility  Outcome Measure Help needed turning from your back to your side while in a flat bed without using bedrails?: A Little Help needed moving from lying on your back to sitting on the side of a flat bed without using bedrails?: A Little Help needed moving to and from a bed to a chair (including a wheelchair)?: A Little Help needed standing up from a chair using  your arms (e.g., wheelchair or bedside chair)?: A Little Help needed to walk in hospital room?: A Little Help needed climbing 3-5 steps with a railing? : A Little 6 Click Score: 18    End of Session Equipment Utilized During Treatment: Gait belt Activity Tolerance: Patient tolerated treatment well;Patient limited by pain Patient left: in chair;with call bell/phone within reach;with chair alarm set Nurse Communication: Mobility status;Other (comment) (c/o pain in standing) PT Visit Diagnosis: Difficulty in walking, not elsewhere classified (R26.2)    Time: 9052-8979 PT Time Calculation (min) (ACUTE ONLY): 33 min   Charges:   PT Evaluation $PT Eval Low Complexity: 1 Low PT Treatments $Gait Training: 8-22 mins PT General Charges $$ ACUTE PT VISIT: 1 Visit         [facility] PT Acute Rehabilitation Services Office [phone]   Victorina Kable 02/25/2024, 12:39 PM

## 2024-02-28 ENCOUNTER — Encounter (HOSPITAL_COMMUNITY): Payer: Self-pay | Admitting: Orthopedic Surgery

## 2024-02-28 NOTE — Anesthesia Postprocedure Evaluation (Signed)
 Anesthesia Post Note  Patient: Debra Farmer  Procedure(s) Performed: TOTAL HIP REVISION (Left: Hip)     Patient location during evaluation: PACU Anesthesia Type: MAC and Spinal Level of consciousness: awake and alert Pain management: pain level controlled Vital Signs Assessment: post-procedure vital signs reviewed and stable Respiratory status: spontaneous breathing, nonlabored ventilation and respiratory function stable Cardiovascular status: stable and blood pressure returned to baseline Postop Assessment: no apparent nausea or vomiting Anesthetic complications: no   No notable events documented.                  Karen Huhta

## 2024-03-13 DIAGNOSIS — Z96642 Presence of left artificial hip joint: Secondary | ICD-10-CM | POA: Diagnosis not present

## 2024-03-13 DIAGNOSIS — Z471 Aftercare following joint replacement surgery: Secondary | ICD-10-CM | POA: Diagnosis not present

## 2024-04-12 ENCOUNTER — Emergency Department (HOSPITAL_COMMUNITY)

## 2024-04-12 ENCOUNTER — Other Ambulatory Visit: Payer: Self-pay

## 2024-04-12 ENCOUNTER — Emergency Department (HOSPITAL_COMMUNITY)
Admission: EM | Admit: 2024-04-12 | Discharge: 2024-04-12 | Disposition: A | Discharge status: Home / Self Care | Attending: Emergency Medicine | Admitting: Emergency Medicine

## 2024-04-12 DIAGNOSIS — X501XXA Overexertion from prolonged static or awkward postures, initial encounter: Secondary | ICD-10-CM | POA: Insufficient documentation

## 2024-04-12 DIAGNOSIS — S79912A Unspecified injury of left hip, initial encounter: Secondary | ICD-10-CM | POA: Diagnosis present

## 2024-04-12 DIAGNOSIS — S73035A Other anterior dislocation of left hip, initial encounter: Secondary | ICD-10-CM | POA: Diagnosis not present

## 2024-04-12 DIAGNOSIS — T84021A Dislocation of internal left hip prosthesis, initial encounter: Secondary | ICD-10-CM | POA: Diagnosis not present

## 2024-04-12 DIAGNOSIS — Z7982 Long term (current) use of aspirin: Secondary | ICD-10-CM | POA: Insufficient documentation

## 2024-04-12 DIAGNOSIS — S73005A Unspecified dislocation of left hip, initial encounter: Secondary | ICD-10-CM | POA: Insufficient documentation

## 2024-04-12 DIAGNOSIS — S73005D Unspecified dislocation of left hip, subsequent encounter: Secondary | ICD-10-CM | POA: Diagnosis not present

## 2024-04-12 MED ORDER — PROPOFOL 10 MG/ML IV BOLUS
0.5000 mg/kg | Freq: Once | INTRAVENOUS | Status: AC
Start: 2024-04-12 — End: 2024-04-12
  Administered 2024-04-12: 33.4 mg via INTRAVENOUS
  Filled 2024-04-12: qty 20

## 2024-04-12 MED ORDER — SODIUM CHLORIDE 0.9 % IV SOLN
INTRAVENOUS | Status: AC | PRN
  Administered 2024-04-12: 1000 mL via INTRAVENOUS

## 2024-04-12 MED ORDER — MORPHINE SULFATE (PF) 4 MG/ML IV SOLN
4.0000 mg | Freq: Once | INTRAVENOUS | Status: AC
  Administered 2024-04-12: 4 mg via INTRAVENOUS
  Filled 2024-04-12: qty 1

## 2024-04-12 MED ORDER — PROPOFOL 1000 MG/100ML IV EMUL
INTRAVENOUS | Status: AC | PRN
  Administered 2024-04-12: 33.4 mg via INTRAVENOUS

## 2024-04-12 MED ORDER — ONDANSETRON HCL 4 MG/2ML IJ SOLN
4.0000 mg | Freq: Once | INTRAMUSCULAR | Status: AC
  Administered 2024-04-12: 4 mg via INTRAVENOUS
  Filled 2024-04-12: qty 2

## 2024-04-12 NOTE — Progress Notes (Signed)
 Orthopedic Tech Progress Note Patient Details:  JADDA HUNSUCKER 1945/10/01 [id number]  Ortho Devices Type of Ortho Device: Knee Immobilizer Ortho Device/Splint Location: left Ortho Device/Splint Interventions: Ordered, Application, Adjustment   Post Interventions Patient Tolerated: Well Instructions Provided: Adjustment of device, Care of device  Waylan Thom Loving 04/12/2024, 2:41 PM

## 2024-04-12 NOTE — ED Triage Notes (Signed)
 170/50 77HR 99% on RA 20g LAC given 50mcg of fentanly by [facility]  Hip dislocation after using the rr, did not fall, no LOC, hx of hip dislocation (2 in last year)/

## 2024-04-12 NOTE — Procedures (Signed)
 Respiratory Therapist at [facility]  in room number 23____ during procedure.  Suction with [place] at [facility] set up and ready to use.YES Ambu bag at [facility] and ready to use. YES Patient placed on ETCO2 Nasal Cannula at __4__ LPM.    Vitals at conclusion of procedure:  ETCO2 ___18__ mmHg HR 75 RR 20  SPO2 100%

## 2024-04-12 NOTE — Discharge Instructions (Addendum)
 Return for any problem.  Follow-up with Dr. Cherylene tomorrow.   Keep knee immobilizer on until cleared by Dr. Fidel.

## 2024-04-12 NOTE — ED Provider Notes (Signed)
 [place] EMERGENCY DEPARTMENT AT [facility] Provider Note   CSN: [id number] Arrival date & time: 04/12/24  1251     Patient presents with: Hip Pain   Debra Farmer is a 78 y.o. female.   78 year old female with prior medical history as detailed below presents for evaluation.  Patient reports recent left total hip revision with Dr. Fidel on September 11.  This afternoon she was using the restroom and as she bent over to pull her pants up she felt her left hip dislocate.  She had multiple prior dislocations with the hip prior to the most recent revision.  She reports that this pain is exactly the same as the last time it dislocated.  Her last p.o. intake was at 630 morning.  She denies prior issues with sedation in the ED with hip reduction.  She reports mild pain to the left hip.  She was not able to ambulate after the hip popped out.  The history is provided by the patient and medical records.       Prior to Admission medications   Medication Sig Start Date End Date Taking? Authorizing Provider  acetaminophen  (TYLENOL ) 650 MG CR tablet Take 650 mg by mouth 2 (two) times daily as needed for pain.    [provider]  aspirin  EC 81 MG tablet Take 1 tablet (81 mg total) by mouth 2 (two) times daily with a meal. Swallow whole. 02/25/24   Hill, Valery RAMAN, PA-C  chlorhexidine  (HIBICLENS ) 4 % external liquid Apply 15 mLs (1 Application total) topically as directed for 30 doses. Use as directed daily for 5 days every other week for 6 weeks. 02/24/24   Hill, Valery RAMAN, PA-C  Cyanocobalamin  (VITAMIN B-12 PO) Take 1 drop by mouth in the morning.    [provider]  ENBREL SURECLICK 50 MG/ML injection Inject 50 mg into the skin every Sunday. 10/02/23   [provider]  furosemide  (LASIX ) 20 MG tablet TAKE 1-2 TABLETS BY MOUTH DAILY AS NEEDED FOR EDEMA 06/23/23   Plotnikov, Aleksei V, MD  Multiple Vitamins-Minerals (IMMUNE SUPPORT PO) Take 1 tablet by  mouth daily after breakfast. Nature's Bounty Immune 24 Hour +    [provider]  nitroGLYCERIN  (NITROSTAT ) 0.4 MG SL tablet Place 1 tablet (0.4 mg total) under the tongue every 5 (five) minutes x 3 doses as needed for chest pain. 07/21/20   Claudene Pacific, MD  ondansetron  (ZOFRAN ) 4 MG tablet Take 1 tablet (4 mg total) by mouth every 8 (eight) hours as needed for nausea or vomiting. 02/25/24 02/24/25  Leigh Valery RAMAN, PA-C  OVER THE COUNTER MEDICATION Take 2 capsules by mouth in the morning and at bedtime. Lion's Mane 50 mg/capsule    [provider]  potassium chloride  (KLOR-CON  M) 10 MEQ tablet Take 1 tablet (10 mEq total) by mouth daily. Patient taking differently: Take 10 mEq by mouth daily as needed (when taking furosemide ). 11/30/22   Plotnikov, Aleksei V, MD  predniSONE  (DELTASONE ) 1 MG tablet Take 4 mg by mouth daily after breakfast. 01/27/24   [provider]  Specialty Vitamins Products (MENOPAUSE SUPPORT PO) Take 1 tablet by mouth daily after breakfast. Amberen Menopause    [provider]  Vitamin D , Ergocalciferol , (DRISDOL ) 1.25 MG (50000 UNIT) CAPS capsule Take 1 capsule (50,000 Units total) by mouth every 14 (fourteen) days. 12/02/23   Plotnikov, Karlynn GAILS, MD  zolpidem  (AMBIEN ) 10 MG tablet TAKE 1 TABLET BY MOUTH AT BEDTIME FOR SLEEP 01/03/24  Plotnikov, Aleksei V, MD    Allergies: Aspirin , Codeine sulfate, Diltiazem , Gabapentin , Kevzara  [sarilumab ], Lipitor [atorvastatin], Methotrexate , Miralax  [polyethylene glycol], Nabumetone, Plaquenil [hydroxychloroquine], Pravastatin , Senna, and Statins    Review of Systems  All other systems reviewed and are negative.   Updated Vital Signs Ht 5' (1.524 m)   BMI 26.74 kg/m   Physical Exam Vitals and nursing note reviewed.  Constitutional:      General: She is not in acute distress.    Appearance: She is well-developed.  HENT:     Head: Normocephalic and atraumatic.  Eyes:     Conjunctiva/sclera:  Conjunctivae normal.  Cardiovascular:     Rate and Rhythm: Normal rate and regular rhythm.     Heart sounds: No murmur heard. Pulmonary:     Effort: Pulmonary effort is normal. No respiratory distress.     Breath sounds: Normal breath sounds.  Abdominal:     Palpations: Abdomen is soft.     Tenderness: There is no abdominal tenderness.  Musculoskeletal:        General: Tenderness present. No swelling.     Cervical back: Neck supple.     Comments: Tender to the lateral aspect of the left hip.  Left lower extremity is shortened and externally rotated consistent with likely hip dislocation.  Skin:    General: Skin is warm and dry.     Capillary Refill: Capillary refill takes less than 2 seconds.  Neurological:     Mental Status: She is alert.  Psychiatric:        Mood and Affect: Mood normal.     (all labs ordered are listed, but only abnormal results are displayed) Labs Reviewed - No data to display  EKG: None  Radiology: No results found.   .Sedation  Date/Time: 04/12/2024 3:16 PM  Performed by: Laurice Maude BROCKS, MD Authorized by: Laurice Maude BROCKS, MD   Consent:    Consent obtained:  Verbal   Consent given by:  Patient   Risks discussed:  Allergic reaction, dysrhythmia, inadequate sedation, nausea, prolonged hypoxia resulting in organ damage, prolonged sedation necessitating reversal, respiratory compromise necessitating ventilatory assistance and intubation and vomiting   Alternatives discussed:  Analgesia without sedation, anxiolysis and regional anesthesia Universal protocol:    Procedure explained and questions answered to patient or proxy's satisfaction: yes     Relevant documents present and verified: yes     Test results available: yes     Imaging studies available: yes     Required blood products, implants, devices, and special equipment available: yes     Site/side marked: yes     Immediately prior to procedure, a time out was called: yes     Patient  identity confirmed:  Verbally with patient Indications:    Procedure necessitating sedation performed by:  Physician performing sedation Pre-sedation assessment:    Time since last food or drink:  6   ASA classification: class 1 - normal, healthy patient     Mouth opening:  3 or more finger widths   Thyromental distance:  4 finger widths   Mallampati score:  I - soft palate, uvula, fauces, pillars visible   Neck mobility: normal     Pre-sedation assessments completed and reviewed: airway patency, cardiovascular function, hydration status, mental status, nausea/vomiting, pain level, respiratory function and temperature   A pre-sedation assessment was completed prior to the start of the procedure Immediate pre-procedure details:    Reassessment: Patient reassessed immediately prior to procedure     Reviewed: vital  signs, relevant labs/tests and NPO status     Verified: bag valve mask available, emergency equipment available, intubation equipment available, IV patency confirmed, oxygen available and suction available   Procedure details (see MAR for exact dosages):    Preoxygenation:  Nasal cannula   Sedation:  Propofol    Intended level of sedation: deep   Intra-procedure monitoring:  Blood pressure monitoring, cardiac monitor, continuous pulse oximetry, frequent LOC assessments, frequent vital sign checks and continuous capnometry   Intra-procedure events: none     Total Provider sedation time (minutes):  30 Post-procedure details:   A post-sedation assessment was completed following the completion of the procedure.   Attendance: Constant attendance by certified staff until patient recovered     Recovery: Patient returned to pre-procedure baseline     Post-sedation assessments completed and reviewed: airway patency, cardiovascular function, hydration status, mental status, nausea/vomiting, pain level, respiratory function and temperature     Patient is stable for discharge or admission: yes      Procedure completion:  Tolerated well, no immediate complications .Reduction of dislocation  Date/Time: 04/12/2024 3:16 PM  Performed by: Laurice Maude BROCKS, MD Authorized by: Laurice Maude BROCKS, MD  Consent: Verbal consent obtained Risks and benefits: risks, benefits and alternatives were discussed Consent given by: patient Imaging studies: imaging studies available Required items: required blood products, implants, devices, and special equipment available Patient identity confirmed: verbally with patient and arm band Time out: Immediately prior to procedure a time out was called to verify the correct patient, procedure, equipment, support staff and site/side marked as required. Preparation: Patient was prepped and draped in the usual sterile fashion. Local anesthesia used: no  Anesthesia: Local anesthesia used: no  Sedation: Patient sedated: yes Sedatives: propofol   Patient tolerance: patient tolerated the procedure well with no immediate complications Comments: Left hip reduced without difficulty      Medications Ordered in the ED  morphine  (PF) 4 MG/ML injection 4 mg (has no administration in time range)  ondansetron  (ZOFRAN ) injection 4 mg (has no administration in time range)                                    Medical Decision Making Patient presents with left hip dislocation.  Her left hip was revised on September 11 with Dr. Cherylene.  Dr. Barton, Ortho is aware of case.  Agrees with plan to reduce in the ED.  Patient was sedated with propofol  without difficulty.  Left hip was reduced without difficulty.  Patient understands need for close follow-up with Dr. Cherylene.  Strict return precautions given understood.  Amount and/or Complexity of Data Reviewed Radiology: ordered.  Risk Prescription drug management.        Final diagnoses:  Closed dislocation of left hip, initial encounter [facility])    ED Discharge Orders     None           Laurice Maude BROCKS, MD 04/12/24 [phone]

## 2024-04-24 ENCOUNTER — Ambulatory Visit: Attending: Internal Medicine | Admitting: Internal Medicine

## 2024-04-25 ENCOUNTER — Encounter: Payer: Self-pay | Admitting: Internal Medicine

## 2024-05-17 ENCOUNTER — Ambulatory Visit: Payer: Self-pay

## 2024-05-17 ENCOUNTER — Other Ambulatory Visit: Payer: Self-pay | Admitting: Internal Medicine

## 2024-05-17 NOTE — Telephone Encounter (Signed)
 FYI Only or Action Required?: FYI only for provider: refused appointment.  Patient was last seen in primary care on 11/29/2023 by Plotnikov, Karlynn GAILS, MD.  Called Nurse Triage reporting Edema.  Symptoms began Chronic.  Interventions attempted: Prescription medications: diuretic.  Symptoms are: gradually improving.  Triage Disposition: See Physician Within 24 Hours  Patient/caregiver understands and will follow disposition?: No   Copied from CRM #[id number]. Topic: Clinical - Red Word Triage >> May 17, 2024 11:32 AM China J wrote: [facility] that prompted transfer to Nurse Triage: Patient is needing an appointment for a flu shot but she had also mentioned swelling on both legs and hip that has popped out of place. Reason for Disposition  [1] MODERATE leg swelling (e.g., swelling extends up to knees) AND [2] new-onset or getting worse  Answer Assessment - Initial Assessment Questions Calling to schedule flu vaccine. Patient was transferred to this triage nurse for leg edema and hip issue.  Patient reports her leg edema and hip issue is chronic, nothing new, and no concerning symptoms today.  Requesting to scheduled flu vaccine on 06/14/24-scheduled.  She will call back for any worsening or new symptoms.    1. ONSET: When did the swelling start? (e.g., minutes, hours, days)     chronic 2. LOCATION: What part of the leg is swollen?  Are both legs swollen or just one leg?     bilateral 3. SEVERITY: How bad is the swelling? (e.g., localized; mild, moderate, severe)     moderate 4. REDNESS: Is there redness or signs of infection?     denies 5. PAIN: Is the swelling painful to touch? If Yes, ask: How painful is it?   (Scale 1-10; mild, moderate or severe)     denies 6. FEVER: Do you have a fever? If Yes, ask: What is it, how was it measured, and when did it start?      denies 7. CAUSE: What do you think is causing the leg swelling?     chronic 8. MEDICAL HISTORY: Do  you have a history of blood clots (e.g., DVT), cancer, heart failure, kidney disease, or liver failure?      9. RECURRENT SYMPTOM: Have you had leg swelling before? If Yes, ask: When was the last time? What happened that time?     Yes-uses diuretic and potassium 10. OTHER SYMPTOMS: Do you have any other symptoms? (e.g., chest pain, difficulty breathing)       Denies  11. PREGNANCY: Is there any chance you are pregnant? When was your last menstrual period?  Protocols used: Leg Swelling and Edema-A-AH

## 2024-05-21 MED ORDER — POTASSIUM CHLORIDE CRYS ER 10 MEQ PO TBCR: 10.0000 meq | EXTENDED_RELEASE_TABLET | Freq: Every day | ORAL | 3 refills | Status: AC

## 2024-05-21 MED ORDER — FUROSEMIDE 20 MG PO TABS: ORAL_TABLET | ORAL | 1 refills | Status: AC

## 2024-05-24 DIAGNOSIS — Z5181 Encounter for therapeutic drug level monitoring: Secondary | ICD-10-CM | POA: Diagnosis not present

## 2024-05-24 DIAGNOSIS — M5136 Other intervertebral disc degeneration, lumbar region with discogenic back pain only: Secondary | ICD-10-CM | POA: Diagnosis not present

## 2024-05-24 DIAGNOSIS — M47816 Spondylosis without myelopathy or radiculopathy, lumbar region: Secondary | ICD-10-CM | POA: Diagnosis not present

## 2024-05-25 ENCOUNTER — Telehealth: Payer: Self-pay

## 2024-05-25 NOTE — Telephone Encounter (Signed)
 Copied from CRM #[id number]. Topic: Appointments - Appointment Scheduling >> May 25, 2024  8:56 AM Rea ORN wrote: Pt would like to see PCP sooner than 1/28. She has concerns about hip replacement issues. Please call back to advise if there is sooner availability. Pt would like to be seen 12/31 on the day she has flu vaccine.  Please call [phone]

## 2024-05-26 NOTE — Telephone Encounter (Signed)
 Ok 12/31 if we are open Thx

## 2024-05-30 ENCOUNTER — Telehealth: Payer: Self-pay

## 2024-05-30 ENCOUNTER — Other Ambulatory Visit: Payer: Self-pay | Admitting: Internal Medicine

## 2024-05-30 NOTE — Telephone Encounter (Signed)
 Copied from CRM #[id number]. Topic: Clinical - Medication Refill >> May 30, 2024  2:53 PM Pinkey ORN wrote: Medication: Vitamin D , Ergocalciferol , (DRISDOL ) 1.25 MG (50000 UNIT) CAPS capsule  Has the patient contacted their pharmacy? Yes (Agent: If no, request that the patient contact the pharmacy for the refill. If patient does not wish to contact the pharmacy document the reason why and proceed with request.) (Agent: If yes, when and what did the pharmacy advise?)  This is the patient's preferred pharmacy:  [facility] - [place], [place] - [address] [place] [address] Phone: [phone] Fax: [phone]   Is this the correct pharmacy for this prescription? Yes If no, delete pharmacy and type the correct one.   Has the prescription been filled recently? No  Is the patient out of the medication? Yes  Has the patient been seen for an appointment in the last year OR does the patient have an upcoming appointment? Yes  Can we respond through MyChart? Yes  Agent: Please be advised that Rx refills may take up to 3 business days. We ask that you follow-up with your pharmacy.

## 2024-05-30 NOTE — Telephone Encounter (Signed)
 Copied from CRM #[id number]. Topic: Clinical - Medication Question >> May 30, 2024  2:47 PM Pinkey ORN wrote: Reason for CRM: Medication Question >> May 30, 2024  2:52 PM Pinkey ORN wrote: Patient called in, states she requested a refill be put in for her Phentermine  HCL 37.5 MG . States it's a pill to help purge her appetite.

## 2024-05-31 MED ORDER — PHENTERMINE HCL 37.5 MG PO TABS: 37.5000 mg | ORAL_TABLET | Freq: Every day | ORAL | 1 refills | Status: AC

## 2024-05-31 NOTE — Addendum Note (Signed)
 Addended by: Ewald Beg V on: 05/31/2024 11:35 PM   Modules accepted: Orders

## 2024-05-31 NOTE — Telephone Encounter (Signed)
 Okay.  See me in 1-2 months.  Thanks

## 2024-06-01 MED ORDER — VITAMIN D (ERGOCALCIFEROL) 1.25 MG (50000 UNIT) PO CAPS: 50000.0000 [IU] | ORAL_CAPSULE | ORAL | 3 refills | Status: AC

## 2024-06-14 ENCOUNTER — Ambulatory Visit: Admitting: Internal Medicine

## 2024-06-14 ENCOUNTER — Ambulatory Visit

## 2024-06-23 ENCOUNTER — Ambulatory Visit

## 2024-06-23 DIAGNOSIS — Z23 Encounter for immunization: Secondary | ICD-10-CM

## 2024-06-23 NOTE — Progress Notes (Signed)
 After obtaining consent, and per orders of Dr. Garald, injection of HD Flu given by Edsel CHRISTELLA Kerns. Patient tolerated well.

## 2024-07-04 ENCOUNTER — Other Ambulatory Visit: Payer: Self-pay | Admitting: Internal Medicine

## 2024-07-12 ENCOUNTER — Ambulatory Visit: Admitting: Internal Medicine

## 2024-07-18 ENCOUNTER — Ambulatory Visit: Admitting: Internal Medicine

## 2024-08-09 ENCOUNTER — Ambulatory Visit: Admitting: Internal Medicine
